# Patient Record
Sex: Female | Born: 1950 | Race: Black or African American | Hispanic: No | Marital: Married | Smoking: Never smoker
Health system: Southern US, Community
[De-identification: ages and names within clinical notes are randomized; demographics above are authoritative.]

## PROBLEM LIST (undated history)

## (undated) DIAGNOSIS — K469 Unspecified abdominal hernia without obstruction or gangrene: Secondary | ICD-10-CM

## (undated) DIAGNOSIS — I10 Essential (primary) hypertension: Secondary | ICD-10-CM

## (undated) DIAGNOSIS — R7303 Prediabetes: Secondary | ICD-10-CM

## (undated) DIAGNOSIS — M1990 Unspecified osteoarthritis, unspecified site: Secondary | ICD-10-CM

## (undated) DIAGNOSIS — E876 Hypokalemia: Secondary | ICD-10-CM

## (undated) DIAGNOSIS — J189 Pneumonia, unspecified organism: Secondary | ICD-10-CM

## (undated) DIAGNOSIS — M2550 Pain in unspecified joint: Secondary | ICD-10-CM

## (undated) DIAGNOSIS — M19019 Primary osteoarthritis, unspecified shoulder: Secondary | ICD-10-CM

## (undated) DIAGNOSIS — K219 Gastro-esophageal reflux disease without esophagitis: Secondary | ICD-10-CM

## (undated) DIAGNOSIS — T7840XA Allergy, unspecified, initial encounter: Secondary | ICD-10-CM

## (undated) DIAGNOSIS — R351 Nocturia: Secondary | ICD-10-CM

## (undated) DIAGNOSIS — E7800 Pure hypercholesterolemia, unspecified: Secondary | ICD-10-CM

## (undated) HISTORY — PX: CHOLECYSTECTOMY: SHX55

## (undated) HISTORY — DX: Pure hypercholesterolemia, unspecified: E78.00

## (undated) HISTORY — PX: TUBAL LIGATION: SHX77

## (undated) HISTORY — DX: Essential (primary) hypertension: I10

## (undated) HISTORY — PX: HERNIA REPAIR: SHX51

## (undated) HISTORY — PX: DILATION AND CURETTAGE OF UTERUS: SHX78

## (undated) HISTORY — DX: Allergy, unspecified, initial encounter: T78.40XA

## (undated) HISTORY — PX: JOINT REPLACEMENT: SHX530

## (undated) HISTORY — PX: COLONOSCOPY: SHX174

---

## 1998-10-20 ENCOUNTER — Other Ambulatory Visit: Admission: RE | Admit: 1998-10-20 | Discharge: 1998-10-20 | Payer: Self-pay | Admitting: Nurse Practitioner

## 2000-07-04 ENCOUNTER — Other Ambulatory Visit: Admission: RE | Admit: 2000-07-04 | Discharge: 2000-07-04 | Payer: Self-pay | Admitting: Endocrinology

## 2000-12-27 ENCOUNTER — Ambulatory Visit (HOSPITAL_COMMUNITY): Admission: RE | Admit: 2000-12-27 | Discharge: 2000-12-27 | Payer: Self-pay | Admitting: Endocrinology

## 2000-12-27 ENCOUNTER — Encounter: Payer: Self-pay | Admitting: Endocrinology

## 2001-08-04 ENCOUNTER — Other Ambulatory Visit: Admission: RE | Admit: 2001-08-04 | Discharge: 2001-08-04 | Payer: Self-pay | Admitting: Endocrinology

## 2002-01-08 ENCOUNTER — Encounter: Payer: Self-pay | Admitting: Endocrinology

## 2002-01-08 ENCOUNTER — Ambulatory Visit (HOSPITAL_COMMUNITY): Admission: RE | Admit: 2002-01-08 | Discharge: 2002-01-08 | Payer: Self-pay | Admitting: Endocrinology

## 2002-10-07 ENCOUNTER — Other Ambulatory Visit: Admission: RE | Admit: 2002-10-07 | Discharge: 2002-10-07 | Payer: Self-pay | Admitting: Endocrinology

## 2003-01-21 ENCOUNTER — Ambulatory Visit (HOSPITAL_COMMUNITY): Admission: RE | Admit: 2003-01-21 | Discharge: 2003-01-21 | Payer: Self-pay | Admitting: Endocrinology

## 2003-01-21 ENCOUNTER — Encounter: Payer: Self-pay | Admitting: Endocrinology

## 2004-01-20 ENCOUNTER — Other Ambulatory Visit: Admission: RE | Admit: 2004-01-20 | Discharge: 2004-01-20 | Payer: Self-pay | Admitting: Obstetrics and Gynecology

## 2004-04-17 ENCOUNTER — Ambulatory Visit: Payer: Self-pay | Admitting: Endocrinology

## 2004-06-28 ENCOUNTER — Ambulatory Visit: Payer: Self-pay | Admitting: Endocrinology

## 2004-12-26 ENCOUNTER — Ambulatory Visit: Payer: Self-pay | Admitting: Endocrinology

## 2005-01-03 ENCOUNTER — Ambulatory Visit: Payer: Self-pay | Admitting: Endocrinology

## 2005-02-09 ENCOUNTER — Other Ambulatory Visit: Admission: RE | Admit: 2005-02-09 | Discharge: 2005-02-09 | Payer: Self-pay | Admitting: Obstetrics and Gynecology

## 2005-04-03 ENCOUNTER — Ambulatory Visit: Payer: Self-pay | Admitting: Endocrinology

## 2005-04-18 ENCOUNTER — Ambulatory Visit: Payer: Self-pay | Admitting: Endocrinology

## 2005-04-23 ENCOUNTER — Ambulatory Visit: Payer: Self-pay | Admitting: Endocrinology

## 2005-05-01 ENCOUNTER — Ambulatory Visit: Payer: Self-pay | Admitting: Internal Medicine

## 2005-05-21 ENCOUNTER — Ambulatory Visit (HOSPITAL_COMMUNITY): Admission: RE | Admit: 2005-05-21 | Discharge: 2005-05-21 | Payer: Self-pay | Admitting: Endocrinology

## 2005-07-27 ENCOUNTER — Ambulatory Visit: Payer: Self-pay | Admitting: Internal Medicine

## 2005-07-31 ENCOUNTER — Ambulatory Visit (HOSPITAL_COMMUNITY): Admission: RE | Admit: 2005-07-31 | Discharge: 2005-07-31 | Payer: Self-pay | Admitting: Internal Medicine

## 2005-10-29 ENCOUNTER — Inpatient Hospital Stay (HOSPITAL_COMMUNITY): Admission: RE | Admit: 2005-10-29 | Discharge: 2005-10-31 | Payer: Self-pay | Admitting: Surgery

## 2005-10-29 ENCOUNTER — Encounter (INDEPENDENT_AMBULATORY_CARE_PROVIDER_SITE_OTHER): Payer: Self-pay | Admitting: *Deleted

## 2005-11-01 ENCOUNTER — Ambulatory Visit: Payer: Self-pay | Admitting: Internal Medicine

## 2006-03-13 ENCOUNTER — Ambulatory Visit: Payer: Self-pay | Admitting: Endocrinology

## 2006-04-15 ENCOUNTER — Ambulatory Visit: Payer: Self-pay | Admitting: Endocrinology

## 2006-05-10 ENCOUNTER — Ambulatory Visit: Payer: Self-pay | Admitting: Endocrinology

## 2006-09-27 ENCOUNTER — Ambulatory Visit: Payer: Self-pay | Admitting: Endocrinology

## 2006-10-14 ENCOUNTER — Ambulatory Visit: Payer: Self-pay | Admitting: Gastroenterology

## 2006-10-28 ENCOUNTER — Ambulatory Visit: Payer: Self-pay | Admitting: Gastroenterology

## 2006-10-28 LAB — HM COLONOSCOPY

## 2007-01-03 ENCOUNTER — Encounter: Payer: Self-pay | Admitting: Endocrinology

## 2007-01-03 DIAGNOSIS — J309 Allergic rhinitis, unspecified: Secondary | ICD-10-CM | POA: Insufficient documentation

## 2007-01-03 DIAGNOSIS — I10 Essential (primary) hypertension: Secondary | ICD-10-CM

## 2007-01-03 HISTORY — DX: Essential (primary) hypertension: I10

## 2007-02-10 LAB — HM MAMMOGRAPHY: HM Mammogram: NORMAL

## 2007-03-21 ENCOUNTER — Ambulatory Visit: Payer: Self-pay | Admitting: Endocrinology

## 2007-03-21 LAB — CONVERTED CEMR LAB
ALT: 26 units/L (ref 0–35)
AST: 30 units/L (ref 0–37)
Albumin: 3.5 g/dL (ref 3.5–5.2)
Alkaline Phosphatase: 57 units/L (ref 39–117)
BUN: 8 mg/dL (ref 6–23)
Basophils Absolute: 0 10*3/uL (ref 0.0–0.1)
Basophils Relative: 0.6 % (ref 0.0–1.0)
Bilirubin Urine: NEGATIVE
Bilirubin, Direct: 0.1 mg/dL (ref 0.0–0.3)
CO2: 32 meq/L (ref 19–32)
Calcium: 9.5 mg/dL (ref 8.4–10.5)
Chloride: 103 meq/L (ref 96–112)
Cholesterol: 199 mg/dL (ref 0–200)
Creatinine, Ser: 0.7 mg/dL (ref 0.4–1.2)
Eosinophils Absolute: 0.1 10*3/uL (ref 0.0–0.6)
Eosinophils Relative: 1.1 % (ref 0.0–5.0)
GFR calc Af Amer: 112 mL/min
GFR calc non Af Amer: 93 mL/min
Glucose, Bld: 98 mg/dL (ref 70–99)
HCT: 37.3 % (ref 36.0–46.0)
HDL: 51.5 mg/dL (ref >39.0)
Hemoglobin, Urine: NEGATIVE
Hemoglobin: 13 g/dL (ref 12.0–15.0)
Hgb A1c MFr Bld: 5.6 % (ref 4.6–6.0)
Ketones, ur: NEGATIVE mg/dL
LDL Cholesterol: 135 mg/dL — ABNORMAL HIGH (ref 0–99)
Leukocytes, UA: NEGATIVE
Lymphocytes Relative: 20.8 % (ref 12.0–46.0)
MCHC: 34.8 g/dL (ref 30.0–36.0)
MCV: 89.7 fL (ref 78.0–100.0)
Monocytes Absolute: 0.4 10*3/uL (ref 0.2–0.7)
Monocytes Relative: 6.4 % (ref 3.0–11.0)
Neutro Abs: 4.6 10*3/uL (ref 1.4–7.7)
Neutrophils Relative %: 71.1 % (ref 43.0–77.0)
Nitrite: NEGATIVE
Platelets: 282 10*3/uL (ref 150–400)
Potassium: 3.3 meq/L — ABNORMAL LOW (ref 3.5–5.1)
RBC: 4.16 M/uL (ref 3.87–5.11)
RDW: 13.2 % (ref 11.5–14.6)
Sodium: 140 meq/L (ref 135–145)
Specific Gravity, Urine: <=1.005 (ref 1.000–1.03)
TSH: 1.27 microintl units/mL (ref 0.35–5.50)
Total Bilirubin: 1 mg/dL (ref 0.3–1.2)
Total CHOL/HDL Ratio: 3.9
Total Protein, Urine: NEGATIVE mg/dL
Total Protein: 7 g/dL (ref 6.0–8.3)
Triglycerides: 62 mg/dL (ref 0–149)
Urine Glucose: NEGATIVE mg/dL
Urobilinogen, UA: 0.2 (ref 0.0–1.0)
VLDL: 12 mg/dL (ref 0–40)
WBC: 6.4 10*3/uL (ref 4.5–10.5)
pH: 7 (ref 5.0–8.0)

## 2007-03-25 ENCOUNTER — Ambulatory Visit: Payer: Self-pay | Admitting: Endocrinology

## 2007-04-23 ENCOUNTER — Telehealth (INDEPENDENT_AMBULATORY_CARE_PROVIDER_SITE_OTHER): Payer: Self-pay | Admitting: *Deleted

## 2007-04-25 ENCOUNTER — Ambulatory Visit: Payer: Self-pay | Admitting: Endocrinology

## 2007-04-25 DIAGNOSIS — E7800 Pure hypercholesterolemia, unspecified: Secondary | ICD-10-CM

## 2007-04-25 DIAGNOSIS — R42 Dizziness and giddiness: Secondary | ICD-10-CM | POA: Insufficient documentation

## 2007-04-25 HISTORY — DX: Pure hypercholesterolemia, unspecified: E78.00

## 2007-05-01 ENCOUNTER — Ambulatory Visit: Payer: Self-pay

## 2007-05-01 ENCOUNTER — Encounter: Payer: Self-pay | Admitting: Endocrinology

## 2007-09-29 ENCOUNTER — Encounter: Payer: Self-pay | Admitting: Endocrinology

## 2007-10-02 ENCOUNTER — Encounter: Payer: Self-pay | Admitting: Endocrinology

## 2007-10-13 ENCOUNTER — Encounter: Payer: Self-pay | Admitting: Endocrinology

## 2007-10-30 ENCOUNTER — Encounter: Payer: Self-pay | Admitting: Endocrinology

## 2008-03-09 ENCOUNTER — Encounter: Payer: Self-pay | Admitting: Endocrinology

## 2008-04-13 ENCOUNTER — Telehealth (INDEPENDENT_AMBULATORY_CARE_PROVIDER_SITE_OTHER): Payer: Self-pay | Admitting: *Deleted

## 2008-06-11 HISTORY — PX: REPLACEMENT TOTAL KNEE BILATERAL: SUR1225

## 2008-08-06 ENCOUNTER — Ambulatory Visit: Payer: Self-pay | Admitting: Endocrinology

## 2008-08-08 LAB — CONVERTED CEMR LAB
ALT: 20 units/L (ref 0–35)
AST: 24 units/L (ref 0–37)
Albumin: 3.6 g/dL (ref 3.5–5.2)
Alkaline Phosphatase: 59 units/L (ref 39–117)
BUN: 8 mg/dL (ref 6–23)
Basophils Absolute: 0.1 10*3/uL (ref 0.0–0.1)
Basophils Relative: 0.8 % (ref 0.0–3.0)
Bilirubin Urine: NEGATIVE
Bilirubin, Direct: 0.2 mg/dL (ref 0.0–0.3)
CO2: 30 meq/L (ref 19–32)
Calcium: 9.4 mg/dL (ref 8.4–10.5)
Chloride: 103 meq/L (ref 96–112)
Cholesterol: 161 mg/dL (ref 0–200)
Creatinine, Ser: 0.8 mg/dL (ref 0.4–1.2)
Eosinophils Absolute: 0.1 10*3/uL (ref 0.0–0.7)
Eosinophils Relative: 1.9 % (ref 0.0–5.0)
GFR calc Af Amer: 95 mL/min
GFR calc non Af Amer: 79 mL/min
Glucose, Bld: 112 mg/dL — ABNORMAL HIGH (ref 70–99)
HCT: 37.8 % (ref 36.0–46.0)
HDL: 59 mg/dL (ref >39.0)
Hemoglobin, Urine: NEGATIVE
Hemoglobin: 12.9 g/dL (ref 12.0–15.0)
Ketones, ur: NEGATIVE mg/dL
LDL Cholesterol: 91 mg/dL (ref 0–99)
Leukocytes, UA: NEGATIVE
Lymphocytes Relative: 18.4 % (ref 12.0–46.0)
MCHC: 34.1 g/dL (ref 30.0–36.0)
MCV: 86.9 fL (ref 78.0–100.0)
Monocytes Absolute: 0.5 10*3/uL (ref 0.1–1.0)
Monocytes Relative: 7.2 % (ref 3.0–12.0)
Neutro Abs: 4.4 10*3/uL (ref 1.4–7.7)
Neutrophils Relative %: 71.7 % (ref 43.0–77.0)
Nitrite: NEGATIVE
Platelets: 237 10*3/uL (ref 150–400)
Potassium: 3.7 meq/L (ref 3.5–5.1)
RBC: 4.35 M/uL (ref 3.87–5.11)
RDW: 15.1 % — ABNORMAL HIGH (ref 11.5–14.6)
Sodium: 139 meq/L (ref 135–145)
Specific Gravity, Urine: <=1.005 (ref 1.000–1.035)
TSH: 1.52 microintl units/mL (ref 0.35–5.50)
Total Bilirubin: 1 mg/dL (ref 0.3–1.2)
Total CHOL/HDL Ratio: 2.7
Total Protein, Urine: NEGATIVE mg/dL
Total Protein: 7.1 g/dL (ref 6.0–8.3)
Triglycerides: 53 mg/dL (ref 0–149)
Urine Glucose: NEGATIVE mg/dL
Urobilinogen, UA: 0.2 (ref 0.0–1.0)
VLDL: 11 mg/dL (ref 0–40)
WBC: 6.3 10*3/uL (ref 4.5–10.5)
pH: 6.5 (ref 5.0–8.0)

## 2008-08-12 ENCOUNTER — Ambulatory Visit: Payer: Self-pay | Admitting: Endocrinology

## 2008-08-12 DIAGNOSIS — R7309 Other abnormal glucose: Secondary | ICD-10-CM | POA: Insufficient documentation

## 2008-10-29 ENCOUNTER — Telehealth (INDEPENDENT_AMBULATORY_CARE_PROVIDER_SITE_OTHER): Payer: Self-pay | Admitting: *Deleted

## 2009-04-11 LAB — CONVERTED CEMR LAB

## 2009-04-11 LAB — HM MAMMOGRAPHY

## 2009-04-19 ENCOUNTER — Encounter: Admission: RE | Admit: 2009-04-19 | Discharge: 2009-04-19 | Payer: Self-pay | Admitting: Obstetrics and Gynecology

## 2009-06-11 LAB — HM MAMMOGRAPHY: HM Mammogram: NORMAL

## 2009-07-12 ENCOUNTER — Ambulatory Visit: Payer: Self-pay | Admitting: Endocrinology

## 2009-12-27 ENCOUNTER — Telehealth: Payer: Self-pay | Admitting: Endocrinology

## 2010-01-13 ENCOUNTER — Ambulatory Visit: Payer: Self-pay | Admitting: Endocrinology

## 2010-01-13 LAB — CONVERTED CEMR LAB
ALT: 18 units/L (ref 0–35)
AST: 22 units/L (ref 0–37)
Albumin: 3.8 g/dL (ref 3.5–5.2)
Alkaline Phosphatase: 75 units/L (ref 39–117)
BUN: 10 mg/dL (ref 6–23)
Basophils Absolute: 0 10*3/uL (ref 0.0–0.1)
Basophils Relative: 0.5 % (ref 0.0–3.0)
Bilirubin Urine: NEGATIVE
Bilirubin, Direct: 0.1 mg/dL (ref 0.0–0.3)
CO2: 32 meq/L (ref 19–32)
Calcium: 9.7 mg/dL (ref 8.4–10.5)
Chloride: 105 meq/L (ref 96–112)
Cholesterol: 173 mg/dL (ref 0–200)
Creatinine, Ser: 0.6 mg/dL (ref 0.4–1.2)
Eosinophils Absolute: 0.1 10*3/uL (ref 0.0–0.7)
Eosinophils Relative: 2.1 % (ref 0.0–5.0)
GFR calc non Af Amer: 122.8 mL/min (ref >60)
Glucose, Bld: 107 mg/dL — ABNORMAL HIGH (ref 70–99)
HCT: 41.8 % (ref 36.0–46.0)
HDL: 58.4 mg/dL (ref >39.00)
Hemoglobin, Urine: NEGATIVE
Hemoglobin: 14.4 g/dL (ref 12.0–15.0)
Ketones, ur: NEGATIVE mg/dL
LDL Cholesterol: 96 mg/dL (ref 0–99)
Leukocytes, UA: NEGATIVE
Lymphocytes Relative: 26.6 % (ref 12.0–46.0)
Lymphs Abs: 1.6 10*3/uL (ref 0.7–4.0)
MCHC: 34.3 g/dL (ref 30.0–36.0)
MCV: 92.4 fL (ref 78.0–100.0)
Monocytes Absolute: 0.5 10*3/uL (ref 0.1–1.0)
Monocytes Relative: 7.9 % (ref 3.0–12.0)
Neutro Abs: 3.8 10*3/uL (ref 1.4–7.7)
Neutrophils Relative %: 62.9 % (ref 43.0–77.0)
Nitrite: NEGATIVE
PSA: 0 ng/mL — ABNORMAL LOW (ref 0.10–4.00)
Platelets: 225 10*3/uL (ref 150.0–400.0)
Potassium: 4 meq/L (ref 3.5–5.1)
RBC: 4.53 M/uL (ref 3.87–5.11)
RDW: 13.4 % (ref 11.5–14.6)
Sodium: 144 meq/L (ref 135–145)
Specific Gravity, Urine: 1.02 (ref 1.000–1.030)
TSH: 1.77 microintl units/mL (ref 0.35–5.50)
Total Bilirubin: 1.2 mg/dL (ref 0.3–1.2)
Total CHOL/HDL Ratio: 3
Total Protein, Urine: NEGATIVE mg/dL
Total Protein: 7.1 g/dL (ref 6.0–8.3)
Triglycerides: 93 mg/dL (ref 0.0–149.0)
Urine Glucose: NEGATIVE mg/dL
Urobilinogen, UA: 0.2 (ref 0.0–1.0)
VLDL: 18.6 mg/dL (ref 0.0–40.0)
WBC: 6 10*3/uL (ref 4.5–10.5)
pH: 6.5 (ref 5.0–8.0)

## 2010-01-16 ENCOUNTER — Ambulatory Visit: Payer: Self-pay | Admitting: Endocrinology

## 2010-01-16 ENCOUNTER — Encounter: Payer: Self-pay | Admitting: Endocrinology

## 2010-05-09 ENCOUNTER — Encounter: Admission: RE | Admit: 2010-05-09 | Discharge: 2010-05-09 | Payer: Self-pay | Admitting: Obstetrics and Gynecology

## 2010-07-02 ENCOUNTER — Encounter: Payer: Self-pay | Admitting: Obstetrics and Gynecology

## 2010-07-09 LAB — CONVERTED CEMR LAB
ALT: 25 units/L (ref 0–35)
AST: 28 units/L (ref 0–37)
Albumin: 3.6 g/dL (ref 3.5–5.2)
Alkaline Phosphatase: 56 units/L (ref 39–117)
Bilirubin, Direct: 0.1 mg/dL (ref 0.0–0.3)
Cholesterol: 150 mg/dL (ref 0–200)
HDL: 49.7 mg/dL (ref >39.0)
LDL Cholesterol: 83 mg/dL (ref 0–99)
Pap Smear: NORMAL
Pap Smear: NORMAL
Sed Rate: 18 mm/hr (ref 0–25)
Total Bilirubin: 0.7 mg/dL (ref 0.3–1.2)
Total CHOL/HDL Ratio: 3
Total Protein: 7.4 g/dL (ref 6.0–8.3)
Triglycerides: 87 mg/dL (ref 0–149)
Uric Acid, Serum: 7.2 mg/dL — ABNORMAL HIGH (ref 2.4–7.0)
VLDL: 17 mg/dL (ref 0–40)

## 2010-07-11 NOTE — Assessment & Plan Note (Signed)
Summary: SINUS INFECTION / NWS #   Vital Signs:  Patient profile:   60 year old female Height:      64 inches (162.56 cm) Weight:      217 pounds (98.64 kg) BMI:     37.38 O2 Sat:      97 % on Room air Temp:     96.6 degrees F (35.89 degrees C) oral Pulse rate:   85 / minute BP sitting:   112 / 72  (left arm) Cuff size:   large  Vitals Entered By: Josph Macho CMA (July 12, 2009 8:02 AM)  O2 Flow:  Room air CC: Sinus infection X2weeks/ CF Is Patient Diabetic? No   CC:  Sinus infection X2weeks/ CF.  History of Present Illness: pt states 2 weeks of nasal congestion, and associated prod-quality cough.  she denies associated pain at the ears.    Current Medications (verified): 1)  Nasonex 50 Mcg/act  Susp (Mometasone Furoate) .... Use 2 Spray in Each Nostril 2)  Pravachol 40 Mg  Tabs (Pravastatin Sodium) .... Take 1 By Mouth Qhs 3)  Norvasc 2.5 Mg  Tabs (Amlodipine Besylate) .... Qd 4)  Hyzaar 100-25 Mg Tabs (Losartan Potassium-Hctz) .... Qd  Allergies (verified): No Known Drug Allergies  Past History:  Past Medical History: Last updated: 01/03/2007 Allergic rhinitis Hypertension Cough due to accupril Dyslipidemia ( Rx refused) Hyperglycemia  Review of Systems  The patient denies fever.    Physical Exam  General:  normal appearance.     Impression & Recommendations:  Problem # 1:  URI (ICD-465.9) Assessment New  Medications Added to Medication List This Visit: 1)  Azithromycin 500 Mg Tabs (Azithromycin) .Marland Kitchen.. 1 qd 2)  Benzonatate 100 Mg Caps (Benzonatate) .Marland Kitchen.. 1 tab three times a day as needed cough  Other Orders: Est. Patient Level III (84696)  Patient Instructions: 1)  azithromycin 500 mg once daily 2)  loratadine-d (non-prescription) as needed for congestion 3)  benzonatate 100 mg three times a day as needed cough. 4)  physical in 2 months Prescriptions: BENZONATATE 100 MG CAPS (BENZONATATE) 1 tab three times a day as needed cough  #30 x  1   Entered and Authorized by:   Minus Breeding MD   Signed by:   Minus Breeding MD on 07/12/2009   Method used:   [facility] [address] [phone][facility] (retail)       [address]       [place], [place]  [address]       Ph: [phone] or [phone]       Fax: [phone]   RxID:   [id number] AZITHROMYCIN 500 MG TABS (AZITHROMYCIN) 1 qd  #6 x 0   Entered and Authorized by:   Minus Breeding MD   Signed by:   Minus Breeding MD on 07/12/2009   Method used:   [facility]        [facility] [address] [phone]* (retail)       [address]       [place], [place]  [address]       Ph: [phone] or [phone]       Fax: [phone]   RxID:   [id number]   Preventive Care Screening  Mammogram:    Date:  04/11/2009    Results:  historical   Pap Smear:  Date:  04/11/2009    Results:  historical

## 2010-07-11 NOTE — Assessment & Plan Note (Signed)
Summary: CPX/BCBS/#/CD   Vital Signs:  Patient profile:   60 year old female Height:      64 inches (162.56 cm) Weight:      214 pounds (97.27 kg) BMI:     36.87 O2 Sat:      97 % on Room air Temp:     97.5 degrees F (36.39 degrees C) oral Pulse rate:   69 / minute BP sitting:   122 / 76  (left arm) Cuff size:   large  Vitals Entered By: Brenton Grills MA (January 16, 2010 10:03 AM)  O2 Flow:  Room air CC: Physical/pt is no longer taking Azithromycin or Benzonatate/aj   CC:  Physical/pt is no longer taking Azithromycin or Benzonatate/aj.  History of Present Illness: here for regular wellness examination.  she's feeling pretty well in general, and does not drink or smoke.   Current Medications (verified): 1)  Nasonex 50 Mcg/act  Susp (Mometasone Furoate) .... Use 2 Spray in Each Nostril 2)  Pravachol 40 Mg  Tabs (Pravastatin Sodium) .... Take 1 By Mouth Qhs 3)  Norvasc 2.5 Mg  Tabs (Amlodipine Besylate) .... Qd 4)  Hyzaar 100-25 Mg Tabs (Losartan Potassium-Hctz) .... Qd 5)  Azithromycin 500 Mg Tabs (Azithromycin) .Marland Kitchen.. 1 Qd 6)  Benzonatate 100 Mg Caps (Benzonatate) .Marland Kitchen.. 1 Tab Three Times A Day As Needed Cough  Allergies (verified): No Known Drug Allergies  Family History: Reviewed history from 08/12/2008 and no changes required. no cancer  Social History: Reviewed history from 08/12/2008 and no changes required. married beautician  Review of Systems  The patient denies fever, weight loss, weight gain, vision loss, chest pain, syncope, dyspnea on exertion, prolonged cough, headaches, abdominal pain, melena, hematochezia, severe indigestion/heartburn, hematuria, suspicious skin lesions, and depression.    Physical Exam  General:  normal appearance.   Head:  head: no deformity eyes: no periorbital swelling, no proptosis external nose and ears are normal mouth: no lesion seen Neck:  Supple without thyroid enlargement or tenderness.  Breasts:  sees gyn  Lungs:   Clear to auscultation bilaterally. Normal respiratory effort.  Heart:  Regular rate and rhythm without murmurs or gallops noted. Normal S1,S2.   Abdomen:  abdomen is soft, nontender.  no hepatosplenomegaly.   not distended.  no hernia  Rectal:  sees gyn  Genitalia:  sees gyn  Msk:  muscle bulk and strength are grossly normal.  no obvious joint swelling.  gait is normal and steady  Pulses:  dorsalis pedis intact bilat.  no carotid bruit  Extremities:  no deformity.  no ulcer on the feet.  feet are of normal color and temp.  no edema  Neurologic:  cn 2-12 grossly intact.   readily moves all 4's.   sensation is intact to touch on the feet  Skin:  normal texture and temp.  no rash.  not diaphoretic  Cervical Nodes:  No significant adenopathy.  Psych:  Alert and cooperative; normal mood and affect; normal attention span and concentration.   Additional Exam:  SEPARATE EVALUATION FOLLOWS--EACH PROBLEM HERE IS NEW, NOT RESPONDING TO TREATMENT, OR POSES SIGNIFICANT RISK TO THE PATIENT'S HEALTH: HISTORY OF THE PRESENT ILLNESS: pt states few days of slightly decreased hearing from the right ear.  no assoc pain PAST MEDICAL HISTORY reviewed and up to date today REVIEW OF SYSTEMS: denies left ear decreased hearing PHYSICAL EXAMINATION: right eac is impacted with cerumen IMPRESSION: right eac cerumen impaction PLAN: right ear is irrigated.  repeat exam shows resolution of  the impaction.    Impression & Recommendations:  Problem # 1:  ROUTINE GENERAL MEDICAL EXAM@HEALTH  CARE FACL (ICD-V70.0)  Other Orders: EKG w/ Interpretation (93000) Est. Patient 40-64 years ([address])  Patient Instructions: 1)  please consider these measures for your health:  minimize alcohol.  do not use tobacco products.  have a colonoscopy at least every 10 years from age 63.  keep firearms safely stored.  always use seat belts.  have working smoke alarms in your home.  see an eye doctor and dentist regularly.   never drive under the influence of alcohol or drugs (including prescription drugs).  2)  please let me know what your wishes would be, if artificial life support measures should become necessary.  it is critically important to prevent falling down (keep floor areas well-lit, dry, and free of loose objects) 3)  (update:  we discussed code status.  pt requests full code, but would not want to be started or maintained on artificial life-support measures if there was not a reasonable chance of recovery) Prescriptions: PRAVACHOL 40 MG  TABS (PRAVASTATIN SODIUM) TAKE 1 by mouth QHS  #90 Tablet x 3   Entered and Authorized by:   Minus Breeding MD   Signed by:   Minus Breeding MD on 01/16/2010   Method used:   [facility]        [facility] [address] [phone]* (retail)       [address]       [place], [place]  [address]       Ph: [phone] or [phone]       Fax: [phone]   RxID:   [id number]    Preventive Care Screening  Mammogram:    Date:  06/11/2009    Results:  normal   Pap Smear:    Date:  06/11/2009    Results:  normal      gyn is dr Edward Jolly

## 2010-07-11 NOTE — Miscellaneous (Signed)
Summary: PRAVASTATIN   Clinical Lists Changes  Medications: Rx of PRAVACHOL 40 MG  TABS (PRAVASTATIN SODIUM) TAKE 1 by mouth QHS;  #90 x 2;  Signed;  Entered by: Orlan Leavens;  Authorized by: Minus Breeding MD;  Method used: Electronic    Prescriptions: PRAVACHOL 40 MG  TABS (PRAVASTATIN SODIUM) TAKE 1 by mouth QHS  #90 x 2   Entered by:   Orlan Leavens   Authorized by:   Minus Breeding MD   Signed by:   Orlan Leavens on 10/30/2007   Method used:   Electronically sent to ...       [facility]. [phone]*       [address]       [place], [place]  [address]       Ph: [phone] or [phone]       Fax: [phone]   RxID:   [id number]

## 2010-07-11 NOTE — Assessment & Plan Note (Signed)
Summary: cpx/bcbs/cd   Vital Signs:  Patient Profile:   60 Years Old Female Weight:      206.4 pounds O2 Sat:      98 % O2 treatment:    Room Air Temp:     98 degrees F oral Pulse rate:   60 / minute BP sitting:   118 / 82  (left arm) Cuff size:   large  Pt. in pain?   no  Vitals Entered By: Iona Hansen CMA (August 12, 2008 9:33 AM)                Last PAP Date 02/10/2007   Chief Complaint:  CPX.  History of Present Illness: here for regular wellness examination.  she's feeling pretty well in general, and does not drink or smoke.  she wants a cheaper bp med.     Prior Medications Reviewed Using: Patient Recall  Prior Medication List:  NASONEX 50 MCG/ACT  SUSP (MOMETASONE FUROATE) USE 2 SPRAY IN EACH NOSTRIL BENICAR HCT 40-25 MG  TABS (OLMESARTAN MEDOXOMIL-HCTZ) TAKE 1 by mouth once daily Physical is over due no addtional refills until appt PRAVACHOL 40 MG  TABS (PRAVASTATIN SODIUM) TAKE 1 by mouth QHS NORVASC 2.5 MG  TABS (AMLODIPINE BESYLATE) qd   Updated Prior Medication List: NASONEX 50 MCG/ACT  SUSP (MOMETASONE FUROATE) USE 2 SPRAY IN EACH NOSTRIL BENICAR HCT 40-25 MG  TABS (OLMESARTAN MEDOXOMIL-HCTZ) TAKE 1 by mouth once daily Physical is over due no addtional refills until appt PRAVACHOL 40 MG  TABS (PRAVASTATIN SODIUM) TAKE 1 by mouth QHS NORVASC 2.5 MG  TABS (AMLODIPINE BESYLATE) qd  Current Allergies (reviewed today): No known allergies   Past Medical History:    Reviewed history from 01/03/2007 and no changes required:       Allergic rhinitis       Hypertension       Cough due to accupril       Dyslipidemia ( Rx refused)       Hyperglycemia   Family History:    Reviewed history and no changes required:       no cancer  Social History:    Reviewed history and no changes required:       married       beautician   Risk Factors:  PAP Smear History:     Date of Last PAP Smear:  08/12/2008    Results:  Normal  Mammogram History:     Date  of Last Mammogram:  02/10/2007    Results:  Normal Bilateral    Review of Systems  The patient denies fever, weight loss, weight gain, vision loss, decreased hearing, chest pain, syncope, dyspnea on exertion, prolonged cough, headaches, abdominal pain, melena, hematochezia, severe indigestion/heartburn, hematuria, suspicious skin lesions, and depression.     Physical Exam  General:     well developed, well nourished, in no acute distress Head:     head: no deformity eyes: no periorbital swelling, no proptosis external nose and ears are normal mouth: no lesion seen  Neck:     no masses, thyromegaly, or abnormal cervical nodes Breasts:     sees gyn  Lungs:     clear bilaterally to A  Heart:     regular rate and rhythm, S1, S2 without murmurs, rubs, gallops, or clicks Abdomen:     abdomen is soft, nontender.  no hepatosplenomegaly.   not distended.  no hernia  Rectal:     sees gyn  Genitalia:  sees gyn  Msk:     no deformity or scoliosis noted with normal posture and gait Pulses:     dorsalis pedis intact bilat.  no carotid bruit  Extremities:     no deformity.  no ulcer on the feet.  feet are of normal color and temp.  no edema there is a bandage on the right great toenail, where pt recently had a toenail removed  Neurologic:     cn 2-12 grossly intact.   readily moves all 4's.   sensation is intact to touch on the feet  Skin:     normal texture and temp.  no rash.  not diaphoretic  Cervical Nodes:     no significant adenopathy Psych:     alert and cooperative; normal mood and affect; normal attention span and concentration    Impression & Recommendations:  Problem # 1:  Preventive Health Care (ICD-V70.0)  Problem # 2:  HYPERTENSION (ICD-401.9) wants cheaper med  Medications Added to Medication List This Visit: 1)  Hyzaar 100-25 Mg Tabs (Losartan potassium-hctz) .... Qd  Other Orders: Est. Patient 40-64 years ([address])   Patient  Instructions: 1)  we discussed the recommendations of the preventive services task force 2)  change benicar-hct to hyzaar 100/25 1/day   Prescriptions: NORVASC 2.5 MG  TABS (AMLODIPINE BESYLATE) qd  #90 Tablet x 3   Entered and Authorized by:   Minus Breeding MD   Signed by:   Minus Breeding MD on 08/12/2008   Method used:   [facility]        [facility] [address] [phone]* (retail)       [address]       [place], [place]  [address]       Ph: [phone] or [phone]       Fax: [phone]   RxID:   [id number]    Preventive Care Screening     gyn is dr Edward Jolly, who does mammography and dexa

## 2010-07-11 NOTE — Progress Notes (Signed)
  Phone Note Call from Patient Call back at Home Phone [phone]   Caller: Patient Call For: Minus Breeding MD Summary of Call: per Sudie Grumbling  call onn 3-4 pt states dr Lorane Gell was going to send in a rx for change benicar-hct to hyzaar 100/25 1/day.Marland KitchenMarland KitchenMarland KitchenNeed chart ASAP/STAT 90 day supply [facility]. [phone]* (retail) [address] [place], [place]  [address] Ph: [phone] or [phone] Fax: [phone] Initial call taken by: Shelbie Proctor,  Oct 29, 2008 4:47 PM  Follow-up for Phone Call        sent Follow-up by: Minus Breeding MD,  Oct 29, 2008 4:53 PM  Additional Follow-up for Phone Call Additional follow up Details #1::        CALLED PT TO INFORM PT MADE AWARE Additional Follow-up by: Shelbie Proctor,  Oct 29, 2008 4:56 PM      Prescriptions: Mauri Reading 100-25 MG TABS (LOSARTAN POTASSIUM-HCTZ) qd  #90 x 3   Entered and Authorized by:   Minus Breeding MD   Signed by:   Minus Breeding MD on 10/29/2008   Method used:   [facility]ically [facility]        [facility] [address] [phone]* (retail)       [address]       [place], [place]  [address]       Ph: [phone] or [phone]       Fax: [phone]   RxID:   [id number]

## 2010-07-11 NOTE — Miscellaneous (Signed)
  Clinical Lists Changes  Medications: Rx of NORVASC 2.5 MG  TABS (AMLODIPINE BESYLATE) qd;  #90 x 0;  Signed;  Entered by: Maris Berger;  Authorized by: Minus Breeding MD;  Method used: Printed then faxed to [facility]. [phone]*, [address], [place], [place], [place]  [address], Ph: [phone] or [phone], Fax: [phone]    Prescriptions: NORVASC 2.5 MG  TABS (AMLODIPINE BESYLATE) qd  #90 x 0   Entered by:   Maris Berger   Authorized by:   Minus Breeding MD   Signed by:   Maris Berger on 09/29/2007   Method used:   Printed then faxed to ...       [facility]. [phone]*       [address]       [place], [place]  [address]       Ph: [phone] or [phone]       Fax: [phone]   RxID:   [id number]

## 2010-07-11 NOTE — Assessment & Plan Note (Signed)
Summary: tingling in feet weak and dizzness/ok to add on per dr Herbert Seta...   Vital Signs:  Patient Profile:   61 Years Old Female Weight:      193.38 pounds Temp:     97.8 degrees F oral Pulse rate:   77 / minute BP sitting:   117 / 77  (right arm) Cuff size:   large  Vitals Entered By: Orlan Leavens (April 25, 2007 11:04 AM)                 Visit Type:  Acute Visit   History of Present Illness: patient states one to two weeks of intermittent mild to moderate non-vertiginous quality lightheadedness.  She does them is some associated tingling of the hands and feet.  She is unable to cite any precipitating factor such as time of day.   Vital Signs:  Patient Profile:   60 Years Old Female Weight:      193.38 pounds Temp:     97.8 degrees F oral Pulse rate:   77 / minute BP sitting:   117 / 77 Cuff size:   large   Review of Systems  The patient denies fever and syncope.    Current Allergies: No known allergies   Past Medical History:    Reviewed history from 01/03/2007 and no changes required:       Allergic rhinitis       Hypertension       Cough due to accupril       Dyslipidemia ( Rx refused)       Hyperglycemia     Review of Systems  The patient denies fever and syncope.     Physical Exam  General:     healthy appearing.   Eyes:     PERRLA/EOM intact; conjunctiva and sclera clear Mouth:     no deformity or lesions with good dentition Neck:     no masses, thyromegaly, or abnormal cervical nodes Lungs:     clear bilaterally to A Heart:     regular rate and rhythm, S1, S2 without murmurs, rubs, gallops, or clicks Msk:     gait is normal Pulses:     no carotid bruit Neurologic:     no focal deficits, CN II-XII intact with normal reflexes, coordination, muscle strength and tone Skin:     not diaphoretic Psych:     alert and cooperative; normal mood and affect; normal attention span and concentration    Impression &  Recommendations:  Problem # 1:  DIZZINESS AND GIDDINESS (ICD-780.4) uncertain etiology Orders: EKG w/ Interpretation (93000) Cardiology Referral (Cardiology) Est. Patient Level IV ([address])   Medications Added to Medication List This Visit: 1)  Pravachol 40 Mg Tabs (Pravastatin sodium) .... Take 1 by mouth qhs 2)  Norvasc 2.5 Mg Tabs (Amlodipine besylate) .... Qd   Patient Instructions: 1)  change procardia to norvasc 2.5/d 2)  check cardiac nuc study 3)  call if sxs persist    Prescriptions: PRAVACHOL 40 MG  TABS (PRAVASTATIN SODIUM) TAKE 1 by mouth QHS  #30 x 11   Entered and Authorized by:   Minus Breeding MD   Signed by:   Minus Breeding MD on 04/25/2007   Method used:   Electronically sent to ...       [facility] [phone]*       [address]       [place], [place]  [address]  Ph: [phone] or [phone]       Fax: [phone]   RxID:   [id number] NORVASC 2.5 MG  TABS (AMLODIPINE BESYLATE) qd  #30 x 11   Entered and Authorized by:   Minus Breeding MD   Signed by:   Minus Breeding MD on 04/25/2007   Method used:   Electronically sent to ...       [facility]  [facility]. [phone]*       [address]       [place], [place]  [address]       Ph: [phone] or [phone]       Fax: [phone]   RxID:   [id number]  ]

## 2010-07-11 NOTE — Miscellaneous (Signed)
Summary: benicar   Clinical Lists Changes  Medications: Rx of BENICAR HCT 40-25 MG  TABS (OLMESARTAN MEDOXOMIL-HCTZ) TAKE 1 by mouth QD;  #90 x 2;  Signed;  Entered by: Orlan Leavens;  Authorized by: Minus Breeding MD;  Method used: Electronic    Prescriptions: BENICAR HCT 40-25 MG  TABS (OLMESARTAN MEDOXOMIL-HCTZ) TAKE 1 by mouth QD  #90 x 2   Entered by:   Orlan Leavens   Authorized by:   Minus Breeding MD   Signed by:   Orlan Leavens on 10/13/2007   Method used:   Electronically sent to ...       [facility]. [phone]*       [address]       [place], [place]  [address]       Ph: [phone] or [phone]       Fax: [phone]   RxID:   [id number]

## 2010-07-11 NOTE — Progress Notes (Signed)
Summary: CPX due  Phone Note Outgoing Call   Call placed by: Brenton Grills MA,  December 27, 2009 3:50 PM Call placed to: Patient Details for Reason: CPX due Summary of Call: Per MD, pt is due for an Physical. called and informed pt  Follow-up for Phone Call        Appointment scheduled for 01/16/2010 at 10:00am Follow-up by: Brenton Grills MA,  December 27, 2009 3:52 PM

## 2010-07-11 NOTE — Progress Notes (Signed)
  Phone Note Call from Patient Call back at Home Phone [phone]   Caller: Patient Call For: Minus Breeding MD Summary of Call: per [facility] call want a rx for a muscle relaxer...had a knee surgery   [facility]  [address] [phone]* (retail) [address] [place], [place]  [address] Ph: [phone] or [phone] Fax: [phone] Initial call taken by: Shelbie Proctor,  April 13, 2008 4:05 PM  Follow-up for Phone Call        options: ask the dr who did the surgery ov here tomorrow Follow-up by: Minus Breeding MD,  April 13, 2008 4:12 PM  Additional Follow-up for Phone Call Additional follow up Details #1::        called pt to inform pt stated thats ok she did not need one now Additional Follow-up by: Shelbie Proctor,  April 13, 2008 4:37 PM

## 2010-10-27 NOTE — Op Note (Signed)
[name], Angela Estrada           ACCOUNT NO.:  [id number]   MEDICAL RECORD NO.:  [id number]          PATIENT TYPE:  INP   LOCATION:  1409                         FACILITY:  [facility]   PHYSICIAN:  Lina Sar, M.D. [facility]  DATE OF BIRTH:  05/10/1951   DATE OF PROCEDURE:  DATE OF DISCHARGE:                                 OPERATIVE REPORT   PROCEDURE:  Endoscopic retrograde cholangiopancreatography.   INDICATIONS:  This 60 year old African-American female presented as an  emergency with acute abdominal pain, abnormal liver function tests, and  cholelithiasis, which was symptomatic.  Intraoperative cholangiogram showed  failure of the contrast material to reach the duodenum.  There was some  contrast collection outside of the bile duct.  The distal common bile duct  was mildly dilated.  The distal common duct would not be visualized fully.  There was a question of possibly a stone; therefore, she is undergoing the  ERCP to visualize the distal common bile duct and to rule out  choledocholithiasis.   ENDOSCOPE:  Olympus __________  side-viewing duodenoscope.   SEDATION:  Versed 8 mg IV, fentanyl 100 mcg IV, and glucagon 0.5 mg IV.   FINDINGS:  Olympus __________  side-viewing duodenoscope was passed blindly  through the esophagus, into the stomach, through the pyloric channel, into  the duodenum.  The papilla was visualized without difficulty.  It showed  normal size and configuration.  Bile was exuding from the papilla.  Initially, the main pancreatic duct was cannulated and showed normal cords  in the head, tail, and body of the pancreas.  Next, the common bile duct was  visualized initially by cannulating the bile duct to the guidewire and then  proceeding with a cholangiogram.  The size of the bile duct was about 1 cm.  It was upper limits of normal.  Both hepatic ducts and the intrahepatic  radicals appeared normal.  The contrast filled up and emptied out of the  common bile duct  freely.  There was no obstruction.  There were no stones  seen.  Fluoroscopic guidance was provided by Dr. Anselmo Pickler.  Cystic duct  was visualized with the surgical clips.  No evidence of retained stones or  stricture.   IMPRESSION:  1.  Normal endoscopic cholangiopancreatography.  2.  Status post cholecystectomy.  No evidence of retained stones.   PLAN:  Routine orders have been written.  Patient will resume diet.  Will  follow the liver function tests until resolution.      Lina Sar, M.D. [facility]  Electronically Signed     DB/MEDQ  D:  10/30/2005  T:  10/30/2005  Job:  [id number]   cc:   Thomas A. Cornett, M.D.  [address] [place] [address]  [place] [address]

## 2010-10-27 NOTE — Op Note (Signed)
NAMERASHELLE, IRELAND           ACCOUNT NO.:  [id number]   MEDICAL RECORD NO.:  [id number]          PATIENT TYPE:  OIB   LOCATION:  1409                         FACILITY:  [facility]   PHYSICIAN:  Thomas A. Cornett, M.D.DATE OF BIRTH:  July 31, 1950   DATE OF PROCEDURE:  10/29/2005  DATE OF DISCHARGE:                                 OPERATIVE REPORT   PREOPERATIVE DIAGNOSIS:  Symptomatic cholelithiasis.   POSTOPERATIVE DIAGNOSIS:  Symptomatic cholelithiasis plus common bile duct  stone.   OPERATION PERFORMED:  Laparoscopic cholecystectomy with intraoperative  cholangiogram.   SURGEON:  Thomas A. Cornett, M.D.   ASSISTANT:  Sheppard Plumber. Earlene Plater, M.D.   ANESTHESIA:  General endotracheal with 8 mL of 0.5% Sensorcaine local.   ESTIMATED BLOOD LOSS:  20 mL.   DRAINS:  1 Blake drain 36 French to gallbladder fossa.   SPECIMENS:  Gallbladder to pathology.   INDICATIONS FOR PROCEDURE:  The patient is a 60 year old female who has had  persistent right upper quadrant pain and nausea.  She was found to have  cholelithiasis and a HIDA scan which showed nonvisualization of the  gallbladder.  I felt she had symptomatic cholelithiasis with possible  chronic cholecystitis and recommended laparoscopic cholecystectomy to her  for her problem.  The procedure was explained to her as well as potential  complications.  She understood and agreed to proceed.   DESCRIPTION OF PROCEDURE:  The patient was brought to the operating room and  placed supine.  After the induction of general endotracheal anesthesia, the  abdomen was prepped and draped in a sterile fashion.  A 1 cm supraumbilical  incision was made.  Dissection was carried down her fascia and a small  incision was made in the fascia.  I placed a Kelly clamp through the  preperitoneal space into the peritoneal cavity without difficulty.  I placed  my finger in and swept around and felt no adhesions.  A pursestring suture  of 0 Vicryl was placed and  a 12 mm Hasson cannula was placed under direct  vision.  Pneumoperitoneum was created with 15 mmHg of CO2 and a laparoscope  was placed.  The patient was placed in reversed Trendelenburg position and  rolled to her left.  No signs of hollow or solid organ injury with the  insertion of this trocar.  Next, an 11 mm subxiphoid port was placed under  direct vision.  Two 5 mm ports were placed both in the right midabdomen  under direct vision with local anesthesia.  The gallbladder was identified  and grasped by its dome and retracted towards the patient's right shoulder.  A second grasper was used to grab the infundibulum of the gallbladder and  pull it toward the patient's right lower quadrant.  Dissection was begun at  the junction of the cystic duct and gallbladder.  I was able to dissect out  the cystic duct circumferentially as the only tubular structure entering the  gallbladder.  A clip was placed on the gallbladder side of this and a small  incision was made in the cystic duct for intraoperative cholangiogram.  Through a separate stab incision,  a Cook cholangiogram catheter was placed  and this was placed easily into the cystic duct and controlled with a clip.  Intraoperative cholangiogram was performed using fluoroscopy and one half  strength Hypaque dye.  There was free flow of contrast down the cystic duct  into the common and hepatic duct up to the bifurcation of left and right  hepatic ducts.  Contrast flowed down to the distal common duct and then  stopped. There appeared to be a small meniscus consistent with a common duct  stone.  There was no flow of contrast into the duodenum.  A second attempt  was made trying to flush the stone. This was unsuccessful.  At this point I  felt that postoperative ERCP would be the most appropriate next step. We  completed the cholangiogram, removed the catheter and triple clipped the  cystic duct remnant and then went ahead and completed the  division of the  cystic duct. The cystic artery was identified.  Both anterior and posterior  branch were controlled and divided after clipping of both.  The cautery was  used to dissect the gallbladder from the gallbladder fossa without  difficulty.  I then extracted the gallbladder through an endocatch bag  through the umbilicus and passed it off the field.  The gallbladder was  examined.  There was some mild oozing which was controlled with cautery.  I  elected to place a drain using a #19 [place] Blake drain and given the fact  that she had distal obstruction with no flow of contrast around the stone  and for fear of potential blow out prior to ERCP or during ERCP.  The 48  [place] Harrison Mons was placed by pulling it through the subxiphoid port and I was  able to position the drain under the right lobe of the liver and the  gallbladder fossa without difficulty.  Excess irrigation was suctioned out.  The port sites were subsequently removed with no signs of port site  bleeding.  Of note, there was no evidence of any hollow or solid organ  injury at this point prior to removing the trocars.  Once all ports were  removed, the CO2 was released. The camera was withdrawn and passed off the  field.  The umbilical Hasson cannula was removed.  The pursestring suture of  0 Vicryl was tied at the umbilicus closing this fascial defect.  4-0  Monocryl was used to close all skin incisions.  All final counts of sponges,  needles and instruments were found to be correct.  The patient was awakened  and taken to recovery in satisfactory condition.      Thomas A. Cornett, M.D.  Electronically Signed     TAC/MEDQ  D:  10/29/2005  T:  10/29/2005  Job:  [id number]

## 2010-11-30 ENCOUNTER — Other Ambulatory Visit: Payer: Self-pay | Admitting: Endocrinology

## 2011-01-02 ENCOUNTER — Other Ambulatory Visit: Payer: Self-pay | Admitting: Endocrinology

## 2011-03-05 ENCOUNTER — Other Ambulatory Visit: Payer: Self-pay | Admitting: Endocrinology

## 2011-04-12 ENCOUNTER — Other Ambulatory Visit: Payer: Self-pay | Admitting: Endocrinology

## 2011-05-12 LAB — HM MAMMOGRAPHY: HM Mammogram: NORMAL

## 2011-05-14 ENCOUNTER — Telehealth: Payer: Self-pay | Admitting: Endocrinology

## 2011-05-14 MED ORDER — AMLODIPINE BESYLATE 2.5 MG PO TABS: ORAL_TABLET | ORAL | Status: DC

## 2011-05-14 NOTE — Telephone Encounter (Signed)
Pt needs to schedule an appointment with Dr. Sanjuana Mae last OV was in 01/2010. Will sent only 30 day supply but after this no more refills until she makes an appointment with Dr. Everardo All.

## 2011-05-14 NOTE — Telephone Encounter (Signed)
The pt called and is requesting a refill of Amlodipine 2.5mg  sent to the [facility] in [place].   Thanks!

## 2011-05-24 ENCOUNTER — Other Ambulatory Visit: Payer: Self-pay | Admitting: Obstetrics and Gynecology

## 2011-05-29 ENCOUNTER — Other Ambulatory Visit: Payer: Self-pay | Admitting: Endocrinology

## 2011-06-13 ENCOUNTER — Other Ambulatory Visit: Payer: Self-pay | Admitting: Endocrinology

## 2011-06-14 ENCOUNTER — Other Ambulatory Visit: Payer: Self-pay | Admitting: Endocrinology

## 2011-06-15 ENCOUNTER — Other Ambulatory Visit: Payer: Self-pay | Admitting: *Deleted

## 2011-06-15 MED ORDER — LOSARTAN POTASSIUM-HCTZ 100-25 MG PO TABS: 1.0000 | ORAL_TABLET | Freq: Every day | ORAL | Status: DC

## 2011-06-15 MED ORDER — AMLODIPINE BESYLATE 2.5 MG PO TABS: ORAL_TABLET | ORAL | Status: DC

## 2011-06-15 NOTE — Telephone Encounter (Signed)
Pt needs rx for both BP meds sent to [facility]. Rx sent for 30 day supply until pt come in for appointment.

## 2011-06-26 ENCOUNTER — Ambulatory Visit: Payer: Self-pay | Admitting: Endocrinology

## 2011-07-05 ENCOUNTER — Ambulatory Visit (INDEPENDENT_AMBULATORY_CARE_PROVIDER_SITE_OTHER): Payer: BC Managed Care – PPO | Admitting: Endocrinology

## 2011-07-05 ENCOUNTER — Encounter: Payer: Self-pay | Admitting: Endocrinology

## 2011-07-05 ENCOUNTER — Other Ambulatory Visit (INDEPENDENT_AMBULATORY_CARE_PROVIDER_SITE_OTHER): Payer: BC Managed Care – PPO

## 2011-07-05 DIAGNOSIS — E7800 Pure hypercholesterolemia, unspecified: Secondary | ICD-10-CM

## 2011-07-05 DIAGNOSIS — Z79899 Other long term (current) drug therapy: Secondary | ICD-10-CM

## 2011-07-05 DIAGNOSIS — R7309 Other abnormal glucose: Secondary | ICD-10-CM

## 2011-07-05 DIAGNOSIS — I10 Essential (primary) hypertension: Secondary | ICD-10-CM

## 2011-07-05 LAB — CBC WITH DIFFERENTIAL/PLATELET
Basophils Absolute: 0 10*3/uL (ref 0.0–0.1)
Basophils Relative: 0.4 % (ref 0.0–3.0)
Eosinophils Absolute: 0.2 10*3/uL (ref 0.0–0.7)
Eosinophils Relative: 1.9 % (ref 0.0–5.0)
HCT: 41.2 % (ref 36.0–46.0)
Hemoglobin: 14.2 g/dL (ref 12.0–15.0)
Lymphocytes Relative: 26.2 % (ref 12.0–46.0)
Lymphs Abs: 2.2 10*3/uL (ref 0.7–4.0)
MCHC: 34.6 g/dL (ref 30.0–36.0)
MCV: 91.2 fl (ref 78.0–100.0)
Monocytes Absolute: 0.6 10*3/uL (ref 0.1–1.0)
Monocytes Relative: 7.4 % (ref 3.0–12.0)
Neutro Abs: 5.4 10*3/uL (ref 1.4–7.7)
Neutrophils Relative %: 64.1 % (ref 43.0–77.0)
Platelets: 250 10*3/uL (ref 150.0–400.0)
RBC: 4.51 Mil/uL (ref 3.87–5.11)
RDW: 12.9 % (ref 11.5–14.6)
WBC: 8.5 10*3/uL (ref 4.5–10.5)

## 2011-07-05 LAB — URINALYSIS, ROUTINE W REFLEX MICROSCOPIC
Bilirubin Urine: NEGATIVE
Ketones, ur: NEGATIVE
Leukocytes, UA: NEGATIVE
Nitrite: NEGATIVE
Specific Gravity, Urine: >=1.03 (ref 1.000–1.030)
Total Protein, Urine: NEGATIVE
Urine Glucose: NEGATIVE
Urobilinogen, UA: 0.2 (ref 0.0–1.0)
pH: 6 (ref 5.0–8.0)

## 2011-07-05 LAB — HEMOGLOBIN A1C: Hgb A1c MFr Bld: 5.9 % (ref 4.6–6.5)

## 2011-07-05 MED ORDER — AZITHROMYCIN 500 MG PO TABS: 500.0000 mg | ORAL_TABLET | Freq: Every day | ORAL | Status: AC

## 2011-07-05 MED ORDER — AMLODIPINE BESYLATE 2.5 MG PO TABS: ORAL_TABLET | ORAL | Status: DC

## 2011-07-05 MED ORDER — PRAVASTATIN SODIUM 40 MG PO TABS: 40.0000 mg | ORAL_TABLET | Freq: Every day | ORAL | Status: DC

## 2011-07-05 MED ORDER — LOSARTAN POTASSIUM-HCTZ 100-25 MG PO TABS: 1.0000 | ORAL_TABLET | Freq: Every day | ORAL | Status: DC

## 2011-07-05 NOTE — Patient Instructions (Addendum)
i have sent a prescription to your pharmacy, for an antibiotic Loratadine-d (non-prescription) will help your congestion. blood tests are being requested for you today.  please call [phone] to hear your test results.  You will be prompted to enter the 9-digit "MRN" number that appears at the top left of this page, followed by #.  Then you will hear the message. Please schedule a regular physical soon.  (update: i left message on phone-tree:  We'll follow

## 2011-07-05 NOTE — Progress Notes (Signed)
  Subjective:    Patient ID: [name], female    DOB: 20-Feb-1951, 61 y.o.   MRN: [id number]  HPI Pt states few days of moderate congestion in the nose, but no assoc cough.   Past Medical History  Diagnosis Date  . ALLERGIC RHINITIS 01/03/2007  . COUGH DUE TO ACE INHIBITORS 08/12/2008    Due to Accupril  . Dizziness and giddiness 04/25/2007  . HYPERCHOLESTEROLEMIA 04/25/2007    rx refused  . HYPERGLYCEMIA 08/12/2008  . HYPERTENSION 01/03/2007    Past Surgical History  Procedure Date  . Tubal ligation     History   Social History  . Marital Status: Married    Spouse Name: N/A    Number of Children: N/A  . Years of Education: N/A   Occupational History  . Beautician    Social History Main Topics  . Smoking status: Never Smoker   . Smokeless tobacco: Not on file  . Alcohol Use: Not on file  . Drug Use: Not on file  . Sexually Active: Not on file   Other Topics Concern  . Not on file   Social History Narrative  . No narrative on file    Current Outpatient Prescriptions on File Prior to Visit  Medication Sig Dispense Refill  . mometasone (NASONEX) 50 MCG/ACT nasal spray Place 2 sprays into the nose daily.        No Known Allergies  Family History  Problem Relation Age of Onset  . Cancer Neg Hx     BP 122/74  Pulse 80  Temp(Src) 97.8 F (36.6 C) (Oral)  Ht 5\' 4"  (1.626 m)  Wt 222 lb (100.699 kg)  BMI 38.11 kg/m2  SpO2 95%   Review of Systems Denies fever and earache    Objective:   Physical Exam VITAL SIGNS:  See vs page GENERAL: no distress head: no deformity eyes: no periorbital swelling, no proptosis external nose and ears are normal mouth: no lesion seen Right tm is red.  Left is normal LUNGS:  Clear to auscultation   Lab Results  Component Value Date   WBC 8.5 07/05/2011   HGB 14.2 07/05/2011   HCT 41.2 07/05/2011   PLT 250.0 07/05/2011   GLUCOSE 106* 07/05/2011   CHOL 179 07/05/2011   TRIG 88.0 07/05/2011   HDL 60.30 07/05/2011     LDLCALC 101* 07/05/2011   ALT 20 07/05/2011   AST 27 07/05/2011   NA 138 07/05/2011   K 3.3* 07/05/2011   CL 102 07/05/2011   CREATININE 0.8 07/05/2011   BUN 16 07/05/2011   CO2 32 07/05/2011   TSH 1.48 07/05/2011   PSA 0.00* 01/13/2010   HGBA1C 5.9 07/05/2011      Assessment & Plan:  URI, new Hypokalemia, new Hyperglycemia, stable

## 2011-07-06 LAB — HEPATIC FUNCTION PANEL
ALT: 20 U/L (ref 0–35)
AST: 27 U/L (ref 0–37)
Albumin: 4.1 g/dL (ref 3.5–5.2)
Alkaline Phosphatase: 71 U/L (ref 39–117)
Bilirubin, Direct: 0.1 mg/dL (ref 0.0–0.3)
Total Bilirubin: 0.7 mg/dL (ref 0.3–1.2)
Total Protein: 7.6 g/dL (ref 6.0–8.3)

## 2011-07-06 LAB — LIPID PANEL
Cholesterol: 179 mg/dL (ref 0–200)
HDL: 60.3 mg/dL (ref >39.00)
LDL Cholesterol: 101 mg/dL — ABNORMAL HIGH (ref 0–99)
Total CHOL/HDL Ratio: 3
Triglycerides: 88 mg/dL (ref 0.0–149.0)
VLDL: 17.6 mg/dL (ref 0.0–40.0)

## 2011-07-06 LAB — BASIC METABOLIC PANEL
BUN: 16 mg/dL (ref 6–23)
CO2: 32 mEq/L (ref 19–32)
Calcium: 9.4 mg/dL (ref 8.4–10.5)
Chloride: 102 mEq/L (ref 96–112)
Creatinine, Ser: 0.8 mg/dL (ref 0.4–1.2)
GFR: 93.09 mL/min (ref >60.00)
Glucose, Bld: 106 mg/dL — ABNORMAL HIGH (ref 70–99)
Potassium: 3.3 mEq/L — ABNORMAL LOW (ref 3.5–5.1)
Sodium: 138 mEq/L (ref 135–145)

## 2011-07-06 LAB — TSH: TSH: 1.48 u[IU]/mL (ref 0.35–5.50)

## 2011-07-18 ENCOUNTER — Encounter: Payer: Self-pay | Admitting: Endocrinology

## 2011-07-18 ENCOUNTER — Ambulatory Visit (INDEPENDENT_AMBULATORY_CARE_PROVIDER_SITE_OTHER): Payer: BC Managed Care – PPO | Admitting: Endocrinology

## 2011-07-18 ENCOUNTER — Ambulatory Visit (INDEPENDENT_AMBULATORY_CARE_PROVIDER_SITE_OTHER)
Admission: RE | Admit: 2011-07-18 | Discharge: 2011-07-18 | Disposition: A | Discharge status: Home / Self Care | Payer: BC Managed Care – PPO | Source: Ambulatory Visit | Attending: Endocrinology | Admitting: Endocrinology

## 2011-07-18 VITALS — BP 142/82 | HR 76 | Temp 99.0°F | Ht 64.0 in | Wt 217.0 lb

## 2011-07-18 DIAGNOSIS — R059 Cough, unspecified: Secondary | ICD-10-CM

## 2011-07-18 DIAGNOSIS — R05 Cough: Secondary | ICD-10-CM

## 2011-07-18 MED ORDER — CEFUROXIME AXETIL 250 MG PO TABS: 250.0000 mg | ORAL_TABLET | Freq: Two times a day (BID) | ORAL | Status: DC

## 2011-07-18 MED ORDER — PROMETHAZINE-CODEINE 6.25-10 MG/5ML PO SYRP: 5.0000 mL | ORAL_SOLUTION | ORAL | Status: DC | PRN

## 2011-07-18 NOTE — Patient Instructions (Addendum)
i have sent a prescription to your pharmacy, for a different antibiotic.   Loratadine-d (non-prescription) will help your congestion. here is a sample of "advair-250."  take 1 puff 2x a day.  rinse mouth after using. Please schedule a regular physical soon.  A chest-x-ray is being requested for you today.  please call [phone] to hear your test results.  You will be prompted to enter the 9-digit "MRN" number that appears at the top left of this page, followed by #.  Then you will hear the message. here is a prescription for cough syrup.

## 2011-07-18 NOTE — Progress Notes (Signed)
  Subjective:    Patient ID: Angela Estrada, female    DOB: 02/02/1951, 61 y.o.   MRN: [id number]  HPI Pt says she recovered from [facility] for which she was recently seen here.  However, she had 1 week of slight prod-quality cough in the chest, and assoc nasal congestion.   She also has wheezing in the chest.   Past Medical History  Diagnosis Date  . ALLERGIC RHINITIS 01/03/2007  . COUGH DUE TO ACE INHIBITORS 08/12/2008    Due to Accupril  . Dizziness and giddiness 04/25/2007  . HYPERCHOLESTEROLEMIA 04/25/2007    rx refused  . HYPERGLYCEMIA 08/12/2008  . HYPERTENSION 01/03/2007    Past Surgical History  Procedure Date  . Tubal ligation     History   Social History  . Marital Status: Married    Spouse Name: N/A    Number of Children: N/A  . Years of Education: N/A   Occupational History  . Beautician    Social History Main Topics  . Smoking status: Never Smoker   . Smokeless tobacco: Not on file  . Alcohol Use: Not on file  . Drug Use: Not on file  . Sexually Active: Not on file   Other Topics Concern  . Not on file   Social History Narrative  . No narrative on file    Current Outpatient Prescriptions on File Prior to Visit  Medication Sig Dispense Refill  . amLODipine (NORVASC) 2.5 MG tablet TAKE 1 TABLET EVERY DAY  90 tablet  3  . losartan-hydrochlorothiazide (HYZAAR) 100-25 MG per tablet Take 1 tablet by mouth daily.  90 tablet  3  . mometasone (NASONEX) 50 MCG/ACT nasal spray Place 2 sprays into the nose daily.      . pravastatin (PRAVACHOL) 40 MG tablet Take 1 tablet (40 mg total) by mouth daily.  90 tablet  3    Allergies  Allergen Reactions  . Lisinopril Cough    Family History  Problem Relation Age of Onset  . Cancer Neg Hx     BP 142/82  Pulse 76  Temp(Src) 99 F (37.2 C) (Oral)  Ht 5\' 4"  (1.626 m)  Wt 217 lb (98.431 kg)  BMI 37.25 kg/m2  SpO2 95%  Review of Systems She has chills and low-grade fever.      Objective:   Physical  Exam VITAL SIGNS:  See vs page GENERAL: no distress head: no deformity eyes: no periorbital swelling, no proptosis external nose and ears are normal mouth: no lesion seen Both tm's are red LUNGS:  Clear to auscultation, except for rales at the right base.     CXR: NAD    Assessment & Plan:  Acute bronchitis, new

## 2011-07-25 ENCOUNTER — Telehealth: Payer: Self-pay

## 2011-07-25 NOTE — Telephone Encounter (Signed)
Pt called stating that her sxs have not improved with ABX prescribed by SAE 02/07. Pt c/o chest and sinus congestion and productive cough, no fever or SOB but mild body aches. Today is the last day of pt's ABX Pt is requesting advisement from MD.

## 2011-07-26 MED ORDER — AZITHROMYCIN 250 MG PO TABS: ORAL_TABLET | ORAL | Status: AC

## 2011-07-26 NOTE — Telephone Encounter (Signed)
Ok to try change to zpack - done per emr

## 2011-07-26 NOTE — Telephone Encounter (Signed)
Patient informed. 

## 2011-07-31 ENCOUNTER — Other Ambulatory Visit: Payer: Self-pay

## 2011-07-31 MED ORDER — PROMETHAZINE-CODEINE 6.25-10 MG/5ML PO SYRP: 5.0000 mL | ORAL_SOLUTION | ORAL | Status: AC | PRN

## 2011-07-31 NOTE — Telephone Encounter (Signed)
Please verify no fever or sob i printed 

## 2011-07-31 NOTE — Telephone Encounter (Signed)
Pt denied fever or SOB, Rx given to spouse who had appt with his PCP today.

## 2011-07-31 NOTE — Telephone Encounter (Signed)
Pt called requesting a refill of cough syrup. Pt says that she is feeling better but still has disruptive cough.

## 2011-09-21 ENCOUNTER — Other Ambulatory Visit: Payer: Self-pay | Admitting: Endocrinology

## 2011-09-26 ENCOUNTER — Encounter: Payer: Self-pay | Admitting: Endocrinology

## 2011-09-26 ENCOUNTER — Ambulatory Visit (INDEPENDENT_AMBULATORY_CARE_PROVIDER_SITE_OTHER): Payer: BC Managed Care – PPO | Admitting: Endocrinology

## 2011-09-26 VITALS — BP 146/88 | HR 105 | Temp 98.9°F | Ht 64.0 in | Wt 221.0 lb

## 2011-09-26 DIAGNOSIS — H6690 Otitis media, unspecified, unspecified ear: Secondary | ICD-10-CM

## 2011-09-26 DIAGNOSIS — J069 Acute upper respiratory infection, unspecified: Secondary | ICD-10-CM

## 2011-09-26 MED ORDER — CEFTRIAXONE SODIUM 500 MG IJ SOLR
500.0000 mg | Freq: Once | INTRAMUSCULAR | Status: AC
  Administered 2011-09-26: 500 mg via INTRAMUSCULAR

## 2011-09-26 MED ORDER — PROMETHAZINE-CODEINE 6.25-10 MG/5ML PO SYRP: 5.0000 mL | ORAL_SOLUTION | ORAL | Status: AC | PRN

## 2011-09-26 MED ORDER — DOXYCYCLINE HYCLATE 100 MG PO TABS: 100.0000 mg | ORAL_TABLET | Freq: Two times a day (BID) | ORAL | Status: AC

## 2011-09-26 NOTE — Progress Notes (Signed)
  Subjective:    Patient ID: Angela Estrada, female    DOB: 01-Oct-1950, 61 y.o.   MRN: [id number]  HPI Pt states few days of moderate pain at both ears, and assoc nasal congestion Past Medical History  Diagnosis Date  . ALLERGIC RHINITIS 01/03/2007  . COUGH DUE TO ACE INHIBITORS 08/12/2008    Due to Accupril  . Dizziness and giddiness 04/25/2007  . HYPERCHOLESTEROLEMIA 04/25/2007    rx refused  . HYPERGLYCEMIA 08/12/2008  . HYPERTENSION 01/03/2007    Past Surgical History  Procedure Date  . Tubal ligation     History   Social History  . Marital Status: Married    Spouse Name: N/A    Number of Children: N/A  . Years of Education: N/A   Occupational History  . Beautician    Social History Main Topics  . Smoking status: Never Smoker   . Smokeless tobacco: Not on file  . Alcohol Use: Not on file  . Drug Use: Not on file  . Sexually Active: Not on file   Other Topics Concern  . Not on file   Social History Narrative  . No narrative on file    Current Outpatient Prescriptions on File Prior to Visit  Medication Sig Dispense Refill  . amLODipine (NORVASC) 2.5 MG tablet TAKE 1 TABLET EVERY DAY  90 tablet  3  . losartan-hydrochlorothiazide (HYZAAR) 100-25 MG per tablet Take 1 tablet by mouth daily.  90 tablet  3  . mometasone (NASONEX) 50 MCG/ACT nasal spray Place 2 sprays into the nose daily.      . pravastatin (PRAVACHOL) 40 MG tablet TAKE 1 TABLET AT BEDTIME  90 tablet  3    Allergies  Allergen Reactions  . Lisinopril Cough    Family History  Problem Relation Age of Onset  . Cancer Neg Hx     BP 146/88  Pulse 105  Temp(Src) 98.9 F (37.2 C) (Oral)  Ht 5\' 4"  (1.626 m)  Wt 221 lb (100.245 kg)  BMI 37.93 kg/m2  SpO2 96%   Review of Systems Fever is better, but she has sore throat.  She has a dry cough     Objective:   Physical Exam VITAL SIGNS:  See vs page GENERAL: no distress head: no deformity eyes: no periorbital swelling, no  proptosis external nose and ears are normal mouth: no lesion seen, but pharynx is red. Both tm's are red.     Assessment & Plan:  AOM, new ceftriaxone 500 mg IM

## 2011-09-26 NOTE — Patient Instructions (Addendum)
i have sent a prescription to your pharmacy, for an antibiotic pill.   Loratadine-d (non-prescription) will help your congestion.   I hope you feel better soon.  If you don't feel better by next week, please call back. Please come in soon for a regular physical.

## 2011-12-25 ENCOUNTER — Telehealth: Payer: Self-pay | Admitting: *Deleted

## 2011-12-25 DIAGNOSIS — Z0000 Encounter for general adult medical examination without abnormal findings: Secondary | ICD-10-CM

## 2011-12-25 NOTE — Telephone Encounter (Signed)
Received staff msg made cpx for August. Need labs entered in epic,,, 12/25/11@3 :37pm/LMB

## 2011-12-25 NOTE — Telephone Encounter (Signed)
Message copied by Deatra James on Tue Dec 25, 2011  3:36 PM ------      Message from: COUSIN, [place] T      Created: Tue Dec 25, 2011  2:18 PM      Regarding: PHY DATE  01/18/12       THANKS

## 2012-01-15 ENCOUNTER — Other Ambulatory Visit (INDEPENDENT_AMBULATORY_CARE_PROVIDER_SITE_OTHER): Payer: BC Managed Care – PPO

## 2012-01-15 DIAGNOSIS — Z0000 Encounter for general adult medical examination without abnormal findings: Secondary | ICD-10-CM

## 2012-01-15 LAB — CBC WITH DIFFERENTIAL/PLATELET
Basophils Absolute: 0 10*3/uL (ref 0.0–0.1)
Basophils Relative: 0.3 % (ref 0.0–3.0)
Eosinophils Absolute: 0.2 10*3/uL (ref 0.0–0.7)
Eosinophils Relative: 2.3 % (ref 0.0–5.0)
HCT: 42 % (ref 36.0–46.0)
Hemoglobin: 14.3 g/dL (ref 12.0–15.0)
Lymphocytes Relative: 22.3 % (ref 12.0–46.0)
Lymphs Abs: 1.7 10*3/uL (ref 0.7–4.0)
MCHC: 34.2 g/dL (ref 30.0–36.0)
MCV: 89.9 fl (ref 78.0–100.0)
Monocytes Absolute: 0.5 10*3/uL (ref 0.1–1.0)
Monocytes Relative: 6.9 % (ref 3.0–12.0)
Neutro Abs: 5.1 10*3/uL (ref 1.4–7.7)
Neutrophils Relative %: 68.2 % (ref 43.0–77.0)
Platelets: 219 10*3/uL (ref 150.0–400.0)
RBC: 4.67 Mil/uL (ref 3.87–5.11)
RDW: 12.9 % (ref 11.5–14.6)
WBC: 7.4 10*3/uL (ref 4.5–10.5)

## 2012-01-15 LAB — URINALYSIS, ROUTINE W REFLEX MICROSCOPIC
Bilirubin Urine: NEGATIVE
Hgb urine dipstick: NEGATIVE
Ketones, ur: NEGATIVE
Leukocytes, UA: NEGATIVE
Nitrite: NEGATIVE
Specific Gravity, Urine: <=1.005 (ref 1.000–1.030)
Total Protein, Urine: NEGATIVE
Urine Glucose: NEGATIVE
Urobilinogen, UA: 0.2 (ref 0.0–1.0)
pH: 7.5 (ref 5.0–8.0)

## 2012-01-15 LAB — BASIC METABOLIC PANEL
BUN: 10 mg/dL (ref 6–23)
CO2: 31 mEq/L (ref 19–32)
Calcium: 9.3 mg/dL (ref 8.4–10.5)
Chloride: 99 mEq/L (ref 96–112)
Creatinine, Ser: 0.8 mg/dL (ref 0.4–1.2)
GFR: 101.55 mL/min (ref >60.00)
Glucose, Bld: 114 mg/dL — ABNORMAL HIGH (ref 70–99)
Potassium: 3.7 mEq/L (ref 3.5–5.1)
Sodium: 139 mEq/L (ref 135–145)

## 2012-01-15 LAB — LIPID PANEL
Cholesterol: 163 mg/dL (ref 0–200)
HDL: 56.3 mg/dL (ref >39.00)
LDL Cholesterol: 84 mg/dL (ref 0–99)
Total CHOL/HDL Ratio: 3
Triglycerides: 114 mg/dL (ref 0.0–149.0)
VLDL: 22.8 mg/dL (ref 0.0–40.0)

## 2012-01-15 LAB — HEPATIC FUNCTION PANEL
ALT: 20 U/L (ref 0–35)
AST: 25 U/L (ref 0–37)
Albumin: 3.8 g/dL (ref 3.5–5.2)
Alkaline Phosphatase: 71 U/L (ref 39–117)
Bilirubin, Direct: 0.1 mg/dL (ref 0.0–0.3)
Total Bilirubin: 0.8 mg/dL (ref 0.3–1.2)
Total Protein: 7.5 g/dL (ref 6.0–8.3)

## 2012-01-15 LAB — TSH: TSH: 2.13 u[IU]/mL (ref 0.35–5.50)

## 2012-01-18 ENCOUNTER — Encounter: Payer: Self-pay | Admitting: Endocrinology

## 2012-01-18 ENCOUNTER — Ambulatory Visit (INDEPENDENT_AMBULATORY_CARE_PROVIDER_SITE_OTHER): Payer: BC Managed Care – PPO | Admitting: Endocrinology

## 2012-01-18 VITALS — BP 112/80 | HR 68 | Temp 98.1°F | Ht 64.0 in | Wt 230.0 lb

## 2012-01-18 DIAGNOSIS — K439 Ventral hernia without obstruction or gangrene: Secondary | ICD-10-CM

## 2012-01-18 DIAGNOSIS — I10 Essential (primary) hypertension: Secondary | ICD-10-CM

## 2012-01-18 MED ORDER — CEFUROXIME AXETIL 250 MG PO TABS: 250.0000 mg | ORAL_TABLET | Freq: Two times a day (BID) | ORAL | Status: DC

## 2012-01-18 MED ORDER — AMLODIPINE BESYLATE 2.5 MG PO TABS: ORAL_TABLET | ORAL | Status: DC

## 2012-01-18 MED ORDER — LOSARTAN POTASSIUM-HCTZ 100-25 MG PO TABS: 1.0000 | ORAL_TABLET | Freq: Every day | ORAL | Status: DC

## 2012-01-18 NOTE — Progress Notes (Signed)
Subjective:    Patient ID: Angela Estrada, female    DOB: 1951/03/04, 61 y.o.   MRN: [id number]  HPI here for regular wellness examination.  He's feeling pretty well in general, and says chronic med probs are stable, except as noted below. Past Medical History  Diagnosis Date  . ALLERGIC RHINITIS 01/03/2007  . COUGH DUE TO ACE INHIBITORS 08/12/2008    Due to Accupril  . Dizziness and giddiness 04/25/2007  . HYPERCHOLESTEROLEMIA 04/25/2007    rx refused  . HYPERGLYCEMIA 08/12/2008  . HYPERTENSION 01/03/2007    Past Surgical History  Procedure Date  . Tubal ligation     History   Social History  . Marital Status: Married    Spouse Name: N/A    Number of Children: N/A  . Years of Education: N/A   Occupational History  . Beautician    Social History Main Topics  . Smoking status: Never Smoker   . Smokeless tobacco: Not on file  . Alcohol Use: Not on file  . Drug Use: Not on file  . Sexually Active: Not on file   Other Topics Concern  . Not on file   Social History Narrative  . No narrative on file    Current Outpatient Prescriptions on File Prior to Visit  Medication Sig Dispense Refill  . amLODipine (NORVASC) 2.5 MG tablet TAKE 1 TABLET EVERY DAY  90 tablet  3  . losartan-hydrochlorothiazide (HYZAAR) 100-25 MG per tablet Take 1 tablet by mouth daily.  90 tablet  3  . mometasone (NASONEX) 50 MCG/ACT nasal spray Place 2 sprays into the nose daily.      . pravastatin (PRAVACHOL) 40 MG tablet TAKE 1 TABLET AT BEDTIME  90 tablet  3    Allergies  Allergen Reactions  . Lisinopril Cough    Family History  Problem Relation Age of Onset  . Cancer Neg Hx     BP 112/80  Pulse 68  Temp 98.1 F (36.7 C) (Oral)  Ht 5\' 4"  (1.626 m)  Wt 230 lb (104.327 kg)  BMI 39.48 kg/m2  SpO2 96%    Review of Systems  Constitutional: Negative for fever and unexpected weight change.  HENT: Negative for hearing loss.   Eyes: Negative for visual disturbance.  Respiratory:  Negative for shortness of breath.   Cardiovascular: Negative for chest pain.  Gastrointestinal: Negative for anal bleeding.  Genitourinary: Negative for hematuria.  Musculoskeletal: Negative for back pain.  Skin: Negative for rash.  Neurological: Negative for syncope, numbness and headaches.  Hematological: Bruises/bleeds easily.  Psychiatric/Behavioral: Negative for dysphoric mood.       Objective:   Physical Exam VS: see vs page GEN: no distress HEAD: head: no deformity eyes: no periorbital swelling, no proptosis external nose and ears are normal mouth: no lesion seen NECK: supple, thyroid is not enlarged CHEST WALL: no deformity LUNGS:  Clear to auscultation BREASTS:  sees gyn CV: reg rate and rhythm, no murmur.  GENITALIA/RECTAL: sees gyn MUSCULOSKELETAL: muscle bulk and strength are grossly normal.  no obvious joint swelling.  gait is normal and steady EXTEMITIES: no deformity.  no ulcer on the feet.  feet are of normal color and temp.  no edema.   Right great toenail is surgically absent.  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  no carotid bruit NEURO:  cn 2-12 grossly intact.   readily moves all 4's.  sensation is intact to touch on the feet SKIN:  Normal texture  and temperature.  No rash or suspicious lesion is visible.   NODES:  None palpable at the neck.  PSYCH: alert, oriented x3.  Does not appear anxious nor depressed.     Assessment & Plan:  Wellness visit today, with problems stable, except as noted.   SEPARATE EVALUATION FOLLOWS--EACH PROBLEM HERE IS NEW, NOT RESPONDING TO TREATMENT, OR POSES SIGNIFICANT RISK TO THE PATIENT'S HEALTH: HISTORY OF THE PRESENT ILLNESS: Pt states 10 days of moderate congestion in the nose, and assoc rhinorrhea. PAST MEDICAL HISTORY reviewed and up to date today REVIEW OF SYSTEMS: Denies earache.  She says ventral hernia is becoming painful.   PHYSICAL EXAMINATION: VITAL SIGNS:  See vs  page. GENERAL: no distress. Both tm's are red ABD: abdomen is soft, nontender.  no hepatosplenomegaly.  not distended.  Self-reducing ventral hernia IMPRESSION: Ventral hernia, worse. Allergic rhinitis, recurrent.  Possibly complicated by a new uri.   PLAN: See instruction page.

## 2012-01-18 NOTE — Patient Instructions (Addendum)
i have sent a prescription to your pharmacy, for an antibiotic pill Loratadine-d (non-prescription) will help your congestion. Refer to a surgery specialist.  you will receive a phone call, about a day and time for an appointment please consider these measures for your health:  minimize alcohol.  do not use tobacco products.  have a colonoscopy at least every 10 years from age 59.  Women should have an annual mammogram from age 30.  keep firearms safely stored.  always use seat belts.  have working smoke alarms in your home.  see an eye doctor and dentist regularly.  never drive under the influence of alcohol or drugs (including prescription drugs).  those with fair skin should take precautions against the sun. Please return in 1 year. Angela Estrada

## 2012-01-29 ENCOUNTER — Encounter (INDEPENDENT_AMBULATORY_CARE_PROVIDER_SITE_OTHER): Payer: Self-pay | Admitting: Surgery

## 2012-01-29 ENCOUNTER — Ambulatory Visit (INDEPENDENT_AMBULATORY_CARE_PROVIDER_SITE_OTHER): Payer: BC Managed Care – PPO | Admitting: Surgery

## 2012-01-29 VITALS — BP 166/82 | HR 89 | Temp 97.1°F | Ht 64.0 in | Wt 229.2 lb

## 2012-01-29 DIAGNOSIS — K432 Incisional hernia without obstruction or gangrene: Secondary | ICD-10-CM

## 2012-01-29 NOTE — Progress Notes (Signed)
Patient ID: Angela Estrada, female   DOB: 07/12/1950, 61 y.o.   MRN: [id number]  Chief Complaint  Patient presents with  . Pre-op Exam    eval Ventral hernia    HPI Angela Estrada is a 61 y.o. female. Incisional hernia HPI This is a pleasant female referred by Dr. Everardo All for evaluation of a symptomatic incisional hernia. She reports it has been there approximately 3 years. It was originally asymptomatic it is now causing her to have discomfort and occasional nausea and perhaps some emesis. She is moving her bowels well and today she is currently pain-free.   she did not do much heavy lifting.  The pain is only mild and does not refer any where else. She is otherwise without complaints. Past Medical History  Diagnosis Date  . ALLERGIC RHINITIS 01/03/2007  . COUGH DUE TO ACE INHIBITORS 08/12/2008    Due to Accupril  . Dizziness and giddiness 04/25/2007  . HYPERCHOLESTEROLEMIA 04/25/2007    rx refused  . HYPERGLYCEMIA 08/12/2008  . HYPERTENSION 01/03/2007    Past Surgical History  Procedure Date  . Tubal ligation   . Cholecystectomy   . Joint replacement     bilateral knee replacements    Family History  Problem Relation Age of Onset  . Cancer Neg Hx     Social History History  Substance Use Topics  . Smoking status: Never Smoker   . Smokeless tobacco: Not on file  . Alcohol Use: No    Allergies  Allergen Reactions  . Lisinopril Cough    Current Outpatient Prescriptions  Medication Sig Dispense Refill  . amLODipine (NORVASC) 2.5 MG tablet TAKE 1 TABLET EVERY DAY  90 tablet  3  . cefUROXime (CEFTIN) 250 MG tablet Take 250 mg by mouth 2 (two) times daily.      Marland Kitchen losartan-hydrochlorothiazide (HYZAAR) 100-25 MG per tablet Take 1 tablet by mouth daily.  90 tablet  3  . mometasone (NASONEX) 50 MCG/ACT nasal spray Place 2 sprays into the nose daily.      . pravastatin (PRAVACHOL) 40 MG tablet TAKE 1 TABLET AT BEDTIME  90 tablet  3    Review of Systems Review of  Systems  Constitutional: Negative for fever, chills and unexpected weight change.  HENT: Negative for hearing loss, congestion, sore throat, trouble swallowing and voice change.   Eyes: Negative for visual disturbance.  Respiratory: Negative for cough and wheezing.   Cardiovascular: Negative for chest pain, palpitations and leg swelling.  Gastrointestinal: Positive for nausea, vomiting and abdominal pain. Negative for diarrhea, constipation, blood in stool, abdominal distention and anal bleeding.  Genitourinary: Negative for hematuria, vaginal bleeding and difficulty urinating.  Musculoskeletal: Positive for joint swelling and gait problem. Negative for arthralgias.  Skin: Negative for rash and wound.  Neurological: Negative for seizures, syncope and headaches.  Hematological: Negative for adenopathy. Does not bruise/bleed easily.  Psychiatric/Behavioral: Negative for confusion.    Blood pressure 166/82, pulse 89, temperature 97.1 F (36.2 C), temperature source Temporal, height 5\' 4"  (1.626 m), weight 229 lb 3.2 oz (103.964 kg), SpO2 98.00%.  Physical Exam Physical Exam  Constitutional: She is oriented to person, place, and time.       Obese female in no acute distress  HENT:  Head: Normocephalic and atraumatic.  Right Ear: External ear normal.  Left Ear: External ear normal.  Nose: Nose normal.  Mouth/Throat: Oropharynx is clear and moist. No oropharyngeal exudate.  Eyes: Conjunctivae are normal. Pupils are equal, round, and reactive  to light. Right eye exhibits no discharge. Left eye exhibits no discharge. No scleral icterus.  Neck: Normal range of motion. Neck supple. No tracheal deviation present. No thyromegaly present.  Cardiovascular: Normal rate, regular rhythm, normal heart sounds and intact distal pulses.   No murmur heard. Pulmonary/Chest: Effort normal and breath sounds normal. No respiratory distress. She has no wheezes.  Abdominal: Soft. Bowel sounds are normal. She  exhibits no mass. There is no tenderness. There is no rebound.       There is an easily reducible ventral incisional hernia just above the umbilicus  Musculoskeletal: Normal range of motion. She exhibits no edema and no tenderness.  Lymphadenopathy:    She has no cervical adenopathy.  Neurological: She is alert and oriented to person, place, and time.  Skin: Skin is warm and dry. No rash noted. She is not diaphoretic. No erythema.  Psychiatric: Her behavior is normal. Judgment normal.    Data Reviewed   Assessment    Ventral incisional hernia    Plan    Laparoscopic repair with mesh was recommended. I discussed the diagnosis with her in the surgical procedure in detail. I discussed the risks of surgery which includes but is not limited to bleeding, infection, injury to the bowel, need to convert to an open procedure, recurrence, use of mesh, etc. She understands and wishes to proceed with laparoscopic incisional hernia repair with mesh. Likelihood of success is good       Angela Estrada A 01/29/2012, 10:43 AM

## 2012-02-15 ENCOUNTER — Encounter (HOSPITAL_COMMUNITY)
Admission: RE | Admit: 2012-02-15 | Discharge: 2012-02-15 | Disposition: A | Discharge status: Home / Self Care | Payer: BC Managed Care – PPO | Source: Ambulatory Visit | Attending: Surgery | Admitting: Surgery

## 2012-02-15 ENCOUNTER — Encounter (HOSPITAL_COMMUNITY): Payer: Self-pay

## 2012-02-15 HISTORY — DX: Unspecified osteoarthritis, unspecified site: M19.90

## 2012-02-15 LAB — CBC
HCT: 41.9 % (ref 36.0–46.0)
Hemoglobin: 14.7 g/dL (ref 12.0–15.0)
MCH: 30.8 pg (ref 26.0–34.0)
MCHC: 35.1 g/dL (ref 30.0–36.0)
MCV: 87.8 fL (ref 78.0–100.0)
Platelets: 243 10*3/uL (ref 150–400)
RBC: 4.77 MIL/uL (ref 3.87–5.11)
RDW: 13.3 % (ref 11.5–15.5)
WBC: 8.1 10*3/uL (ref 4.0–10.5)

## 2012-02-15 LAB — BASIC METABOLIC PANEL
BUN: 12 mg/dL (ref 6–23)
CO2: 31 mEq/L (ref 19–32)
Calcium: 9.8 mg/dL (ref 8.4–10.5)
Chloride: 101 mEq/L (ref 96–112)
Creatinine, Ser: 0.77 mg/dL (ref 0.50–1.10)
GFR calc Af Amer: >90 mL/min (ref >90)
GFR calc non Af Amer: >90 mL/min (ref >90)
Glucose, Bld: 85 mg/dL (ref 70–99)
Potassium: 3.5 mEq/L (ref 3.5–5.1)
Sodium: 141 mEq/L (ref 135–145)

## 2012-02-15 LAB — SURGICAL PCR SCREEN
MRSA, PCR: NEGATIVE
Staphylococcus aureus: NEGATIVE

## 2012-02-15 NOTE — Pre-Procedure Instructions (Addendum)
20 Angela Estrada   02/15/2012   Your procedure is scheduled on: September 12th, Thursday   Report to [facility] Stay [facility] at  7:30 AM.             Surgery time is posted from 10:30 Am to 12:00 Noon.Marland KitchenMarland KitchenYour Surgeon requests you come in 3 hrs early.   Call this number if you have problems the morning of surgery: [phone]   Remember:   Do not eat food or drink any fluids:After Midnight Wednesday.    Take these medicines the morning of surgery with A SIP OF WATER: Norvasc, Nasonex  STOP aspirin, and fish 0il now   Do not wear jewelry, make-up or nail polish.  Do not wear lotions, powders, or perfumes. You may NOT wear deodorant.  Ladies--Do not shave 48 hours prior to surgery.   Men may shave face and neck.   Do not bring valuables to the hospital.  Contacts, dentures or bridgework may not be worn into surgery.   Leave suitcase in the car. After surgery it may be brought to your room.  For patients admitted to the hospital, checkout time is 11:00 AM the day of discharge.   Patients discharged the day of surgery will not be allowed to drive home and someone needs to stay              With you for the first 24 hrs.   Name and phone number of your driver: edward spouse [phone]    Special Instructions: CHG Shower Use Special Wash: 1/2 bottle night before surgery and 1/2 bottle morning of surgery.   Please read over the following fact sheets that you were given: Pain Booklet, Coughing and Deep Breathing, MRSA Information and Surgical Site Infection Prevention

## 2012-02-15 NOTE — Progress Notes (Addendum)
Cxr 2/13, ekg 8/13

## 2012-02-20 MED ORDER — CEFAZOLIN SODIUM-DEXTROSE 2-3 GM-% IV SOLR
2.0000 g | INTRAVENOUS | Status: AC
  Administered 2012-02-21: 2 g via INTRAVENOUS
  Filled 2012-02-20: qty 50

## 2012-02-20 NOTE — H&P (Signed)
Patient ID: Angela Estrada, female DOB: 1951-03-03, 61 y.o. MRN: [id number]  Chief Complaint   Patient presents with   .  Pre-op Exam     eval Ventral hernia    HPI  Angela Estrada is a 61 y.o. female. Incisional hernia  HPI  This is a pleasant female referred by Dr. Everardo Estrada for evaluation of a symptomatic incisional hernia. She reports it has been there approximately 3 years. It was originally asymptomatic it is now causing her to have discomfort and occasional nausea and perhaps some emesis. She is moving her bowels well and today she is currently pain-free. she did not do much heavy lifting. The pain is only mild and does not refer any where else. She is otherwise without complaints.  Past Medical History   Diagnosis  Date   .  ALLERGIC RHINITIS  01/03/2007   .  COUGH DUE TO ACE INHIBITORS  08/12/2008     Due to Accupril   .  Dizziness and giddiness  04/25/2007   .  HYPERCHOLESTEROLEMIA  04/25/2007     rx refused   .  HYPERGLYCEMIA  08/12/2008   .  HYPERTENSION  01/03/2007    Past Surgical History   Procedure  Date   .  Tubal ligation    .  Cholecystectomy    .  Joint replacement      bilateral knee replacements    Family History   Problem  Relation  Age of Onset   .  Cancer  Neg Hx     Social History  History   Substance Use Topics   .  Smoking status:  Never Smoker   .  Smokeless tobacco:  Not on file   .  Alcohol Use:  No    Allergies   Allergen  Reactions   .  Lisinopril  Cough    Current Outpatient Prescriptions   Medication  Sig  Dispense  Refill   .  amLODipine (NORVASC) 2.5 MG tablet  TAKE 1 TABLET EVERY DAY  90 tablet  3   .  cefUROXime (CEFTIN) 250 MG tablet  Take 250 mg by mouth 2 (two) times daily.     Marland Kitchen  losartan-hydrochlorothiazide (HYZAAR) 100-25 MG per tablet  Take 1 tablet by mouth daily.  90 tablet  3   .  mometasone (NASONEX) 50 MCG/ACT nasal spray  Place 2 sprays into the nose daily.     .  pravastatin (PRAVACHOL) 40 MG tablet  TAKE 1 TABLET  AT BEDTIME  90 tablet  3    Review of Systems  Review of Systems  Constitutional: Negative for fever, chills and unexpected weight change.  HENT: Negative for hearing loss, congestion, sore throat, trouble swallowing and voice change.  Eyes: Negative for visual disturbance.  Respiratory: Negative for cough and wheezing.  Cardiovascular: Negative for chest pain, palpitations and leg swelling.  Gastrointestinal: Positive for nausea, vomiting and abdominal pain. Negative for diarrhea, constipation, blood in stool, abdominal distention and anal bleeding.  Genitourinary: Negative for hematuria, vaginal bleeding and difficulty urinating.  Musculoskeletal: Positive for joint swelling and gait problem. Negative for arthralgias.  Skin: Negative for rash and wound.  Neurological: Negative for seizures, syncope and headaches.  Hematological: Negative for adenopathy. Does not bruise/bleed easily.  Psychiatric/Behavioral: Negative for confusion.   Blood pressure 166/82, pulse 89, temperature 97.1 F (36.2 C), temperature source Temporal, height 5\' 4"  (1.626 m), weight 229 lb 3.2 oz (103.964 kg), SpO2 98.00%.  Physical Exam  Physical  Exam  Constitutional: She is oriented to person, place, and time.  Obese female in no acute distress  HENT:  Head: Normocephalic and atraumatic.  Right Ear: External ear normal.  Left Ear: External ear normal.  Nose: Nose normal.  Mouth/Throat: Oropharynx is clear and moist. No oropharyngeal exudate.  Eyes: Conjunctivae are normal. Pupils are equal, round, and reactive to light. Right eye exhibits no discharge. Left eye exhibits no discharge. No scleral icterus.  Neck: Normal range of motion. Neck supple. No tracheal deviation present. No thyromegaly present.  Cardiovascular: Normal rate, regular rhythm, normal heart sounds and intact distal pulses.  No murmur heard.  Pulmonary/Chest: Effort normal and breath sounds normal. No respiratory distress. She has no wheezes.   Abdominal: Soft. Bowel sounds are normal. She exhibits no mass. There is no tenderness. There is no rebound.  There is an easily reducible ventral incisional hernia just above the umbilicus  Musculoskeletal: Normal range of motion. She exhibits no edema and no tenderness.  Lymphadenopathy:  She has no cervical adenopathy.  Neurological: She is alert and oriented to person, place, and time.  Skin: Skin is warm and dry. No rash noted. She is not diaphoretic. No erythema.  Psychiatric: Her behavior is normal. Judgment normal.   Data Reviewed  Assessment   Ventral incisional hernia   Plan   Laparoscopic repair with mesh was recommended. I discussed the diagnosis with her in the surgical procedure in detail. I discussed the risks of surgery which includes but is not limited to bleeding, infection, injury to the bowel, need to convert to an open procedure, recurrence, use of mesh, etc. She understands and wishes to proceed with laparoscopic incisional hernia repair with mesh. Likelihood of success is good   Angela Estrada A

## 2012-02-21 ENCOUNTER — Encounter (HOSPITAL_COMMUNITY): Payer: Self-pay | Admitting: Anesthesiology

## 2012-02-21 ENCOUNTER — Encounter (HOSPITAL_COMMUNITY)
Admission: RE | Disposition: A | Discharge status: Home / Self Care | Payer: Self-pay | Source: Ambulatory Visit | Attending: Surgery

## 2012-02-21 ENCOUNTER — Encounter (HOSPITAL_COMMUNITY): Payer: Self-pay

## 2012-02-21 ENCOUNTER — Ambulatory Visit (HOSPITAL_COMMUNITY): Payer: BC Managed Care – PPO | Admitting: Anesthesiology

## 2012-02-21 ENCOUNTER — Observation Stay (HOSPITAL_COMMUNITY)
Admission: RE | Admit: 2012-02-21 | Discharge: 2012-02-23 | Disposition: A | Discharge status: Home / Self Care | Payer: BC Managed Care – PPO | Source: Ambulatory Visit | Attending: Surgery | Admitting: Surgery

## 2012-02-21 ENCOUNTER — Encounter (HOSPITAL_COMMUNITY): Payer: Self-pay | Admitting: *Deleted

## 2012-02-21 DIAGNOSIS — K432 Incisional hernia without obstruction or gangrene: Principal | ICD-10-CM | POA: Insufficient documentation

## 2012-02-21 DIAGNOSIS — I10 Essential (primary) hypertension: Secondary | ICD-10-CM | POA: Insufficient documentation

## 2012-02-21 DIAGNOSIS — Z01812 Encounter for preprocedural laboratory examination: Secondary | ICD-10-CM | POA: Insufficient documentation

## 2012-02-21 DIAGNOSIS — K439 Ventral hernia without obstruction or gangrene: Secondary | ICD-10-CM

## 2012-02-21 HISTORY — PX: INCISIONAL HERNIA REPAIR: SHX193

## 2012-02-21 SURGERY — REPAIR, HERNIA, INCISIONAL, LAPAROSCOPIC
Anesthesia: General | Site: Abdomen | Wound class: Clean

## 2012-02-21 MED ORDER — SIMVASTATIN 10 MG PO TABS
10.0000 mg | ORAL_TABLET | Freq: Every day | ORAL | Status: DC
  Administered 2012-02-22 (×2): 10 mg via ORAL
  Filled 2012-02-21 (×4): qty 1

## 2012-02-21 MED ORDER — FENTANYL CITRATE 0.05 MG/ML IJ SOLN
INTRAMUSCULAR | Status: DC | PRN
  Administered 2012-02-21: 50 ug via INTRAVENOUS
  Administered 2012-02-21: 100 ug via INTRAVENOUS
  Administered 2012-02-21: 50 ug via INTRAVENOUS

## 2012-02-21 MED ORDER — 0.9 % SODIUM CHLORIDE (POUR BTL) OPTIME
TOPICAL | Status: DC | PRN
  Administered 2012-02-21: 1000 mL

## 2012-02-21 MED ORDER — AMLODIPINE BESYLATE 2.5 MG PO TABS
2.5000 mg | ORAL_TABLET | Freq: Every day | ORAL | Status: DC
  Administered 2012-02-22 – 2012-02-23 (×2): 2.5 mg via ORAL
  Filled 2012-02-21 (×2): qty 1

## 2012-02-21 MED ORDER — PROPOFOL 10 MG/ML IV BOLUS
INTRAVENOUS | Status: DC | PRN
  Administered 2012-02-21: 200 mg via INTRAVENOUS

## 2012-02-21 MED ORDER — IBUPROFEN 600 MG PO TABS
600.0000 mg | ORAL_TABLET | Freq: Four times a day (QID) | ORAL | Status: DC | PRN
  Filled 2012-02-21: qty 1

## 2012-02-21 MED ORDER — ASPIRIN EC 81 MG PO TBEC
81.0000 mg | DELAYED_RELEASE_TABLET | Freq: Every day | ORAL | Status: DC
  Administered 2012-02-22 – 2012-02-23 (×3): 81 mg via ORAL
  Filled 2012-02-21 (×4): qty 1

## 2012-02-21 MED ORDER — FENTANYL CITRATE 0.05 MG/ML IJ SOLN
25.0000 ug | INTRAMUSCULAR | Status: DC | PRN
  Administered 2012-02-21 (×3): 50 ug via INTRAVENOUS

## 2012-02-21 MED ORDER — HYDROCHLOROTHIAZIDE 25 MG PO TABS
25.0000 mg | ORAL_TABLET | Freq: Every day | ORAL | Status: DC
  Administered 2012-02-22 (×2): 25 mg via ORAL
  Filled 2012-02-21 (×4): qty 1

## 2012-02-21 MED ORDER — LOSARTAN POTASSIUM-HCTZ 100-25 MG PO TABS: 1.0000 | ORAL_TABLET | Freq: Every day | ORAL | Status: DC

## 2012-02-21 MED ORDER — HYDROCODONE-ACETAMINOPHEN 5-325 MG PO TABS
1.0000 | ORAL_TABLET | ORAL | Status: DC | PRN
  Administered 2012-02-22: 2 via ORAL
  Administered 2012-02-23: 1 via ORAL
  Administered 2012-02-23: 2 via ORAL
  Filled 2012-02-21: qty 2
  Filled 2012-02-21: qty 1
  Filled 2012-02-21: qty 2

## 2012-02-21 MED ORDER — LOSARTAN POTASSIUM 50 MG PO TABS
100.0000 mg | ORAL_TABLET | Freq: Every day | ORAL | Status: DC
  Administered 2012-02-22 – 2012-02-23 (×2): 100 mg via ORAL
  Filled 2012-02-21 (×3): qty 2

## 2012-02-21 MED ORDER — GLYCOPYRROLATE 0.2 MG/ML IJ SOLN
INTRAMUSCULAR | Status: DC | PRN
  Administered 2012-02-21: 0.6 mg via INTRAVENOUS

## 2012-02-21 MED ORDER — LIDOCAINE HCL (CARDIAC) 20 MG/ML IV SOLN
INTRAVENOUS | Status: DC | PRN
  Administered 2012-02-21: 80 mg via INTRAVENOUS

## 2012-02-21 MED ORDER — BUPIVACAINE-EPINEPHRINE PF 0.25-1:200000 % IJ SOLN
INTRAMUSCULAR | Status: AC
  Filled 2012-02-21: qty 30

## 2012-02-21 MED ORDER — LACTATED RINGERS IV SOLN
INTRAVENOUS | Status: DC | PRN
  Administered 2012-02-21 (×2): via INTRAVENOUS

## 2012-02-21 MED ORDER — ROCURONIUM BROMIDE 100 MG/10ML IV SOLN
INTRAVENOUS | Status: DC | PRN
  Administered 2012-02-21: 25 mg via INTRAVENOUS
  Administered 2012-02-21: 15 mg via INTRAVENOUS

## 2012-02-21 MED ORDER — LACTATED RINGERS IV SOLN: INTRAVENOUS | Status: DC

## 2012-02-21 MED ORDER — PROMETHAZINE HCL 25 MG/ML IJ SOLN: 6.2500 mg | INTRAMUSCULAR | Status: DC | PRN

## 2012-02-21 MED ORDER — BUPIVACAINE-EPINEPHRINE 0.25% -1:200000 IJ SOLN
INTRAMUSCULAR | Status: DC | PRN
  Administered 2012-02-21: 30 mL

## 2012-02-21 MED ORDER — ENOXAPARIN SODIUM 40 MG/0.4ML ~~LOC~~ SOLN
40.0000 mg | SUBCUTANEOUS | Status: DC
  Administered 2012-02-22: 40 mg via SUBCUTANEOUS
  Filled 2012-02-21 (×2): qty 0.4

## 2012-02-21 MED ORDER — FENTANYL CITRATE 0.05 MG/ML IJ SOLN
INTRAMUSCULAR | Status: AC
  Filled 2012-02-21: qty 2

## 2012-02-21 MED ORDER — HYDROMORPHONE HCL PF 1 MG/ML IJ SOLN
1.0000 mg | INTRAMUSCULAR | Status: DC | PRN
  Administered 2012-02-21: 1 mg via INTRAVENOUS
  Administered 2012-02-21: 0.5 mg via INTRAVENOUS
  Administered 2012-02-21 – 2012-02-22 (×3): 1 mg via INTRAVENOUS
  Filled 2012-02-21 (×4): qty 1

## 2012-02-21 MED ORDER — ONDANSETRON HCL 4 MG/2ML IJ SOLN
INTRAMUSCULAR | Status: DC | PRN
  Administered 2012-02-21: 4 mg via INTRAVENOUS

## 2012-02-21 MED ORDER — HYDROMORPHONE HCL PF 1 MG/ML IJ SOLN
INTRAMUSCULAR | Status: AC
  Filled 2012-02-21: qty 1

## 2012-02-21 MED ORDER — SODIUM CHLORIDE 0.9 % IV SOLN
INTRAVENOUS | Status: DC
  Administered 2012-02-21: 15:00:00 via INTRAVENOUS
  Administered 2012-02-22: 75 mL/h via INTRAVENOUS
  Administered 2012-02-22: 18:00:00 via INTRAVENOUS

## 2012-02-21 MED ORDER — NEOSTIGMINE METHYLSULFATE 1 MG/ML IJ SOLN
INTRAMUSCULAR | Status: DC | PRN
  Administered 2012-02-21: 4 mg via INTRAVENOUS

## 2012-02-21 MED ORDER — MEPERIDINE HCL 25 MG/ML IJ SOLN: 6.2500 mg | INTRAMUSCULAR | Status: DC | PRN

## 2012-02-21 MED ORDER — MIDAZOLAM HCL 5 MG/5ML IJ SOLN
INTRAMUSCULAR | Status: DC | PRN
  Administered 2012-02-21: 2 mg via INTRAVENOUS

## 2012-02-21 SURGICAL SUPPLY — 48 items
APL SKNCLS STERI-STRIP NONHPOA (GAUZE/BANDAGES/DRESSINGS) ×1
APPLIER CLIP 5 13 M/L LIGAMAX5 (MISCELLANEOUS)
APPLIER CLIP ROT 10 11.4 M/L (STAPLE)
APR CLP MED LRG 11.4X10 (STAPLE)
APR CLP MED LRG 5 ANG JAW (MISCELLANEOUS)
BANDAGE ADHESIVE 1X3 (GAUZE/BANDAGES/DRESSINGS) ×6 IMPLANT
BENZOIN TINCTURE PRP APPL 2/3 (GAUZE/BANDAGES/DRESSINGS) ×2 IMPLANT
CANISTER SUCTION 2500CC (MISCELLANEOUS) IMPLANT
CHLORAPREP W/TINT 26ML (MISCELLANEOUS) ×2 IMPLANT
CLIP APPLIE 5 13 M/L LIGAMAX5 (MISCELLANEOUS) IMPLANT
CLIP APPLIE ROT 10 11.4 M/L (STAPLE) IMPLANT
CLOTH BEACON ORANGE TIMEOUT ST (SAFETY) ×2 IMPLANT
COVER SURGICAL LIGHT HANDLE (MISCELLANEOUS) ×2 IMPLANT
DECANTER SPIKE VIAL GLASS SM (MISCELLANEOUS) ×2 IMPLANT
DEVICE SECURE STRAP 25 ABSORB (INSTRUMENTS) ×3 IMPLANT
DEVICE TROCAR PUNCTURE CLOSURE (ENDOMECHANICALS) ×2 IMPLANT
ELECT REM PT RETURN 9FT ADLT (ELECTROSURGICAL) ×2
ELECTRODE REM PT RTRN 9FT ADLT (ELECTROSURGICAL) ×1 IMPLANT
GLOVE BIO SURGEON STRL SZ7.5 (GLOVE) ×1 IMPLANT
GLOVE BIOGEL PI IND STRL 7.0 (GLOVE) IMPLANT
GLOVE BIOGEL PI IND STRL 7.5 (GLOVE) IMPLANT
GLOVE BIOGEL PI INDICATOR 7.0 (GLOVE) ×1
GLOVE BIOGEL PI INDICATOR 7.5 (GLOVE) ×1
GLOVE SS BIOGEL STRL SZ 6.5 (GLOVE) IMPLANT
GLOVE SUPERSENSE BIOGEL SZ 6.5 (GLOVE) ×1
GLOVE SURG SIGNA 7.5 PF LTX (GLOVE) ×2 IMPLANT
GOWN PREVENTION PLUS XLARGE (GOWN DISPOSABLE) ×2 IMPLANT
GOWN STRL NON-REIN LRG LVL3 (GOWN DISPOSABLE) ×4 IMPLANT
KIT BASIN OR (CUSTOM PROCEDURE TRAY) ×2 IMPLANT
KIT ROOM TURNOVER OR (KITS) ×2 IMPLANT
MESH VENTRALIGHT ST 6IN CRC (Mesh General) ×1 IMPLANT
NDL SPNL 22GX3.5 QUINCKE BK (NEEDLE) ×1 IMPLANT
NEEDLE SPNL 22GX3.5 QUINCKE BK (NEEDLE) ×2 IMPLANT
NS IRRIG 1000ML POUR BTL (IV SOLUTION) ×2 IMPLANT
PAD ARMBOARD 7.5X6 YLW CONV (MISCELLANEOUS) ×2 IMPLANT
PEN SKIN MARKING BROAD (MISCELLANEOUS) ×2 IMPLANT
SCALPEL HARMONIC ACE (MISCELLANEOUS) IMPLANT
SCISSORS LAP 5X35 DISP (ENDOMECHANICALS) IMPLANT
SET IRRIG TUBING LAPAROSCOPIC (IRRIGATION / IRRIGATOR) IMPLANT
SLEEVE ENDOPATH XCEL 5M (ENDOMECHANICALS) ×2 IMPLANT
SUT MON AB 4-0 PC3 18 (SUTURE) ×2 IMPLANT
SUT NOVA NAB GS-21 0 18 T12 DT (SUTURE) ×2 IMPLANT
TOWEL OR 17X24 6PK STRL BLUE (TOWEL DISPOSABLE) ×2 IMPLANT
TOWEL OR 17X26 10 PK STRL BLUE (TOWEL DISPOSABLE) ×2 IMPLANT
TRAY FOLEY CATH 14FR (SET/KITS/TRAYS/PACK) IMPLANT
TRAY LAPAROSCOPIC (CUSTOM PROCEDURE TRAY) ×2 IMPLANT
TROCAR XCEL NON-BLD 11X100MML (ENDOMECHANICALS) ×2 IMPLANT
TROCAR XCEL NON-BLD 5MMX100MML (ENDOMECHANICALS) ×2 IMPLANT

## 2012-02-21 NOTE — Preoperative (Signed)
Beta Blockers   Reason not to administer Beta Blockers:Not Applicable. No home beta blockers 

## 2012-02-21 NOTE — Anesthesia Procedure Notes (Signed)
Procedure Name: Intubation Date/Time: 02/21/2012 9:51 AM Performed by: Garen Lah Pre-anesthesia Checklist: Patient identified, Timeout performed, Emergency Drugs available and Suction available Patient Re-evaluated:Patient Re-evaluated prior to inductionOxygen Delivery Method: Circle system utilized Preoxygenation: Pre-oxygenation with 100% oxygen Intubation Type: IV induction Ventilation: Mask ventilation without difficulty and Oral airway inserted - appropriate to patient size Laryngoscope Size: Mac and 3 Grade View: Grade I Tube type: Oral Tube size: 7.5 mm Number of attempts: 1 Airway Equipment and Method: Stylet

## 2012-02-21 NOTE — Transfer of Care (Signed)
Immediate Anesthesia Transfer of Care Note  Patient: Angela Estrada  Procedure(s) Performed: Procedure(s) (LRB) with comments: LAPAROSCOPIC INCISIONAL HERNIA (N/A) - laparoscopic incisional hernia repair with mesh INSERTION OF MESH (N/A)  Patient Location: PACU  Anesthesia Type: General  Level of Consciousness: awake  Airway & Oxygen Therapy: Patient Spontanous Breathing and Patient connected to nasal cannula oxygen  Post-op Assessment: Report given to PACU RN  Post vital signs: Reviewed and stable  Complications: No apparent anesthesia complications

## 2012-02-21 NOTE — Op Note (Signed)
LAPAROSCOPIC INCISIONAL HERNIA, INSERTION OF MESH  Procedure Note  Angela Estrada 02/21/2012   Pre-op Diagnosis: incisional hernia     Post-op Diagnosis: same  Procedure(s): LAPAROSCOPIC INCISIONAL HERNIA REPAIR INSERTION OF MESH (15.2CM ROUND BARD VENTRALITE) Surgeon(s): Shelly Rubenstein, MD  Anesthesia: General  Staff:  Garret Reddish, RN - Circulator Megan Day Cavanaugh, RN - Scrub Person Doy Mince, RN - Circulator Janeece Agee Pingue, CST - Scrub Person  Estimated Blood Loss: Minimal               Procedure: The patient was brought to the operating room and identified as the correct patient. She was placed supine on the operating table and general anesthesia was induced. Her abdomen was then prepped and draped in the usual sterile fashion. I made Estrada small incision the patient's left upper quadrant with Estrada scalpel. I then used Estrada millimeter trocar and the laparoscopic camera to traverse all levels of the abdominal wall, and through the fascia, and then into the peritoneal cavity under direct vision. Insufflation of the abdomen was then begun. I inspected the introduction site and saw no evidence of bowel injury. The hernia defect was easily identified just above the umbilicus. It was approximately 4 cm in size. I placed Estrada 5 mm port in the Estrada right upper quadrant, Estrada 5 mm port in the patient's right lower quadrant, and Estrada 10 mm port in the patient's left lower quadrant.  I brought Estrada 15.2 cm round piece of Bard  mesh onto the field.  I placed 4 separate 0 Novafil sutures into the corners of the mesh. The mesh was then rolled up and placed the 10 mm port and then unrolled under direct vision and the abdominal cavity. I made 4 separate skin incisions and used the suture passer to pull the sutures up through the abdominal wall. I then tied these in place securing the mesh to the abdominal wall. I then secured the mesh further with the absorbable tackers circumferentially.  Wide coverage of the fascial defect appeared to be achieved. I again examined the abdomen and saw no evidence of bowel injury. Hemostasis appeared to be achieved. All ports were removed under direct vision and the abdomen was deflated. I anesthetized all incisions with Marcaine and then closed them with 4-0 Monocryl subcuticular sutures. Steri-Strips and Band-Aids were then applied. The patient tolerated the procedure well. All the counts were correct at the end of the procedure.  The patient was then extubated in the operating room and taken in Estrada stable condition to the recovery room.          Angela Estrada   Date: 02/21/2012  Time: 10:43 AM

## 2012-02-21 NOTE — Progress Notes (Signed)
Pt walked to the bathroom and proximal dressing started oozing. Rn placed dressing over the top. 2 other areas were oozing but didn't require any occlusive dressing. Will continue to monitor.

## 2012-02-21 NOTE — Interval H&P Note (Signed)
History and Physical Interval Note: no change in h and p  02/21/2012 8:22 AM  Angela Estrada  has presented today for surgery, with the diagnosis of incisional hernia  The various methods of treatment have been discussed with the patient and family. After consideration of risks, benefits and other options for treatment, the patient has consented to  Procedure(s) (LRB) with comments: LAPAROSCOPIC INCISIONAL HERNIA (N/A) - laparoscopic incisional hernia repair with mesh INSERTION OF MESH (N/A) as a surgical intervention .  The patient's history has been reviewed, patient examined, no change in status, stable for surgery.  I have reviewed the patient's chart and labs.  Questions were answered to the patient's satisfaction.     [name]

## 2012-02-21 NOTE — Anesthesia Postprocedure Evaluation (Signed)
  Anesthesia Post-op Note  Patient: Angela Estrada  Procedure(s) Performed: Procedure(s) (LRB): LAPAROSCOPIC INCISIONAL HERNIA (N/A) INSERTION OF MESH (N/A)  Patient Location: PACU  Anesthesia Type: General  Level of Consciousness: awake and alert   Airway and Oxygen Therapy: Patient Spontanous Breathing  Post-op Pain: mild  Post-op Assessment: Post-op Vital signs reviewed, Patient's Cardiovascular Status Stable, Respiratory Function Stable, Patent Airway and No signs of Nausea or vomiting  Post-op Vital Signs: stable  Complications: No apparent anesthesia complications

## 2012-02-21 NOTE — Anesthesia Preprocedure Evaluation (Addendum)
Anesthesia Evaluation  Patient identified by MRN, date of birth, ID band Patient awake    Reviewed: Allergy & Precautions, H&P , NPO status , Patient's Chart, lab work & pertinent test results, reviewed documented beta blocker date and time   Airway Mallampati: II TM Distance: >3 FB Neck ROM: Full    Dental No notable dental hx. (+) Edentulous Upper, Edentulous Lower and Dental Advisory Given   Pulmonary neg pulmonary ROS,  breath sounds clear to auscultation  Pulmonary exam normal       Cardiovascular hypertension, Pt. on medications negative cardio ROS  Rhythm:Regular Rate:Normal     Neuro/Psych negative neurological ROS  negative psych ROS   GI/Hepatic negative GI ROS, Neg liver ROS,   Endo/Other  negative endocrine ROS  Renal/GU negative Renal ROS  negative genitourinary   Musculoskeletal negative musculoskeletal ROS (+) Arthritis -,   Abdominal   Peds negative pediatric ROS (+)  Hematology negative hematology ROS (+)   Anesthesia Other Findings   Reproductive/Obstetrics negative OB ROS                         Anesthesia Physical Anesthesia Plan  ASA: II  Anesthesia Plan: General   Post-op Pain Management:    Induction: Intravenous  Airway Management Planned: Oral ETT  Additional Equipment:   Intra-op Plan:   Post-operative Plan: Extubation in OR  Informed Consent: I have reviewed the patients History and Physical, chart, labs and discussed the procedure including the risks, benefits and alternatives for the proposed anesthesia with the patient or authorized representative who has indicated his/her understanding and acceptance.   Dental advisory given  Plan Discussed with: CRNA  Anesthesia Plan Comments:        Anesthesia Quick Evaluation

## 2012-02-22 MED ORDER — DIPHENHYDRAMINE HCL 25 MG PO CAPS
50.0000 mg | ORAL_CAPSULE | Freq: Four times a day (QID) | ORAL | Status: DC | PRN
  Administered 2012-02-22: 50 mg via ORAL
  Filled 2012-02-22: qty 2

## 2012-02-22 MED ORDER — HYDROCODONE-ACETAMINOPHEN 5-325 MG PO TABS: 1.0000 | ORAL_TABLET | ORAL | Status: AC | PRN

## 2012-02-22 NOTE — Progress Notes (Signed)
1 Day Post-Op  Subjective: POD#1 Complains of soreness Voiding well Not yet ambulating  Objective: Vital signs in last 24 hours: Temp:  [97.4 F (36.3 C)-98.5 F (36.9 C)] 97.4 F (36.3 C) (09/13 0557) Pulse Rate:  [51-79] 56  (09/13 0557) Resp:  [13-19] 16  (09/13 0557) BP: (108-157)/(56-83) 135/70 mmHg (09/13 0557) SpO2:  [91 %-98 %] 97 % (09/13 0557) FiO2 (%):  [2 %] 2 % (09/12 1841) Last BM Date: 02/20/12  Intake/Output from previous day: 09/12 0701 - 09/13 0700 In: 3001.3 [P.O.:600; I.V.:2401.3] Out: 0  Intake/Output this shift:    Lungs clear Abdomen soft, appropriately tender  Lab Results:  No results found for this basename: WBC:2,HGB:2,HCT:2,PLT:2 in the last 72 hours BMET No results found for this basename: NA:2,K:2,CL:2,CO2:2,GLUCOSE:2,BUN:2,CREATININE:2,CALCIUM:2 in the last 72 hours PT/INR No results found for this basename: LABPROT:2,INR:2 in the last 72 hours ABG No results found for this basename: PHART:2,PCO2:2,PO2:2,HCO3:2 in the last 72 hours  Studies/Results: No results found.  Anti-infectives: Anti-infectives     Start     Dose/Rate Route Frequency Ordered Stop   02/20/12 1418   ceFAZolin (ANCEF) IVPB 2 g/50 mL premix        2 g 100 mL/hr over 30 Minutes Intravenous 60 min pre-op 02/20/12 1418 02/21/12 0945          Assessment/Plan: s/p Procedure(s) (LRB) with comments: LAPAROSCOPIC INCISIONAL HERNIA (N/A) - laparoscopic incisional hernia repair with mesh INSERTION OF MESH (N/A)  Still requiring IV pain meds so will keep for 1 more day ambulate  LOS: 1 day    Twila Rappa A 02/22/2012

## 2012-02-23 NOTE — Progress Notes (Signed)
Assessment unchanged.  Scripts were given per MD order.  All questions pertaining to D/C we answered.  Pt was D/C'd via wheelchair and accompanied by volunteer.   Angela Estrada

## 2012-02-23 NOTE — Discharge Summary (Signed)
Physician Discharge Summary  Patient ID: Angela Estrada MRN: [id number] DOB/AGE: November 16, 1950 61 y.o.  Admit date: 02/21/2012 Discharge date: 02/23/2012  Admission Diagnoses:Ventral hernia   Discharge Diagnoses: s/p [facility]  Active Problems:  * No active hospital problems. *    Discharged Condition: good  Hospital Course: Pt underwent VHR, please see full OP report for details.  Post opst the patient was sent to the floor, and started on CLD and adv to a reg diet which the patietn was abel to tol well.  Pt had good pain control on d/c, was afebrile, ambulating on her own, and deemed stable for home.  Consults: None    Discharge Exam: Blood pressure 138/66, pulse 62, temperature 98.2 F (36.8 C), temperature source Oral, resp. rate 18, height 5\' 4"  (1.626 m), weight 160 lb (72.576 kg), SpO2 94.00%. GEN: NAD Abd: s/nt/nd/wounds c/d/i  Disposition:   Discharge Orders    Future Appointments: Provider: Department: Dept Phone: Center:   03/11/2012 10:50 AM Shelly Rubenstein, MD [facility] [phone] None     Future Orders Please Complete By Expires   Diet - low sodium heart healthy      Increase activity slowly          Medication List     As of 02/23/2012  9:14 AM    TAKE these medications         amLODipine 2.5 MG tablet   Commonly known as: NORVASC   Take 2.5 mg by mouth daily.      aspirin EC 81 MG tablet   Take 81 mg by mouth daily.      fish oil-omega-3 fatty acids 1000 MG capsule   Take 1 g by mouth daily.      HYDROcodone-acetaminophen 5-325 MG per tablet   Commonly known as: NORCO/VICODIN   Take 1-2 tablets by mouth every 4 (four) hours as needed.      losartan-hydrochlorothiazide 100-25 MG per tablet   Commonly known as: HYZAAR   Take 1 tablet by mouth daily.      mometasone 50 MCG/ACT nasal spray   Commonly known as: NASONEX   Place 2 sprays into the nose daily as needed. As needed for nasal allergies      multivitamin with minerals Tabs   Take 1 tablet by mouth daily.      pravastatin 40 MG tablet   Commonly known as: PRAVACHOL   Take 40 mg by mouth daily.           Follow-up Information    Follow up with [facility] A, MD. Call in 3 weeks.   Contact information:   [address] [place] [address] [phone]          Signed: Marigene Ehlers., Jed Limerick 02/23/2012, 9:14 AM

## 2012-02-25 ENCOUNTER — Encounter (HOSPITAL_COMMUNITY): Payer: Self-pay | Admitting: Surgery

## 2012-03-11 ENCOUNTER — Encounter (INDEPENDENT_AMBULATORY_CARE_PROVIDER_SITE_OTHER): Payer: Self-pay | Admitting: Surgery

## 2012-03-11 ENCOUNTER — Ambulatory Visit (INDEPENDENT_AMBULATORY_CARE_PROVIDER_SITE_OTHER): Payer: BC Managed Care – PPO | Admitting: Surgery

## 2012-03-11 VITALS — BP 144/82 | HR 68 | Temp 97.3°F | Resp 16 | Ht 64.0 in | Wt 225.0 lb

## 2012-03-11 DIAGNOSIS — Z09 Encounter for follow-up examination after completed treatment for conditions other than malignant neoplasm: Secondary | ICD-10-CM

## 2012-03-11 NOTE — Progress Notes (Signed)
Subjective:     Patient ID: Angela Estrada, female   DOB: Dec 01, 1950, 61 y.o.   MRN: [id number]  HPI She is here for her first postoperative visit status post laparoscopic incisional hernia repair with mesh. She is doing well except for mild postoperative discomfort  Review of Systems     Objective:   Physical Exam    On exam, her incisions are well healed and there is no evidence of recurrent hernia Assessment:     Patient stable postop    Plan:     She may return to normal activity. I renewed her hydrocodone one more time. I will see her back as needed

## 2012-05-20 ENCOUNTER — Ambulatory Visit (INDEPENDENT_AMBULATORY_CARE_PROVIDER_SITE_OTHER): Payer: BC Managed Care – PPO

## 2012-05-20 DIAGNOSIS — Z23 Encounter for immunization: Secondary | ICD-10-CM

## 2012-07-04 ENCOUNTER — Encounter: Payer: Self-pay | Admitting: Endocrinology

## 2012-07-04 ENCOUNTER — Ambulatory Visit (INDEPENDENT_AMBULATORY_CARE_PROVIDER_SITE_OTHER): Payer: BC Managed Care – PPO | Admitting: Endocrinology

## 2012-07-04 VITALS — BP 152/80 | HR 76 | Temp 98.0°F | Resp 16 | Wt 226.0 lb

## 2012-07-04 DIAGNOSIS — J069 Acute upper respiratory infection, unspecified: Secondary | ICD-10-CM

## 2012-07-04 MED ORDER — CEFUROXIME AXETIL 250 MG PO TABS: 250.0000 mg | ORAL_TABLET | Freq: Two times a day (BID) | ORAL | Status: AC

## 2012-07-04 MED ORDER — TRAMADOL HCL 50 MG PO TABS: 50.0000 mg | ORAL_TABLET | Freq: Four times a day (QID) | ORAL | Status: DC | PRN

## 2012-07-04 NOTE — Progress Notes (Signed)
  Subjective:    Patient ID: Angela Estrada, female    DOB: 12/26/1950, 62 y.o.   MRN: [id number]  HPI Pt states 2 weeks of moderate dry-quality cough in the chest, and assoc nasal congestion.   Past Medical History  Diagnosis Date  . ALLERGIC RHINITIS 01/03/2007  . COUGH DUE TO ACE INHIBITORS 08/12/2008    Due to Accupril  . Dizziness and giddiness 04/25/2007  . HYPERCHOLESTEROLEMIA 04/25/2007    rx refused  . HYPERGLYCEMIA 08/12/2008  . HYPERTENSION 01/03/2007  . Arthritis     Past Surgical History  Procedure Date  . Tubal ligation   . Cholecystectomy   . Joint replacement     bilateral knee replacements  . Replacement total knee bilateral 2010  . Incisional hernia repair 02/21/2012    Procedure: LAPAROSCOPIC INCISIONAL HERNIA;  Surgeon: Shelly Rubenstein, MD;  Location: [facility] OR;  Service: General;  Laterality: N/A;  laparoscopic incisional hernia repair with mesh    History   Social History  . Marital Status: Married    Spouse Name: N/A    Number of Children: N/A  . Years of Education: N/A   Occupational History  . Beautician    Social History Main Topics  . Smoking status: Never Smoker   . Smokeless tobacco: Not on file  . Alcohol Use: No  . Drug Use: Not on file  . Sexually Active: Yes   Other Topics Concern  . Not on file   Social History Narrative  . No narrative on file    Current Outpatient Prescriptions on File Prior to Visit  Medication Sig Dispense Refill  . amLODipine (NORVASC) 2.5 MG tablet Take 2.5 mg by mouth daily.      Marland Kitchen aspirin EC 81 MG tablet Take 81 mg by mouth daily.      . fish oil-omega-3 fatty acids 1000 MG capsule Take 1 g by mouth daily.      Marland Kitchen losartan-hydrochlorothiazide (HYZAAR) 100-25 MG per tablet Take 1 tablet by mouth daily.      . mometasone (NASONEX) 50 MCG/ACT nasal spray Place 2 sprays into the nose daily as needed. As needed for nasal allergies      . Multiple Vitamin (MULTIVITAMIN WITH MINERALS) TABS Take 1 tablet by  mouth daily.      . pravastatin (PRAVACHOL) 40 MG tablet Take 40 mg by mouth daily.        Allergies  Allergen Reactions  . Lisinopril Cough  . Oxycodone Other (See Comments)    Hallucinations    Family History  Problem Relation Age of Onset  . Cancer Neg Hx    BP 152/80  Pulse 76  Temp 98 F (36.7 C) (Oral)  Resp 16  Wt 226 lb (102.513 kg)  SpO2 96%  Review of Systems Denies fever and earache.      Objective:   Physical Exam VITAL SIGNS:  See vs page GENERAL: no distress head: no deformity eyes: no periorbital swelling, no proptosis external nose and ears are normal mouth: no lesion seen Right tm is red: left is normal LUNGS:  Clear to auscultation    Assessment & Plan:  URI, new HTN, with probable situational component

## 2012-07-04 NOTE — Patient Instructions (Addendum)
i have sent 2 prescriptions to your pharmacy, for an antibiotic pill and a cough pill. Loratadine-d (non-prescription) will help your congestion. I hope you feel better soon.  If you don't feel better in a week, please call back.  Please call sooner if you get worse. We'll recheck your blood pressure at your next regular physical, after 01/22/13.

## 2012-08-29 ENCOUNTER — Encounter: Payer: Self-pay | Admitting: Obstetrics and Gynecology

## 2012-08-29 ENCOUNTER — Ambulatory Visit (INDEPENDENT_AMBULATORY_CARE_PROVIDER_SITE_OTHER): Payer: BC Managed Care – PPO | Admitting: Obstetrics and Gynecology

## 2012-08-29 VITALS — BP 132/82 | Ht 64.0 in | Wt 225.0 lb

## 2012-08-29 DIAGNOSIS — Z1231 Encounter for screening mammogram for malignant neoplasm of breast: Secondary | ICD-10-CM

## 2012-08-29 DIAGNOSIS — M1990 Unspecified osteoarthritis, unspecified site: Secondary | ICD-10-CM | POA: Insufficient documentation

## 2012-08-29 DIAGNOSIS — Z01419 Encounter for gynecological examination (general) (routine) without abnormal findings: Secondary | ICD-10-CM

## 2012-08-29 NOTE — Progress Notes (Signed)
Patient ID: Angela Estrada, female   DOB: 12/22/1950, 62 y.o.   MRN: [id number]  62 y.o. MarriedAfrican American female   G1P1001 here for annual exam.    Patient's last menstrual period was 08/30/1998.          Sexually active: yes.  Denies pain or dryness with intercourse.  The current method of family planning is post menopausal status.    Exercising: yes  Home exercise routine includes treadmill.  Does light strength training. Last mammogram:  2012 - normal. SBE - Doesn't do regularly. Last pap smear:  2012 - normal History of abnormal pap:  Doesn't remember any abnormal paps. Smoking:  Quit over 35 - 40 years ago.   Alcohol:  None. Last colonoscopy:  3 -4 years ago, [facility] - normal.   Next due in 10 years from last exam. Last Bone Density:  2012 - normal.  Does routine labs with Dr. George Hugh office.  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  Health Maintenance  Topic Date Due  . Tetanus/tdap  08/05/2011  . Zostavax  07/21/2012  . Influenza Vaccine  02/09/2013  . Mammogram  05/11/2013  . Pap Smear  05/23/2014  . Colonoscopy  10/27/2016    Family History  Problem Relation Age of Onset  . Cancer Neg Hx   . Diabetes Mother   . Heart disease Mother   . Diabetes Father   . Heart disease Father     Triple Bypass  . Heart attack Brother   . Congestive Heart Failure Brother     Patient Active Problem List  Diagnosis  . HYPERCHOLESTEROLEMIA  . HYPERTENSION  . URI  . ALLERGIC RHINITIS  . PAIN IN SOFT TISSUES OF LIMB  . DIZZINESS AND GIDDINESS  . HYPERGLYCEMIA  . Encounter for long-term (current) use of other medications  . Cough  . Acute otitis media  . Ventral hernia  . Arthritis    Past Medical History  Diagnosis Date  . ALLERGIC RHINITIS 01/03/2007  . COUGH DUE TO ACE INHIBITORS 08/12/2008    Due to Accupril  . Dizziness and giddiness 04/25/2007  . HYPERCHOLESTEROLEMIA 04/25/2007    rx refused  . HYPERGLYCEMIA 08/12/2008  . HYPERTENSION  01/03/2007  . Arthritis   . Fibroid   . Arthritis     Past Surgical History  Procedure Laterality Date  . Tubal ligation    . Cholecystectomy    . Joint replacement      bilateral knee replacements  . Replacement total knee bilateral  2010  . Incisional hernia repair  02/21/2012    Procedure: LAPAROSCOPIC INCISIONAL HERNIA;  Surgeon: Shelly Rubenstein, MD;  Location: [facility] OR;  Service: General;  Laterality: N/A;  laparoscopic incisional hernia repair with mesh  . Cesarean section  25 years ago  . Dilation and curettage of uterus  35 years ago    Allergies: Lisinopril and Oxycodone  Current Outpatient Prescriptions  Medication Sig Dispense Refill  . amLODipine (NORVASC) 2.5 MG tablet Take 2.5 mg by mouth daily.      Marland Kitchen aspirin EC 81 MG tablet Take 81 mg by mouth daily.      . fish oil-omega-3 fatty acids 1000 MG capsule Take 1 g by mouth daily.      Marland Kitchen losartan-hydrochlorothiazide (HYZAAR) 100-25 MG per tablet Take 1 tablet by mouth daily.      . mometasone (NASONEX) 50 MCG/ACT nasal spray Place 2 sprays into the nose daily as needed. As needed for nasal allergies      .  Multiple Vitamin (MULTIVITAMIN WITH MINERALS) TABS Take 1 tablet by mouth daily.      . pravastatin (PRAVACHOL) 40 MG tablet Take 40 mg by mouth daily.       No current facility-administered medications for this visit.    ROS: Pertinent items are noted in HPI.  Exam:    BP 132/82  Ht 5\' 4"  (1.626 m)  Wt 225 lb (102.059 kg)  BMI 38.6 kg/m2  LMP 08/30/1998   Wt Readings from Last 3 Encounters:  08/29/12 225 lb (102.059 kg)  07/04/12 226 lb (102.513 kg)  03/11/12 225 lb (102.059 kg)     Ht Readings from Last 3 Encounters:  08/29/12 5\' 4"  (1.626 m)  03/11/12 5\' 4"  (1.626 m)  02/22/12 5\' 4"  (1.626 m)    General appearance: alert, cooperative and appears stated age Head: Normocephalic, without obvious abnormality, atraumatic Neck: no adenopathy, supple, symmetrical, trachea midline and thyroid not  enlarged, symmetric, no tenderness/mass/nodules Lungs: clear to auscultation bilaterally Breasts: Inspection negative, No nipple retraction or dimpling, No nipple discharge or bleeding, No axillary or supraclavicular adenopathy, Normal to palpation without dominant masses Heart: regular rate and rhythm Abdomen: soft, non-tender; bowel sounds normal; no masses,  no organomegaly Extremities: extremities normal, atraumatic, no cyanosis or edema Skin: Skin color, texture, turgor normal. No rashes or lesions Lymph nodes: Cervical, supraclavicular, and axillary nodes normal. No abnormal inguinal nodes palpated Neurologic: Grossly normal   Pelvic: External genitalia:  no lesions              Urethra:  normal appearing urethra with no masses, tenderness or lesions              Bartholins and Skenes: normal                 Vagina: normal appearing vagina with normal color and discharge, no lesions              Cervix: normal appearance              Pap taken: yes.  HR HPV testing  done.        Bimanual Exam:  Uterus:  uterus is normal size, shape, consistency and nontender                                      Adnexa: normal adnexa in size, nontender and no masses                                      Rectovaginal: Confirms                                      Anus:  normal sphincter tone, no lesions  A: well woman     P: pap smear and HR HPV testing. [facility]. Weight loss. return annually or prn     An After Visit Summary was printed and given to the patient.

## 2012-08-29 NOTE — Patient Instructions (Addendum)
EXERCISE AND DIET:  We recommended that you start or continue a regular exercise program for good health. Regular exercise means any activity that makes your heart beat faster and makes you sweat.  We recommend exercising at least 30 minutes per day at least 3 days a week, preferably 4 or 5.  We also recommend a diet low in fat and sugar.  Inactivity, poor dietary choices and obesity can cause diabetes, heart attack, stroke, and kidney damage, among others.    ALCOHOL AND SMOKING:  Women should limit their alcohol intake to no more than 7 drinks/beers/glasses of wine (combined, not each!) per week. Moderation of alcohol intake to this level decreases your risk of breast cancer and liver damage. And of course, no recreational drugs are part of a healthy lifestyle.  And absolutely no smoking or even second hand smoke. Most people know smoking can cause heart and lung diseases, but did you know it also contributes to weakening of your bones? Aging of your skin?  Yellowing of your teeth and nails?  CALCIUM AND VITAMIN D:  Adequate intake of calcium and Vitamin D are recommended.  The recommendations for exact amounts of these supplements seem to change often, but generally speaking 600 mg of calcium (either carbonate or citrate) and 800 units of Vitamin D per day seems prudent. Certain women may benefit from higher intake of Vitamin D.  If you are among these women, your doctor will have told you during your visit.    PAP SMEARS:  Pap smears, to check for cervical cancer or precancers,  have traditionally been done yearly, although recent scientific advances have shown that most women can have pap smears less often.  However, every woman still should have a physical exam from her gynecologist every year. It will include a breast check, inspection of the vulva and vagina to check for abnormal growths or skin changes, a visual exam of the cervix, and then an exam to evaluate the size and shape of the uterus and  ovaries.  And after 60 years of age, a rectal exam is indicated to check for rectal cancers. We will also provide age appropriate advice regarding health maintenance, like when you should have certain vaccines, screening for sexually transmitted diseases, bone density testing, colonoscopy, mammograms, etc.   MAMMOGRAMS:  All women over 40 years old should have a yearly mammogram. Many facilities now offer a "3D" mammogram, which may cost around $50 extra out of pocket. If possible,  we recommend you accept the option to have the 3D mammogram performed.  It both reduces the number of women who will be called back for extra views which then turn out to be normal, and it is better than the routine mammogram at detecting truly abnormal areas.    COLONOSCOPY:  Colonoscopy to screen for colon cancer is recommended for all women at age 50.  We know, you hate the idea of the prep.  We agree, BUT, having colon cancer and not knowing it is worse!!  Colon cancer so often starts as a polyp that can be seen and removed at colonscopy, which can quite literally save your life!  And if your first colonoscopy is normal and you have no family history of colon cancer, most women don't have to have it again for 10 years.  Once every ten years, you can do something that may end up saving your life, right?  We will be happy to help you get it scheduled when you are ready.    Be sure to check your insurance coverage so you understand how much it will cost.  It may be covered as a preventative service at no cost, but you should check your particular policy.    Calorie Counting Diet A calorie counting diet requires you to eat the number of calories that are right for you in a day. Calories are the measurement of how much energy you get from the food you eat. Eating the right amount of calories is important for staying at a healthy weight. If you eat too many calories, your body will store them as fat and you may gain weight. If you eat  too few calories, you may lose weight. Counting the number of calories you eat during a day will help you know if you are eating the right amount. A Registered Dietitian can determine how many calories you need in a day. The amount of calories needed varies from person to person. If your goal is to lose weight, you will need to eat fewer calories. Losing weight can benefit you if you are overweight or have health problems such as heart disease, high blood pressure, or diabetes. If your goal is to gain weight, you will need to eat more calories. Gaining weight may be necessary if you have a certain health problem that causes your body to need more energy. TIPS Whether you are increasing or decreasing the number of calories you eat during a day, it may be hard to get used to changes in what you eat and drink. The following are tips to help you keep track of the number of calories you eat.  Measure foods at home with measuring cups. This helps you know the amount of food and number of calories you are eating.  Restaurants often serve food in amounts that are larger than 1 serving. While eating out, estimate how many servings of a food you are given. For example, a serving of cooked rice is  cup or about the size of half of a fist. Knowing serving sizes will help you be aware of how much food you are eating at restaurants.  Ask for smaller portion sizes or child-size portions at restaurants.  Plan to eat half of a meal at a restaurant. Take the rest home or share the other half with a friend.  Read the Nutrition Facts panel on food labels for calorie content and serving size. You can find out how many servings are in a package, the size of a serving, and the number of calories each serving has.  For example, a package might contain 3 cookies. The Nutrition Facts panel on that package says that 1 serving is 1 cookie. Below that, it will say there are 3 servings in the container. The calories section of  the Nutrition Facts label says there are 90 calories. This means there are 90 calories in 1 cookie (1 serving). If you eat 1 cookie you have eaten 90 calories. If you eat all 3 cookies, you have eaten 270 calories (3 servings x 90 calories = 270 calories). The list below tells you how big or small some common portion sizes are.  1 oz.........4 stacked dice.  3 oz........Marland KitchenDeck of cards.  1 tsp.......Marland KitchenTip of little finger.  1 tbs......Marland KitchenMarland KitchenThumb.  2 tbs.......Marland KitchenGolf ball.   cup......Marland KitchenHalf of a fist.  1 cup.......Marland KitchenA fist. KEEP A FOOD LOG Write down every food item you eat, the amount you eat, and the number of calories in each food you eat during the  day. At the end of the day, you can add up the total number of calories you have eaten. It may help to keep a list like the one below. Find out the calorie information by reading the Nutrition Facts panel on food labels. Breakfast  Bran cereal (1 cup, 110 calories).  Fat-free milk ( cup, 45 calories). Snack  Apple (1 medium, 80 calories). Lunch  Spinach (1 cup, 20 calories).  Tomato ( medium, 20 calories).  Chicken breast strips (3 oz, 165 calories).  Shredded cheddar cheese ( cup, 110 calories).  Light [place] dressing (2 tbs, 60 calories).  Whole-wheat bread (1 slice, 80 calories).  Tub margarine (1 tsp, 35 calories).  Vegetable soup (1 cup, 160 calories). Dinner  Pork chop (3 oz, 190 calories).  Brown rice (1 cup, 215 calories).  Steamed broccoli ( cup, 20 calories).  Strawberries (1  cup, 65 calories).  Whipped cream (1 tbs, 50 calories). Daily Calorie Total: 1425 Document Released: 05/28/2005 Document Revised: 08/20/2011 Document Reviewed: 11/22/2006 [facility] Patient Information 2013 [place], [place]. Exercise to Lose Weight Exercise and a healthy diet may help you lose weight. Your doctor may suggest specific exercises. EXERCISE IDEAS AND TIPS  Choose low-cost things you enjoy doing, such as walking,  bicycling, or exercising to workout videos.  Take stairs instead of the elevator.  Walk during your lunch break.  Park your car further away from work or school.  Go to a gym or an exercise class.  Start with 5 to 10 minutes of exercise each day. Build up to 30 minutes of exercise 4 to 6 days a week.  Wear shoes with good support and comfortable clothes.  Stretch before and after working out.  Work out until you breathe harder and your heart beats faster.  Drink extra water when you exercise.  Do not do so much that you hurt yourself, feel dizzy, or get very short of breath. Exercises that burn about 150 calories:  Running 1  miles in 15 minutes.  Playing volleyball for 45 to 60 minutes.  Washing and waxing a car for 45 to 60 minutes.  Playing touch football for 45 minutes.  Walking 1  miles in 35 minutes.  Pushing a stroller 1  miles in 30 minutes.  Playing basketball for 30 minutes.  Raking leaves for 30 minutes.  Bicycling 5 miles in 30 minutes.  Walking 2 miles in 30 minutes.  Dancing for 30 minutes.  Shoveling snow for 15 minutes.  Swimming laps for 20 minutes.  Walking up stairs for 15 minutes.  Bicycling 4 miles in 15 minutes.  Gardening for 30 to 45 minutes.  Jumping rope for 15 minutes.  Washing windows or floors for 45 to 60 minutes. Document Released: 06/30/2010 Document Revised: 08/20/2011 Document Reviewed: 06/30/2010 [facility] Patient Information 2013 [place], [place].

## 2012-09-12 ENCOUNTER — Encounter: Payer: Self-pay | Admitting: Obstetrics and Gynecology

## 2012-10-06 ENCOUNTER — Ambulatory Visit
Admission: RE | Admit: 2012-10-06 | Discharge: 2012-10-06 | Disposition: A | Discharge status: Home / Self Care | Payer: BC Managed Care – PPO | Source: Ambulatory Visit | Attending: Obstetrics and Gynecology | Admitting: Obstetrics and Gynecology

## 2012-10-06 DIAGNOSIS — Z1231 Encounter for screening mammogram for malignant neoplasm of breast: Secondary | ICD-10-CM

## 2012-10-20 ENCOUNTER — Other Ambulatory Visit: Payer: Self-pay

## 2012-10-20 MED ORDER — PRAVASTATIN SODIUM 40 MG PO TABS: 40.0000 mg | ORAL_TABLET | Freq: Every day | ORAL | Status: DC

## 2013-02-05 ENCOUNTER — Other Ambulatory Visit: Payer: Self-pay | Admitting: *Deleted

## 2013-02-05 MED ORDER — LOSARTAN POTASSIUM-HCTZ 100-25 MG PO TABS: 1.0000 | ORAL_TABLET | Freq: Every day | ORAL | Status: DC

## 2013-02-05 MED ORDER — AMLODIPINE BESYLATE 2.5 MG PO TABS: 2.5000 mg | ORAL_TABLET | Freq: Every day | ORAL | Status: DC

## 2013-03-09 ENCOUNTER — Ambulatory Visit (INDEPENDENT_AMBULATORY_CARE_PROVIDER_SITE_OTHER): Payer: BC Managed Care – PPO | Admitting: Endocrinology

## 2013-03-09 ENCOUNTER — Telehealth: Payer: Self-pay | Admitting: Endocrinology

## 2013-03-09 ENCOUNTER — Encounter: Payer: Self-pay | Admitting: Endocrinology

## 2013-03-09 VITALS — BP 128/74 | HR 77 | Ht 65.0 in | Wt 226.0 lb

## 2013-03-09 DIAGNOSIS — E7800 Pure hypercholesterolemia, unspecified: Secondary | ICD-10-CM

## 2013-03-09 DIAGNOSIS — R8281 Pyuria: Secondary | ICD-10-CM

## 2013-03-09 DIAGNOSIS — I10 Essential (primary) hypertension: Secondary | ICD-10-CM

## 2013-03-09 DIAGNOSIS — R7309 Other abnormal glucose: Secondary | ICD-10-CM

## 2013-03-09 DIAGNOSIS — R42 Dizziness and giddiness: Secondary | ICD-10-CM

## 2013-03-09 DIAGNOSIS — K769 Liver disease, unspecified: Secondary | ICD-10-CM | POA: Insufficient documentation

## 2013-03-09 DIAGNOSIS — Z79899 Other long term (current) drug therapy: Secondary | ICD-10-CM

## 2013-03-09 LAB — BASIC METABOLIC PANEL
BUN: 7 mg/dL (ref 6–23)
CO2: 33 mEq/L — ABNORMAL HIGH (ref 19–32)
Calcium: 9.3 mg/dL (ref 8.4–10.5)
Chloride: 100 mEq/L (ref 96–112)
Creatinine, Ser: 0.7 mg/dL (ref 0.4–1.2)
GFR: 104.36 mL/min (ref >60.00)
Glucose, Bld: 106 mg/dL — ABNORMAL HIGH (ref 70–99)
Potassium: 3.4 mEq/L — ABNORMAL LOW (ref 3.5–5.1)
Sodium: 138 mEq/L (ref 135–145)

## 2013-03-09 LAB — URINALYSIS, ROUTINE W REFLEX MICROSCOPIC
Bilirubin Urine: NEGATIVE
Ketones, ur: NEGATIVE
Nitrite: NEGATIVE
Specific Gravity, Urine: <=1.005 (ref 1.000–1.030)
Total Protein, Urine: NEGATIVE
Urine Glucose: NEGATIVE
Urobilinogen, UA: 0.2 (ref 0.0–1.0)
pH: 6 (ref 5.0–8.0)

## 2013-03-09 LAB — CBC WITH DIFFERENTIAL/PLATELET
Basophils Absolute: 0 10*3/uL (ref 0.0–0.1)
Basophils Relative: 0.4 % (ref 0.0–3.0)
Eosinophils Absolute: 0.1 10*3/uL (ref 0.0–0.7)
Eosinophils Relative: 1.8 % (ref 0.0–5.0)
HCT: 42 % (ref 36.0–46.0)
Hemoglobin: 14.3 g/dL (ref 12.0–15.0)
Lymphocytes Relative: 19 % (ref 12.0–46.0)
Lymphs Abs: 1.4 10*3/uL (ref 0.7–4.0)
MCHC: 34.1 g/dL (ref 30.0–36.0)
MCV: 89.1 fl (ref 78.0–100.0)
Monocytes Absolute: 0.7 10*3/uL (ref 0.1–1.0)
Monocytes Relative: 10.4 % (ref 3.0–12.0)
Neutro Abs: 4.9 10*3/uL (ref 1.4–7.7)
Neutrophils Relative %: 68.4 % (ref 43.0–77.0)
Platelets: 198 10*3/uL (ref 150.0–400.0)
RBC: 4.71 Mil/uL (ref 3.87–5.11)
RDW: 13.2 % (ref 11.5–14.6)
WBC: 7.2 10*3/uL (ref 4.5–10.5)

## 2013-03-09 LAB — HEPATIC FUNCTION PANEL
ALT: 41 U/L — ABNORMAL HIGH (ref 0–35)
AST: 50 U/L — ABNORMAL HIGH (ref 0–37)
Albumin: 3.8 g/dL (ref 3.5–5.2)
Alkaline Phosphatase: 60 U/L (ref 39–117)
Bilirubin, Direct: 0 mg/dL (ref 0.0–0.3)
Total Bilirubin: 0.6 mg/dL (ref 0.3–1.2)
Total Protein: 7.9 g/dL (ref 6.0–8.3)

## 2013-03-09 LAB — HEMOGLOBIN A1C: Hgb A1c MFr Bld: 6.4 % (ref 4.6–6.5)

## 2013-03-09 LAB — LIPID PANEL
Cholesterol: 180 mg/dL (ref 0–200)
HDL: 52.5 mg/dL (ref >39.00)
LDL Cholesterol: 109 mg/dL — ABNORMAL HIGH (ref 0–99)
Total CHOL/HDL Ratio: 3
Triglycerides: 93 mg/dL (ref 0.0–149.0)
VLDL: 18.6 mg/dL (ref 0.0–40.0)

## 2013-03-09 LAB — TSH: TSH: 0.55 u[IU]/mL (ref 0.35–5.50)

## 2013-03-09 MED ORDER — BENZONATATE 200 MG PO CAPS: 200.0000 mg | ORAL_CAPSULE | Freq: Two times a day (BID) | ORAL | Status: DC | PRN

## 2013-03-09 MED ORDER — LOSARTAN POTASSIUM-HCTZ 100-25 MG PO TABS: 1.0000 | ORAL_TABLET | Freq: Every day | ORAL | Status: DC

## 2013-03-09 MED ORDER — FLUTICASONE-SALMETEROL 100-50 MCG/DOSE IN AEPB: 1.0000 | INHALATION_SPRAY | Freq: Two times a day (BID) | RESPIRATORY_TRACT | Status: DC

## 2013-03-09 MED ORDER — MOMETASONE FUROATE 50 MCG/ACT NA SUSP: 2.0000 | Freq: Every day | NASAL | Status: DC | PRN

## 2013-03-09 MED ORDER — AMLODIPINE BESYLATE 2.5 MG PO TABS: 2.5000 mg | ORAL_TABLET | Freq: Every day | ORAL | Status: DC

## 2013-03-09 NOTE — Patient Instructions (Addendum)
blood tests are being requested for you today.  We'll contact you with results. i have sent 3 prescriptions to your pharmacy: nasal spray, inhaler, and cough medicine.  Please come in soon for a regular physical.

## 2013-03-09 NOTE — Telephone Encounter (Signed)
Request sent to the lab.  

## 2013-03-09 NOTE — Telephone Encounter (Signed)
Can they also do urine c/s on the urine they have?

## 2013-03-09 NOTE — Progress Notes (Signed)
Subjective:    Patient ID: Angela Estrada, female    DOB: [birth year], 62 y.o.   MRN: [id number]  HPI Pt states 1 week of a dry-quality cough in the chest, but no assoc sob. Pt says she needs a refill of her zocor. Past Medical History  Diagnosis Date  . ALLERGIC RHINITIS 01/03/2007  . COUGH DUE TO ACE INHIBITORS 08/12/2008    Due to Accupril  . Dizziness and giddiness 04/25/2007  . HYPERCHOLESTEROLEMIA 04/25/2007    rx refused  . HYPERGLYCEMIA 08/12/2008  . HYPERTENSION 01/03/2007  . Arthritis   . Fibroid   . Arthritis     Past Surgical History  Procedure Laterality Date  . Tubal ligation    . Cholecystectomy    . Joint replacement      bilateral knee replacements  . Replacement total knee bilateral  2010  . Incisional hernia repair  02/21/2012    Procedure: LAPAROSCOPIC INCISIONAL HERNIA;  Surgeon: Shelly Rubenstein, MD;  Location: [facility] OR;  Service: General;  Laterality: N/A;  laparoscopic incisional hernia repair with mesh  . Cesarean section  25 years ago  . Dilation and curettage of uterus  35 years ago    History   Social History  . Marital Status: Married    Spouse Name: N/A    Number of Children: N/A  . Years of Education: N/A   Occupational History  . Beautician    Social History Main Topics  . Smoking status: Never Smoker   . Smokeless tobacco: Not on file  . Alcohol Use: No  . Drug Use: No  . Sexual Activity: Yes    Partners: Male   Other Topics Concern  . Not on file   Social History Narrative  . No narrative on file    Current Outpatient Prescriptions on File Prior to Visit  Medication Sig Dispense Refill  . aspirin EC 81 MG tablet Take 81 mg by mouth daily.      . fish oil-omega-3 fatty acids 1000 MG capsule Take 1 g by mouth daily.      . Multiple Vitamin (MULTIVITAMIN WITH MINERALS) TABS Take 1 tablet by mouth daily.      . pravastatin (PRAVACHOL) 40 MG tablet Take 1 tablet (40 mg total) by mouth daily.  30 tablet  3   No current  facility-administered medications on file prior to visit.    Allergies  Allergen Reactions  . Lisinopril Cough  . Oxycodone Other (See Comments)    Hallucinations    Family History  Problem Relation Age of Onset  . Cancer Neg Hx   . Diabetes Mother   . Heart disease Mother   . Diabetes Father   . Heart disease Father     Triple Bypass  . Heart attack Brother   . Congestive Heart Failure Brother     BP 128/74  Pulse 77  Ht 5\' 5"  (1.651 m)  Wt 226 lb (102.513 kg)  BMI 37.61 kg/m2  SpO2 97%  LMP 08/30/1998    Review of Systems Denies rhinorrhea and nasal congestion.      Objective:   Physical Exam VITAL SIGNS:  See vs page GENERAL: no distress LUNGS:  Clear to auscultation, except for a few wheezes.   Lab Results  Component Value Date   WBC 7.2 03/09/2013   HGB 14.3 03/09/2013   HCT 42.0 03/09/2013   PLT 198.0 03/09/2013   GLUCOSE 106* 03/09/2013   CHOL 180 03/09/2013   TRIG 93.0 03/09/2013  HDL 52.50 03/09/2013   LDLCALC 109* 03/09/2013   ALT 41* 03/09/2013   AST 50* 03/09/2013   NA 138 03/09/2013   K 3.4* 03/09/2013   CL 100 03/09/2013   CREATININE 0.7 03/09/2013   BUN 7 03/09/2013   CO2 33* 03/09/2013   TSH 0.55 03/09/2013   PSA 0.00* 01/13/2010   HGBA1C 6.4 03/09/2013      Assessment & Plan:  Chronic intermittent cough, uncertain etiology Hyperglycemia, stable. Dyslipidemia: therapy limited by noncompliance.  i'll do the best i can. HTN: well-controlled

## 2013-03-10 ENCOUNTER — Other Ambulatory Visit: Payer: BC Managed Care – PPO

## 2013-03-10 ENCOUNTER — Other Ambulatory Visit: Payer: Self-pay

## 2013-03-10 DIAGNOSIS — K900 Celiac disease: Secondary | ICD-10-CM

## 2013-03-11 ENCOUNTER — Other Ambulatory Visit: Payer: Self-pay | Admitting: Endocrinology

## 2013-03-11 DIAGNOSIS — R059 Cough, unspecified: Secondary | ICD-10-CM

## 2013-03-11 DIAGNOSIS — R05 Cough: Secondary | ICD-10-CM

## 2013-03-11 LAB — HEPATITIS C ANTIBODY: HCV Ab: NEGATIVE

## 2013-03-11 LAB — HEPATITIS B SURFACE ANTIGEN: Hepatitis B Surface Ag: NEGATIVE

## 2013-03-11 LAB — HEPATITIS B SURFACE ANTIBODY,QUALITATIVE: Hep B S Ab: POSITIVE — AB

## 2013-03-11 MED ORDER — PRAVASTATIN SODIUM 40 MG PO TABS: 40.0000 mg | ORAL_TABLET | Freq: Every day | ORAL | Status: DC

## 2013-03-11 MED ORDER — PROMETHAZINE-DM 6.25-15 MG/5ML PO SYRP: 5.0000 mL | ORAL_SOLUTION | Freq: Four times a day (QID) | ORAL | Status: DC | PRN

## 2013-03-12 ENCOUNTER — Ambulatory Visit (HOSPITAL_COMMUNITY)
Admission: RE | Admit: 2013-03-12 | Discharge: 2013-03-12 | Disposition: A | Discharge status: Home / Self Care | Payer: BC Managed Care – PPO | Source: Ambulatory Visit | Attending: Endocrinology | Admitting: Endocrinology

## 2013-03-12 ENCOUNTER — Other Ambulatory Visit: Payer: Self-pay | Admitting: Endocrinology

## 2013-03-12 DIAGNOSIS — R059 Cough, unspecified: Secondary | ICD-10-CM | POA: Insufficient documentation

## 2013-03-12 DIAGNOSIS — R05 Cough: Secondary | ICD-10-CM | POA: Insufficient documentation

## 2013-03-12 DIAGNOSIS — R509 Fever, unspecified: Secondary | ICD-10-CM | POA: Insufficient documentation

## 2013-03-12 DIAGNOSIS — R918 Other nonspecific abnormal finding of lung field: Secondary | ICD-10-CM | POA: Insufficient documentation

## 2013-03-12 LAB — URINE CULTURE: Colony Count: >=100000

## 2013-03-12 MED ORDER — AMOXICILLIN-POT CLAVULANATE 875-125 MG PO TABS: 1.0000 | ORAL_TABLET | Freq: Two times a day (BID) | ORAL | Status: AC

## 2013-03-13 ENCOUNTER — Telehealth: Payer: Self-pay | Admitting: Endocrinology

## 2013-03-13 NOTE — Telephone Encounter (Signed)
Pt wanted to know if she should stay in the house or if it was ok to go out, pt was she should limit her physical activity get rest and stay hydrated

## 2013-03-19 ENCOUNTER — Other Ambulatory Visit: Payer: Self-pay | Admitting: Endocrinology

## 2013-03-20 ENCOUNTER — Telehealth: Payer: Self-pay

## 2013-03-20 MED ORDER — PROMETHAZINE-DM 6.25-15 MG/5ML PO SYRP: 5.0000 mL | ORAL_SOLUTION | Freq: Four times a day (QID) | ORAL | Status: DC | PRN

## 2013-03-20 NOTE — Telephone Encounter (Signed)
i sent refill yesterday

## 2013-03-20 NOTE — Telephone Encounter (Signed)
Pt left voicemail stating she needs a refill on cough syrup

## 2013-03-20 NOTE — Telephone Encounter (Signed)
Pt states she would like to get rx refilled for tessalon also, she stated she has enough cough syrup

## 2013-03-20 NOTE — Telephone Encounter (Signed)
i have sent a prescription to your pharmacy 

## 2013-03-27 ENCOUNTER — Ambulatory Visit (INDEPENDENT_AMBULATORY_CARE_PROVIDER_SITE_OTHER): Payer: BC Managed Care – PPO | Admitting: Endocrinology

## 2013-03-27 ENCOUNTER — Telehealth: Payer: Self-pay

## 2013-03-27 ENCOUNTER — Ambulatory Visit
Admission: RE | Admit: 2013-03-27 | Discharge: 2013-03-27 | Disposition: A | Discharge status: Home / Self Care | Payer: BC Managed Care – PPO | Source: Ambulatory Visit | Attending: Endocrinology | Admitting: Endocrinology

## 2013-03-27 ENCOUNTER — Encounter: Payer: Self-pay | Admitting: Endocrinology

## 2013-03-27 VITALS — BP 136/80 | HR 83 | Wt 228.0 lb

## 2013-03-27 DIAGNOSIS — K900 Celiac disease: Secondary | ICD-10-CM

## 2013-03-27 DIAGNOSIS — Z0000 Encounter for general adult medical examination without abnormal findings: Secondary | ICD-10-CM

## 2013-03-27 DIAGNOSIS — R918 Other nonspecific abnormal finding of lung field: Secondary | ICD-10-CM | POA: Insufficient documentation

## 2013-03-27 DIAGNOSIS — I10 Essential (primary) hypertension: Secondary | ICD-10-CM

## 2013-03-27 MED ORDER — TRAZODONE HCL 100 MG PO TABS: 100.0000 mg | ORAL_TABLET | Freq: Every day | ORAL | Status: DC

## 2013-03-27 NOTE — Telephone Encounter (Signed)
Pt advised.

## 2013-03-27 NOTE — Patient Instructions (Addendum)
please consider these measures for your health:  minimize alcohol.  do not use tobacco products.  have a colonoscopy at least every 10 years from age 60.  Women should have an annual mammogram from age 65.  keep firearms safely stored.  always use seat belts.  have working smoke alarms in your home.  see an eye doctor and dentist regularly.  never drive under the influence of alcohol or drugs (including prescription drugs).   Another chest-x-ray is requested for you today.  We'll contact you with results. Please return in 1 year.

## 2013-03-27 NOTE — Progress Notes (Signed)
Subjective:    Patient ID: Angela Estrada, female    DOB: 04-04-1951, 62 y.o.   MRN: [id number]  HPI Pt is here for regular wellness examination, and is feeling pretty well in general, and says chronic med probs are stable, except as noted below Past Medical History  Diagnosis Date  . ALLERGIC RHINITIS 01/03/2007  . COUGH DUE TO ACE INHIBITORS 08/12/2008    Due to Accupril  . Dizziness and giddiness 04/25/2007  . HYPERCHOLESTEROLEMIA 04/25/2007    rx refused  . HYPERGLYCEMIA 08/12/2008  . HYPERTENSION 01/03/2007  . Arthritis   . Fibroid   . Arthritis     Past Surgical History  Procedure Laterality Date  . Tubal ligation    . Cholecystectomy    . Joint replacement      bilateral knee replacements  . Replacement total knee bilateral  2010  . Incisional hernia repair  02/21/2012    Procedure: LAPAROSCOPIC INCISIONAL HERNIA;  Surgeon: Shelly Rubenstein, MD;  Location: [facility];  Service: General;  Laterality: N/A;  laparoscopic incisional hernia repair with mesh  . Cesarean section  25 years ago  . Dilation and curettage of uterus  35 years ago    History   Social History  . Marital Status: Married    Spouse Name: N/A    Number of Children: N/A  . Years of Education: N/A   Occupational History  . Beautician    Social History Main Topics  . Smoking status: Never Smoker   . Smokeless tobacco: Not on file  . Alcohol Use: No  . Drug Use: No  . Sexual Activity: Yes    Partners: Male   Other Topics Concern  . Not on file   Social History Narrative  . No narrative on file    Current Outpatient Prescriptions on File Prior to Visit  Medication Sig Dispense Refill  . amLODipine (NORVASC) 2.5 MG tablet Take 1 tablet (2.5 mg total) by mouth daily.  30 tablet  0  . aspirin EC 81 MG tablet Take 81 mg by mouth daily.      . fish oil-omega-3 fatty acids 1000 MG capsule Take 1 g by mouth daily.      Marland Kitchen losartan-hydrochlorothiazide (HYZAAR) 100-25 MG per tablet Take 1 tablet by  mouth daily.  30 tablet  0  . mometasone (NASONEX) 50 MCG/ACT nasal spray Place 2 sprays into the nose daily as needed. As needed for nasal allergies  17 g  11  . Multiple Vitamin (MULTIVITAMIN WITH MINERALS) TABS Take 1 tablet by mouth daily.      . pravastatin (PRAVACHOL) 40 MG tablet Take 1 tablet (40 mg total) by mouth daily.  30 tablet  11   No current facility-administered medications on file prior to visit.    Allergies  Allergen Reactions  . Lisinopril Cough  . Oxycodone Other (See Comments)    Hallucinations    Family History  Problem Relation Age of Onset  . Cancer Neg Hx   . Diabetes Mother   . Heart disease Mother   . Diabetes Father   . Heart disease Father     Triple Bypass  . Heart attack Brother   . Congestive Heart Failure Brother     BP 136/80  Pulse 83  Wt 228 lb (103.42 kg)  BMI 37.94 kg/m2  SpO2 95%  LMP 08/30/1998     Review of Systems  Constitutional: Negative for fever and unexpected weight change.  HENT: Negative for hearing loss.  Eyes: Negative for visual disturbance.  Respiratory: Negative for shortness of breath.   Cardiovascular: Negative for chest pain.  Gastrointestinal: Negative for anal bleeding.  Endocrine: Negative for cold intolerance.  Genitourinary: Negative for hematuria.  Musculoskeletal: Negative for back pain.  Skin: Negative for rash.  Allergic/Immunologic: Positive for environmental allergies.  Neurological: Negative for syncope and numbness.  Hematological: Bruises/bleeds easily.  Psychiatric/Behavioral: Negative for dysphoric mood.       Objective:   Physical Exam VS: see vs page GEN: no distress HEAD: head: no deformity eyes: no periorbital swelling, no proptosis external nose and ears are normal mouth: no lesion seen NECK: supple, thyroid is not enlarged CHEST WALL: no deformity LUNGS:  Clear to auscultation BREASTS:  No mass.  No d/c CV: reg rate and rhythm, no murmur ABD: abdomen is soft,  nontender.  no hepatosplenomegaly.  not distended.  no hernia GENITALIA:  Normal external female.  Normal bimanual exam RECTAL:  MUSCULOSKELETAL: muscle bulk and strength are grossly normal.  no obvious joint swelling.  gait is normal and steady EXTEMITIES: no deformity.  no ulcer on the feet.  feet are of normal color and temp.  no edema PULSES: dorsalis pedis intact bilat.  no carotid bruit NEURO:  cn 2-12 grossly intact.   readily moves all 4's.  sensation is intact to touch on the feet SKIN:  Normal texture and temperature.  No rash or suspicious lesion is visible.   NODES:  None palpable at the neck PSYCH: alert, oriented x3.  Does not appear anxious nor depressed.     Assessment & Plan:  Wellness visit today, with problems stable, except as noted. we discussed code status.  pt requests full code, but would not want to be started or maintained on artificial life-support measures if there was not a reasonable chance of recovery

## 2013-03-27 NOTE — Telephone Encounter (Signed)
Pt states she needs to know when to come back for appt. and should she reapeat cxr to see if is completely clear

## 2013-03-27 NOTE — Telephone Encounter (Signed)
Please call in 1 month, and i would be happy to then order another cxr to be done at [email or web address]

## 2013-03-28 DIAGNOSIS — Z0000 Encounter for general adult medical examination without abnormal findings: Secondary | ICD-10-CM | POA: Insufficient documentation

## 2013-03-31 ENCOUNTER — Other Ambulatory Visit: Payer: Self-pay | Admitting: *Deleted

## 2013-03-31 MED ORDER — AMLODIPINE BESYLATE 2.5 MG PO TABS: 2.5000 mg | ORAL_TABLET | Freq: Every day | ORAL | Status: DC

## 2013-04-01 ENCOUNTER — Other Ambulatory Visit: Payer: Self-pay

## 2013-04-01 MED ORDER — LOSARTAN POTASSIUM-HCTZ 100-25 MG PO TABS: 1.0000 | ORAL_TABLET | Freq: Every day | ORAL | Status: DC

## 2013-04-07 ENCOUNTER — Ambulatory Visit (INDEPENDENT_AMBULATORY_CARE_PROVIDER_SITE_OTHER): Payer: BC Managed Care – PPO | Admitting: Endocrinology

## 2013-04-07 ENCOUNTER — Encounter: Payer: Self-pay | Admitting: Endocrinology

## 2013-04-07 ENCOUNTER — Ambulatory Visit
Admission: RE | Admit: 2013-04-07 | Discharge: 2013-04-07 | Disposition: A | Discharge status: Home / Self Care | Payer: BC Managed Care – PPO | Source: Ambulatory Visit | Attending: Endocrinology | Admitting: Endocrinology

## 2013-04-07 ENCOUNTER — Other Ambulatory Visit: Payer: Self-pay

## 2013-04-07 VITALS — BP 138/88 | Temp 98.1°F | Wt 226.5 lb

## 2013-04-07 DIAGNOSIS — R918 Other nonspecific abnormal finding of lung field: Secondary | ICD-10-CM

## 2013-04-07 MED ORDER — BENZONATATE 200 MG PO CAPS: 200.0000 mg | ORAL_CAPSULE | Freq: Two times a day (BID) | ORAL | Status: DC | PRN

## 2013-04-07 MED ORDER — AZITHROMYCIN 500 MG PO TABS: 500.0000 mg | ORAL_TABLET | Freq: Every day | ORAL | Status: DC

## 2013-04-07 NOTE — Progress Notes (Signed)
Subjective:    Patient ID: Angela Estrada, female    DOB: November 22, 1950, 62 y.o.   MRN: [id number]  HPI Pt states few weeks of slight dry cough in the chest, and assoc nasal congestion.  She has slight bilat otalgia.  She says her chronic rhinorrhea has recurred. Past Medical History  Diagnosis Date  . ALLERGIC RHINITIS 01/03/2007  . COUGH DUE TO ACE INHIBITORS 08/12/2008    Due to Accupril  . Dizziness and giddiness 04/25/2007  . HYPERCHOLESTEROLEMIA 04/25/2007    rx refused  . HYPERGLYCEMIA 08/12/2008  . HYPERTENSION 01/03/2007  . Arthritis   . Fibroid   . Arthritis     Past Surgical History  Procedure Laterality Date  . Tubal ligation    . Cholecystectomy    . Joint replacement      bilateral knee replacements  . Replacement total knee bilateral  2010  . Incisional hernia repair  02/21/2012    Procedure: LAPAROSCOPIC INCISIONAL HERNIA;  Surgeon: Shelly Rubenstein, MD;  Location: [facility];  Service: General;  Laterality: N/A;  laparoscopic incisional hernia repair with mesh  . Cesarean section  25 years ago  . Dilation and curettage of uterus  35 years ago    History   Social History  . Marital Status: Married    Spouse Name: N/A    Number of Children: N/A  . Years of Education: N/A   Occupational History  . Beautician    Social History Main Topics  . Smoking status: Never Smoker   . Smokeless tobacco: Not on file  . Alcohol Use: No  . Drug Use: No  . Sexual Activity: Yes    Partners: Male   Other Topics Concern  . Not on file   Social History Narrative  . No narrative on file    Current Outpatient Prescriptions on File Prior to Visit  Medication Sig Dispense Refill  . amLODipine (NORVASC) 2.5 MG tablet Take 1 tablet (2.5 mg total) by mouth daily.  30 tablet  5  . aspirin EC 81 MG tablet Take 81 mg by mouth daily.      . fish oil-omega-3 fatty acids 1000 MG capsule Take 1 g by mouth daily.      Marland Kitchen losartan-hydrochlorothiazide (HYZAAR) 100-25 MG per tablet  Take 1 tablet by mouth daily.  30 tablet  0  . mometasone (NASONEX) 50 MCG/ACT nasal spray Place 2 sprays into the nose daily as needed. As needed for nasal allergies  17 g  11  . Multiple Vitamin (MULTIVITAMIN WITH MINERALS) TABS Take 1 tablet by mouth daily.      . pravastatin (PRAVACHOL) 40 MG tablet Take 1 tablet (40 mg total) by mouth daily.  30 tablet  11  . traZODone (DESYREL) 100 MG tablet Take 1 tablet (100 mg total) by mouth at bedtime.  30 tablet  11   No current facility-administered medications on file prior to visit.    Allergies  Allergen Reactions  . Lisinopril Cough  . Oxycodone Other (See Comments)    Hallucinations    Family History  Problem Relation Age of Onset  . Cancer Neg Hx   . Diabetes Mother   . Heart disease Mother   . Diabetes Father   . Heart disease Father     Triple Bypass  . Heart attack Brother   . Congestive Heart Failure Brother     BP 138/88  Temp(Src) 98.1 F (36.7 C) (Oral)  Wt 226 lb 8 oz (102.74 kg)  BMI 37.69 kg/m2  LMP 08/30/1998  Review of Systems Denies fever and sob.      Objective:   Physical Exam VITAL SIGNS:  See vs page GENERAL: no distress head: no deformity eyes: no periorbital swelling, no proptosis.   external nose and ears are normal mouth: no lesion seen Both tm's are very red LUNGS:  Clear to auscultation.     CXR: NAD    Assessment & Plan:  bilat AOM, new Cough persistent.  Improved, but may be exac by this new illness. Allergic rhinitis, persistent.  sxs may also be exac by her current illness.  Abnormal CXR: much better

## 2013-04-07 NOTE — Patient Instructions (Addendum)
i have sent a prescription to your pharmacy, for an antibiotic pill, and cough medication.  Let's recheck the chest-x-ray today, downstairs. Loratadine-d (non-prescription) will help your congestion. Also, please resume the nasal spray. I hope you feel better soon.  If you don't feel better by next week, please call back.  Please call sooner if you get worse.

## 2013-04-07 NOTE — Progress Notes (Signed)
Quick Note:  Called and spoke with pt and pt is aware. ______ 

## 2013-06-09 ENCOUNTER — Other Ambulatory Visit: Payer: Self-pay | Admitting: Endocrinology

## 2013-07-14 ENCOUNTER — Other Ambulatory Visit: Payer: Self-pay | Admitting: Endocrinology

## 2013-08-11 ENCOUNTER — Other Ambulatory Visit: Payer: Self-pay | Admitting: Endocrinology

## 2013-08-31 ENCOUNTER — Ambulatory Visit: Payer: BC Managed Care – PPO | Admitting: Obstetrics and Gynecology

## 2013-09-14 ENCOUNTER — Other Ambulatory Visit: Payer: Self-pay | Admitting: Endocrinology

## 2013-10-02 ENCOUNTER — Ambulatory Visit (INDEPENDENT_AMBULATORY_CARE_PROVIDER_SITE_OTHER): Payer: BC Managed Care – PPO | Admitting: Obstetrics and Gynecology

## 2013-10-02 ENCOUNTER — Encounter: Payer: Self-pay | Admitting: Obstetrics and Gynecology

## 2013-10-02 VITALS — BP 130/84 | HR 90 | Ht 63.0 in | Wt 228.6 lb

## 2013-10-02 DIAGNOSIS — Z23 Encounter for immunization: Secondary | ICD-10-CM

## 2013-10-02 DIAGNOSIS — Z01419 Encounter for gynecological examination (general) (routine) without abnormal findings: Secondary | ICD-10-CM

## 2013-10-02 NOTE — Progress Notes (Signed)
Patient ID: Angela Estrada, female   DOB: 01/23/1951, 63 y.o.   MRN: [id number] GYNECOLOGY VISIT  PCP:  Renato Shin, MD  Referring provider:   HPI: 63 y.o.   Married  [place] American  female   G1P1001 with Patient's last menstrual period was 08/30/1998.   here for  AEX.  Pneumonia last fall.   Hgb:   PCP Urine:  Unable to void  GYNECOLOGIC HISTORY: Patient's last menstrual period was 08/30/1998. Sexually active:  yes Partner preference: female Contraception: Tubal ligation Menopausal hormone therapy: no DES exposure: no   Blood transfusions:   no Sexually transmitted diseases:  no  GYN procedures and prior surgeries:  Tubal ligation Last mammogram:  10-06-12 wnl:[facility]               Last pap and high risk HPV testing:   08-29-12 wnl:neg HR HPV History of abnormal pap smear:  Hx of abnormal pap 20-25 years ago.  No colposcopy and patient states had repeat pap and returned as normal.   OB History   Grav Para Term Preterm Abortions TAB SAB Ect Mult Living   1 1 1       1        LIFESTYLE: Exercise:   Treadmill, total gym, knee presses           Tobacco:    no Alcohol:       no Drug use:    no  OTHER HEALTH MAINTENANCE: Tetanus/TDap:   2003 Gardisil:              n/a Influenza:            03/2013 Zostavax:             n/a  Bone density:     05-09-10 wnl:[facility] Colonoscopy:     2011 wnl with [facility].  Next colonoscopy due 2021.  Cholesterol check:  wnl with medication  Family History  Problem Relation Age of Onset  . Cancer Neg Hx   . Diabetes Mother   . Heart disease Mother   . Hypertension Mother   . Stroke Mother   . Diabetes Father   . Heart disease Father     Triple Bypass  . Hypertension Father   . Heart attack Brother   . Congestive Heart Failure Brother   . Hypertension Brother   . Ovarian cancer Sister 47  . Hypertension Sister     Patient Active Problem List   Diagnosis Date Noted  . Routine general medical  examination at a health care facility 03/28/2013  . Nonspecific abnormal findings on radiological and examination of lung field 03/27/2013  . Cough 03/11/2013  . Unspecified disorder of liver 03/09/2013  . Pyuria 03/09/2013  . Arthritis   . Ventral hernia 01/18/2012  . Encounter for long-term (current) use of other medications 07/05/2011  . HYPERGLYCEMIA 08/12/2008  . HYPERCHOLESTEROLEMIA 04/25/2007  . DIZZINESS AND GIDDINESS 04/25/2007  . HYPERTENSION 01/03/2007  . ALLERGIC RHINITIS 01/03/2007   Past Medical History  Diagnosis Date  . ALLERGIC RHINITIS 01/03/2007  . COUGH DUE TO ACE INHIBITORS 08/12/2008    Due to Accupril  . Dizziness and giddiness 04/25/2007  . HYPERCHOLESTEROLEMIA 04/25/2007    rx refused  . HYPERGLYCEMIA 08/12/2008  . HYPERTENSION 01/03/2007  . Arthritis   . Fibroid   . Arthritis     Past Surgical History  Procedure Laterality Date  . Tubal ligation    . Cholecystectomy    .  Joint replacement      bilateral knee replacements  . Replacement total knee bilateral  2010  . Incisional hernia repair  02/21/2012    Procedure: LAPAROSCOPIC INCISIONAL HERNIA;  Surgeon: Harl Bowie, MD;  Location: [place];  Service: General;  Laterality: N/A;  laparoscopic incisional hernia repair with mesh  . Cesarean section  25 years ago  . Dilation and curettage of uterus  35 years ago    ALLERGIES: Lisinopril and Oxycodone  Current Outpatient Prescriptions  Medication Sig Dispense Refill  . amLODipine (NORVASC) 2.5 MG tablet Take 1 tablet (2.5 mg total) by mouth daily.  30 tablet  5  . aspirin EC 81 MG tablet Take 81 mg by mouth daily.      . fish oil-omega-3 fatty acids 1000 MG capsule Take 1 g by mouth daily.      Marland Kitchen losartan-hydrochlorothiazide (HYZAAR) 100-25 MG per tablet TAKE 1 TABLET BY MOUTH EVERY DAY  30 tablet  0  . mometasone (NASONEX) 50 MCG/ACT nasal spray Place 2 sprays into the nose daily as needed. As needed for nasal allergies  17 g  11  . Multiple  Vitamin (MULTIVITAMIN WITH MINERALS) TABS Take 1 tablet by mouth daily.      . pravastatin (PRAVACHOL) 40 MG tablet Take 1 tablet (40 mg total) by mouth daily.  30 tablet  11   No current facility-administered medications for this visit.     ROS:  Pertinent items are noted in HPI.  SOCIAL HISTORY:  Hairdresser.   PHYSICAL EXAMINATION:    BP 130/84  Pulse 90  Ht 5\' 3"  (1.6 m)  Wt 228 lb 9.6 oz (103.692 kg)  BMI 40.50 kg/m2  LMP 08/30/1998   Wt Readings from Last 3 Encounters:  10/02/13 228 lb 9.6 oz (103.692 kg)  04/07/13 226 lb 8 oz (102.74 kg)  03/27/13 228 lb (103.42 kg)     Ht Readings from Last 3 Encounters:  10/02/13 5\' 3"  (1.6 m)  03/09/13 5\' 5"  (1.651 m)  08/29/12 5\' 4"  (1.626 m)    General appearance: alert, cooperative and appears stated age Head: Normocephalic, without obvious abnormality, atraumatic Neck: no adenopathy, supple, symmetrical, trachea midline and thyroid not enlarged, symmetric, no tenderness/mass/nodules Lungs: clear to auscultation bilaterally Breasts: Inspection negative, No nipple retraction or dimpling, No nipple discharge or bleeding, No axillary or supraclavicular adenopathy, Normal to palpation without dominant masses Heart: regular rate and rhythm Abdomen: multiple scattered laparoscopic incisions, obese, soft, non-tender; no masses,  no organomegaly Extremities: extremities normal, atraumatic, no cyanosis or edema Skin: Skin color, texture, turgor normal. No rashes or lesions Lymph nodes: Cervical, supraclavicular, and axillary nodes normal. No abnormal inguinal nodes palpated Neurologic: Grossly normal  Pelvic: External genitalia:  no lesions              Urethra:  normal appearing urethra with no masses, tenderness or lesions              Bartholins and Skenes: normal                 Vagina: normal appearing vagina with normal color and discharge, no lesions              Cervix: normal appearance              Pap and high risk HPV  testing done: no.            Bimanual Exam:  Uterus:  uterus is normal size, shape, consistency and nontender  Adnexa: normal adnexa in size, nontender and no masses                                      Rectovaginal: Confirms                                      Anus:  normal sphincter tone, no lesions  ASSESSMENT  Normal gynecologic exam.  PLAN  Mammogram recommended yearly.  Patient will [facility].  Pap smear and high risk HPV testing not indicated.  Counseled on self breast exam, Calcium and vitamin D intake, exercise. TDap today.  Return annually or prn   An After Visit Summary was printed and given to the patient.

## 2013-10-02 NOTE — Patient Instructions (Signed)

## 2013-10-03 ENCOUNTER — Other Ambulatory Visit: Payer: Self-pay | Admitting: Endocrinology

## 2013-10-06 ENCOUNTER — Other Ambulatory Visit: Payer: Self-pay | Admitting: Obstetrics and Gynecology

## 2013-10-06 DIAGNOSIS — Z1231 Encounter for screening mammogram for malignant neoplasm of breast: Secondary | ICD-10-CM

## 2013-10-19 ENCOUNTER — Other Ambulatory Visit: Payer: Self-pay | Admitting: Endocrinology

## 2013-10-23 ENCOUNTER — Telehealth: Payer: Self-pay | Admitting: *Deleted

## 2013-10-23 ENCOUNTER — Ambulatory Visit (INDEPENDENT_AMBULATORY_CARE_PROVIDER_SITE_OTHER): Payer: BC Managed Care – PPO | Admitting: Endocrinology

## 2013-10-23 ENCOUNTER — Encounter: Payer: Self-pay | Admitting: Endocrinology

## 2013-10-23 VITALS — BP 130/82 | HR 111 | Temp 98.6°F | Ht 65.0 in | Wt 227.0 lb

## 2013-10-23 DIAGNOSIS — J069 Acute upper respiratory infection, unspecified: Secondary | ICD-10-CM

## 2013-10-23 MED ORDER — PROMETHAZINE-DM 6.25-15 MG/5ML PO SYRP: 5.0000 mL | ORAL_SOLUTION | ORAL | Status: DC | PRN

## 2013-10-23 MED ORDER — CEFUROXIME AXETIL 250 MG PO TABS: 250.0000 mg | ORAL_TABLET | Freq: Two times a day (BID) | ORAL | Status: AC

## 2013-10-23 NOTE — Progress Notes (Signed)
Subjective:    Patient ID: Angela Estrada, female    DOB: 01/18/1951, 63 y.o.   MRN: [id number]  HPI Pt states 4 days of moderate prod-quality cough in the chest, and assoc nasal congestion.   Past Medical History  Diagnosis Date  . ALLERGIC RHINITIS 01/03/2007  . COUGH DUE TO ACE INHIBITORS 08/12/2008    Due to Accupril  . Dizziness and giddiness 04/25/2007  . HYPERCHOLESTEROLEMIA 04/25/2007    rx refused  . HYPERGLYCEMIA 08/12/2008  . HYPERTENSION 01/03/2007  . Arthritis   . Fibroid   . Arthritis     Past Surgical History  Procedure Laterality Date  . Tubal ligation    . Cholecystectomy    . Joint replacement      bilateral knee replacements  . Replacement total knee bilateral  2010  . Incisional hernia repair  02/21/2012    Procedure: LAPAROSCOPIC INCISIONAL HERNIA;  Surgeon: Harl Bowie, MD;  Location: [place];  Service: General;  Laterality: N/A;  laparoscopic incisional hernia repair with mesh  . Cesarean section  25 years ago  . Dilation and curettage of uterus  35 years ago    History   Social History  . Marital Status: Married    Spouse Name: N/A    Number of Children: N/A  . Years of Education: N/A   Occupational History  . Beautician    Social History Main Topics  . Smoking status: Never Smoker   . Smokeless tobacco: Not on file  . Alcohol Use: No  . Drug Use: No  . Sexual Activity: Yes    Partners: Male    Birth Control/ Protection: Post-menopausal   Other Topics Concern  . Not on file   Social History Narrative  . No narrative on file    Current Outpatient Prescriptions on File Prior to Visit  Medication Sig Dispense Refill  . amLODipine (NORVASC) 2.5 MG tablet TAKE 1 TABLET (2.5 MG TOTAL) BY MOUTH DAILY.  90 tablet  0  . aspirin EC 81 MG tablet Take 81 mg by mouth daily.      . fish oil-omega-3 fatty acids 1000 MG capsule Take 1 g by mouth daily.      Marland Kitchen losartan-hydrochlorothiazide (HYZAAR) 100-25 MG per tablet TAKE 1 TABLET BY  MOUTH EVERY DAY  30 tablet  0  . mometasone (NASONEX) 50 MCG/ACT nasal spray Place 2 sprays into the nose daily as needed. As needed for nasal allergies  17 g  11  . Multiple Vitamin (MULTIVITAMIN WITH MINERALS) TABS Take 1 tablet by mouth daily.      . pravastatin (PRAVACHOL) 40 MG tablet Take 1 tablet (40 mg total) by mouth daily.  30 tablet  11   No current facility-administered medications on file prior to visit.    Allergies  Allergen Reactions  . Lisinopril Cough  . Oxycodone Other (See Comments)    Hallucinations    Family History  Problem Relation Age of Onset  . Cancer Neg Hx   . Diabetes Mother   . Heart disease Mother   . Hypertension Mother   . Stroke Mother   . Diabetes Father   . Heart disease Father     Triple Bypass  . Hypertension Father   . Heart attack Brother   . Congestive Heart Failure Brother   . Hypertension Brother   . Ovarian cancer Sister 80  . Hypertension Sister     BP 130/82  Pulse 111  Temp(Src) 98.6 F (37 C) (  Oral)  Ht 5\' 5"  (1.651 m)  Wt 227 lb (102.967 kg)  BMI 37.77 kg/m2  SpO2 96%  LMP 08/30/1998  Review of Systems Denies fever and earache.      Objective:   Physical Exam VITAL SIGNS:  See vs page GENERAL: no distress head: no deformity eyes: no periorbital swelling, no proptosis external nose and ears are normal mouth: no lesion seen Both tm's are red. LUNGS:  Clear to auscultation.        Assessment & Plan:  URI: new

## 2013-10-23 NOTE — Telephone Encounter (Signed)
Ov now or tomorrow (which would be at [place])

## 2013-10-23 NOTE — Telephone Encounter (Signed)
Pt coming at 4 pm today.

## 2013-10-23 NOTE — Telephone Encounter (Signed)
claritin-D is a good option

## 2013-10-23 NOTE — Telephone Encounter (Signed)
Returned pt's call and advised her to try Claritin-D. Pt stated she has been on this all week. Pt states she is really congested in her chest and is coughing. Please advise.

## 2013-10-23 NOTE — Telephone Encounter (Signed)
Per note from Rogene Houston:   Pt having some sinus problems, stopped up, and just not feeling well what do you recommend for her to take. Please advise.

## 2013-10-23 NOTE — Patient Instructions (Addendum)
i have sent 2 prescription to your pharmacy, for an antibiotic pill, and for cough syrup.   I hope you feel better soon.  If you don't feel better by next week, please call back.  Please call sooner if you get worse.

## 2013-10-27 ENCOUNTER — Ambulatory Visit
Admission: RE | Admit: 2013-10-27 | Discharge: 2013-10-27 | Disposition: A | Discharge status: Home / Self Care | Payer: BC Managed Care – PPO | Source: Ambulatory Visit | Attending: Obstetrics and Gynecology | Admitting: Obstetrics and Gynecology

## 2013-10-27 DIAGNOSIS — Z1231 Encounter for screening mammogram for malignant neoplasm of breast: Secondary | ICD-10-CM

## 2013-11-19 ENCOUNTER — Other Ambulatory Visit: Payer: Self-pay | Admitting: Endocrinology

## 2013-12-16 ENCOUNTER — Other Ambulatory Visit: Payer: Self-pay | Admitting: Endocrinology

## 2014-01-19 ENCOUNTER — Other Ambulatory Visit: Payer: Self-pay | Admitting: Endocrinology

## 2014-01-25 ENCOUNTER — Other Ambulatory Visit: Payer: Self-pay | Admitting: Endocrinology

## 2014-02-11 ENCOUNTER — Encounter: Payer: Self-pay | Admitting: Endocrinology

## 2014-02-11 ENCOUNTER — Ambulatory Visit (INDEPENDENT_AMBULATORY_CARE_PROVIDER_SITE_OTHER): Payer: BC Managed Care – PPO | Admitting: Endocrinology

## 2014-02-11 VITALS — BP 126/66 | HR 72 | Temp 97.8°F | Ht 63.0 in | Wt 227.0 lb

## 2014-02-11 DIAGNOSIS — E7800 Pure hypercholesterolemia, unspecified: Secondary | ICD-10-CM

## 2014-02-11 DIAGNOSIS — K769 Liver disease, unspecified: Secondary | ICD-10-CM

## 2014-02-11 DIAGNOSIS — Z79899 Other long term (current) drug therapy: Secondary | ICD-10-CM

## 2014-02-11 DIAGNOSIS — I10 Essential (primary) hypertension: Secondary | ICD-10-CM

## 2014-02-11 DIAGNOSIS — M2550 Pain in unspecified joint: Secondary | ICD-10-CM

## 2014-02-11 DIAGNOSIS — R7309 Other abnormal glucose: Secondary | ICD-10-CM

## 2014-02-11 DIAGNOSIS — Z23 Encounter for immunization: Secondary | ICD-10-CM

## 2014-02-11 LAB — HEPATIC FUNCTION PANEL
ALT: 23 U/L (ref 0–35)
AST: 27 U/L (ref 0–37)
Albumin: 3.9 g/dL (ref 3.5–5.2)
Alkaline Phosphatase: 57 U/L (ref 39–117)
Bilirubin, Direct: 0.1 mg/dL (ref 0.0–0.3)
Total Bilirubin: 1 mg/dL (ref 0.2–1.2)
Total Protein: 7.7 g/dL (ref 6.0–8.3)

## 2014-02-11 LAB — BASIC METABOLIC PANEL
BUN: 11 mg/dL (ref 6–23)
CO2: 32 mEq/L (ref 19–32)
Calcium: 9.3 mg/dL (ref 8.4–10.5)
Chloride: 100 mEq/L (ref 96–112)
Creatinine, Ser: 0.8 mg/dL (ref 0.4–1.2)
GFR: 94.98 mL/min (ref >60.00)
Glucose, Bld: 108 mg/dL — ABNORMAL HIGH (ref 70–99)
Potassium: 3.3 mEq/L — ABNORMAL LOW (ref 3.5–5.1)
Sodium: 138 mEq/L (ref 135–145)

## 2014-02-11 LAB — CBC WITH DIFFERENTIAL/PLATELET
Basophils Absolute: 0 10*3/uL (ref 0.0–0.1)
Basophils Relative: 0.3 % (ref 0.0–3.0)
Eosinophils Absolute: 0.1 10*3/uL (ref 0.0–0.7)
Eosinophils Relative: 2 % (ref 0.0–5.0)
HCT: 40.8 % (ref 36.0–46.0)
Hemoglobin: 13.8 g/dL (ref 12.0–15.0)
Lymphocytes Relative: 25.5 % (ref 12.0–46.0)
Lymphs Abs: 1.6 10*3/uL (ref 0.7–4.0)
MCHC: 33.8 g/dL (ref 30.0–36.0)
MCV: 90 fl (ref 78.0–100.0)
Monocytes Absolute: 0.4 10*3/uL (ref 0.1–1.0)
Monocytes Relative: 6.7 % (ref 3.0–12.0)
Neutro Abs: 4.1 10*3/uL (ref 1.4–7.7)
Neutrophils Relative %: 65.5 % (ref 43.0–77.0)
Platelets: 247 10*3/uL (ref 150.0–400.0)
RBC: 4.54 Mil/uL (ref 3.87–5.11)
RDW: 13.4 % (ref 11.5–15.5)
WBC: 6.3 10*3/uL (ref 4.0–10.5)

## 2014-02-11 LAB — TSH: TSH: 1.02 u[IU]/mL (ref 0.35–4.50)

## 2014-02-11 LAB — MICROALBUMIN / CREATININE URINE RATIO
Creatinine,U: 111.6 mg/dL
Microalb Creat Ratio: 0.4 mg/g (ref 0.0–30.0)
Microalb, Ur: 0.4 mg/dL (ref 0.0–1.9)

## 2014-02-11 LAB — LIPID PANEL
Cholesterol: 158 mg/dL (ref 0–200)
HDL: 56 mg/dL (ref >39.00)
LDL Cholesterol: 88 mg/dL (ref 0–99)
NonHDL: 102
Total CHOL/HDL Ratio: 3
Triglycerides: 68 mg/dL (ref 0.0–149.0)
VLDL: 13.6 mg/dL (ref 0.0–40.0)

## 2014-02-11 LAB — SEDIMENTATION RATE: Sed Rate: 23 mm/hr — ABNORMAL HIGH (ref 0–22)

## 2014-02-11 LAB — HEMOGLOBIN A1C: Hgb A1c MFr Bld: 6.1 % (ref 4.6–6.5)

## 2014-02-11 MED ORDER — TRAMADOL HCL 50 MG PO TABS: 50.0000 mg | ORAL_TABLET | Freq: Four times a day (QID) | ORAL | Status: DC | PRN

## 2014-02-11 MED ORDER — POTASSIUM CHLORIDE ER 10 MEQ PO TBCR: 10.0000 meq | EXTENDED_RELEASE_TABLET | Freq: Every day | ORAL | Status: DC

## 2014-02-11 NOTE — Patient Instructions (Addendum)
Please come back for a regular physical appointment in 6 weeks (after 03/27/14).  blood tests are being requested for you today.  We'll contact you with results. Here is a prescription for a pain pill. Call if you want to see a specialist for the pain.

## 2014-02-11 NOTE — Progress Notes (Signed)
Subjective:    Patient ID: Angela Estrada, female    DOB: [birth year], 63 y.o.   MRN: [id number]  HPI Pt states few mos of moderate pain at the right shoulder, and assoc pain at the right hip.  No local injury. Past Medical History  Diagnosis Date  . ALLERGIC RHINITIS 01/03/2007  . COUGH DUE TO ACE INHIBITORS 08/12/2008    Due to Accupril  . Dizziness and giddiness 04/25/2007  . HYPERCHOLESTEROLEMIA 04/25/2007    rx refused  . HYPERGLYCEMIA 08/12/2008  . HYPERTENSION 01/03/2007  . Arthritis   . Fibroid   . Arthritis     Past Surgical History  Procedure Laterality Date  . Tubal ligation    . Cholecystectomy    . Joint replacement      bilateral knee replacements  . Replacement total knee bilateral  2010  . Incisional hernia repair  02/21/2012    Procedure: LAPAROSCOPIC INCISIONAL HERNIA;  Surgeon: Harl Bowie, MD;  Location: [facility];  Service: General;  Laterality: N/A;  laparoscopic incisional hernia repair with mesh  . Cesarean section  25 years ago  . Dilation and curettage of uterus  35 years ago    History   Social History  . Marital Status: Married    Spouse Name: N/A    Number of Children: N/A  . Years of Education: N/A   Occupational History  . Beautician    Social History Main Topics  . Smoking status: Never Smoker   . Smokeless tobacco: Not on file  . Alcohol Use: No  . Drug Use: No  . Sexual Activity: Yes    Partners: Male    Birth Control/ Protection: Post-menopausal   Other Topics Concern  . Not on file   Social History Narrative  . No narrative on file    Current Outpatient Prescriptions on File Prior to Visit  Medication Sig Dispense Refill  . amLODipine (NORVASC) 2.5 MG tablet TAKE 1 TABLET (2.5 MG TOTAL) BY MOUTH DAILY.  90 tablet  0  . aspirin EC 81 MG tablet Take 81 mg by mouth daily.      . fish oil-omega-3 fatty acids 1000 MG capsule Take 1 g by mouth daily.      Marland Kitchen losartan-hydrochlorothiazide (HYZAAR) 100-25 MG per tablet TAKE  1 TABLET BY MOUTH EVERY DAY  30 tablet  0  . mometasone (NASONEX) 50 MCG/ACT nasal spray Place 2 sprays into the nose daily as needed. As needed for nasal allergies  17 g  11  . Multiple Vitamin (MULTIVITAMIN WITH MINERALS) TABS Take 1 tablet by mouth daily.      . pravastatin (PRAVACHOL) 40 MG tablet Take 1 tablet (40 mg total) by mouth daily.  30 tablet  11   No current facility-administered medications on file prior to visit.    Allergies  Allergen Reactions  . Lisinopril Cough  . Oxycodone Other (See Comments)    Hallucinations    Family History  Problem Relation Age of Onset  . Cancer Neg Hx   . Diabetes Mother   . Heart disease Mother   . Hypertension Mother   . Stroke Mother   . Diabetes Father   . Heart disease Father     Triple Bypass  . Hypertension Father   . Heart attack Brother   . Congestive Heart Failure Brother   . Hypertension Brother   . Ovarian cancer Sister 5  . Hypertension Sister     BP 126/66  Pulse 72  Temp(Src) 97.8 F (36.6 C) (Oral)  Ht 5\' 3"  (1.6 m)  Wt 227 lb (102.967 kg)  BMI 40.22 kg/m2  SpO2 97%  LMP 08/30/1998    Review of Systems Denies sob and chest pain.     Objective:   Physical Exam VITAL SIGNS:  See vs page GENERAL: no distress Right shoulder: nontender.  Full rom without pain Right hip: nontender.   Gait: slightly favors RLE   Lab Results  Component Value Date   WBC 6.3 02/11/2014   HGB 13.8 02/11/2014   HCT 40.8 02/11/2014   PLT 247.0 02/11/2014   GLUCOSE 108* 02/11/2014   CHOL 158 02/11/2014   TRIG 68.0 02/11/2014   HDL 56.00 02/11/2014   LDLCALC 88 02/11/2014   ALT 23 02/11/2014   AST 27 02/11/2014   NA 138 02/11/2014   K 3.3* 02/11/2014   CL 100 02/11/2014   CREATININE 0.8 02/11/2014   BUN 11 02/11/2014   CO2 32 02/11/2014   TSH 1.02 02/11/2014   PSA 0.00* 01/13/2010   HGBA1C 6.1 02/11/2014   MICROALBUR 0.4 02/11/2014      Assessment & Plan:  Arthralgias, new, uncertain etiology. Hypokalemia: recurrent DM:  well-controlled    Patient is advised the following: Patient Instructions  Please come back for a regular physical appointment in 6 weeks (after 03/27/14).  blood tests are being requested for you today.  We'll contact you with results. Here is a prescription for a pain pill. Call if you want to see a specialist for the pain.

## 2014-02-22 ENCOUNTER — Other Ambulatory Visit: Payer: Self-pay | Admitting: Endocrinology

## 2014-03-26 ENCOUNTER — Other Ambulatory Visit: Payer: Self-pay | Admitting: Endocrinology

## 2014-03-29 ENCOUNTER — Ambulatory Visit (INDEPENDENT_AMBULATORY_CARE_PROVIDER_SITE_OTHER): Payer: BC Managed Care – PPO | Admitting: Endocrinology

## 2014-03-29 ENCOUNTER — Encounter: Payer: Self-pay | Admitting: Endocrinology

## 2014-03-29 VITALS — BP 132/88 | HR 75 | Temp 97.8°F | Ht 63.0 in | Wt 227.0 lb

## 2014-03-29 DIAGNOSIS — Z23 Encounter for immunization: Secondary | ICD-10-CM

## 2014-03-29 DIAGNOSIS — E876 Hypokalemia: Secondary | ICD-10-CM

## 2014-03-29 LAB — BASIC METABOLIC PANEL
BUN: 10 mg/dL (ref 6–23)
CO2: 30 mEq/L (ref 19–32)
Calcium: 9.6 mg/dL (ref 8.4–10.5)
Chloride: 100 mEq/L (ref 96–112)
Creatinine, Ser: 0.8 mg/dL (ref 0.4–1.2)
GFR: 97.79 mL/min (ref >60.00)
Glucose, Bld: 117 mg/dL — ABNORMAL HIGH (ref 70–99)
Potassium: 3.6 mEq/L (ref 3.5–5.1)
Sodium: 139 mEq/L (ref 135–145)

## 2014-03-29 NOTE — Progress Notes (Signed)
Subjective:    Patient ID: Angela Estrada, female    DOB: 22-Mar-1951, 63 y.o.   MRN: [id number]  HPI Pt is here for regular wellness examination, and is feeling pretty well in general, and says chronic med probs are stable, except as noted below Past Medical History  Diagnosis Date  . ALLERGIC RHINITIS 01/03/2007  . COUGH DUE TO ACE INHIBITORS 08/12/2008    Due to Accupril  . Dizziness and giddiness 04/25/2007  . HYPERCHOLESTEROLEMIA 04/25/2007    rx refused  . HYPERGLYCEMIA 08/12/2008  . HYPERTENSION 01/03/2007  . Arthritis   . Fibroid   . Arthritis     Past Surgical History  Procedure Laterality Date  . Tubal ligation    . Cholecystectomy    . Joint replacement      bilateral knee replacements  . Replacement total knee bilateral  2010  . Incisional hernia repair  02/21/2012    Procedure: LAPAROSCOPIC INCISIONAL HERNIA;  Surgeon: Harl Bowie, MD;  Location: [facility];  Service: General;  Laterality: N/A;  laparoscopic incisional hernia repair with mesh  . Cesarean section  25 years ago  . Dilation and curettage of uterus  35 years ago    History   Social History  . Marital Status: Married    Spouse Name: N/A    Number of Children: N/A  . Years of Education: N/A   Occupational History  . Beautician    Social History Main Topics  . Smoking status: Never Smoker   . Smokeless tobacco: Not on file  . Alcohol Use: No  . Drug Use: No  . Sexual Activity: Yes    Partners: Male    Birth Control/ Protection: Post-menopausal   Other Topics Concern  . Not on file   Social History Narrative  . No narrative on file    Current Outpatient Prescriptions on File Prior to Visit  Medication Sig Dispense Refill  . amLODipine (NORVASC) 2.5 MG tablet TAKE 1 TABLET (2.5 MG TOTAL) BY MOUTH DAILY.  90 tablet  0  . aspirin EC 81 MG tablet Take 81 mg by mouth daily.      . fish oil-omega-3 fatty acids 1000 MG capsule Take 1 g by mouth daily.      Marland Kitchen  losartan-hydrochlorothiazide (HYZAAR) 100-25 MG per tablet TAKE 1 TABLET BY MOUTH EVERY DAY  30 tablet  0  . mometasone (NASONEX) 50 MCG/ACT nasal spray Place 2 sprays into the nose daily as needed. As needed for nasal allergies  17 g  11  . Multiple Vitamin (MULTIVITAMIN WITH MINERALS) TABS Take 1 tablet by mouth daily.      . potassium chloride (K-DUR) 10 MEQ tablet Take 1 tablet (10 mEq total) by mouth daily.  30 tablet  11  . pravastatin (PRAVACHOL) 40 MG tablet Take 1 tablet (40 mg total) by mouth daily.  30 tablet  11  . traMADol (ULTRAM) 50 MG tablet Take 1 tablet (50 mg total) by mouth every 6 (six) hours as needed for moderate pain or severe pain.  30 tablet  0   No current facility-administered medications on file prior to visit.    Allergies  Allergen Reactions  . Lisinopril Cough  . Oxycodone Other (See Comments)    Hallucinations    Family History  Problem Relation Age of Onset  . Cancer Neg Hx   . Diabetes Mother   . Heart disease Mother   . Hypertension Mother   . Stroke Mother   .  Diabetes Father   . Heart disease Father     Triple Bypass  . Hypertension Father   . Heart attack Brother   . Congestive Heart Failure Brother   . Hypertension Brother   . Ovarian cancer Sister 27  . Hypertension Sister     BP 132/88  Pulse 75  Temp(Src) 97.8 F (36.6 C) (Oral)  Ht 5\' 3"  (1.6 m)  Wt 227 lb (102.967 kg)  BMI 40.22 kg/m2  SpO2 98%  LMP 08/30/1998     Review of Systems  Constitutional: Negative for fever and unexpected weight change.  HENT: Negative for hearing loss.   Eyes: Negative for visual disturbance.  Respiratory: Negative for shortness of breath.   Cardiovascular: Negative for chest pain.  Gastrointestinal: Negative for anal bleeding.  Endocrine: Negative for cold intolerance.  Genitourinary: Negative for hematuria.  Musculoskeletal: Negative for back pain.  Skin: Negative for rash.  Allergic/Immunologic: Positive for environmental allergies.    Neurological: Negative for syncope and numbness.  Hematological: Bruises/bleeds easily.  Psychiatric/Behavioral: Negative for dysphoric mood.       Objective:   Physical Exam VS: see vs page GEN: no distress.  Morbid obesity HEAD: head: no deformity eyes: no periorbital swelling, no proptosis external nose and ears are normal mouth: no lesion seen NECK: supple, thyroid is not enlarged CHEST WALL: no deformity LUNGS:  Clear to auscultation BREASTS: sees gyn CV: reg rate and rhythm, no murmur ABD: abdomen is soft, nontender.  no hepatosplenomegaly.  not distended.  no hernia GENITALIA/RECTAL: sees gyn MUSCULOSKELETAL: muscle bulk and strength are grossly normal.  no obvious joint swelling.  gait is normal and steady EXTEMITIES: no deformity.  no ulcer on the feet.  feet are of normal color and temp.  no edema PULSES: dorsalis pedis intact bilat.  no carotid bruit NEURO:  cn 2-12 grossly intact.   readily moves all 4's.  sensation is intact to touch on the feet SKIN:  Normal texture and temperature.  No rash or suspicious lesion is visible.   NODES:  None palpable at the neck PSYCH: alert, well-oriented.  Does not appear anxious nor depressed.        Assessment & Plan:    Patient is advised the following: Patient Instructions  please consider these measures for your health:  minimize alcohol.  do not use tobacco products.  have a colonoscopy at least every 10 years from age 67.  Women should have an annual mammogram from age 54.  keep firearms safely stored.  always use seat belts.  have working smoke alarms in your home.  see an eye doctor and dentist regularly.  never drive under the influence of alcohol or drugs (including prescription drugs).   blood tests are being requested for you today.  We'll contact you with results. Please return in 1 year.

## 2014-03-29 NOTE — Patient Instructions (Signed)
please consider these measures for your health:  minimize alcohol.  do not use tobacco products.  have a colonoscopy at least every 10 years from age 61.  Women should have an annual mammogram from age 35.  keep firearms safely stored.  always use seat belts.  have working smoke alarms in your home.  see an eye doctor and dentist regularly.  never drive under the influence of alcohol or drugs (including prescription drugs).   blood tests are being requested for you today.  We'll contact you with results. Please return in 1 year.

## 2014-04-12 ENCOUNTER — Encounter: Payer: Self-pay | Admitting: Endocrinology

## 2014-04-22 ENCOUNTER — Other Ambulatory Visit: Payer: Self-pay | Admitting: Endocrinology

## 2014-05-24 ENCOUNTER — Other Ambulatory Visit: Payer: Self-pay | Admitting: Endocrinology

## 2014-06-23 ENCOUNTER — Other Ambulatory Visit: Payer: Self-pay | Admitting: Endocrinology

## 2014-07-22 ENCOUNTER — Other Ambulatory Visit: Payer: Self-pay | Admitting: Endocrinology

## 2014-08-02 ENCOUNTER — Other Ambulatory Visit: Payer: Self-pay | Admitting: Endocrinology

## 2014-08-27 ENCOUNTER — Other Ambulatory Visit: Payer: Self-pay | Admitting: Endocrinology

## 2014-09-24 ENCOUNTER — Other Ambulatory Visit: Payer: Self-pay | Admitting: Endocrinology

## 2014-10-13 ENCOUNTER — Ambulatory Visit (INDEPENDENT_AMBULATORY_CARE_PROVIDER_SITE_OTHER): Payer: BLUE CROSS/BLUE SHIELD | Admitting: Obstetrics and Gynecology

## 2014-10-13 ENCOUNTER — Encounter: Payer: Self-pay | Admitting: Obstetrics and Gynecology

## 2014-10-13 VITALS — BP 126/80 | HR 84 | Resp 16 | Ht 63.0 in | Wt 214.6 lb

## 2014-10-13 DIAGNOSIS — Z0000 Encounter for general adult medical examination without abnormal findings: Secondary | ICD-10-CM

## 2014-10-13 DIAGNOSIS — G479 Sleep disorder, unspecified: Secondary | ICD-10-CM | POA: Diagnosis not present

## 2014-10-13 DIAGNOSIS — Z01419 Encounter for gynecological examination (general) (routine) without abnormal findings: Secondary | ICD-10-CM | POA: Diagnosis not present

## 2014-10-13 LAB — POCT URINALYSIS DIPSTICK
Bilirubin, UA: NEGATIVE
Blood, UA: NEGATIVE
Glucose, UA: NEGATIVE
Ketones, UA: NEGATIVE
Leukocytes, UA: NEGATIVE
Nitrite, UA: NEGATIVE
Protein, UA: NEGATIVE
Urobilinogen, UA: NEGATIVE
pH, UA: 5

## 2014-10-13 MED ORDER — ZOLPIDEM TARTRATE 5 MG PO TABS: 5.0000 mg | ORAL_TABLET | Freq: Every evening | ORAL | Status: DC | PRN

## 2014-10-13 NOTE — Patient Instructions (Signed)

## 2014-10-13 NOTE — Progress Notes (Signed)
Patient ID: Angela Estrada, female   DOB: 02-06-1951, 64 y.o.   MRN: [id number] 64 y.o. G65P1001 Married [place] American female here for annual exam.    Mother is terminally ill with valvular heart disease.  Status post MIs. In nursing home.   Patient is not sleeping well. Avoiding caffeine.  Tried OTC meds and did not help.  Does not want something too strong for sleeping aid.  Voids often at night if drinking tea in the evening.   Having sciatic nerve problems.  Just did therapy for this.  Does regular exercise.   PCP: Renato Shin, MD  Patient's last menstrual period was 08/30/1998.          Sexually active: Yes.  female partner  The current method of family planning is post menopausal status.    Exercising: Yes.    stationary bike and treadmill. Smoker:  no  Health Maintenance: Pap:  08-29-12 wnl:neg HR HPV History of abnormal Pap:  Yes, 1990 had abnormal pap but normal on repeat--no colposcopy.  Paps normal since. MMG:  10-27-13 fibroglandular density/nl:[facility].   Colonoscopy:  2011 normal [facility].  Next due 2021. BMD:   05-09-10 [facility] Result  wnl TDaP:  10-02-13 Screening Labs:  Hb today: PCP, Urine today: Neg   reports that she has never smoked. She does not have any smokeless tobacco history on file. She reports that she does not drink alcohol or use illicit drugs.  Past Medical History  Diagnosis Date  . ALLERGIC RHINITIS 01/03/2007  . COUGH DUE TO ACE INHIBITORS 08/12/2008    Due to Accupril  . Dizziness and giddiness 04/25/2007  . HYPERCHOLESTEROLEMIA 04/25/2007    rx refused  . HYPERGLYCEMIA 08/12/2008  . HYPERTENSION 01/03/2007  . Arthritis   . Fibroid   . Arthritis     Past Surgical History  Procedure Laterality Date  . Tubal ligation    . Cholecystectomy    . Joint replacement      bilateral knee replacements  . Replacement total knee bilateral  2010  . Incisional hernia repair  02/21/2012    Procedure: LAPAROSCOPIC INCISIONAL  HERNIA;  Surgeon: Harl Bowie, MD;  Location: [facility];  Service: General;  Laterality: N/A;  laparoscopic incisional hernia repair with mesh  . Cesarean section  25 years ago  . Dilation and curettage of uterus  35 years ago    Current Outpatient Prescriptions  Medication Sig Dispense Refill  . amLODipine (NORVASC) 2.5 MG tablet TAKE 1 TABLET (2.5 MG TOTAL) BY MOUTH DAILY. 90 tablet 0  . aspirin EC 81 MG tablet Take 81 mg by mouth daily.    . fish oil-omega-3 fatty acids 1000 MG capsule Take 1 g by mouth daily.    Marland Kitchen HYDROcodone-acetaminophen (NORCO/VICODIN) 5-325 MG per tablet Take 1 tablet by mouth 2 (two) times daily as needed. for pain  0  . losartan-hydrochlorothiazide (HYZAAR) 100-25 MG per tablet TAKE 1 TABLET BY MOUTH EVERY DAY 30 tablet 1  . mometasone (NASONEX) 50 MCG/ACT nasal spray Place 2 sprays into the nose daily as needed. As needed for nasal allergies 17 g 11  . Multiple Vitamin (MULTIVITAMIN WITH MINERALS) TABS Take 1 tablet by mouth daily.    . naproxen (NAPROSYN) 500 MG tablet Take 1 tablet by mouth 2 (two) times daily.  0  . potassium chloride (K-DUR) 10 MEQ tablet Take 1 tablet (10 mEq total) by mouth daily. 30 tablet 11  . pravastatin (PRAVACHOL) 40 MG tablet  TAKE 1 TABLET (40 MG TOTAL) BY MOUTH DAILY. 30 tablet 8   No current facility-administered medications for this visit.    Family History  Problem Relation Age of Onset  . Cancer Neg Hx   . Diabetes Mother   . Heart disease Mother   . Hypertension Mother   . Stroke Mother   . Diabetes Father   . Heart disease Father     Triple Bypass  . Hypertension Father   . Heart attack Brother   . Congestive Heart Failure Brother   . Hypertension Brother   . Ovarian cancer Sister 36  . Hypertension Sister     ROS:  Pertinent items are noted in HPI.  Otherwise, a comprehensive ROS was negative.  Exam:   BP 126/80 mmHg  Pulse 84  Resp 16  Ht 5\' 3"  (1.6 m)  Wt 214 lb 9.6 oz (97.342 kg)  BMI 38.02 kg/m2   LMP 08/30/1998    General appearance: alert, cooperative and appears stated age Head: Normocephalic, without obvious abnormality, atraumatic Neck: no adenopathy, supple, symmetrical, trachea midline and thyroid normal to inspection and palpation Lungs: clear to auscultation bilaterally Breasts: normal appearance, no masses or tenderness, Inspection negative, No nipple retraction or dimpling, No nipple discharge or bleeding, No axillary or supraclavicular adenopathy Heart: regular rate and rhythm Abdomen: soft, non-tender; bowel sounds normal; no masses,  no organomegaly Extremities: extremities normal, atraumatic, no cyanosis or edema Skin: Skin color, texture, turgor normal. No rashes or lesions Lymph nodes: Cervical, supraclavicular, and axillary nodes normal. No abnormal inguinal nodes palpated Neurologic: Grossly normal  Pelvic: External genitalia:  no lesions              Urethra:  normal appearing urethra with no masses, tenderness or lesions              Bartholins and Skenes: normal                 Vagina: normal appearing vagina with normal color and discharge, no lesions              Cervix: no lesions              Pap taken: No. Bimanual Exam:  Uterus:  normal size, contour, position, consistency, mobility, non-tender              Adnexa: normal adnexa and no mass, fullness, tenderness              Rectovaginal: Yes.  .  Confirms.              Anus:  normal sphincter tone, no lesions  Chaperone was present for exam.  Assessment:   Well woman visit with normal exam. Insomnia.  FH of ovarian cancer.   Plan: Yearly mammogram recommended after age 11.  Recommended self breast exam.  Pap and HR HPV as above. Discussed Calcium, Vitamin D, regular exercise program including cardiovascular and weight bearing exercise. Labs performed.  No..   See orders. Refills given on medications.  No Rx for Ambien 5 mg po q hs prn.  #30, RF none.   Instructed in use.  Follow up annually  and prn.      After visit summary provided.

## 2014-11-03 ENCOUNTER — Other Ambulatory Visit: Payer: Self-pay | Admitting: Endocrinology

## 2014-11-29 ENCOUNTER — Other Ambulatory Visit: Payer: Self-pay | Admitting: Endocrinology

## 2015-01-20 ENCOUNTER — Other Ambulatory Visit: Payer: Self-pay

## 2015-01-20 DIAGNOSIS — Z1231 Encounter for screening mammogram for malignant neoplasm of breast: Secondary | ICD-10-CM

## 2015-01-27 ENCOUNTER — Ambulatory Visit
Admission: RE | Admit: 2015-01-27 | Discharge: 2015-01-27 | Disposition: A | Discharge status: Home / Self Care | Payer: BLUE CROSS/BLUE SHIELD | Source: Ambulatory Visit

## 2015-01-27 DIAGNOSIS — Z1231 Encounter for screening mammogram for malignant neoplasm of breast: Secondary | ICD-10-CM

## 2015-02-03 ENCOUNTER — Other Ambulatory Visit: Payer: Self-pay | Admitting: Endocrinology

## 2015-02-14 ENCOUNTER — Other Ambulatory Visit: Payer: Self-pay | Admitting: Endocrinology

## 2015-03-01 ENCOUNTER — Encounter: Payer: Self-pay | Admitting: Endocrinology

## 2015-03-01 ENCOUNTER — Ambulatory Visit (INDEPENDENT_AMBULATORY_CARE_PROVIDER_SITE_OTHER): Payer: BLUE CROSS/BLUE SHIELD | Admitting: Endocrinology

## 2015-03-01 VITALS — BP 132/87 | HR 94 | Temp 98.4°F | Ht 63.0 in | Wt 217.0 lb

## 2015-03-01 DIAGNOSIS — M545 Low back pain: Secondary | ICD-10-CM | POA: Diagnosis not present

## 2015-03-01 DIAGNOSIS — Z23 Encounter for immunization: Secondary | ICD-10-CM | POA: Diagnosis not present

## 2015-03-01 MED ORDER — LOSARTAN POTASSIUM-HCTZ 100-25 MG PO TABS: 1.0000 | ORAL_TABLET | Freq: Every day | ORAL | Status: DC

## 2015-03-01 MED ORDER — LIDOCAINE 5 % EX PTCH: 1.0000 | MEDICATED_PATCH | CUTANEOUS | Status: DC

## 2015-03-01 NOTE — Patient Instructions (Addendum)
Please come back for a regular physical appointment in 5-6 weeks (must be after 03/30/15). i have sent a prescription to your pharmacy, for pain patches.

## 2015-03-01 NOTE — Progress Notes (Signed)
Subjective:    Patient ID: Angela Estrada, female    DOB: 1951/05/25, 64 y.o.   MRN: [id number]  HPI Pt states 6 mos of moderate pain at the lower back.  No assoc numbness.  No help with steroid injections x 2.  She does not take NSAID, out of concern for her stomach and kidneys.  She took tramadol, but says she did not like the way it made her feel Past Medical History  Diagnosis Date  . ALLERGIC RHINITIS 01/03/2007  . COUGH DUE TO ACE INHIBITORS 08/12/2008    Due to Accupril  . Dizziness and giddiness 04/25/2007  . HYPERCHOLESTEROLEMIA 04/25/2007    rx refused  . HYPERGLYCEMIA 08/12/2008  . HYPERTENSION 01/03/2007  . Arthritis   . Fibroid   . Arthritis     Past Surgical History  Procedure Laterality Date  . Tubal ligation    . Cholecystectomy    . Joint replacement      bilateral knee replacements  . Replacement total knee bilateral  2010  . Incisional hernia repair  02/21/2012    Procedure: LAPAROSCOPIC INCISIONAL HERNIA;  Surgeon: Harl Bowie, MD;  Location: [facility];  Service: General;  Laterality: N/A;  laparoscopic incisional hernia repair with mesh  . Cesarean section  25 years ago  . Dilation and curettage of uterus  35 years ago    Social History   Social History  . Marital Status: Married    Spouse Name: N/A  . Number of Children: N/A  . Years of Education: N/A   Occupational History  . Beautician    Social History Main Topics  . Smoking status: Never Smoker   . Smokeless tobacco: Not on file  . Alcohol Use: No  . Drug Use: No  . Sexual Activity:    Partners: Male    Birth Control/ Protection: Post-menopausal   Other Topics Concern  . Not on file   Social History Narrative    Current Outpatient Prescriptions on File Prior to Visit  Medication Sig Dispense Refill  . amLODipine (NORVASC) 2.5 MG tablet TAKE 1 TABLET BY MOUTH EVERY DAY 90 tablet 0  . aspirin EC 81 MG tablet Take 81 mg by mouth daily.    . fish oil-omega-3 fatty acids 1000 MG  capsule Take 1 g by mouth daily.    Marland Kitchen HYDROcodone-acetaminophen (NORCO/VICODIN) 5-325 MG per tablet Take 1 tablet by mouth 2 (two) times daily as needed. for pain  0  . KLOR-CON 10 10 MEQ tablet TAKE 1 TABLET BY MOUTH EVERY DAY 30 tablet 11  . mometasone (NASONEX) 50 MCG/ACT nasal spray Place 2 sprays into the nose daily as needed. As needed for nasal allergies 17 g 11  . Multiple Vitamin (MULTIVITAMIN WITH MINERALS) TABS Take 1 tablet by mouth daily.    . pravastatin (PRAVACHOL) 40 MG tablet TAKE 1 TABLET (40 MG TOTAL) BY MOUTH DAILY. 30 tablet 8  . zolpidem (AMBIEN) 5 MG tablet Take 1 tablet (5 mg total) by mouth at bedtime as needed for sleep. 30 tablet 0   No current facility-administered medications on file prior to visit.    Allergies  Allergen Reactions  . Lisinopril Cough  . Oxycodone Other (See Comments)    Hallucinations    Family History  Problem Relation Age of Onset  . Cancer Neg Hx   . Diabetes Mother   . Heart disease Mother   . Hypertension Mother   . Stroke Mother   . Diabetes Father   .  Heart disease Father     Triple Bypass  . Hypertension Father   . Heart attack Brother   . Congestive Heart Failure Brother   . Hypertension Brother   . Ovarian cancer Sister 34  . Hypertension Sister     BP 132/87 mmHg  Pulse 94  Temp(Src) 98.4 F (36.9 C) (Oral)  Ht 5\' 3"  (1.6 m)  Wt 217 lb (98.431 kg)  BMI 38.45 kg/m2  SpO2 95%  LMP 08/30/1998    Review of Systems Denies bowel or bladder retention.    Objective:   Physical Exam VITAL SIGNS:  See vs page GENERAL: no distress Spine: nontender Gait: normal and steady.         Assessment & Plan:  Low back pain, worse Anxiety: this limits pain rx options.    Patient is advised the following: Patient Instructions  Please come back for a regular physical appointment in 5-6 weeks (must be after 03/30/15). i have sent a prescription to your pharmacy, for pain patches.

## 2015-04-05 ENCOUNTER — Ambulatory Visit (INDEPENDENT_AMBULATORY_CARE_PROVIDER_SITE_OTHER): Payer: BLUE CROSS/BLUE SHIELD | Admitting: Endocrinology

## 2015-04-05 ENCOUNTER — Encounter: Payer: Self-pay | Admitting: Endocrinology

## 2015-04-05 VITALS — BP 136/90 | HR 74 | Temp 97.8°F | Ht 63.0 in | Wt 210.0 lb

## 2015-04-05 DIAGNOSIS — E7800 Pure hypercholesterolemia, unspecified: Secondary | ICD-10-CM

## 2015-04-05 DIAGNOSIS — I10 Essential (primary) hypertension: Secondary | ICD-10-CM | POA: Diagnosis not present

## 2015-04-05 DIAGNOSIS — R7309 Other abnormal glucose: Secondary | ICD-10-CM

## 2015-04-05 DIAGNOSIS — Z0000 Encounter for general adult medical examination without abnormal findings: Secondary | ICD-10-CM | POA: Diagnosis not present

## 2015-04-05 LAB — HEPATIC FUNCTION PANEL
ALT: 16 U/L (ref 0–35)
AST: 22 U/L (ref 0–37)
Albumin: 4 g/dL (ref 3.5–5.2)
Alkaline Phosphatase: 70 U/L (ref 39–117)
Bilirubin, Direct: 0.2 mg/dL (ref 0.0–0.3)
Total Bilirubin: 0.8 mg/dL (ref 0.2–1.2)
Total Protein: 7.4 g/dL (ref 6.0–8.3)

## 2015-04-05 LAB — CBC WITH DIFFERENTIAL/PLATELET
Basophils Absolute: 0 10*3/uL (ref 0.0–0.1)
Basophils Relative: 0.4 % (ref 0.0–3.0)
Eosinophils Absolute: 0.1 10*3/uL (ref 0.0–0.7)
Eosinophils Relative: 1.5 % (ref 0.0–5.0)
HCT: 42.3 % (ref 36.0–46.0)
Hemoglobin: 14.3 g/dL (ref 12.0–15.0)
Lymphocytes Relative: 26.9 % (ref 12.0–46.0)
Lymphs Abs: 1.9 10*3/uL (ref 0.7–4.0)
MCHC: 33.8 g/dL (ref 30.0–36.0)
MCV: 89.2 fl (ref 78.0–100.0)
Monocytes Absolute: 0.5 10*3/uL (ref 0.1–1.0)
Monocytes Relative: 8 % (ref 3.0–12.0)
Neutro Abs: 4.4 10*3/uL (ref 1.4–7.7)
Neutrophils Relative %: 63.2 % (ref 43.0–77.0)
Platelets: 297 10*3/uL (ref 150.0–400.0)
RBC: 4.75 Mil/uL (ref 3.87–5.11)
RDW: 13.8 % (ref 11.5–15.5)
WBC: 6.9 10*3/uL (ref 4.0–10.5)

## 2015-04-05 LAB — BASIC METABOLIC PANEL
BUN: 9 mg/dL (ref 6–23)
CO2: 33 mEq/L — ABNORMAL HIGH (ref 19–32)
Calcium: 9.8 mg/dL (ref 8.4–10.5)
Chloride: 100 mEq/L (ref 96–112)
Creatinine, Ser: 0.69 mg/dL (ref 0.40–1.20)
GFR: 110.62 mL/min (ref >60.00)
Glucose, Bld: 97 mg/dL (ref 70–99)
Potassium: 3.7 mEq/L (ref 3.5–5.1)
Sodium: 139 mEq/L (ref 135–145)

## 2015-04-05 LAB — LIPID PANEL
Cholesterol: 153 mg/dL (ref 0–200)
HDL: 54.2 mg/dL (ref >39.00)
LDL Cholesterol: 85 mg/dL (ref 0–99)
NonHDL: 99.27
Total CHOL/HDL Ratio: 3
Triglycerides: 69 mg/dL (ref 0.0–149.0)
VLDL: 13.8 mg/dL (ref 0.0–40.0)

## 2015-04-05 LAB — URINALYSIS, ROUTINE W REFLEX MICROSCOPIC
Bilirubin Urine: NEGATIVE
Hgb urine dipstick: NEGATIVE
Ketones, ur: NEGATIVE
Leukocytes, UA: NEGATIVE
Nitrite: NEGATIVE
Specific Gravity, Urine: 1.02 (ref 1.000–1.030)
Total Protein, Urine: NEGATIVE
Urine Glucose: NEGATIVE
Urobilinogen, UA: 0.2 (ref 0.0–1.0)
pH: 7 (ref 5.0–8.0)

## 2015-04-05 LAB — MICROALBUMIN / CREATININE URINE RATIO
Creatinine,U: 181.7 mg/dL
Microalb Creat Ratio: 0.4 mg/g (ref 0.0–30.0)
Microalb, Ur: 0.7 mg/dL (ref 0.0–1.9)

## 2015-04-05 LAB — HEMOGLOBIN A1C: Hgb A1c MFr Bld: 6 % (ref 4.6–6.5)

## 2015-04-05 LAB — TSH: TSH: 1.37 u[IU]/mL (ref 0.35–4.50)

## 2015-04-05 MED ORDER — PRAVASTATIN SODIUM 20 MG PO TABS: 20.0000 mg | ORAL_TABLET | Freq: Every day | ORAL | Status: DC

## 2015-04-05 NOTE — Patient Instructions (Addendum)
blood tests are requested for you today.  We'll let you know about the results.   please consider these measures for your health:  minimize alcohol.  do not use tobacco products.  have a colonoscopy at least every 10 years from age 64.  Women should have an annual mammogram from age 46.  keep firearms safely stored.  always use seat belts.  have working smoke alarms in your home.  see an eye doctor and dentist regularly.  never drive under the influence of alcohol or drugs (including prescription drugs).   it is critically important to prevent falling down (keep floor areas well-lit, dry, and free of loose objects.  If you have a cane, walker, or wheelchair, you should use it, even for short trips around the house.  Also, try not to rush).   Please return in 1 year.

## 2015-04-05 NOTE — Progress Notes (Signed)
we discussed code status.  pt requests full code, but would not want to be started or maintained on artificial life-support measures if there was not a reasonable chance of recovery 

## 2015-04-05 NOTE — Progress Notes (Signed)
Subjective:    Patient ID: Angela Estrada, female    DOB: 08-10-1950, 64 y.o.   MRN: [id number]  HPI Pt is here for regular wellness examination, and is feeling pretty well in general, and says chronic med probs are stable, except as noted below Past Medical History  Diagnosis Date  . ALLERGIC RHINITIS 01/03/2007  . COUGH DUE TO ACE INHIBITORS 08/12/2008    Due to Accupril  . Dizziness and giddiness 04/25/2007  . HYPERCHOLESTEROLEMIA 04/25/2007    rx refused  . HYPERGLYCEMIA 08/12/2008  . HYPERTENSION 01/03/2007  . Arthritis   . Fibroid   . Arthritis     Past Surgical History  Procedure Laterality Date  . Tubal ligation    . Cholecystectomy    . Joint replacement      bilateral knee replacements  . Replacement total knee bilateral  2010  . Incisional hernia repair  02/21/2012    Procedure: LAPAROSCOPIC INCISIONAL HERNIA;  Surgeon: Harl Bowie, MD;  Location: [facility];  Service: General;  Laterality: N/A;  laparoscopic incisional hernia repair with mesh  . Cesarean section  25 years ago  . Dilation and curettage of uterus  35 years ago    Social History   Social History  . Marital Status: Married    Spouse Name: N/A  . Number of Children: N/A  . Years of Education: N/A   Occupational History  . Beautician    Social History Main Topics  . Smoking status: Never Smoker   . Smokeless tobacco: Not on file  . Alcohol Use: No  . Drug Use: No  . Sexual Activity:    Partners: Male    Birth Control/ Protection: Post-menopausal   Other Topics Concern  . Not on file   Social History Narrative    Current Outpatient Prescriptions on File Prior to Visit  Medication Sig Dispense Refill  . amLODipine (NORVASC) 2.5 MG tablet TAKE 1 TABLET BY MOUTH EVERY DAY 90 tablet 0  . aspirin EC 81 MG tablet Take 81 mg by mouth daily.    . fish oil-omega-3 fatty acids 1000 MG capsule Take 1 g by mouth daily.    Marland Kitchen KLOR-CON 10 10 MEQ tablet TAKE 1 TABLET BY MOUTH EVERY DAY 30  tablet 11  . lidocaine (LIDODERM) 5 % Place 1 patch onto the skin daily. Remove & Discard patch within 12 hours or as directed by MD 30 patch 5  . losartan-hydrochlorothiazide (HYZAAR) 100-25 MG per tablet Take 1 tablet by mouth daily. 90 tablet 3  . mometasone (NASONEX) 50 MCG/ACT nasal spray Place 2 sprays into the nose daily as needed. As needed for nasal allergies 17 g 11  . Multiple Vitamin (MULTIVITAMIN WITH MINERALS) TABS Take 1 tablet by mouth daily.     No current facility-administered medications on file prior to visit.    Allergies  Allergen Reactions  . Lisinopril Cough  . Oxycodone Other (See Comments)    Hallucinations    Family History  Problem Relation Age of Onset  . Cancer Neg Hx   . Diabetes Mother   . Heart disease Mother   . Hypertension Mother   . Stroke Mother   . Diabetes Father   . Heart disease Father     Triple Bypass  . Hypertension Father   . Heart attack Brother   . Congestive Heart Failure Brother   . Hypertension Brother   . Ovarian cancer Sister 10  . Hypertension Sister     BP  136/90 mmHg  Pulse 74  Temp(Src) 97.8 F (36.6 C) (Oral)  Ht 5\' 3"  (1.6 m)  Wt 210 lb (95.255 kg)  BMI 37.21 kg/m2  SpO2 98%  LMP 08/30/1998  Review of Systems  Constitutional: Negative for fever and unexpected weight change.  HENT: Negative for hearing loss.   Eyes: Negative for visual disturbance.  Respiratory: Negative for shortness of breath.   Cardiovascular: Negative for chest pain.  Gastrointestinal: Negative for anal bleeding.  Endocrine: Negative for cold intolerance.  Genitourinary: Negative for hematuria.  Musculoskeletal: Negative for myalgias.  Skin: Negative for rash.  Allergic/Immunologic: Positive for environmental allergies.  Neurological: Negative for syncope, numbness and headaches.  Hematological: Bruises/bleeds easily.  Psychiatric/Behavioral: Negative for dysphoric mood.       Objective:   Physical Exam VS: see vs page GEN:  no distress HEAD: head: no deformity eyes: no periorbital swelling, no proptosis external nose and ears are normal mouth: no lesion seen NECK: supple, thyroid is not enlarged CHEST WALL: no deformity LUNGS:  Clear to auscultation.   BREASTS: sees gyn.   CV: reg rate and rhythm, no murmur.  ABD: abdomen is soft, nontender.  no hepatosplenomegaly.  not distended.  no hernia GENITALIA/RECTAL: sees gyn.   MUSCULOSKELETAL: muscle bulk and strength are grossly normal.  no obvious joint swelling.  gait is steady with a cane. EXTEMITIES: no deformity.  no ulcer on the feet.  feet are of normal color and temp.  no edema PULSES: dorsalis pedis intact bilat.  no carotid bruit NEURO:  cn 2-12 grossly intact.   readily moves all 4's.  sensation is intact to touch on the feet SKIN:  Normal texture and temperature.  No rash or suspicious lesion is visible.   NODES:  None palpable at the neck.   PSYCH: alert, well-oriented.  Does not appear anxious nor depressed.   i personally reviewed electrocardiogram tracing (today): Indication: dyslipidemia Impression: normal     Assessment & Plan:  Wellness visit today, with problems stable, except as noted.   Patient is advised the following: Patient Instructions  blood tests are requested for you today.  We'll let you know about the results.   please consider these measures for your health:  minimize alcohol.  do not use tobacco products.  have a colonoscopy at least every 10 years from age 47.  Women should have an annual mammogram from age 23.  keep firearms safely stored.  always use seat belts.  have working smoke alarms in your home.  see an eye doctor and dentist regularly.  never drive under the influence of alcohol or drugs (including prescription drugs).   it is critically important to prevent falling down (keep floor areas well-lit, dry, and free of loose objects.  If you have a cane, walker, or wheelchair, you should use it, even for short trips  around the house.  Also, try not to rush).   Please return in 1 year.

## 2015-04-06 ENCOUNTER — Other Ambulatory Visit: Payer: Self-pay | Admitting: Endocrinology

## 2015-05-18 ENCOUNTER — Other Ambulatory Visit: Payer: Self-pay | Admitting: Endocrinology

## 2015-08-23 ENCOUNTER — Other Ambulatory Visit: Payer: Self-pay | Admitting: Endocrinology

## 2015-10-28 ENCOUNTER — Ambulatory Visit: Payer: BLUE CROSS/BLUE SHIELD | Admitting: Obstetrics and Gynecology

## 2015-11-10 ENCOUNTER — Other Ambulatory Visit: Payer: Self-pay | Admitting: Endocrinology

## 2016-01-31 ENCOUNTER — Other Ambulatory Visit: Payer: Self-pay | Admitting: Endocrinology

## 2016-02-15 ENCOUNTER — Other Ambulatory Visit: Payer: Self-pay | Admitting: Orthopedic Surgery

## 2016-02-22 ENCOUNTER — Other Ambulatory Visit: Payer: Self-pay | Admitting: Endocrinology

## 2016-03-04 ENCOUNTER — Other Ambulatory Visit: Payer: Self-pay | Admitting: Endocrinology

## 2016-03-08 NOTE — Pre-Procedure Instructions (Addendum)
DEMARIE ATZ  03/08/2016      [id number] - [place], [place] [address] [place] [place] [address] Phone: [phone] Fax: [phone]  [id number] - [place], [address] [place] [address] Phone: [phone] Fax: [phone]    Your procedure is scheduled on Mon, Oct 9 @ 12:40 PM  Report to [facility] Admitting at 10:40 AM  Call this number if you have problems the morning of surgery:  [phone]   Remember:  Do not eat food or drink liquids after midnight.  Take these medicines the morning of surgery with A SIP OF WATER Amlodipine(Norvasc),Pain Pill(if needed),Nasonex(Mometasone)             Stop taking your Aspirin along with any Vitamins or Herbal Medications. No Goody's,BC's,Aleve,Advil,Motrin,Ibuprofen,or Fish Oil.   Do not wear jewelry, make-up or nail polish.  Do not wear lotions, powders, or perfumes, or deoderant.  Do not shave 48 hours prior to surgery.  Men may shave face and neck.  Do not bring valuables to the hospital.  [facility] is not responsible for any belongings or valuables.  Contacts, dentures or bridgework may not be worn into surgery.  Leave your suitcase in the car.  After surgery it may be brought to your room.  For patients admitted to the hospital, discharge time will be determined by your treatment team.  Patients discharged the day of surgery will not be allowed to drive home.    [facility]  Before surgery, you can play an important role.  Because skin is not sterile, your skin needs to be as free of germs as possible.  You can reduce the number of germs on you skin by washing with CHG (chlorahexidine gluconate) soap before surgery.  CHG is an antiseptic cleaner which kills germs and bonds with the skin to continue killing germs even after washing.  Please DO NOT use if you  have an allergy to CHG or antibacterial soaps.  If your skin becomes reddened/irritated stop using the CHG and inform your nurse when you arrive at Short Stay.  Do not shave (including legs and underarms) for at least 48 hours prior to the first CHG shower.  You may shave your face.  Please follow these instructions carefully:   1.  Shower with CHG Soap the night before surgery and the                                morning of Surgery.  2.  If you choose to wash your hair, wash your hair first as usual with your       normal shampoo.  3.  After you shampoo, rinse your hair and body thoroughly to remove the                      Shampoo.  4.  Use CHG as you would any other liquid soap.  You can apply chg directly       to the skin and wash gently with scrungie or a clean washcloth.  5.  Apply the CHG Soap to your body ONLY FROM THE NECK DOWN.        Do not use on open wounds or open sores.  Avoid contact with your eyes,  ears, mouth and genitals (private parts).  Wash genitals (private parts)       with your normal soap.  6.  Wash thoroughly, paying special attention to the area where your surgery        will be performed.  7.  Thoroughly rinse your body with warm water from the neck down.  8.  DO NOT shower/wash with your normal soap after using and rinsing off       the CHG Soap.  9.  Pat yourself dry with a clean towel.            10.  Wear clean pajamas.            11.  Place clean sheets on your bed the night of your first shower and do not        sleep with pets.  Day of Surgery  Do not apply any lotions/deoderants the morning of surgery.  Please wear clean clothes to the hospital/surgery center.    Please read over the following fact sheets that you were given. Pain Booklet, Coughing and Deep Breathing and Surgical Site Infection Prevention

## 2016-03-09 ENCOUNTER — Ambulatory Visit (HOSPITAL_COMMUNITY)
Admission: RE | Admit: 2016-03-09 | Discharge: 2016-03-09 | Disposition: A | Discharge status: Home / Self Care | Payer: BLUE CROSS/BLUE SHIELD | Source: Ambulatory Visit | Attending: Orthopedic Surgery | Admitting: Orthopedic Surgery

## 2016-03-09 ENCOUNTER — Encounter (HOSPITAL_COMMUNITY)
Admission: RE | Admit: 2016-03-09 | Discharge: 2016-03-09 | Disposition: A | Discharge status: Home / Self Care | Payer: BLUE CROSS/BLUE SHIELD | Source: Ambulatory Visit | Attending: Orthopedic Surgery | Admitting: Orthopedic Surgery

## 2016-03-09 ENCOUNTER — Encounter (HOSPITAL_COMMUNITY): Payer: Self-pay

## 2016-03-09 DIAGNOSIS — Z01818 Encounter for other preprocedural examination: Secondary | ICD-10-CM

## 2016-03-09 DIAGNOSIS — Z01812 Encounter for preprocedural laboratory examination: Secondary | ICD-10-CM | POA: Insufficient documentation

## 2016-03-09 HISTORY — DX: Nocturia: R35.1

## 2016-03-09 HISTORY — DX: Hypokalemia: E87.6

## 2016-03-09 HISTORY — DX: Pneumonia, unspecified organism: J18.9

## 2016-03-09 HISTORY — DX: Pain in unspecified joint: M25.50

## 2016-03-09 LAB — CBC WITH DIFFERENTIAL/PLATELET
Basophils Absolute: 0.1 10*3/uL (ref 0.0–0.1)
Basophils Relative: 1 %
Eosinophils Absolute: 0.1 10*3/uL (ref 0.0–0.7)
Eosinophils Relative: 1 %
HCT: 43.5 % (ref 36.0–46.0)
Hemoglobin: 14.5 g/dL (ref 12.0–15.0)
Lymphocytes Relative: 17 %
Lymphs Abs: 1.5 10*3/uL (ref 0.7–4.0)
MCH: 30.5 pg (ref 26.0–34.0)
MCHC: 33.3 g/dL (ref 30.0–36.0)
MCV: 91.6 fL (ref 78.0–100.0)
Monocytes Absolute: 0.7 10*3/uL (ref 0.1–1.0)
Monocytes Relative: 8 %
Neutro Abs: 6.3 10*3/uL (ref 1.7–7.7)
Neutrophils Relative %: 73 %
Platelets: 286 10*3/uL (ref 150–400)
RBC: 4.75 MIL/uL (ref 3.87–5.11)
RDW: 13.6 % (ref 11.5–15.5)
WBC: 8.6 10*3/uL (ref 4.0–10.5)

## 2016-03-09 LAB — BASIC METABOLIC PANEL
Anion gap: 8 (ref 5–15)
BUN: 10 mg/dL (ref 6–20)
CO2: 29 mmol/L (ref 22–32)
Calcium: 9.9 mg/dL (ref 8.9–10.3)
Chloride: 101 mmol/L (ref 101–111)
Creatinine, Ser: 0.64 mg/dL (ref 0.44–1.00)
GFR calc Af Amer: >60 mL/min (ref >60)
GFR calc non Af Amer: >60 mL/min (ref >60)
Glucose, Bld: 72 mg/dL (ref 65–99)
Potassium: 3.3 mmol/L — ABNORMAL LOW (ref 3.5–5.1)
Sodium: 138 mmol/L (ref 135–145)

## 2016-03-09 LAB — TYPE AND SCREEN
ABO/RH(D): A POS
Antibody Screen: NEGATIVE

## 2016-03-09 LAB — ABO/RH: ABO/RH(D): A POS

## 2016-03-09 LAB — APTT: aPTT: 33 seconds (ref 24–36)

## 2016-03-09 LAB — SURGICAL PCR SCREEN
MRSA, PCR: NEGATIVE
Staphylococcus aureus: NEGATIVE

## 2016-03-09 LAB — PROTIME-INR
INR: 0.97
Prothrombin Time: 12.9 seconds (ref 11.4–15.2)

## 2016-03-09 MED ORDER — CHLORHEXIDINE GLUCONATE 4 % EX LIQD: 60.0000 mL | Freq: Once | CUTANEOUS | Status: DC

## 2016-03-09 NOTE — Progress Notes (Signed)
Cardiologist denies  Medical Md is Dr.Ellison  Stress test done > 5 yrs ago  Echo denies  Heart cath denies  EKG in epic from 04-05-15  CXR denies in past yr

## 2016-03-15 NOTE — H&P (Signed)
TOTAL HIP ADMISSION H&P  Patient is admitted for right total hip arthroplasty.  Subjective:  Chief Complaint: right hip pain  HPI: Angela Estrada, 65 y.o. female, has a history of pain and functional disability in the right hip(s) due to trauma and patient has failed non-surgical conservative treatments for greater than 12 weeks to include NSAID's and/or analgesics, use of assistive devices, weight reduction as appropriate and activity modification.  Onset of symptoms was gradual starting several years ago with gradually worsening course since that time.The patient noted no past surgery on the right hip(s).  Patient currently rates pain in the right hip at 10 out of 10 with activity. Patient has night pain, worsening of pain with activity and weight bearing, trendelenberg gait, pain that interfers with activities of daily living and pain with passive range of motion. Patient has evidence of joint subluxation and joint space narrowing by imaging studies. This condition presents safety issues increasing the risk of falls.  There is no current active infection.  Patient Active Problem List   Diagnosis Date Noted  . Hypokalemia 03/29/2014  . Arthralgia 02/11/2014  . Routine general medical examination at a health care facility 03/28/2013  . Nonspecific abnormal findings on radiological and examination of lung field 03/27/2013  . Cough 03/11/2013  . Unspecified disorder of liver 03/09/2013  . Pyuria 03/09/2013  . Arthritis   . Ventral hernia 01/18/2012  . Encounter for long-term (current) use of other medications 07/05/2011  . HYPERGLYCEMIA 08/12/2008  . HYPERCHOLESTEROLEMIA 04/25/2007  . DIZZINESS AND GIDDINESS 04/25/2007  . Essential hypertension 01/03/2007  . ALLERGIC RHINITIS 01/03/2007   Past Medical History:  Diagnosis Date  . Arthritis   . Arthritis   . HYPERCHOLESTEROLEMIA 04/25/2007   takes Pravastatin daily  . HYPERTENSION 01/03/2007   takes Losartan-HCTZ  and Amlodipine  daily  . Hypokalemia    takes Potassium daily  . Joint pain   . Nocturia   . Pneumonia 2 yrs ago    Past Surgical History:  Procedure Laterality Date  . CESAREAN SECTION  25 years ago  . CHOLECYSTECTOMY    . COLONOSCOPY    . DILATION AND CURETTAGE OF UTERUS  35 years ago  . INCISIONAL HERNIA REPAIR  02/21/2012   Procedure: LAPAROSCOPIC INCISIONAL HERNIA;  Surgeon: Harl Bowie, MD;  Location: [facility];  Service: General;  Laterality: N/A;  laparoscopic incisional hernia repair with mesh  . JOINT REPLACEMENT     bilateral knee replacements  . REPLACEMENT TOTAL KNEE BILATERAL  2010  . TUBAL LIGATION      No prescriptions prior to admission.   Allergies  Allergen Reactions  . Lisinopril Cough  . Oxycodone Other (See Comments)    Hallucinations    Social History  Substance Use Topics  . Smoking status: Never Smoker  . Smokeless tobacco: Never Used  . Alcohol use No    Family History  Problem Relation Age of Onset  . Diabetes Mother   . Heart disease Mother   . Hypertension Mother   . Stroke Mother   . Diabetes Father   . Heart disease Father     Triple Bypass  . Hypertension Father   . Heart attack Brother   . Congestive Heart Failure Brother   . Hypertension Brother   . Ovarian cancer Sister 60  . Hypertension Sister   . Cancer Neg Hx      Review of Systems  Constitutional:       Weight changes  HENT:  Sinus problems  Eyes: Negative.   Respiratory: Negative.   Cardiovascular:       HTN  Gastrointestinal: Negative.   Genitourinary: Negative.   Musculoskeletal: Positive for joint pain and myalgias.  Skin: Negative.   Neurological: Negative.   Endo/Heme/Allergies: Bruises/bleeds easily.  Psychiatric/Behavioral: Negative.     Objective:  Physical Exam  Constitutional: She is oriented to person, place, and time. She appears well-developed and well-nourished.  HENT:  Head: Normocephalic and atraumatic.  Eyes: Pupils are equal, round, and  reactive to light.  Neck: Normal range of motion. Neck supple.  Cardiovascular: Intact distal pulses.   Respiratory: Effort normal.  Musculoskeletal: She exhibits tenderness.    Severe pain with any attempts at internal or external rotation of the right hip.  Foot tap is mildly positive.  Skin over the lateral hip and groin is intact.  Normal sensation to the toes.  Well-healed surgical scars to both total knees.    Neurological: She is alert and oriented to person, place, and time.  Skin: Skin is warm and dry.  Psychiatric: She has a normal mood and affect. Her behavior is normal. Judgment and thought content normal.    Vital signs in last 24 hours:    Labs:   Estimated body mass index is 32.44 kg/m as calculated from the following:   Height as of 03/09/16: 5\' 4"  (1.626 m).   Weight as of 03/09/16: 85.7 kg (189 lb).   Imaging Review Plain radiographs demonstrate erosion of the femoral head superiorly about 20% and lateral subluxation of the femoral head.    Assessment/Plan:  End stage arthritis, right hip(s)  The patient history, physical examination, clinical judgement of the provider and imaging studies are consistent with end stage degenerative joint disease of the right hip(s) and total hip arthroplasty is deemed medically necessary. The treatment options including medical management, injection therapy, arthroscopy and arthroplasty were discussed at length. The risks and benefits of total hip arthroplasty were presented and reviewed. The risks due to aseptic loosening, infection, stiffness, dislocation/subluxation,  thromboembolic complications and other imponderables were discussed.  The patient acknowledged the explanation, agreed to proceed with the plan and consent was signed. Patient is being admitted for inpatient treatment for surgery, pain control, PT, OT, prophylactic antibiotics, VTE prophylaxis, progressive ambulation and ADL's and discharge planning.The patient is  planning to be discharged home with home health services

## 2016-03-18 DIAGNOSIS — M1611 Unilateral primary osteoarthritis, right hip: Secondary | ICD-10-CM | POA: Diagnosis present

## 2016-03-18 MED ORDER — CEFAZOLIN SODIUM-DEXTROSE 2-4 GM/100ML-% IV SOLN
2.0000 g | INTRAVENOUS | Status: AC
  Administered 2016-03-19: 2 g via INTRAVENOUS
  Filled 2016-03-18: qty 100

## 2016-03-19 ENCOUNTER — Encounter (HOSPITAL_COMMUNITY): Payer: Self-pay | Admitting: Certified Registered Nurse Anesthetist

## 2016-03-19 ENCOUNTER — Inpatient Hospital Stay (HOSPITAL_COMMUNITY): Payer: BLUE CROSS/BLUE SHIELD | Admitting: Certified Registered Nurse Anesthetist

## 2016-03-19 ENCOUNTER — Encounter (HOSPITAL_COMMUNITY)
Admission: RE | Disposition: A | Discharge status: Home Health | Payer: Self-pay | Source: Ambulatory Visit | Attending: Orthopedic Surgery

## 2016-03-19 ENCOUNTER — Inpatient Hospital Stay (HOSPITAL_COMMUNITY)
Admission: RE | Admit: 2016-03-19 | Discharge: 2016-03-21 | DRG: 470 | Disposition: A | Discharge status: Home Health | Payer: BLUE CROSS/BLUE SHIELD | Source: Ambulatory Visit | Attending: Orthopedic Surgery | Admitting: Orthopedic Surgery

## 2016-03-19 ENCOUNTER — Inpatient Hospital Stay (HOSPITAL_COMMUNITY): Payer: BLUE CROSS/BLUE SHIELD

## 2016-03-19 DIAGNOSIS — I10 Essential (primary) hypertension: Secondary | ICD-10-CM | POA: Diagnosis present

## 2016-03-19 DIAGNOSIS — Z8249 Family history of ischemic heart disease and other diseases of the circulatory system: Secondary | ICD-10-CM | POA: Diagnosis not present

## 2016-03-19 DIAGNOSIS — D62 Acute posthemorrhagic anemia: Secondary | ICD-10-CM | POA: Diagnosis not present

## 2016-03-19 DIAGNOSIS — Z23 Encounter for immunization: Secondary | ICD-10-CM | POA: Diagnosis not present

## 2016-03-19 DIAGNOSIS — Z823 Family history of stroke: Secondary | ICD-10-CM

## 2016-03-19 DIAGNOSIS — E7800 Pure hypercholesterolemia, unspecified: Secondary | ICD-10-CM | POA: Diagnosis present

## 2016-03-19 DIAGNOSIS — M25551 Pain in right hip: Secondary | ICD-10-CM | POA: Diagnosis present

## 2016-03-19 DIAGNOSIS — Z833 Family history of diabetes mellitus: Secondary | ICD-10-CM | POA: Diagnosis not present

## 2016-03-19 DIAGNOSIS — M1611 Unilateral primary osteoarthritis, right hip: Secondary | ICD-10-CM | POA: Diagnosis present

## 2016-03-19 DIAGNOSIS — E876 Hypokalemia: Secondary | ICD-10-CM | POA: Diagnosis present

## 2016-03-19 DIAGNOSIS — Z419 Encounter for procedure for purposes other than remedying health state, unspecified: Secondary | ICD-10-CM

## 2016-03-19 HISTORY — PX: TOTAL HIP ARTHROPLASTY: SHX124

## 2016-03-19 LAB — URINALYSIS, ROUTINE W REFLEX MICROSCOPIC
Bilirubin Urine: NEGATIVE
Glucose, UA: NEGATIVE mg/dL
Hgb urine dipstick: NEGATIVE
Ketones, ur: NEGATIVE mg/dL
Nitrite: NEGATIVE
Protein, ur: NEGATIVE mg/dL
Specific Gravity, Urine: 1.017 (ref 1.005–1.030)
pH: 7 (ref 5.0–8.0)

## 2016-03-19 LAB — URINE MICROSCOPIC-ADD ON

## 2016-03-19 SURGERY — ARTHROPLASTY, HIP, TOTAL, ANTERIOR APPROACH
Anesthesia: Monitor Anesthesia Care | Site: Hip | Laterality: Right

## 2016-03-19 MED ORDER — PHENYLEPHRINE HCL 10 MG/ML IJ SOLN
INTRAMUSCULAR | Status: DC | PRN
  Administered 2016-03-19: 30 ug/min via INTRAVENOUS

## 2016-03-19 MED ORDER — BUPIVACAINE-EPINEPHRINE 0.25% -1:200000 IJ SOLN
INTRAMUSCULAR | Status: DC | PRN
  Administered 2016-03-19: 50 mL

## 2016-03-19 MED ORDER — ONDANSETRON HCL 4 MG/2ML IJ SOLN: 4.0000 mg | Freq: Four times a day (QID) | INTRAMUSCULAR | Status: DC | PRN

## 2016-03-19 MED ORDER — DEXAMETHASONE SODIUM PHOSPHATE 10 MG/ML IJ SOLN
10.0000 mg | Freq: Once | INTRAMUSCULAR | Status: AC
  Administered 2016-03-20: 10 mg via INTRAVENOUS
  Filled 2016-03-19: qty 1

## 2016-03-19 MED ORDER — LOSARTAN POTASSIUM-HCTZ 100-25 MG PO TABS: 1.0000 | ORAL_TABLET | Freq: Every day | ORAL | Status: DC

## 2016-03-19 MED ORDER — PROPOFOL 10 MG/ML IV BOLUS
INTRAVENOUS | Status: DC | PRN
  Administered 2016-03-19: 20 mg via INTRAVENOUS

## 2016-03-19 MED ORDER — BUPIVACAINE LIPOSOME 1.3 % IJ SUSP
20.0000 mL | Freq: Once | INTRAMUSCULAR | Status: AC
  Administered 2016-03-19: 20 mL
  Filled 2016-03-19: qty 20

## 2016-03-19 MED ORDER — METHOCARBAMOL 500 MG PO TABS
500.0000 mg | ORAL_TABLET | Freq: Four times a day (QID) | ORAL | Status: DC | PRN
  Administered 2016-03-19 – 2016-03-20 (×3): 500 mg via ORAL
  Filled 2016-03-19 (×3): qty 1

## 2016-03-19 MED ORDER — DOCUSATE SODIUM 100 MG PO CAPS
100.0000 mg | ORAL_CAPSULE | Freq: Two times a day (BID) | ORAL | Status: DC
  Administered 2016-03-19 – 2016-03-21 (×4): 100 mg via ORAL
  Filled 2016-03-19 (×4): qty 1

## 2016-03-19 MED ORDER — KCL IN DEXTROSE-NACL 20-5-0.45 MEQ/L-%-% IV SOLN
INTRAVENOUS | Status: DC
  Administered 2016-03-19 – 2016-03-20 (×2): via INTRAVENOUS
  Filled 2016-03-19 (×2): qty 1000

## 2016-03-19 MED ORDER — TRANEXAMIC ACID 1000 MG/10ML IV SOLN
2000.0000 mg | Freq: Once | INTRAVENOUS | Status: AC
  Administered 2016-03-19: 2000 mg via TOPICAL
  Filled 2016-03-19: qty 20

## 2016-03-19 MED ORDER — DIPHENHYDRAMINE HCL 12.5 MG/5ML PO ELIX: 12.5000 mg | ORAL_SOLUTION | ORAL | Status: DC | PRN

## 2016-03-19 MED ORDER — ASPIRIN EC 325 MG PO TBEC
325.0000 mg | DELAYED_RELEASE_TABLET | Freq: Every day | ORAL | Status: DC
  Administered 2016-03-20 – 2016-03-21 (×2): 325 mg via ORAL
  Filled 2016-03-19 (×3): qty 1

## 2016-03-19 MED ORDER — POTASSIUM CHLORIDE ER 10 MEQ PO TBCR
10.0000 meq | EXTENDED_RELEASE_TABLET | Freq: Every day | ORAL | Status: DC
  Administered 2016-03-19 – 2016-03-21 (×3): 10 meq via ORAL
  Filled 2016-03-19 (×5): qty 1

## 2016-03-19 MED ORDER — PRAVASTATIN SODIUM 20 MG PO TABS
20.0000 mg | ORAL_TABLET | Freq: Every day | ORAL | Status: DC
  Administered 2016-03-20 – 2016-03-21 (×2): 20 mg via ORAL
  Filled 2016-03-19 (×2): qty 1

## 2016-03-19 MED ORDER — ONDANSETRON HCL 4 MG/2ML IJ SOLN: 4.0000 mg | Freq: Once | INTRAMUSCULAR | Status: DC | PRN

## 2016-03-19 MED ORDER — ACETAMINOPHEN 650 MG RE SUPP: 650.0000 mg | Freq: Four times a day (QID) | RECTAL | Status: DC | PRN

## 2016-03-19 MED ORDER — BISACODYL 5 MG PO TBEC: 5.0000 mg | DELAYED_RELEASE_TABLET | Freq: Every day | ORAL | Status: DC | PRN

## 2016-03-19 MED ORDER — LOSARTAN POTASSIUM 50 MG PO TABS
100.0000 mg | ORAL_TABLET | Freq: Every day | ORAL | Status: DC
  Administered 2016-03-19 – 2016-03-21 (×3): 100 mg via ORAL
  Filled 2016-03-19 (×3): qty 2

## 2016-03-19 MED ORDER — PROPOFOL 500 MG/50ML IV EMUL
INTRAVENOUS | Status: DC | PRN
  Administered 2016-03-19: 100 ug/kg/min via INTRAVENOUS

## 2016-03-19 MED ORDER — FENTANYL CITRATE (PF) 100 MCG/2ML IJ SOLN: 25.0000 ug | INTRAMUSCULAR | Status: DC | PRN

## 2016-03-19 MED ORDER — FENTANYL CITRATE (PF) 100 MCG/2ML IJ SOLN
INTRAMUSCULAR | Status: AC
  Filled 2016-03-19: qty 2

## 2016-03-19 MED ORDER — GABAPENTIN 300 MG PO CAPS
300.0000 mg | ORAL_CAPSULE | Freq: Three times a day (TID) | ORAL | Status: DC
  Administered 2016-03-19 – 2016-03-21 (×6): 300 mg via ORAL
  Filled 2016-03-19 (×6): qty 1

## 2016-03-19 MED ORDER — ACETAMINOPHEN 325 MG PO TABS: 650.0000 mg | ORAL_TABLET | Freq: Four times a day (QID) | ORAL | Status: DC | PRN

## 2016-03-19 MED ORDER — PHENOL 1.4 % MT LIQD: 1.0000 | OROMUCOSAL | Status: DC | PRN

## 2016-03-19 MED ORDER — METOCLOPRAMIDE HCL 5 MG PO TABS: 5.0000 mg | ORAL_TABLET | Freq: Three times a day (TID) | ORAL | Status: DC | PRN

## 2016-03-19 MED ORDER — FLUTICASONE PROPIONATE 50 MCG/ACT NA SUSP
2.0000 | Freq: Every day | NASAL | Status: DC | PRN
  Filled 2016-03-19: qty 16

## 2016-03-19 MED ORDER — PHENYLEPHRINE HCL 10 MG/ML IJ SOLN
INTRAMUSCULAR | Status: DC | PRN
  Administered 2016-03-19 (×3): 120 ug via INTRAVENOUS
  Administered 2016-03-19: 40 ug via INTRAVENOUS

## 2016-03-19 MED ORDER — FLEET ENEMA 7-19 GM/118ML RE ENEM: 1.0000 | ENEMA | Freq: Once | RECTAL | Status: DC | PRN

## 2016-03-19 MED ORDER — METHOCARBAMOL 500 MG PO TABS: 500.0000 mg | ORAL_TABLET | Freq: Two times a day (BID) | ORAL | 0 refills | Status: DC

## 2016-03-19 MED ORDER — HYDROMORPHONE HCL 2 MG PO TABS
2.0000 mg | ORAL_TABLET | ORAL | Status: DC | PRN
  Administered 2016-03-19 – 2016-03-21 (×9): 2 mg via ORAL
  Filled 2016-03-19 (×9): qty 1

## 2016-03-19 MED ORDER — LIDOCAINE HCL (CARDIAC) 20 MG/ML IV SOLN
INTRAVENOUS | Status: DC | PRN
  Administered 2016-03-19: 60 mg via INTRATRACHEAL

## 2016-03-19 MED ORDER — MIDAZOLAM HCL 2 MG/2ML IJ SOLN
INTRAMUSCULAR | Status: AC
  Filled 2016-03-19: qty 2

## 2016-03-19 MED ORDER — HYDROCHLOROTHIAZIDE 25 MG PO TABS
25.0000 mg | ORAL_TABLET | Freq: Every day | ORAL | Status: DC
  Administered 2016-03-19 – 2016-03-21 (×3): 25 mg via ORAL
  Filled 2016-03-19 (×3): qty 1

## 2016-03-19 MED ORDER — 0.9 % SODIUM CHLORIDE (POUR BTL) OPTIME
TOPICAL | Status: DC | PRN
  Administered 2016-03-19: 1000 mL

## 2016-03-19 MED ORDER — SENNOSIDES-DOCUSATE SODIUM 8.6-50 MG PO TABS: 1.0000 | ORAL_TABLET | Freq: Every evening | ORAL | Status: DC | PRN

## 2016-03-19 MED ORDER — HYDROMORPHONE HCL 1 MG/ML IJ SOLN
1.0000 mg | INTRAMUSCULAR | Status: DC | PRN
  Administered 2016-03-19 (×2): 1 mg via INTRAVENOUS
  Filled 2016-03-19 (×2): qty 1

## 2016-03-19 MED ORDER — PROPOFOL 10 MG/ML IV BOLUS
INTRAVENOUS | Status: AC
  Filled 2016-03-19: qty 20

## 2016-03-19 MED ORDER — ALUM & MAG HYDROXIDE-SIMETH 200-200-20 MG/5ML PO SUSP: 30.0000 mL | ORAL | Status: DC | PRN

## 2016-03-19 MED ORDER — METOCLOPRAMIDE HCL 5 MG/ML IJ SOLN: 5.0000 mg | Freq: Three times a day (TID) | INTRAMUSCULAR | Status: DC | PRN

## 2016-03-19 MED ORDER — HYDROMORPHONE HCL 2 MG PO TABS: 2.0000 mg | ORAL_TABLET | Freq: Four times a day (QID) | ORAL | 0 refills | Status: DC | PRN

## 2016-03-19 MED ORDER — ONDANSETRON HCL 4 MG/2ML IJ SOLN
INTRAMUSCULAR | Status: AC
  Filled 2016-03-19: qty 2

## 2016-03-19 MED ORDER — ASPIRIN EC 325 MG PO TBEC: 325.0000 mg | DELAYED_RELEASE_TABLET | Freq: Two times a day (BID) | ORAL | 0 refills | Status: DC

## 2016-03-19 MED ORDER — PHENYLEPHRINE 40 MCG/ML (10ML) SYRINGE FOR IV PUSH (FOR BLOOD PRESSURE SUPPORT)
PREFILLED_SYRINGE | INTRAVENOUS | Status: AC
  Filled 2016-03-19: qty 10

## 2016-03-19 MED ORDER — MENTHOL 3 MG MT LOZG: 1.0000 | LOZENGE | OROMUCOSAL | Status: DC | PRN

## 2016-03-19 MED ORDER — DEXTROSE-NACL 5-0.45 % IV SOLN: INTRAVENOUS | Status: DC

## 2016-03-19 MED ORDER — CELECOXIB 200 MG PO CAPS
200.0000 mg | ORAL_CAPSULE | Freq: Two times a day (BID) | ORAL | Status: DC
  Administered 2016-03-19 – 2016-03-21 (×4): 200 mg via ORAL
  Filled 2016-03-19 (×4): qty 1

## 2016-03-19 MED ORDER — AMLODIPINE BESYLATE 2.5 MG PO TABS
2.5000 mg | ORAL_TABLET | Freq: Every day | ORAL | Status: DC
  Administered 2016-03-20 – 2016-03-21 (×2): 2.5 mg via ORAL
  Filled 2016-03-19 (×2): qty 1

## 2016-03-19 MED ORDER — METHOCARBAMOL 1000 MG/10ML IJ SOLN
500.0000 mg | Freq: Four times a day (QID) | INTRAVENOUS | Status: DC | PRN
  Filled 2016-03-19: qty 5

## 2016-03-19 MED ORDER — FENTANYL CITRATE (PF) 100 MCG/2ML IJ SOLN
INTRAMUSCULAR | Status: DC | PRN
  Administered 2016-03-19: 50 ug via INTRAVENOUS

## 2016-03-19 MED ORDER — ONDANSETRON HCL 4 MG PO TABS: 4.0000 mg | ORAL_TABLET | Freq: Four times a day (QID) | ORAL | Status: DC | PRN

## 2016-03-19 MED ORDER — MIDAZOLAM HCL 5 MG/5ML IJ SOLN
INTRAMUSCULAR | Status: DC | PRN
  Administered 2016-03-19: 2 mg via INTRAVENOUS

## 2016-03-19 MED ORDER — LACTATED RINGERS IV SOLN
INTRAVENOUS | Status: DC
  Administered 2016-03-19 (×3): via INTRAVENOUS

## 2016-03-19 MED ORDER — ONDANSETRON HCL 4 MG/2ML IJ SOLN
INTRAMUSCULAR | Status: DC | PRN
  Administered 2016-03-19: 4 mg via INTRAVENOUS

## 2016-03-19 MED ORDER — TRANEXAMIC ACID 1000 MG/10ML IV SOLN
1000.0000 mg | INTRAVENOUS | Status: AC
  Administered 2016-03-19: 1000 mg via INTRAVENOUS
  Filled 2016-03-19: qty 10

## 2016-03-19 SURGICAL SUPPLY — 46 items
BLADE SURG ROTATE 9660 (MISCELLANEOUS) IMPLANT
CAPT HIP TOTAL 2 ×1 IMPLANT
COVER PERINEAL POST (MISCELLANEOUS) ×2 IMPLANT
COVER SURGICAL LIGHT HANDLE (MISCELLANEOUS) ×2 IMPLANT
DRAPE C-ARM 42X72 X-RAY (DRAPES) ×2 IMPLANT
DRAPE STERI IOBAN 125X83 (DRAPES) ×2 IMPLANT
DRAPE U-SHAPE 47X51 STRL (DRAPES) ×4 IMPLANT
DRSG AQUACEL AG ADV 3.5X10 (GAUZE/BANDAGES/DRESSINGS) ×2 IMPLANT
DURAPREP 26ML APPLICATOR (WOUND CARE) ×2 IMPLANT
ELECT BLADE 4.0 EZ CLEAN MEGAD (MISCELLANEOUS) ×2
ELECT REM PT RETURN 9FT ADLT (ELECTROSURGICAL) ×2
ELECTRODE BLDE 4.0 EZ CLN MEGD (MISCELLANEOUS) ×1 IMPLANT
ELECTRODE REM PT RTRN 9FT ADLT (ELECTROSURGICAL) ×1 IMPLANT
FACESHIELD WRAPAROUND (MASK) ×4 IMPLANT
FACESHIELD WRAPAROUND OR TEAM (MASK) ×2 IMPLANT
GLOVE BIO SURGEON STRL SZ7.5 (GLOVE) ×2 IMPLANT
GLOVE BIO SURGEON STRL SZ8.5 (GLOVE) ×2 IMPLANT
GLOVE BIOGEL PI IND STRL 8 (GLOVE) ×1 IMPLANT
GLOVE BIOGEL PI IND STRL 9 (GLOVE) ×1 IMPLANT
GLOVE BIOGEL PI INDICATOR 8 (GLOVE) ×1
GLOVE BIOGEL PI INDICATOR 9 (GLOVE) ×1
GOWN STRL REUS W/ TWL LRG LVL3 (GOWN DISPOSABLE) ×1 IMPLANT
GOWN STRL REUS W/ TWL XL LVL3 (GOWN DISPOSABLE) ×2 IMPLANT
GOWN STRL REUS W/TWL LRG LVL3 (GOWN DISPOSABLE) ×2
GOWN STRL REUS W/TWL XL LVL3 (GOWN DISPOSABLE) ×4
KIT BASIN OR (CUSTOM PROCEDURE TRAY) ×2 IMPLANT
KIT ROOM TURNOVER OR (KITS) ×2 IMPLANT
MANIFOLD NEPTUNE II (INSTRUMENTS) ×2 IMPLANT
NDL 22X1.5 STRL (OR ONLY) (MISCELLANEOUS) ×2 IMPLANT
NEEDLE 22X1 1/2 OR ONLY (MISCELLANEOUS) ×2
NEEDLE 22X1.5 STRL (OR ONLY) (MISCELLANEOUS) ×2 IMPLANT
NS IRRIG 1000ML POUR BTL (IV SOLUTION) ×2 IMPLANT
PACK TOTAL JOINT (CUSTOM PROCEDURE TRAY) ×2 IMPLANT
PAD ARMBOARD 7.5X6 YLW CONV (MISCELLANEOUS) ×4 IMPLANT
SAW OSC TIP CART 19.5X105X1.3 (SAW) ×2 IMPLANT
SUT VIC AB 1 CTX 36 (SUTURE) ×2
SUT VIC AB 1 CTX36XBRD ANBCTR (SUTURE) ×1 IMPLANT
SUT VIC AB 2-0 CT1 27 (SUTURE) ×4
SUT VIC AB 2-0 CT1 TAPERPNT 27 (SUTURE) ×2 IMPLANT
SUT VIC AB 3-0 PS2 18 (SUTURE) ×2
SUT VIC AB 3-0 PS2 18XBRD (SUTURE) ×1 IMPLANT
SYR CONTROL 10ML LL (SYRINGE) ×4 IMPLANT
TOWEL OR 17X24 6PK STRL BLUE (TOWEL DISPOSABLE) ×2 IMPLANT
TOWEL OR 17X26 10 PK STRL BLUE (TOWEL DISPOSABLE) ×2 IMPLANT
TRAY FOLEY CATH 14FR (SET/KITS/TRAYS/PACK) IMPLANT
WATER STERILE IRR 1000ML POUR (IV SOLUTION) ×1 IMPLANT

## 2016-03-19 NOTE — Anesthesia Postprocedure Evaluation (Signed)
Anesthesia Post Note  Patient: [name]  Procedure(s) Performed: Procedure(s) (LRB): RIGHT TOTAL HIP ARTHROPLASTY ANTERIOR APPROACH (Right)  Patient location during evaluation: PACU Anesthesia Type: Spinal Level of consciousness: oriented and awake and alert Pain management: pain level controlled Vital Signs Assessment: post-procedure vital signs reviewed and stable Respiratory status: spontaneous breathing, respiratory function stable and patient connected to nasal cannula oxygen Cardiovascular status: blood pressure returned to baseline and stable Postop Assessment: no headache and no backache Anesthetic complications: no    Last Vitals:  Vitals:   03/19/16 1556 03/19/16 1600  BP: (!) 150/77 (!) 150/77  Pulse: 62 65  Resp: 12 17  Temp: (!) 35.8 C     Last Pain:  Vitals:   03/19/16 1556  TempSrc:   PainSc: 0-No pain    LLE Motor Response: Purposeful movement;Responds to commands (03/19/16 1556) LLE Sensation: Full sensation (03/19/16 1556) RLE Motor Response: Purposeful movement;Responds to commands (03/19/16 1556) RLE Sensation: Full sensation (03/19/16 1556) L Sensory Level: S1-Sole of foot, small toes (03/19/16 1556) R Sensory Level: S1-Sole of foot, small toes (03/19/16 1556)  Zenaida Deed

## 2016-03-19 NOTE — Op Note (Signed)
OPERATIVE REPORT    DATE OF PROCEDURE:  03/19/2016       PREOPERATIVE DIAGNOSIS:  PRIMARY OSTEOARTHRITIS OF THE RIGHT HIP M16.11                                                          POSTOPERATIVE DIAGNOSIS:  PRIMARY OSTEOARTHRITIS OF THE RIGHT HIP M16.11                                                           PROCEDURE: Anterior R total hip arthroplasty using a 54 mm DePuy Pinnacle  Cup, [employer], 0-degree polyethylene liner, a +1.5 32 mm ceramic head, a 1 Depuy Triloc stem   SURGEON: Jakell Trusty J    ASSISTANT:   Eric K. [employer]  (present throughout entire procedure and necessary for timely completion of the procedure)   ANESTHESIA: Spinal BLOOD LOSS: 300 FLUID REPLACEMENT: 1600 crystalloid Antibiotic: Ancef 2gm Tranexamic Acid: 1gm iv, 2gm topical COMPLICATIONS: none    INDICATIONS FOR PROCEDURE: A 65 y.o. year-old With  PRIMARY OSTEOARTHRITIS OF THE RIGHT HIP M16.11   for 3+ years, x-rays show bone-on-bone arthritic changes, and osteophytes. Despite conservative measures with observation, anti-inflammatory medicine, narcotics, use of a cane, has severe unremitting pain and can ambulate only a few blocks before resting. Patient desires elective R total hip arthroplasty to decrease pain and increase function. The risks, benefits, and alternatives were discussed at length including but not limited to the risks of infection, bleeding, nerve injury, stiffness, blood clots, the need for revision surgery, cardiopulmonary complications, among others, and they were willing to proceed. Questions answered     PROCEDURE IN DETAIL: The patient was identified by armband,  received preoperative IV antibiotics in the holding area at [facility], taken to the operating room , appropriate anesthetic monitors  were attached and  anesthesia was induced with the patienton the gurney. The HANA boots were applied to the feet and he was then transferred to the HANA  table with a peroneal post and support underneath the non-operative le, which was locked in 5 lb traction. Theoperative lower extremity was then prepped and draped in the usual sterile fashion from just above the iliac crest to the knee. And a timeout procedure was performed. We then made a 12 cm incision along the interval at the leading edge of the tensor fascia lata of starting at 2 cm lateral to and 2 cm distal to the ASIS. Small bleeders in the skin and subcutaneous tissue identified and cauterized we dissected down to the fascia and made an incision in the fascia allowing [place] to elevate the fascia of the tensor muscle and exploited the interval between the rectus and the tensor fascia lata. A Hohmann retractor was then placed along the superior neck of the femur and a Cobra retractor along the inferior neck of the femur we teed the capsule starting out at the superior anterior aspect of the acetabulum going distally and made the T along the neck both leaflets of the T were tagged with #2 Ethibond suture. Cobra retractors were then placed along the inferior and  superior neck allowing [place] to perform a standard neck cut and removed the femoral head with a power corkscrew. We then placed a right angle Hohmann retractor along the anterior aspect of the acetabulum a spiked Cobra in the cotyloid notch and posteriorly a Muelller retractor. We then sequentially reamed up to a 53 mm basket reamer obtaining good coverage in all quadrants, verified by C-arm imaging. Under C-arm control with and hammered into place a 54 mm Pinnacle cup in 45 of abduction and 15 of anteversion. The cup seated nicely and required no supplemental screws. We then placed a central hole Eliminator and a 0 polyethylene liner. The foot was then externally rotated to 110, the HANA elevator was placed around the flare of the greater trochanter and the limb was extended and abducted delivering the proximal femur up into the wound. A medium Hohmann  retractor was placed over the greater trochanter and a Mueller retractor along the posterior femoral neck completing the exposure. We then performed releases superiorly and and inferiorly of the capsule going back to the pirformis fossa superiorly and to the lesser trochanter inferiorly. We then entered the proximal femur with the box cutting offset chisel followed by, a canal sounder, the chili pepper and broaching up to a 1 broach. This seated nicely and we reamed the calcar. A trial reduction was performed with a 1.5 mm 32 mm head.The limb lengths were excellent the hip was stable in 90 of external rotation. At this point the trial components removed and we hammered into place a # 1 Tri-Lock stem with Gryption coating. This was a hi offset stem and a + 1.5 32 mm ceramic ball was then hammered into place the hip was reduced and final C-arm images obtained. The wound was thoroughly irrigated with normal saline solution. We repaired the ant capsule and the tensor fascia lot a with running 0 vicryl suture. the subcutaneous tissue was closed with 2-0 and 3-0 Vicryl suture followed by an Aquacil dressing. At this point the patient was awaken and transferred to hospital gurney without difficulty. The subcutaneous tissue with 0 and 2-0 undyed Vicryl suture and the skin with running  3-0 vicryl subcuticular suture. Aquacil dressing was applied. The patient was then unclamped, rolled supine, awaken extubated and taken to recovery room without difficulty in stable condition.   Jakhari Space J 03/19/2016, 2:37 PM

## 2016-03-19 NOTE — Anesthesia Preprocedure Evaluation (Addendum)
Anesthesia Evaluation  Patient identified by MRN, date of birth, ID band Patient awake    Reviewed: Allergy & Precautions, NPO status , Patient's Chart, lab work & pertinent test results  Airway Mallampati: II  TM Distance: >3 FB Neck ROM: Full    Dental  (+) Edentulous Upper, Edentulous Lower   Pulmonary    breath sounds clear to auscultation       Cardiovascular hypertension,  Rhythm:Regular Rate:Normal     Neuro/Psych    GI/Hepatic   Endo/Other    Renal/GU      Musculoskeletal   Abdominal   Peds  Hematology   Anesthesia Other Findings   Reproductive/Obstetrics                            Anesthesia Physical Anesthesia Plan  ASA: III  Anesthesia Plan: MAC and Spinal   Post-op Pain Management:    Induction: Intravenous  Airway Management Planned: Natural Airway and Nasal Cannula  Additional Equipment:   Intra-op Plan:   Post-operative Plan:   Informed Consent: I have reviewed the patients History and Physical, chart, labs and discussed the procedure including the risks, benefits and alternatives for the proposed anesthesia with the patient or authorized representative who has indicated his/her understanding and acceptance.     Plan Discussed with: CRNA and Anesthesiologist  Anesthesia Plan Comments:        Anesthesia Quick Evaluation

## 2016-03-19 NOTE — Interval H&P Note (Signed)
No interval change since H&P

## 2016-03-19 NOTE — Anesthesia Procedure Notes (Signed)
Spinal  Start time: 03/19/2016 1:10 PM End time: 03/19/2016 1:15 PM Staffing Performed: anesthesiologist  Preanesthetic Checklist Completed: patient identified, site marked, surgical consent, pre-op evaluation, timeout performed, IV checked, risks and benefits discussed and monitors and equipment checked Spinal Block Patient position: sitting Prep: Betadine Patient monitoring: heart rate, cardiac monitor, continuous pulse ox and blood pressure Approach: midline Location: L3-4 Injection technique: single-shot Needle Needle type: Quincke  Needle gauge: 22 G Needle length: 9 cm Needle insertion depth: 5 cm Assessment Sensory level: T6 Additional Notes 10 mg 0.75% Bupivacaine injected easily

## 2016-03-19 NOTE — Discharge Instructions (Signed)

## 2016-03-19 NOTE — Transfer of Care (Signed)
Immediate Anesthesia Transfer of Care Note  Patient: Angela Estrada  Procedure(s) Performed: Procedure(s): RIGHT TOTAL HIP ARTHROPLASTY ANTERIOR APPROACH (Right)  Patient Location: PACU  Anesthesia Type:Spinal  Level of Consciousness: awake and alert   Airway & Oxygen Therapy: Patient Spontanous Breathing and Patient connected to face mask oxygen  Post-op Assessment: Report given to RN, Post -op Vital signs reviewed and stable and Patient moving all extremities X 4  Post vital signs: Reviewed and stable  Last Vitals:  Vitals:   03/19/16 1043 03/19/16 1500  BP: (!) 153/75 116/68  Pulse: 67 73  Resp: 20 19  Temp: 36.7 C 36.1 C    Last Pain:  Vitals:   03/19/16 1500  TempSrc:   PainSc: (P) 0-No pain         Complications: No apparent anesthesia complications

## 2016-03-19 NOTE — Anesthesia Procedure Notes (Signed)
Procedure Name: MAC Date/Time: 03/19/2016 1:02 PM Performed by: Rejeana Brock L Pre-anesthesia Checklist: Patient identified, Emergency Drugs available, Suction available and Patient being monitored Patient Re-evaluated:Patient Re-evaluated prior to inductionOxygen Delivery Method: Nasal cannula

## 2016-03-20 ENCOUNTER — Encounter (HOSPITAL_COMMUNITY): Payer: Self-pay | Admitting: Orthopedic Surgery

## 2016-03-20 LAB — CBC
HCT: 34.8 % — ABNORMAL LOW (ref 36.0–46.0)
Hemoglobin: 11.6 g/dL — ABNORMAL LOW (ref 12.0–15.0)
MCH: 30.4 pg (ref 26.0–34.0)
MCHC: 33.3 g/dL (ref 30.0–36.0)
MCV: 91.3 fL (ref 78.0–100.0)
Platelets: 233 10*3/uL (ref 150–400)
RBC: 3.81 MIL/uL — ABNORMAL LOW (ref 3.87–5.11)
RDW: 13.5 % (ref 11.5–15.5)
WBC: 9 10*3/uL (ref 4.0–10.5)

## 2016-03-20 MED ORDER — INFLUENZA VAC SPLIT QUAD 0.5 ML IM SUSY
0.5000 mL | PREFILLED_SYRINGE | INTRAMUSCULAR | Status: AC
  Administered 2016-03-21: 0.5 mL via INTRAMUSCULAR

## 2016-03-20 MED ORDER — PNEUMOCOCCAL VAC POLYVALENT 25 MCG/0.5ML IJ INJ
0.5000 mL | INJECTION | INTRAMUSCULAR | Status: AC
  Administered 2016-03-21: 0.5 mL via INTRAMUSCULAR

## 2016-03-20 NOTE — Progress Notes (Signed)
Physical Therapy Treatment Patient Details Name: Angela Estrada MRN: [id number] DOB: 04-May-1951 Today's Date: 03/20/2016    History of Present Illness Pt is a 65 y.o. female now s/p Rt anterior THA. PMH: HTN, bilateral TKA.     PT Comments    Pt making progress with mobility, ambulating 60 ft with rw and working on step through pattern. Anticipate that the pt will continue to improve with her mobility and D/C to home with family support.   Follow Up Recommendations  Home health PT;Supervision for mobility/OOB     Equipment Recommendations  None recommended by PT    Recommendations for Other Services       Precautions / Restrictions Precautions Precautions: Fall Restrictions Weight Bearing Restrictions: Yes RLE Weight Bearing: Weight bearing as tolerated    Mobility  Bed Mobility Overal bed mobility: Needs Assistance Bed Mobility: Supine to Sit;Sit to Supine     Supine to sit: Min guard Sit to supine: Min assist   General bed mobility comments: Assist needed with Rt LE getting into bed.   Transfers Overall transfer level: Needs assistance Equipment used: Rolling walker (2 wheeled) Transfers: Sit to/from Stand Sit to Stand: Min guard         General transfer comment: cues for hand placement. Transfers performed from bed and BSC.   Ambulation/Gait Ambulation/Gait assistance: Min guard Ambulation Distance (Feet): 60 Feet Assistive device: Rolling walker (2 wheeled) Gait Pattern/deviations: Step-to pattern;Step-through pattern;Decreased weight shift to right Gait velocity: decreased   General Gait Details: Working on even strides.    Stairs            Wheelchair Mobility    Modified Rankin (Stroke Patients Only)       Balance Overall balance assessment: Needs assistance Sitting-balance support: No upper extremity supported Sitting balance-Leahy Scale: Good     Standing balance support: Bilateral upper extremity supported Standing  balance-Leahy Scale: Poor Standing balance comment: using rw                    Cognition Arousal/Alertness: Awake/alert Behavior During Therapy: WFL for tasks assessed/performed Overall Cognitive Status: Within Functional Limits for tasks assessed                      Exercises Total Joint Exercises Ankle Circles/Pumps: AROM;Both;10 reps Quad Sets: Strengthening;Right;10 reps Short Arc Quad: Strengthening;Right;10 reps Heel Slides: AAROM;Right;10 reps Hip ABduction/ADduction: Strengthening;Right;10 reps (min assist)    General Comments        Pertinent Vitals/Pain Pain Assessment: 0-10 Pain Score: 3  Pain Location: Rt hip/thigh Pain Descriptors / Indicators: Sore Pain Intervention(s): Limited activity within patient's tolerance;Monitored during session    Home Living       Prior Function         PT Goals (current goals can now be found in the care plan section) Acute Rehab PT Goals Patient Stated Goal: go home from the hospital PT Goal Formulation: With patient Time For Goal Achievement: 04/03/16 Potential to Achieve Goals: Good Progress towards PT goals: Progressing toward goals    Frequency    7X/week      PT Plan Current plan remains appropriate    Co-evaluation             End of Session Equipment Utilized During Treatment: Gait belt Activity Tolerance: Patient tolerated treatment well Patient left: in bed;with call bell/phone within reach;with family/visitor present     Time: 1450-1531 PT Time Calculation (min) (ACUTE ONLY): 41 min  Charges:  $Gait Training: 23-37 mins $Therapeutic Exercise: 8-22 mins                    G Codes:      Cassell Clement, PT, CSCS Pager [phone] Office [phone]  03/20/2016, 3:48 PM

## 2016-03-20 NOTE — Evaluation (Signed)
Occupational Therapy Evaluation Patient Details Name: Angela Estrada MRN: [id number] DOB: 1950-08-29 Today's Date: 03/20/2016    History of Present Illness Pt is a 65 y.o. female now s/p Rt anterior THA. PMH: HTN, bilateral TKA.    Clinical Impression   This 65 yo female admitted for above presents to acute OT with deficits below (see OT problem list) thus affecting her safety and independence with basic ADLs. She will benefit from one more session of OT to address shower stall transfer to shower seat.     Follow Up Recommendations  No OT follow up    Equipment Recommendations  None recommended by OT       Precautions / Restrictions Precautions Precautions: Fall Restrictions Weight Bearing Restrictions: No RLE Weight Bearing: Weight bearing as tolerated      Mobility Bed Mobility Overal bed mobility: Needs Assistance Bed Mobility: Sit to Supine       Sit to supine: Min assist   General bed mobility comments: in chair upon arrival  Transfers Overall transfer level: Needs assistance Equipment used: Rolling walker (2 wheeled) Transfers: Sit to/from Stand Sit to Stand: Min guard         General transfer comment: cues for sequence and hand position.     Balance Overall balance assessment: Needs assistance Sitting-balance support: No upper extremity supported;Feet supported Sitting balance-Leahy Scale: Good     Standing balance support: Bilateral upper extremity supported Standing balance-Leahy Scale: Poor Standing balance comment: reliant on RW                            ADL Overall ADL's : Needs assistance/impaired Eating/Feeding: Independent;Sitting   Grooming: Min guard;Wash/dry hands;Standing   Upper Body Bathing: Sitting;Set up   Lower Body Bathing: Moderate assistance (min guard A sit<>stand)   Upper Body Dressing : Set up;Sitting   Lower Body Dressing: Maximal assistance (min guard A sit<>stand)   Toilet Transfer: Minimal  assistance;Ambulation;RW;BSC (over toilet)   Toileting- Clothing Manipulation and Hygiene: Min guard;Sit to/from stand         General ADL Comments: Educated pt on most efficient sequence of getting dressed               Pertinent Vitals/Pain Pain Assessment: 0-10 Pain Score: 3  Pain Location: right hip/thigh Pain Descriptors / Indicators: Aching;Sore Pain Intervention(s): Monitored during session;Repositioned;Ice applied     Hand Dominance Right   Extremity/Trunk Assessment Upper Extremity Assessment Upper Extremity Assessment: Overall WFL for tasks assessed   Lower Extremity Assessment Lower Extremity Assessment: RLE deficits/detail RLE Deficits / Details: good quad activation       Communication Communication Communication: No difficulties   Cognition Arousal/Alertness: Lethargic;Suspect due to medications Behavior During Therapy: [facility] for tasks assessed/performed Overall Cognitive Status: Within Functional Limits for tasks assessed                                Home Living Family/patient expects to be discharged to:: Private residence Living Arrangements: Spouse/significant other Available Help at Discharge: Family;Available 24 hours/day Type of Home: House Home Access: Stairs to enter [employer] of Steps: 3 Entrance Stairs-Rails: Right;Left Home Layout: Two level;Able to live on main level with bedroom/bathroom     Bathroom Shower/Tub: Walk-in shower;Curtain   Bathroom Toilet: Standard     Home Equipment: Environmental consultant - 2 wheels;Bedside commode;Shower seat  Prior Functioning/Environment Level of Independence: Independent with assistive device(s)        Comments: using rw PTA        OT Problem List: Decreased strength;Decreased knowledge of use of DME or AE;Impaired balance (sitting and/or standing);Pain   OT Treatment/Interventions: Self-care/ADL training;Patient/family education;DME and/or AE  instruction;Balance training    OT Goals(Current goals can be found in the care plan section) Acute Rehab OT Goals Patient Stated Goal: go home from the hospital OT Goal Formulation: With patient Time For Goal Achievement: 03/27/16 Potential to Achieve Goals: Good  OT Frequency: Min 2X/week              End of Session Equipment Utilized During Treatment: Gait belt;Rolling walker  Activity Tolerance: Patient tolerated treatment well Patient left:     Time: [id number] OT Time Calculation (min): 31 min Charges:  OT General Charges $OT Visit: 1 Procedure OT Evaluation $OT Eval Moderate Complexity: 1 Procedure OT Treatments $Self Care/Home Management : 8-22 mins  Almon Register [id number] 03/20/2016, 1:41 PM

## 2016-03-20 NOTE — Evaluation (Signed)
Physical Therapy Evaluation Patient Details Name: Angela Estrada MRN: [id number] DOB: 1951/01/25 Today's Date: 03/20/2016   History of Present Illness  Pt is a 65 y.o. female now s/p Rt anterior THA. PMH: HTN, bilateral TKA.   Clinical Impression  Pt is s/p Rt anterior THA resulting in the deficits listed below (see PT Problem List). Pt able to ambulate 50 ft with rw and min guard assistance.  Pt will benefit from skilled PT to increase their independence and safety with mobility to allow discharge to home with spouse assistance.       Follow Up Recommendations Home health PT;Supervision for mobility/OOB    Equipment Recommendations  None recommended by PT    Recommendations for Other Services       Precautions / Restrictions Precautions Precautions: Fall Restrictions Weight Bearing Restrictions: Yes RLE Weight Bearing: Weight bearing as tolerated      Mobility  Bed Mobility               General bed mobility comments: in chair upon arrival  Transfers Overall transfer level: Needs assistance Equipment used: Rolling walker (2 wheeled) Transfers: Sit to/from Stand Sit to Stand: Min guard         General transfer comment: cues for sequence and hand position. Assist provided to stabilize rw.  Ambulation/Gait Ambulation/Gait assistance: Min guard Ambulation Distance (Feet): 50 Feet Assistive device: Rolling walker (2 wheeled) Gait Pattern/deviations: Step-to pattern;Decreased weight shift to right Gait velocity: slow pattern   General Gait Details: cues for gait sequence  Stairs            Wheelchair Mobility    Modified Rankin (Stroke Patients Only)       Balance Overall balance assessment: Needs assistance Sitting-balance support: No upper extremity supported Sitting balance-Leahy Scale: Good     Standing balance support: Bilateral upper extremity supported Standing balance-Leahy Scale: Poor Standing balance comment: using rw                             Pertinent Vitals/Pain Pain Assessment: 0-10 Pain Score: 3  Pain Location: Rt hip/thigh Pain Descriptors / Indicators: Aching Pain Intervention(s): Limited activity within patient's tolerance;Monitored during session    Home Living Family/patient expects to be discharged to:: Private residence Living Arrangements: Spouse/significant other Available Help at Discharge: Family;Available 24 hours/day Type of Home: House Home Access: Stairs to enter Entrance Stairs-Rails: Psychiatric nurse of Steps: 3 Home Layout: Two level;Able to live on main level with bedroom/bathroom Home Equipment: Gilford Rile - 2 wheels;Bedside commode;Shower seat      Prior Function Level of Independence: Independent with assistive device(s)         Comments: using rw PTA     Hand Dominance        Extremity/Trunk Assessment   Upper Extremity Assessment: Overall WFL for tasks assessed           Lower Extremity Assessment: RLE deficits/detail RLE Deficits / Details: good quad activation       Communication   Communication: No difficulties  Cognition Arousal/Alertness: Awake/alert Behavior During Therapy: WFL for tasks assessed/performed Overall Cognitive Status: Within Functional Limits for tasks assessed                      General Comments      Exercises     Assessment/Plan    PT Assessment Patient needs continued PT services  PT Problem List Decreased strength;Decreased range of motion;Decreased  activity tolerance;Decreased balance;Decreased mobility          PT Treatment Interventions DME instruction;Gait training;Stair training;Functional mobility training;Therapeutic activities;Therapeutic exercise;Balance training;Patient/family education    PT Goals (Current goals can be found in the Care Plan section)  Acute Rehab PT Goals Patient Stated Goal: go home from the hospital PT Goal Formulation: With patient Time For  Goal Achievement: 04/03/16 Potential to Achieve Goals: Good    Frequency 7X/week   Barriers to discharge        Co-evaluation               End of Session Equipment Utilized During Treatment: Gait belt Activity Tolerance: Patient tolerated treatment well Patient left: in chair;with call bell/phone within reach;with family/visitor present Nurse Communication: Mobility status;Weight bearing status         Time: [id number] PT Time Calculation (min) (ACUTE ONLY): 23 min   Charges:   PT Evaluation $PT Eval Moderate Complexity: 1 Procedure PT Treatments $Gait Training: 8-22 mins   PT G Codes:        Cassell Clement, PT, CSCS Pager [phone] Office [phone]  03/20/2016, 12:28 PM

## 2016-03-20 NOTE — Progress Notes (Signed)
PT Cancellation Note  Patient Details Name: Angela Estrada MRN: [id number] DOB: 30-Jan-1951   Cancelled Treatment:    Reason Eval/Treat Not Completed: Other (comment). Pt bathing with nursing assistance, will check back as able.    Cassell Clement, PT, CSCS Pager [phone] Office [phone]  03/20/2016, 10:06 AM

## 2016-03-20 NOTE — Progress Notes (Signed)
PATIENT ID: Angela Estrada  MRN: [id number]  DOB/AGE:  1951-01-01 / 65 y.o.  1 Day Post-Op Procedure(s) (LRB): RIGHT TOTAL HIP ARTHROPLASTY ANTERIOR APPROACH (Right)    PROGRESS NOTE Subjective: Patient is alert, oriented, no Nausea, no Vomiting, yes passing gas, . Taking PO well. Denies SOB, Chest or Calf Pain. Using Incentive Spirometer, PAS in place. Ambulate  WBAT, used Bedside commode Patient reports pain as  3/10  .    Objective: Vital signs in last 24 hours: Vitals:   03/19/16 1643 03/19/16 2039 03/20/16 0030 03/20/16 0631  BP: (!) 147/83 129/87 116/66 (!) 95/51  Pulse: 74 74 73 66  Resp: 16 16 16 16   Temp: 97.6 F (36.4 C) 98.3 F (36.8 C) 97.5 F (36.4 C) 98 F (36.7 C)  TempSrc: Oral Oral Oral Oral  SpO2: 100% 100% 100% 98%  Weight:      Height:          Intake/Output from previous day: I/O last 3 completed shifts: In: 1320 [P.O.:120; I.V.:1200] Out: 250 [Blood:250]   Intake/Output this shift: No intake/output data recorded.   LABORATORY DATA:  Recent Labs  03/20/16 0546  WBC 9.0  HGB 11.6*  HCT 34.8*  PLT 233    Examination: Neurologically intact ABD soft Neurovascular intact Sensation intact distally Intact pulses distally Dorsiflexion/Plantar flexion intact Incision: dressing C/D/I No cellulitis present Compartment soft} XR AP&Lat of hip shows well placed\fixed THA  Assessment:   1 Day Post-Op Procedure(s) (LRB): RIGHT TOTAL HIP ARTHROPLASTY ANTERIOR APPROACH (Right) ADDITIONAL DIAGNOSIS:  Expected Acute Blood Loss Anemia, Hypertension  Plan: PT/OT WBAT, THA  DVT Prophylaxis: SCDx72 hrs, ASA 325 mg BID x 2 weeks  DISCHARGE PLAN: Home  DISCHARGE NEEDS: HHPT, Walker and 3-in-1 comode seat

## 2016-03-21 LAB — CBC
HCT: 32.8 % — ABNORMAL LOW (ref 36.0–46.0)
Hemoglobin: 11.1 g/dL — ABNORMAL LOW (ref 12.0–15.0)
MCH: 30 pg (ref 26.0–34.0)
MCHC: 33.8 g/dL (ref 30.0–36.0)
MCV: 88.6 fL (ref 78.0–100.0)
Platelets: 221 10*3/uL (ref 150–400)
RBC: 3.7 MIL/uL — ABNORMAL LOW (ref 3.87–5.11)
RDW: 13.1 % (ref 11.5–15.5)
WBC: 12.5 10*3/uL — ABNORMAL HIGH (ref 4.0–10.5)

## 2016-03-21 NOTE — Progress Notes (Signed)
Discharge instructions given. Pt verbalized understanding and all questions were answered.  

## 2016-03-21 NOTE — Progress Notes (Signed)
PATIENT ID: Angela Estrada  MRN: [id number]  DOB/AGE:  [birth year] / 65 y.o.  2 Days Post-Op Procedure(s) (LRB): RIGHT TOTAL HIP ARTHROPLASTY ANTERIOR APPROACH (Right)    PROGRESS NOTE Subjective: Patient is alert, oriented, no Nausea, no Vomiting, yes passing gas, . Taking PO well with pt finishing breakfast. Denies SOB, Chest or Calf Pain. Using Incentive Spirometer, PAS in place. Ambulate WBAT with pt walking 60 ft with therapy Patient reports pain as  5/10  .    Objective: Vital signs in last 24 hours: Vitals:   03/20/16 0631 03/20/16 1500 03/20/16 1944 03/21/16 0553  BP: (!) 95/51 129/66 119/68 125/62  Pulse: 66 73 69 (!) 58  Resp: 16 16 16 16   Temp: 98 F (36.7 C) 98.1 F (36.7 C) 98.1 F (36.7 C) 97.8 F (36.6 C)  TempSrc: Oral Oral Oral Oral  SpO2: 98% 98% 98% 99%  Weight:      Height:          Intake/Output from previous day: I/O last 3 completed shifts: In: 2472.1 [P.O.:720; I.V.:1752.1] Out: -    Intake/Output this shift: No intake/output data recorded.   LABORATORY DATA:  Recent Labs  03/20/16 0546 03/21/16 0231  WBC 9.0 12.5*  HGB 11.6* 11.1*  HCT 34.8* 32.8*  PLT 233 221    Examination: Neurologically intact Neurovascular intact Sensation intact distally Intact pulses distally Dorsiflexion/Plantar flexion intact Incision: dressing C/D/I No cellulitis present Compartment soft} XR AP&Lat of hip shows well placed\fixed THA  Assessment:   2 Days Post-Op Procedure(s) (LRB): RIGHT TOTAL HIP ARTHROPLASTY ANTERIOR APPROACH (Right) ADDITIONAL DIAGNOSIS:  Expected Acute Blood Loss Anemia, Hypertension  Plan: PT/OT WBAT, THA  DVT Prophylaxis: SCDx72 hrs, ASA 325 mg BID x 2 weeks  DISCHARGE PLAN: Home, when pt passes therapy goals  DISCHARGE NEEDS: HHPT, Walker and 3-in-1 comode seat

## 2016-03-21 NOTE — Progress Notes (Signed)
Physical Therapy Treatment Patient Details Name: Angela Estrada MRN: [id number] DOB: August 02, 1950 Today's Date: 03/21/2016    History of Present Illness Pt is a 65 y.o. female now s/p Rt anterior THA. PMH: HTN, bilateral TKA.     PT Comments    Pt making good progress with PT, anticipate D/C home with family assistance. Pt able to ambulate 125 ft with rw and min guard assistance. Will check back to attempt stairs at next session.   Follow Up Recommendations  Home health PT;Supervision for mobility/OOB     Equipment Recommendations  None recommended by PT    Recommendations for Other Services       Precautions / Restrictions Precautions Precautions: Fall Restrictions Weight Bearing Restrictions: Yes RLE Weight Bearing: Weight bearing as tolerated    Mobility  Bed Mobility Overal bed mobility: Needs Assistance Bed Mobility: Supine to Sit     Supine to sit: Supervision     General bed mobility comments: supervision for safety  Transfers Overall transfer level: Needs assistance Equipment used: Rolling walker (2 wheeled) Transfers: Sit to/from Stand Sit to Stand: Min guard         General transfer comment: no cues needed, guard for safety  Ambulation/Gait Ambulation/Gait assistance: Min guard Ambulation Distance (Feet): 125 Feet Assistive device: Rolling walker (2 wheeled) Gait Pattern/deviations: Step-through pattern;Decreased weight shift to right Gait velocity: decreased   General Gait Details: working on even strides    Financial trader Rankin (Stroke Patients Only)       Balance Overall balance assessment: Needs assistance Sitting-balance support: No upper extremity supported Sitting balance-Leahy Scale: Good     Standing balance support: During functional activity Standing balance-Leahy Scale: Fair Standing balance comment: able to stand static without UE support                     Cognition Arousal/Alertness: Awake/alert Behavior During Therapy: WFL for tasks assessed/performed Overall Cognitive Status: Within Functional Limits for tasks assessed                      Exercises Total Joint Exercises Ankle Circles/Pumps: AROM;Both;10 reps Quad Sets: Strengthening;Right;10 reps Short Arc Quad: Strengthening;Right;10 reps Heel Slides: AAROM;Right;10 reps Hip ABduction/ADduction: Strengthening;Right;10 reps    General Comments        Pertinent Vitals/Pain Pain Assessment: 0-10 Pain Score: 4  Pain Location: Rt hip/thigh Pain Descriptors / Indicators: Sore Pain Intervention(s): Limited activity within patient's tolerance;Monitored during session    Home Living                      Prior Function            PT Goals (current goals can now be found in the care plan section) Acute Rehab PT Goals Patient Stated Goal: get home PT Goal Formulation: With patient Time For Goal Achievement: 04/03/16 Potential to Achieve Goals: Good Progress towards PT goals: Progressing toward goals    Frequency    7X/week      PT Plan Current plan remains appropriate    Co-evaluation             End of Session Equipment Utilized During Treatment: Gait belt Activity Tolerance: Patient tolerated treatment well Patient left: in chair;with call bell/phone within reach;with family/visitor present     Time: [id number] PT Time Calculation (min) (ACUTE ONLY): 29 min  Charges:  $  Gait Training: 8-22 mins $Therapeutic Exercise: 8-22 mins                    G Codes:      Cassell Clement, PT, CSCS Pager [phone] Office [phone]  03/21/2016, 8:40 AM

## 2016-03-21 NOTE — Care Management Note (Signed)
Case Management Note  Patient Details  Name: Angela Estrada MRN: [id number] Date of Birth: [birth year]  Subjective/Objective:   65 yr old female s/p right total hip arthroplasty.                 Action/Plan: Case manager spoke with patient concerning home health and DME needs at discharge. Patient states she will use [facility]. CM spoke with Santiago Glad, [facility] concerning referral. Patient has rolling walker and 3in1 at home, states she will have family support at discharge.    Expected Discharge Date:   03/21/16               Expected Discharge Plan:  [place]  In-House Referral:     Discharge planning Services  CM Consult  Post Acute Care Choice:  Home Health Choice offered to:  Patient  DME Arranged:  N/A DME Agency:  NA  HH Arranged:    [place] Agency:  [place]  Status of Service:  Completed, signed off  If discussed at [place] of Stay Meetings, dates discussed:    Additional Comments:  Ninfa Meeker, RN 03/21/2016, 12:12 PM

## 2016-03-21 NOTE — Discharge Summary (Signed)
Patient ID: Angela Estrada MRN: [id number] DOB/AGE: 1950/07/15 65 y.o.  Admit date: 03/19/2016 Discharge date: 03/21/2016  Admission Diagnoses:  Principal Problem:   Primary osteoarthritis of right hip Active Problems:   Arthritis of right hip   Discharge Diagnoses:  Same  Past Medical History:  Diagnosis Date  . Arthritis   . Arthritis   . HYPERCHOLESTEROLEMIA 04/25/2007   takes Pravastatin daily  . HYPERTENSION 01/03/2007   takes Losartan-HCTZ  and Amlodipine daily  . Hypokalemia    takes Potassium daily  . Joint pain   . Nocturia   . Pneumonia 2 yrs ago    Surgeries: Procedure(s): RIGHT TOTAL HIP ARTHROPLASTY ANTERIOR APPROACH on 03/19/2016   Consultants:   Discharged Condition: Improved  Hospital Course: Angela Estrada is an 65 y.o. female who was admitted 03/19/2016 for operative treatment ofPrimary osteoarthritis of right hip. Patient has severe unremitting pain that affects sleep, daily activities, and work/hobbies. After pre-op clearance the patient was taken to the operating room on 03/19/2016 and underwent  Procedure(s): RIGHT TOTAL HIP ARTHROPLASTY ANTERIOR APPROACH.    Patient was given perioperative antibiotics: Anti-infectives    Start     Dose/Rate Route Frequency Ordered Stop   03/19/16 0600  ceFAZolin (ANCEF) IVPB 2g/100 mL premix     2 g 200 mL/hr over 30 Minutes Intravenous On call to O.R. 03/18/16 1426 03/19/16 1315       Patient was given sequential compression devices, early ambulation, and chemoprophylaxis to prevent DVT.  Patient benefited maximally from hospital stay and there were no complications.    Recent vital signs: Patient Vitals for the past 24 hrs:  BP Temp Temp src Pulse Resp SpO2  03/21/16 0553 125/62 97.8 F (36.6 C) Oral (!) 58 16 99 %  03/20/16 1944 119/68 98.1 F (36.7 C) Oral 69 16 98 %  03/20/16 1500 129/66 98.1 F (36.7 C) Oral 73 16 98 %     Recent laboratory studies:  Recent Labs  03/20/16 0546  03/21/16 0231  WBC 9.0 12.5*  HGB 11.6* 11.1*  HCT 34.8* 32.8*  PLT 233 221     Discharge Medications:     Medication List    STOP taking these medications   HYDROcodone-acetaminophen 5-325 MG tablet Commonly known as:  NORCO/VICODIN     TAKE these medications   amLODipine 2.5 MG tablet Commonly known as:  NORVASC TAKE 1 TABLET BY MOUTH EVERY DAY What changed:  See the new instructions.   aspirin EC 325 MG tablet Take 1 tablet (325 mg total) by mouth 2 (two) times daily. What changed:  medication strength  how much to take  when to take this   HYDROmorphone 2 MG tablet Commonly known as:  DILAUDID Take 1 tablet (2 mg total) by mouth every 6 (six) hours as needed for severe pain.   KLOR-CON 10 10 MEQ tablet Generic drug:  potassium chloride TAKE 1 TABLET BY MOUTH EVERY DAY What changed:  See the new instructions.   losartan-hydrochlorothiazide 100-25 MG tablet Commonly known as:  HYZAAR TAKE 1 TABLET BY MOUTH EVERY DAY   methocarbamol 500 MG tablet Commonly known as:  ROBAXIN Take 1 tablet (500 mg total) by mouth 2 (two) times daily with a meal. What changed:  medication strength  how much to take  when to take this  reasons to take this   mometasone 50 MCG/ACT nasal spray Commonly known as:  NASONEX Place 2 sprays into the nose daily as needed. As needed for nasal  allergies What changed:  reasons to take this  additional instructions   multivitamin with minerals Tabs tablet Take 1 tablet by mouth daily.   pravastatin 20 MG tablet Commonly known as:  PRAVACHOL Take 1 tablet (20 mg total) by mouth daily.       Diagnostic Studies: Dg Chest 2 View  Result Date: 03/09/2016 CLINICAL DATA:  Preop right hip replacement.  Hypertension. EXAM: CHEST  2 VIEW COMPARISON:  04/07/2013, and 07/18/2011 FINDINGS: The heart size and mediastinal contours are within normal limits. Both lungs are clear. The visualized skeletal structures are unremarkable.  There is osteoarthritis of the right glenohumeral and left acromioclavicular joints. Minimal spurring also noted inferomedially of the left humeral head. IMPRESSION: No active cardiopulmonary disease. Electronically Signed   By: Ashley Royalty M.D.   On: 03/09/2016 11:49   Dg C-arm 1-60 Min  Result Date: 03/19/2016 CLINICAL DATA:  Status post right total hip arthroplasty. EXAM: DG C-ARM 61-120 MIN; OPERATIVE RIGHT HIP WITH PELVIS FLUOROSCOPY TIME:  19 seconds. COMPARISON:  None. FINDINGS: Two intraoperative fluoroscopic images demonstrate the patient be status post right total hip arthroplasty. The femoral and acetabular components appear to be well situated. No fracture or dislocation is noted. IMPRESSION: Status post right total hip arthroplasty. Electronically Signed   By: Marijo Conception, M.D.   On: 03/19/2016 14:46   Dg Hip Operative Unilat With Pelvis Right  Result Date: 03/19/2016 CLINICAL DATA:  Status post right total hip arthroplasty. EXAM: DG C-ARM 61-120 MIN; OPERATIVE RIGHT HIP WITH PELVIS FLUOROSCOPY TIME:  19 seconds. COMPARISON:  None. FINDINGS: Two intraoperative fluoroscopic images demonstrate the patient be status post right total hip arthroplasty. The femoral and acetabular components appear to be well situated. No fracture or dislocation is noted. IMPRESSION: Status post right total hip arthroplasty. Electronically Signed   By: Marijo Conception, M.D.   On: 03/19/2016 14:46    Disposition: 01-Home or Self Care  Discharge Instructions    Call MD / Call 911    Complete by:  As directed    If you experience chest pain or shortness of breath, CALL 911 and be transported to the hospital emergency room.  If you develope a fever above 101 F, pus (white drainage) or increased drainage or redness at the wound, or calf pain, call your surgeon's office.   Constipation Prevention    Complete by:  As directed    Drink plenty of fluids.  Prune juice may be helpful.  You may use a stool softener,  such as Colace (over the counter) 100 mg twice a day.  Use MiraLax (over the counter) for constipation as needed.   Diet - low sodium heart healthy    Complete by:  As directed    Driving restrictions    Complete by:  As directed    No driving for 2 weeks   Follow the hip precautions as taught in Physical Therapy    Complete by:  As directed    Increase activity slowly as tolerated    Complete by:  As directed    Patient may shower    Complete by:  As directed    You may shower without a dressing once there is no drainage.  Do not wash over the wound.  If drainage remains, cover wound with plastic wrap and then shower.      Follow-up Information    Kerin Salen, MD Follow up in 2 week(s).   Specialty:  Orthopedic Surgery Contact information: [phone]  [place] [address] [phone]            Signed: Hardin Negus, Melonee Gerstel R 03/21/2016, 8:11 AM

## 2016-03-21 NOTE — Progress Notes (Signed)
Physical Therapy Progress Note  Patient is making good progress with PT.  From a mobility standpoint anticipate patient will be ready for DC home with family support. Patient and spouse deny any questions or concerns.     03/21/16 1144  PT Visit Information  Last PT Received On 03/21/16  Assistance Needed +1  History of Present Illness Pt is a 65 y.o. female now s/p Rt anterior THA. PMH: HTN, bilateral TKA.   Subjective Data  Subjective Denies any questions.   Patient Stated Goal get home  Precautions  Precautions Fall  Restrictions  Weight Bearing Restrictions Yes  RLE Weight Bearing WBAT  Pain Assessment  Pain Assessment Faces  Faces Pain Scale 4  Pain Location Rt thigh  Pain Descriptors / Indicators Sore;Guarding  Pain Intervention(s) Monitored during session;Limited activity within patient's tolerance  Cognition  Arousal/Alertness Awake/alert  Behavior During Therapy WFL for tasks assessed/performed  Overall Cognitive Status Within Functional Limits for tasks assessed  Bed Mobility  General bed mobility comments pt in chair upon arrival  Transfers  Overall transfer level Needs assistance  Equipment used Rolling walker (2 wheeled)  Transfers Sit to/from Stand  Sit to Stand Min guard  General transfer comment good technique  Ambulation/Gait  Ambulation/Gait assistance Supervision  Ambulation Distance (Feet) 110 Feet  Assistive device Rolling walker (2 wheeled)  Gait Pattern/deviations Step-through pattern  General Gait Details working on even strides and weightbearing. Difficulty with Rt ankle dorsiflexion (pt reports this present PTA).   Gait velocity decreased  Stairs Yes  Stairs assistance Min guard  Stair Management One rail Right;Step to pattern;Sideways  Number of Stairs 4  General stair comments Pt and spouse report feeling confident to perform stairs at home  Balance  Overall balance assessment Needs assistance  Sitting-balance support No upper extremity  supported  Sitting balance-Leahy Scale Good  Standing balance support During functional activity  Standing balance-Leahy Scale Fair  PT - End of Session  Equipment Utilized During Treatment Gait belt  Activity Tolerance Patient tolerated treatment well  Patient left in chair;with call bell/phone within reach;with family/visitor present  Nurse Communication Mobility status;Weight bearing status  PT - Assessment/Plan  PT Plan Current plan remains appropriate  PT Frequency (ACUTE ONLY) 7X/week  Follow Up Recommendations Home health PT;Supervision for mobility/OOB  PT equipment None recommended by PT  PT Goal Progression  Progress towards PT goals Progressing toward goals  Acute Rehab PT Goals  PT Goal Formulation With patient  Time For Goal Achievement 04/03/16  Potential to Achieve Goals Good  PT Time Calculation  PT Start Time (ACUTE ONLY) 1126  PT Stop Time (ACUTE ONLY) 1139  PT Time Calculation (min) (ACUTE ONLY) 13 min  PT General Charges  $$ ACUTE PT VISIT 1 Procedure  PT Treatments  $Gait Training 8-22 mins  Cassell Clement, PT, CSCS Pager [phone] Office [phone]

## 2016-04-05 ENCOUNTER — Other Ambulatory Visit: Payer: Self-pay | Admitting: Endocrinology

## 2016-04-05 NOTE — Telephone Encounter (Signed)
Please refill x 1 Ov is due  

## 2016-04-24 ENCOUNTER — Other Ambulatory Visit: Payer: Self-pay | Admitting: Obstetrics and Gynecology

## 2016-04-24 DIAGNOSIS — Z1231 Encounter for screening mammogram for malignant neoplasm of breast: Secondary | ICD-10-CM

## 2016-05-17 ENCOUNTER — Ambulatory Visit (INDEPENDENT_AMBULATORY_CARE_PROVIDER_SITE_OTHER): Payer: BLUE CROSS/BLUE SHIELD | Admitting: Endocrinology

## 2016-05-17 ENCOUNTER — Encounter: Payer: Self-pay | Admitting: Endocrinology

## 2016-05-17 VITALS — BP 122/82 | HR 72 | Ht 63.0 in | Wt 202.0 lb

## 2016-05-17 DIAGNOSIS — R7309 Other abnormal glucose: Secondary | ICD-10-CM

## 2016-05-17 DIAGNOSIS — Z0000 Encounter for general adult medical examination without abnormal findings: Secondary | ICD-10-CM | POA: Diagnosis not present

## 2016-05-17 LAB — CBC WITH DIFFERENTIAL/PLATELET
Basophils Absolute: 0 10*3/uL (ref 0.0–0.1)
Basophils Relative: 0.4 % (ref 0.0–3.0)
Eosinophils Absolute: 0.1 10*3/uL (ref 0.0–0.7)
Eosinophils Relative: 1.9 % (ref 0.0–5.0)
HCT: 38.4 % (ref 36.0–46.0)
Hemoglobin: 13.1 g/dL (ref 12.0–15.0)
Lymphocytes Relative: 21.6 % (ref 12.0–46.0)
Lymphs Abs: 1.6 10*3/uL (ref 0.7–4.0)
MCHC: 34.2 g/dL (ref 30.0–36.0)
MCV: 88.4 fl (ref 78.0–100.0)
Monocytes Absolute: 0.5 10*3/uL (ref 0.1–1.0)
Monocytes Relative: 7 % (ref 3.0–12.0)
Neutro Abs: 5.2 10*3/uL (ref 1.4–7.7)
Neutrophils Relative %: 69.1 % (ref 43.0–77.0)
Platelets: 291 10*3/uL (ref 150.0–400.0)
RBC: 4.34 Mil/uL (ref 3.87–5.11)
RDW: 13.4 % (ref 11.5–15.5)
WBC: 7.5 10*3/uL (ref 4.0–10.5)

## 2016-05-17 LAB — HEMOGLOBIN A1C: Hgb A1c MFr Bld: 5.6 % (ref 4.6–6.5)

## 2016-05-17 LAB — HEPATIC FUNCTION PANEL
ALT: 16 U/L (ref 0–35)
AST: 21 U/L (ref 0–37)
Albumin: 4 g/dL (ref 3.5–5.2)
Alkaline Phosphatase: 69 U/L (ref 39–117)
Bilirubin, Direct: 0.1 mg/dL (ref 0.0–0.3)
Total Bilirubin: 0.5 mg/dL (ref 0.2–1.2)
Total Protein: 7.6 g/dL (ref 6.0–8.3)

## 2016-05-17 LAB — LIPID PANEL
Cholesterol: 185 mg/dL (ref 0–200)
HDL: 63.6 mg/dL (ref >39.00)
LDL Cholesterol: 104 mg/dL — ABNORMAL HIGH (ref 0–99)
NonHDL: 121.83
Total CHOL/HDL Ratio: 3
Triglycerides: 89 mg/dL (ref 0.0–149.0)
VLDL: 17.8 mg/dL (ref 0.0–40.0)

## 2016-05-17 LAB — BASIC METABOLIC PANEL
BUN: 13 mg/dL (ref 6–23)
CO2: 31 mEq/L (ref 19–32)
Calcium: 9.9 mg/dL (ref 8.4–10.5)
Chloride: 103 mEq/L (ref 96–112)
Creatinine, Ser: 0.66 mg/dL (ref 0.40–1.20)
GFR: 116.02 mL/min (ref >60.00)
Glucose, Bld: 108 mg/dL — ABNORMAL HIGH (ref 70–99)
Potassium: 4.1 mEq/L (ref 3.5–5.1)
Sodium: 140 mEq/L (ref 135–145)

## 2016-05-17 LAB — URINALYSIS, ROUTINE W REFLEX MICROSCOPIC
Bilirubin Urine: NEGATIVE
Hgb urine dipstick: NEGATIVE
Ketones, ur: NEGATIVE
Leukocytes, UA: NEGATIVE
Nitrite: NEGATIVE
Specific Gravity, Urine: 1.02 (ref 1.000–1.030)
Total Protein, Urine: NEGATIVE
Urine Glucose: NEGATIVE
Urobilinogen, UA: 0.2 (ref 0.0–1.0)
pH: 6 (ref 5.0–8.0)

## 2016-05-17 LAB — TSH: TSH: 1.5 u[IU]/mL (ref 0.35–4.50)

## 2016-05-17 NOTE — Progress Notes (Signed)
Subjective:    Patient ID: Angela Estrada, female    DOB: [birth year], 65 y.o.   MRN: [id number]  HPI Pt is here for regular wellness examination, and is feeling pretty well in general, and says chronic med probs are stable, except as noted below Her GYN does DEXA and mammography.   Past Medical History:  Diagnosis Date  . Arthritis   . Arthritis   . HYPERCHOLESTEROLEMIA 04/25/2007   takes Pravastatin daily  . HYPERTENSION 01/03/2007   takes Losartan-HCTZ  and Amlodipine daily  . Hypokalemia    takes Potassium daily  . Joint pain   . Nocturia   . Pneumonia 2 yrs ago    Past Surgical History:  Procedure Laterality Date  . CESAREAN SECTION  25 years ago  . CHOLECYSTECTOMY    . COLONOSCOPY    . DILATION AND CURETTAGE OF UTERUS  35 years ago  . INCISIONAL HERNIA REPAIR  02/21/2012   Procedure: LAPAROSCOPIC INCISIONAL HERNIA;  Surgeon: Harl Bowie, MD;  Location: [facility];  Service: General;  Laterality: N/A;  laparoscopic incisional hernia repair with mesh  . JOINT REPLACEMENT     bilateral knee replacements  . REPLACEMENT TOTAL KNEE BILATERAL  2010  . TOTAL HIP ARTHROPLASTY Right 03/19/2016   Procedure: RIGHT TOTAL HIP ARTHROPLASTY ANTERIOR APPROACH;  Surgeon: Frederik Pear, MD;  Location: [facility];  Service: Orthopedics;  Laterality: Right;  . TUBAL LIGATION      Social History   Social History  . Marital status: Married    Spouse name: N/A  . Number of children: N/A  . Years of education: N/A   Occupational History  . Beautician    Social History Main Topics  . Smoking status: Never Smoker  . Smokeless tobacco: Never Used  . Alcohol use No  . Drug use: No  . Sexual activity: Yes    Partners: Male    Birth control/ protection: Post-menopausal   Other Topics Concern  . Not on file   Social History Narrative  . No narrative on file    Current Outpatient Prescriptions on File Prior to Visit  Medication Sig Dispense Refill  . amLODipine (NORVASC) 2.5 MG  tablet TAKE 1 TABLET BY MOUTH EVERY DAY (Patient taking differently: Take 2.5 mg by mouth every day) 90 tablet 0  . aspirin EC 325 MG tablet Take 1 tablet (325 mg total) by mouth 2 (two) times daily. 30 tablet 0  . HYDROmorphone (DILAUDID) 2 MG tablet Take 1 tablet (2 mg total) by mouth every 6 (six) hours as needed for severe pain. 30 tablet 0  . KLOR-CON 10 10 MEQ tablet TAKE 1 TABLET BY MOUTH EVERY DAY (Patient taking differently: Take 10 MEQ by mouth every day) 30 tablet 11  . losartan-hydrochlorothiazide (HYZAAR) 100-25 MG tablet TAKE 1 TABLET BY MOUTH EVERY DAY 90 tablet 3  . methocarbamol (ROBAXIN) 500 MG tablet Take 1 tablet (500 mg total) by mouth 2 (two) times daily with a meal. 60 tablet 0  . mometasone (NASONEX) 50 MCG/ACT nasal spray Place 2 sprays into the nose daily as needed. As needed for nasal allergies (Patient taking differently: Place 2 sprays into the nose daily as needed (for allergies). ) 17 g 11  . Multiple Vitamin (MULTIVITAMIN WITH MINERALS) TABS Take 1 tablet by mouth daily.    . pravastatin (PRAVACHOL) 20 MG tablet TAKE 1 TABLET (20 MG TOTAL) BY MOUTH DAILY. 90 tablet 1   No current facility-administered medications on file prior to  visit.     Allergies  Allergen Reactions  . Lisinopril Cough  . Oxycodone Other (See Comments)    Hallucinations    Family History  Problem Relation Age of Onset  . Diabetes Mother   . Heart disease Mother   . Hypertension Mother   . Stroke Mother   . Diabetes Father   . Heart disease Father     Triple Bypass  . Hypertension Father   . Heart attack Brother   . Congestive Heart Failure Brother   . Hypertension Brother   . Ovarian cancer Sister 66  . Hypertension Sister   . Cancer Neg Hx     BP 122/82   Pulse 72   Ht 5\' 3"  (1.6 m)   Wt 202 lb (91.6 kg)   LMP 08/30/1998   SpO2 97%   BMI 35.78 kg/m    Review of Systems Constitutional: Negative for fever.  She has lost a few lbs, due to her efforts.  HENT: Negative  for hearing loss.   Eyes: Negative for visual disturbance.  Respiratory: Negative for shortness of breath.   Cardiovascular: Negative for chest pain.  Gastrointestinal: Negative for anal bleeding.  Endocrine: Negative for cold intolerance.  Genitourinary: Negative for hematuria.  Musculoskeletal: Negative for myalgias, but she has chronic right shoulder pain.  Skin: Negative for rash.  Allergic/Immunologic: Positive for environmental allergies.  Neurological: Negative for syncope, numbness and headaches.     Hematological: Bruises/bleeds easily.  Psychiatric/Behavioral: Negative for dysphoric mood.     Objective:   Physical Exam VS: see vs page GEN: no distress HEAD: head: no deformity eyes: no periorbital swelling, no proptosis external nose and ears are normal mouth: no lesion seen NECK: supple, thyroid is not enlarged CHEST WALL: no deformity LUNGS:  Clear to auscultation.   BREASTS: sees gyn.   CV: reg rate and rhythm, no murmur.  ABD: abdomen is soft, nontender.  no hepatosplenomegaly.  not distended.  no hernia GENITALIA/RECTAL: sees gyn.   MUSCULOSKELETAL: muscle bulk and strength are grossly normal.  no obvious joint swelling.  gait is steady with a walker. EXTEMITIES: no deformity.  no ulcer on the feet.  feet are of normal color and temp.  Trace bilat leg edema.  healing surgical scars at the ant right hip, and left foot.  PULSES: dorsalis pedis intact bilat.  no carotid bruit NEURO:  cn 2-12 grossly intact.   readily moves all 4's.  sensation is intact to touch on the feet SKIN:  Normal texture and temperature.  No rash or suspicious lesion is visible.   NODES:  None palpable at the neck.   PSYCH: alert, well-oriented.  Does not appear anxious nor depressed.  i personally reviewed electrocardiogram tracing (today): Indication: HTN Impression: 2 minor anbormalities     Assessment & Plan:  Wellness visit today, with problems stable, except as noted.  Patient is  advised the following: Patient Instructions  Please consider these measures for your health:  minimize alcohol.  Do not use tobacco products.  Have a colonoscopy at least every 10 years from age 89.  Women should have an annual mammogram from age 27.  Keep firearms safely stored.  Always use seat belts.  have working smoke alarms in your home.  See an eye doctor and dentist regularly.  Never drive under the influence of alcohol or drugs (including prescription drugs).    It is critically important to prevent falling down (keep floor areas well-lit, dry, and free of loose objects.  If you have a cane, walker, or wheelchair, you should use it, even for short trips around the house.  Wear flat-soled shoes.  Also, try not to rush). Please return in 1 year.

## 2016-05-17 NOTE — Progress Notes (Signed)
we discussed code status.  pt requests full code, but would not want to be started or maintained on artificial life-support measures if there was not a reasonable chance of recovery 

## 2016-05-17 NOTE — Patient Instructions (Addendum)
Please consider these measures for your health:  minimize alcohol.  Do not use tobacco products.  Have a colonoscopy at least every 10 years from age 63.  Women should have an annual mammogram from age 17.  Keep firearms safely stored.  Always use seat belts.  have working smoke alarms in your home.  See an eye doctor and dentist regularly.  Never drive under the influence of alcohol or drugs (including prescription drugs).    It is critically important to prevent falling down (keep floor areas well-lit, dry, and free of loose objects.  If you have a cane, walker, or wheelchair, you should use it, even for short trips around the house.  Wear flat-soled shoes.  Also, try not to rush). Please return in 1 year.

## 2016-05-18 LAB — HIV ANTIBODY (ROUTINE TESTING W REFLEX): HIV 1&2 Ab, 4th Generation: NONREACTIVE

## 2016-05-21 ENCOUNTER — Ambulatory Visit
Admission: RE | Admit: 2016-05-21 | Discharge: 2016-05-21 | Disposition: A | Discharge status: Home / Self Care | Payer: BLUE CROSS/BLUE SHIELD | Source: Ambulatory Visit | Attending: Obstetrics and Gynecology | Admitting: Obstetrics and Gynecology

## 2016-05-21 DIAGNOSIS — Z1231 Encounter for screening mammogram for malignant neoplasm of breast: Secondary | ICD-10-CM

## 2016-05-24 ENCOUNTER — Other Ambulatory Visit: Payer: Self-pay | Admitting: Endocrinology

## 2016-06-06 ENCOUNTER — Other Ambulatory Visit: Payer: Self-pay | Admitting: Endocrinology

## 2016-06-06 NOTE — Progress Notes (Signed)
65 y.o. G18P1001 Married [place] American female here for annual exam.    No vaginal bleeding or spotting.   Using a walker following right hip replacement.   Lost 20 pounds intentionally.   Does labs with PCP.   Left eye is red and itchy.   PCP:   Renato Shin, MD  Patient's last menstrual period was 08/30/1998 (approximate).           Sexually active: Yes.   female The current method of family planning is post menopausal status.    Exercising: No.  Not currently--recent hip replacement surgery 03/2016. Smoker:  no  Health Maintenance: Pap:  08-29-12 Neg:Neg HR HPV History of abnormal Pap:  Yes, 1990 had abnormal pap but normal on repeat--no colposcopy.  Paps normal since. MMG:  05-21-16 Density B/Neg/BiRads1:TBC Colonoscopy:  2011 normal [facility].  Next due 2021. BMD:  05-09-10 Result  Normal:TBC.  TDaP:  10-02-13 Gardasil:   N/A HIV:  NR 05/17/16 Hep C:  Declines. Screening Labs:  Hb today: PCP, Urine today:  neg.   reports that she has never smoked. She has never used smokeless tobacco. She reports that she does not drink alcohol or use drugs.  Past Medical History:  Diagnosis Date  . Arthritis   . Arthritis   . HYPERCHOLESTEROLEMIA 04/25/2007   takes Pravastatin daily  . HYPERTENSION 01/03/2007   takes Losartan-HCTZ  and Amlodipine daily  . Hypokalemia    takes Potassium daily  . Joint pain   . Nocturia   . Pneumonia 2 yrs ago    Past Surgical History:  Procedure Laterality Date  . CESAREAN SECTION  25 years ago  . CHOLECYSTECTOMY    . COLONOSCOPY    . DILATION AND CURETTAGE OF UTERUS  35 years ago  . INCISIONAL HERNIA REPAIR  02/21/2012   Procedure: LAPAROSCOPIC INCISIONAL HERNIA;  Surgeon: Harl Bowie, MD;  Location: [facility];  Service: General;  Laterality: N/A;  laparoscopic incisional hernia repair with mesh  . JOINT REPLACEMENT     bilateral knee replacements  . REPLACEMENT TOTAL KNEE BILATERAL  2010  . TOTAL HIP ARTHROPLASTY Right 03/19/2016   Procedure: RIGHT TOTAL HIP ARTHROPLASTY ANTERIOR APPROACH;  Surgeon: Frederik Pear, MD;  Location: [facility];  Service: Orthopedics;  Laterality: Right;  . TUBAL LIGATION      Current Outpatient Prescriptions  Medication Sig Dispense Refill  . amLODipine (NORVASC) 2.5 MG tablet TAKE 1 TABLET BY MOUTH EVERY DAY 90 tablet 0  . aspirin EC 325 MG tablet Take 1 tablet (325 mg total) by mouth 2 (two) times daily. 30 tablet 0  . KLOR-CON 10 10 MEQ tablet TAKE 1 TABLET BY MOUTH EVERY DAY (Patient taking differently: Take 10 MEQ by mouth every day) 30 tablet 11  . losartan-hydrochlorothiazide (HYZAAR) 100-25 MG tablet TAKE 1 TABLET BY MOUTH EVERY DAY 90 tablet 3  . methocarbamol (ROBAXIN) 500 MG tablet Take 1 tablet (500 mg total) by mouth 2 (two) times daily with a meal. 60 tablet 0  . mometasone (NASONEX) 50 MCG/ACT nasal spray Place 2 sprays into the nose daily as needed. As needed for nasal allergies (Patient taking differently: Place 2 sprays into the nose daily as needed (for allergies). ) 17 g 11  . Multiple Vitamin (MULTIVITAMIN WITH MINERALS) TABS Take 1 tablet by mouth daily.    . pravastatin (PRAVACHOL) 20 MG tablet TAKE 1 TABLET (20 MG TOTAL) BY MOUTH DAILY. 90 tablet 1   No current facility-administered medications for this visit.  Family History  Problem Relation Age of Onset  . Diabetes Mother   . Heart disease Mother   . Hypertension Mother   . Stroke Mother   . Diabetes Father   . Heart disease Father     Triple Bypass  . Hypertension Father   . Heart attack Brother   . Congestive Heart Failure Brother   . Hypertension Brother   . Ovarian cancer Sister 63  . Hypertension Sister   . Cancer Neg Hx     ROS:  Pertinent items are noted in HPI.  Otherwise, a comprehensive ROS was negative.  Exam:   BP (!) 144/78 (BP Location: Right Arm, Patient Position: Sitting, Cuff Size: Large)   Pulse 80   Resp 18   Ht 5\' 3"  (1.6 m)   Wt 204 lb (92.5 kg)   LMP 08/30/1998 (Approximate)    BMI 36.14 kg/m     General appearance: alert, cooperative and appears stated age Head: Normocephalic, let eye lip with swelling and erythema. Neck: no adenopathy, supple, symmetrical, trachea midline and thyroid normal to inspection and palpation Lungs: clear to auscultation bilaterally Breasts: normal appearance, no masses or tenderness, No nipple retraction or dimpling, No nipple discharge or bleeding, No axillary or supraclavicular adenopathy Heart: regular rate and rhythm Abdomen: soft, non-tender; no masses, no organomegaly Extremities: extremities normal, atraumatic, no cyanosis or edema Skin: Skin color, texture, turgor normal. No rashes or lesions Lymph nodes: Cervical, supraclavicular, and axillary nodes normal. No abnormal inguinal nodes palpated Neurologic: Grossly normal  Pelvic: External genitalia:  no lesions              Urethra:  normal appearing urethra with no masses, tenderness or lesions              Bartholins and Skenes: normal                 Vagina: normal appearing vagina with normal color and discharge, no lesions              Cervix: no lesions              Pap taken: Yes.   Bimanual Exam:  Uterus:  normal size, contour, position, consistency, mobility, non-tender              Adnexa: no mass, fullness, tenderness              Rectal exam: Yes.  .  Confirms.              Anus:  normal sphincter tone, no lesions  Chaperone was present for exam.  Assessment:   Well woman visit with normal exam. FH ovarian cancer in sister. Left eye.  Possible cellulitis of lid.  Plan: Mammogram screening discussed. Recommended self breast awareness. Pap and HR HPV as above. Guidelines for Calcium, Vitamin D, regular exercise program including cardiovascular and weight bearing exercise. BMD next year with mammogram.  Order placed for TBC. I recommended that the patient go to urgent care because her eye doctor's office is closed.  She will go to [facility].  Follow up  annually and prn.      After visit summary provided.

## 2016-06-08 ENCOUNTER — Ambulatory Visit (INDEPENDENT_AMBULATORY_CARE_PROVIDER_SITE_OTHER): Payer: BLUE CROSS/BLUE SHIELD | Admitting: Physician Assistant

## 2016-06-08 ENCOUNTER — Ambulatory Visit (INDEPENDENT_AMBULATORY_CARE_PROVIDER_SITE_OTHER): Payer: BLUE CROSS/BLUE SHIELD | Admitting: Obstetrics and Gynecology

## 2016-06-08 ENCOUNTER — Encounter: Payer: Self-pay | Admitting: Obstetrics and Gynecology

## 2016-06-08 VITALS — BP 144/78 | HR 80 | Resp 18 | Ht 63.0 in | Wt 204.0 lb

## 2016-06-08 VITALS — BP 162/90 | HR 78 | Temp 98.0°F | Resp 16 | Ht 63.5 in | Wt 204.0 lb

## 2016-06-08 DIAGNOSIS — Z01419 Encounter for gynecological examination (general) (routine) without abnormal findings: Secondary | ICD-10-CM

## 2016-06-08 DIAGNOSIS — Z0000 Encounter for general adult medical examination without abnormal findings: Secondary | ICD-10-CM

## 2016-06-08 DIAGNOSIS — Z780 Asymptomatic menopausal state: Secondary | ICD-10-CM

## 2016-06-08 DIAGNOSIS — H00014 Hordeolum externum left upper eyelid: Secondary | ICD-10-CM

## 2016-06-08 LAB — POCT URINALYSIS DIPSTICK
Bilirubin, UA: NEGATIVE
Blood, UA: NEGATIVE
Glucose, UA: NEGATIVE
Ketones, UA: NEGATIVE
Leukocytes, UA: NEGATIVE
Nitrite, UA: NEGATIVE
Protein, UA: NEGATIVE
Urobilinogen, UA: NEGATIVE
pH, UA: 5

## 2016-06-08 NOTE — Patient Instructions (Signed)

## 2016-06-08 NOTE — Progress Notes (Signed)
Patient ID: Angela Estrada, female     DOB: 1951-03-19, 65 y.o.    MRN: [id number]  PCP: Renato Shin, MD  Chief Complaint  Patient presents with  . Facial Swelling    Left eye swelling    Subjective:   This patient is new to this practice and presents for evaluation of LEFT eye swelling. She is accompanied by her husband.   LEFT eye began swelling 2-3 days ago. Painful. Itchy. Drainage looks "gold, brownish-like," in the morning when she first gets up. Clear "tears" during the day. Maybe a little bit of blurry vision on that side, like a "thin film." "Sinus allergies," but nothing worse than her baseline. No ear pain or sore throat.   Review of Systems As above  Prior to Admission medications   Medication Sig Start Date End Date Taking? Authorizing Provider  amLODipine (NORVASC) 2.5 MG tablet TAKE 1 TABLET BY MOUTH EVERY DAY 06/06/16  Yes Renato Shin, MD  aspirin EC 325 MG tablet Take 1 tablet (325 mg total) by mouth 2 (two) times daily. 03/19/16  Yes Eric K Phillips, PA-C  KLOR-CON 10 10 MEQ tablet TAKE 1 TABLET BY MOUTH EVERY DAY Patient taking differently: Take 10 MEQ by mouth every day 01/31/16  Yes Renato Shin, MD  losartan-hydrochlorothiazide (HYZAAR) 100-25 MG tablet TAKE 1 TABLET BY MOUTH EVERY DAY 03/04/16  Yes Renato Shin, MD  mometasone (NASONEX) 50 MCG/ACT nasal spray Place 2 sprays into the nose daily as needed. As needed for nasal allergies Patient taking differently: Place 2 sprays into the nose daily as needed (for allergies).  03/09/13  Yes Renato Shin, MD  Multiple Vitamin (MULTIVITAMIN WITH MINERALS) TABS Take 1 tablet by mouth daily.   Yes Historical Provider, MD  pravastatin (PRAVACHOL) 20 MG tablet TAKE 1 TABLET (20 MG TOTAL) BY MOUTH DAILY. 04/06/16  Yes Renato Shin, MD  methocarbamol (ROBAXIN) 500 MG tablet Take 1 tablet (500 mg total) by mouth 2 (two) times daily with a meal. Patient not taking: Reported on 06/08/2016 03/19/16   Leighton Parody, PA-C     Allergies  Allergen Reactions  . Lisinopril Cough  . Oxycodone Other (See Comments)    Hallucinations     Patient Active Problem List   Diagnosis Date Noted  . Arthritis of right hip 03/19/2016  . Primary osteoarthritis of right hip 03/18/2016  . Hypokalemia 03/29/2014  . Arthralgia 02/11/2014  . Routine general medical examination at a health care facility 03/28/2013  . Nonspecific abnormal findings on radiological and examination of lung field 03/27/2013  . Cough 03/11/2013  . Unspecified disorder of liver 03/09/2013  . Pyuria 03/09/2013  . Arthritis   . Ventral hernia 01/18/2012  . Encounter for long-term (current) use of other medications 07/05/2011  . HYPERGLYCEMIA 08/12/2008  . HYPERCHOLESTEROLEMIA 04/25/2007  . DIZZINESS AND GIDDINESS 04/25/2007  . Essential hypertension 01/03/2007  . ALLERGIC RHINITIS 01/03/2007     Family History  Problem Relation Age of Onset  . Diabetes Mother   . Heart disease Mother   . Hypertension Mother   . Stroke Mother   . Diabetes Father   . Heart disease Father     Triple Bypass  . Hypertension Father   . Heart attack Brother   . Congestive Heart Failure Brother   . Hypertension Brother   . Heart disease Brother   . Hypertension Sister   . Hypertension Sister   . Ovarian cancer Sister 72  . Hypertension Sister   .  Hypertension Sister   . Hypertension Sister   . Hypertension Sister   . Cancer Neg Hx      Social History   Social History  . Marital status: Married    Spouse name: N/A  . Number of children: 1  . Years of education: 12th grade +   Occupational History  . Beautician    Social History Main Topics  . Smoking status: Never Smoker  . Smokeless tobacco: Never Used  . Alcohol use No  . Drug use: No  . Sexual activity: Not Currently    Partners: Male    Birth control/ protection: Post-menopausal   Other Topics Concern  . Not on file   Social History Narrative   Lives with her  husband.   Adult daughter lives nearby.         Objective:  Physical Exam  Constitutional: She is oriented to person, place, and time. She appears well-developed and well-nourished. She is active and cooperative. No distress.  BP (!) 162/90   Pulse 78   Temp 98 F (36.7 C) (Oral)   Resp 16   Ht 5' 3.5" (1.613 m)   Wt 204 lb (92.5 kg)   LMP 08/30/1998 (Approximate)   SpO2 96%   BMI 35.57 kg/m    HENT:  Head: Normocephalic and atraumatic.  Eyes: Conjunctivae and EOM are normal. Pupils are equal, round, and reactive to light. Left eye exhibits hordeolum.  Fundoscopic exam:      The right eye shows no hemorrhage and no papilledema. The right eye shows red reflex.       The left eye shows no hemorrhage and no papilledema. The left eye shows red reflex.    Pulmonary/Chest: Effort normal.  Neurological: She is alert and oriented to person, place, and time.  Psychiatric: She has a normal mood and affect. Her speech is normal and behavior is normal.          Visual Acuity Screening   Right eye Left eye Both eyes  Without correction:     With correction: 20/25 20/30 20/20        Assessment & Plan:  1. Hordeolum externum of left upper eyelid Supportive care.  Anticipatory guidance.  RTC if symptoms worsen/persist. If symptoms worsen/persist, would refer to eye specialist.   Fara Chute, PA-C Physician Assistant-Certified [facility] [place]

## 2016-06-08 NOTE — Patient Instructions (Addendum)
     IF you received an x-ray today, you will receive an invoice from [facility]. Please contact [facility] at [phone] with questions or concerns regarding your invoice.   IF you received labwork today, you will receive an invoice from [facility]. Please contact [facility] at [phone] with questions or concerns regarding your invoice.   Our billing staff will not be able to assist you with questions regarding bills from these companies.  You will be contacted with the lab results as soon as they are available. The fastest way to get your results is to activate your My Chart account. Instructions are located on the last page of this paperwork. If you have not heard from us regarding the results in 2 weeks, please contact this office.     

## 2016-06-13 LAB — IPS PAP TEST WITH HPV

## 2016-08-27 ENCOUNTER — Other Ambulatory Visit: Payer: Self-pay | Admitting: Endocrinology

## 2016-09-06 ENCOUNTER — Encounter: Payer: Self-pay | Admitting: Gastroenterology

## 2016-11-23 ENCOUNTER — Other Ambulatory Visit: Payer: Self-pay | Admitting: Endocrinology

## 2016-11-24 ENCOUNTER — Other Ambulatory Visit: Payer: Self-pay | Admitting: Endocrinology

## 2017-01-17 ENCOUNTER — Encounter: Payer: Self-pay | Admitting: Gastroenterology

## 2017-02-27 ENCOUNTER — Other Ambulatory Visit: Payer: Self-pay | Admitting: Endocrinology

## 2017-03-03 ENCOUNTER — Other Ambulatory Visit: Payer: Self-pay | Admitting: Endocrinology

## 2017-03-06 ENCOUNTER — Ambulatory Visit (AMBULATORY_SURGERY_CENTER): Payer: Self-pay | Admitting: *Deleted

## 2017-03-06 VITALS — Ht 65.0 in | Wt 216.0 lb

## 2017-03-06 DIAGNOSIS — Z1211 Encounter for screening for malignant neoplasm of colon: Secondary | ICD-10-CM

## 2017-03-06 MED ORDER — NA SULFATE-K SULFATE-MG SULF 17.5-3.13-1.6 GM/177ML PO SOLN: ORAL | 0 refills | Status: DC

## 2017-03-06 NOTE — Progress Notes (Signed)
No allergies to eggs or soy. No problems with anesthesia.  Pt declined EMMI  No oxygen use  No diet drug use

## 2017-03-08 ENCOUNTER — Encounter: Payer: Self-pay | Admitting: Gastroenterology

## 2017-03-15 ENCOUNTER — Telehealth: Payer: Self-pay | Admitting: Gastroenterology

## 2017-03-15 NOTE — Telephone Encounter (Signed)
Called [facility] in [place] and gave them the no more than $50 coupon over the phone.  It did go through and I called patient to inform her to go pick up from pharmacy.    Suprep no more than 50  BIN [id number]   PCN [id number]  Group  [id number]  ID [id number]

## 2017-03-18 ENCOUNTER — Other Ambulatory Visit: Payer: Self-pay | Admitting: Endocrinology

## 2017-03-20 ENCOUNTER — Ambulatory Visit (AMBULATORY_SURGERY_CENTER): Payer: BLUE CROSS/BLUE SHIELD | Admitting: Gastroenterology

## 2017-03-20 ENCOUNTER — Encounter: Payer: Self-pay | Admitting: Gastroenterology

## 2017-03-20 VITALS — BP 134/81 | HR 67 | Temp 97.5°F | Resp 18 | Ht 63.03 in | Wt 204.0 lb

## 2017-03-20 DIAGNOSIS — Z1211 Encounter for screening for malignant neoplasm of colon: Secondary | ICD-10-CM

## 2017-03-20 DIAGNOSIS — Z1212 Encounter for screening for malignant neoplasm of rectum: Secondary | ICD-10-CM | POA: Diagnosis not present

## 2017-03-20 MED ORDER — SODIUM CHLORIDE 0.9 % IV SOLN: 500.0000 mL | INTRAVENOUS | Status: DC

## 2017-03-20 NOTE — Progress Notes (Signed)
Pt's states no medical or surgical changes since previsit or office visit. 

## 2017-03-20 NOTE — Op Note (Signed)
[facility] Patient Name: Angela Estrada Procedure Date: 03/20/2017 8:52 AM MRN: [id number] Endoscopist: [place]. Loletha Carrow , MD Age: 66 Referring MD:  Date of Birth: 1951-02-26 Gender: Female Account #: [id number] Procedure:                Colonoscopy Indications:              Screening for colorectal malignant neoplasm (no                            polyps on 2008 colonoscopy) Medicines:                Monitored Anesthesia Care Procedure:                Pre-Anesthesia Assessment:                           - Prior to the procedure, a History and Physical                            was performed, and patient medications and                            allergies were reviewed. The patient's tolerance of                            previous anesthesia was also reviewed. The risks                            and benefits of the procedure and the sedation                            options and risks were discussed with the patient.                            All questions were answered, and informed consent                            was obtained. Anticoagulants: The patient has taken                            aspirin. It was decided not to withhold this                            medication prior to the procedure. ASA Grade                            Assessment: II - A patient with mild systemic                            disease. After reviewing the risks and benefits,                            the patient was deemed in satisfactory condition to  undergo the procedure.                           After obtaining informed consent, the colonoscope                            was passed under direct vision. Throughout the                            procedure, the patient's blood pressure, pulse, and                            oxygen saturations were monitored continuously. The                            Colonoscope was introduced through the anus and                       advanced to the the cecum, identified by                            appendiceal orifice and ileocecal valve. The                            colonoscopy was performed without difficulty. The                            patient tolerated the procedure well. The quality                            of the bowel preparation was good. The ileocecal                            valve, appendiceal orifice, and rectum were                            photographed. The quality of the bowel preparation                            was evaluated using the BBPS [facility] Bowel                            Preparation Scale) with scores of: Right Colon = 2,                            Transverse Colon = 2 and Left Colon = 2. The total                            BBPS score equals 6. The bowel preparation used was                            SUPREP. Scope In: 9:01:18 AM Scope Out: 9:14:46 AM Scope Withdrawal Time: 0 hours 10 minutes 11 seconds  Total Procedure Duration: 0 hours 13 minutes 28 seconds  Findings:                 The perianal and digital rectal examinations were                            normal.                           Multiple medium-mouthed diverticula were found in                            the left colon.                           The exam was otherwise without abnormality on                            direct and retroflexion views. Complications:            No immediate complications. Estimated Blood Loss:     Estimated blood loss: none. Impression:               - Diverticulosis in the left colon.                           - The examination was otherwise normal on direct                            and retroflexion views.                           - No specimens collected. Recommendation:           - Patient has a contact number available for                            emergencies. The signs and symptoms of potential                            delayed complications were  discussed with the                            patient. Return to normal activities tomorrow.                            Written discharge instructions were provided to the                            patient.                           - Resume previous diet.                           - Continue present medications.                           - Repeat colonoscopy in 10 years for screening  purposes. Henry L. Loletha Carrow, MD 03/20/2017 9:23:05 AM This report has been signed electronically.

## 2017-03-20 NOTE — Progress Notes (Signed)
To PACU, VSS. Report to [email or web address] 

## 2017-03-20 NOTE — Patient Instructions (Signed)
YOU HAD AN ENDOSCOPIC PROCEDURE TODAY AT [facility]:   Refer to the procedure report that was given to you for any specific questions about what was found during the examination.  If the procedure report does not answer your questions, please call your gastroenterologist to clarify.  If you requested that your care partner not be given the details of your procedure findings, then the procedure report has been included in a sealed envelope for you to review at your convenience later.  YOU SHOULD EXPECT: Some feelings of bloating in the abdomen. Passage of more gas than usual.  Walking can help get rid of the air that was put into your GI tract during the procedure and reduce the bloating. If you had a lower endoscopy (such as a colonoscopy or flexible sigmoidoscopy) you may notice spotting of blood in your stool or on the toilet paper. If you underwent a bowel prep for your procedure, you may not have a normal bowel movement for a few days.  Please Note:  You might notice some irritation and congestion in your nose or some drainage.  This is from the oxygen used during your procedure.  There is no need for concern and it should clear up in a day or so.  SYMPTOMS TO REPORT IMMEDIATELY:   Following lower endoscopy (colonoscopy or flexible sigmoidoscopy):  Excessive amounts of blood in the stool  Significant tenderness or worsening of abdominal pains  Swelling of the abdomen that is new, acute  Fever of 100F or higher   For urgent or emergent issues, a gastroenterologist can be reached at any hour by calling [phone].   DIET:  We do recommend a small meal at first, but then you may proceed to your regular diet.  Drink plenty of fluids but you should avoid alcoholic beverages for 24 hours.  ACTIVITY:  You should plan to take it easy for the rest of today and you should NOT DRIVE or use heavy machinery until tomorrow (because of the sedation medicines used during the test).     FOLLOW UP: Our staff will call the number listed on your records the next business day following your procedure to check on you and address any questions or concerns that you may have regarding the information given to you following your procedure. If we do not reach you, we will leave a message.  However, if you are feeling well and you are not experiencing any problems, there is no need to return our call.  We will assume that you have returned to your regular daily activities without incident.  If any biopsies were taken you will be contacted by phone or by letter within the next 1-3 weeks.  Please call us at [phone] if you have not heard about the biopsies in 3 weeks.    SIGNATURES/CONFIDENTIALITY: You and/or your care partner have signed paperwork which will be entered into your electronic medical record.  These signatures attest to the fact that that the information above on your After Visit Summary has been reviewed and is understood.  Full responsibility of the confidentiality of this discharge information lies with you and/or your care-partner.  Diverticulosis and high fiber information given.  Recall colonoscopy 10 years-2028.

## 2017-03-21 ENCOUNTER — Telehealth: Payer: Self-pay | Admitting: *Deleted

## 2017-03-21 NOTE — Telephone Encounter (Signed)
  Follow up Call-  Call back number 03/20/2017  Post procedure Call Back phone  # [phone]  Permission to leave phone message Yes  Some recent data might be hidden     Patient questions:  Do you have a fever, pain , or abdominal swelling? No. Pain Score  0 *  Have you tolerated food without any problems? Yes.    Have you been able to return to your normal activities? Yes.    Do you have any questions about your discharge instructions: Diet   No. Medications  No. Follow up visit  No.  Do you have questions or concerns about your Care? No.  Actions: * If pain score is 4 or above: No action needed, pain <4.

## 2017-04-02 ENCOUNTER — Encounter: Payer: Self-pay | Admitting: Endocrinology

## 2017-04-02 ENCOUNTER — Ambulatory Visit (INDEPENDENT_AMBULATORY_CARE_PROVIDER_SITE_OTHER): Payer: BLUE CROSS/BLUE SHIELD

## 2017-04-02 DIAGNOSIS — Z23 Encounter for immunization: Secondary | ICD-10-CM | POA: Diagnosis not present

## 2017-05-15 ENCOUNTER — Other Ambulatory Visit: Payer: Self-pay

## 2017-05-15 MED ORDER — PRAVASTATIN SODIUM 20 MG PO TABS: 20.0000 mg | ORAL_TABLET | Freq: Every day | ORAL | 1 refills | Status: DC

## 2017-05-23 ENCOUNTER — Ambulatory Visit: Payer: BLUE CROSS/BLUE SHIELD | Admitting: Endocrinology

## 2017-06-20 ENCOUNTER — Other Ambulatory Visit: Payer: Self-pay | Admitting: Endocrinology

## 2017-06-21 ENCOUNTER — Other Ambulatory Visit: Payer: Self-pay

## 2017-06-21 ENCOUNTER — Telehealth: Payer: Self-pay | Admitting: Obstetrics and Gynecology

## 2017-06-21 NOTE — Telephone Encounter (Signed)
Left message on voicemail to call and reschedule cancelled appointment. Added to waitlist

## 2017-06-21 NOTE — Telephone Encounter (Signed)
Patient hasn't been seen here since 05/2016 ok to refill?

## 2017-06-22 NOTE — Telephone Encounter (Signed)
Please refill x 1 Ov is due  

## 2017-06-28 ENCOUNTER — Ambulatory Visit: Payer: BLUE CROSS/BLUE SHIELD | Admitting: Obstetrics and Gynecology

## 2017-07-02 ENCOUNTER — Other Ambulatory Visit: Payer: Self-pay

## 2017-07-02 MED ORDER — AMLODIPINE BESYLATE 2.5 MG PO TABS: 2.5000 mg | ORAL_TABLET | Freq: Every day | ORAL | 0 refills | Status: DC

## 2017-07-30 DIAGNOSIS — M25551 Pain in right hip: Secondary | ICD-10-CM | POA: Diagnosis not present

## 2017-08-06 ENCOUNTER — Other Ambulatory Visit (HOSPITAL_COMMUNITY): Payer: Self-pay | Admitting: Orthopedic Surgery

## 2017-08-06 DIAGNOSIS — M25559 Pain in unspecified hip: Secondary | ICD-10-CM

## 2017-08-16 ENCOUNTER — Encounter (HOSPITAL_COMMUNITY)
Admission: RE | Admit: 2017-08-16 | Discharge: 2017-08-16 | Disposition: A | Discharge status: Home / Self Care | Payer: Medicare HMO | Source: Ambulatory Visit | Attending: Orthopedic Surgery | Admitting: Orthopedic Surgery

## 2017-08-16 DIAGNOSIS — M25559 Pain in unspecified hip: Secondary | ICD-10-CM | POA: Insufficient documentation

## 2017-08-16 DIAGNOSIS — Z96641 Presence of right artificial hip joint: Secondary | ICD-10-CM | POA: Diagnosis not present

## 2017-08-16 DIAGNOSIS — Z471 Aftercare following joint replacement surgery: Secondary | ICD-10-CM | POA: Diagnosis not present

## 2017-08-16 MED ORDER — TECHNETIUM TC 99M MEDRONATE IV KIT
25.0000 | PACK | Freq: Once | INTRAVENOUS | Status: AC | PRN
  Administered 2017-08-16: 25 via INTRAVENOUS

## 2017-08-19 ENCOUNTER — Ambulatory Visit (INDEPENDENT_AMBULATORY_CARE_PROVIDER_SITE_OTHER): Payer: Medicare HMO | Admitting: Obstetrics and Gynecology

## 2017-08-19 ENCOUNTER — Encounter: Payer: Self-pay | Admitting: Obstetrics and Gynecology

## 2017-08-19 ENCOUNTER — Other Ambulatory Visit: Payer: Self-pay

## 2017-08-19 VITALS — BP 138/80 | HR 76 | Resp 16 | Ht 62.0 in | Wt 208.0 lb

## 2017-08-19 DIAGNOSIS — Z01419 Encounter for gynecological examination (general) (routine) without abnormal findings: Secondary | ICD-10-CM

## 2017-08-19 NOTE — Progress Notes (Signed)
67 y.o. G26P1001 Married [place] American female here for annual exam.    Denies vaginal bleeding or spotting.  Urinary frequency when she drinks tea.   May need a right rotator cuff surgery.  Difficulty moving her right arm at this time.   FH ovarian cancer in sister who had surgery only.  Was early stage.  Labs with PCP.   PCP: Dr. Loanne Drilling    Patient's last menstrual period was 08/30/1998 (approximate).           Sexually active: Yes.    The current method of family planning is post menopausal status.    Exercising: Yes.    water exercise Smoker:  no  Health Maintenance: Pap: 06/08/16 Pap and HR HPV negative History of abnormal Pap:  Yes, 1990 had abnormal pap but normal on repeat--no colposcopy. Paps normal since. MMG:  05/21/16 BIRADS 1 negative/density b.    Colonoscopy:  03/20/17 Normal repeat 10 years BMD:   05/09/10 - Normal at [facility]. TDaP:  10/02/13 Gardasil:   n/a HIV: NR 05/27/16 Hep C: 03/10/13 Negative Screening Labs:  PCP   reports that  has never smoked. she has never used smokeless tobacco. She reports that she does not drink alcohol or use drugs.  Past Medical History:  Diagnosis Date  . Allergy   . Arthritis   . Arthritis   . HYPERCHOLESTEROLEMIA 04/25/2007   takes Pravastatin daily  . HYPERTENSION 01/03/2007   takes Losartan-HCTZ  and Amlodipine daily  . Hypokalemia    takes Potassium daily  . Joint pain   . Nocturia   . Pneumonia 2 yrs ago    Past Surgical History:  Procedure Laterality Date  . CESAREAN SECTION  25 years ago  . CHOLECYSTECTOMY    . COLONOSCOPY    . DILATION AND CURETTAGE OF UTERUS  35 years ago  . HERNIA REPAIR    . INCISIONAL HERNIA REPAIR  02/21/2012   Procedure: LAPAROSCOPIC INCISIONAL HERNIA;  Surgeon: Harl Bowie, MD;  Location: [facility];  Service: General;  Laterality: N/A;  laparoscopic incisional hernia repair with mesh  . JOINT REPLACEMENT     bilateral knee replacements  . REPLACEMENT TOTAL KNEE  BILATERAL  2010  . TOTAL HIP ARTHROPLASTY Right 03/19/2016   Procedure: RIGHT TOTAL HIP ARTHROPLASTY ANTERIOR APPROACH;  Surgeon: Frederik Pear, MD;  Location: [facility];  Service: Orthopedics;  Laterality: Right;  . TUBAL LIGATION      Current Outpatient Medications  Medication Sig Dispense Refill  . amLODipine (NORVASC) 2.5 MG tablet Take 1 tablet (2.5 mg total) by mouth daily. 90 tablet 0  . aspirin EC 325 MG tablet Take 1 tablet (325 mg total) by mouth 2 (two) times daily. 30 tablet 0  . diclofenac sodium (VOLTAREN) 1 % GEL APPLY 2 GRAM(S) 2 TO 3 TIMES A DAY. AS NEEDED  1  . KLOR-CON 10 10 MEQ tablet TAKE 1 TABLET BY MOUTH EVERY DAY 30 tablet 11  . losartan-hydrochlorothiazide (HYZAAR) 100-25 MG tablet TAKE 1 TABLET BY MOUTH EVERY DAY 90 tablet 3  . Multiple Vitamin (MULTIVITAMIN WITH MINERALS) TABS Take 1 tablet by mouth daily.    . pravastatin (PRAVACHOL) 20 MG tablet Take 1 tablet (20 mg total) by mouth daily. 90 tablet 1  . mometasone (NASONEX) 50 MCG/ACT nasal spray Place 2 sprays into the nose daily as needed. As needed for nasal allergies (Patient not taking: Reported on 08/19/2017) 17 g 11   No current facility-administered medications for this visit.  Family History  Problem Relation Age of Onset  . Diabetes Mother   . Heart disease Mother   . Hypertension Mother   . Stroke Mother   . Diabetes Father   . Heart disease Father        Triple Bypass  . Hypertension Father   . Heart attack Brother   . Congestive Heart Failure Brother   . Hypertension Brother   . Heart disease Brother   . Hypertension Sister   . Hypertension Sister   . Ovarian cancer Sister 102  . Hypertension Sister   . Hypertension Sister   . Hypertension Sister   . Hypertension Sister   . Cancer Neg Hx   . Colon cancer Neg Hx     ROS:  Pertinent items are noted in HPI.  Otherwise, a comprehensive ROS was negative.  Exam:   BP 138/80 (BP Location: Right Arm, Patient Position: Sitting, Cuff Size:  Large)   Pulse 76   Resp 16   Ht 5\' 2"  (1.575 m)   Wt 208 lb (94.3 kg)   LMP 08/30/1998 (Approximate)   BMI 38.04 kg/m     General appearance: alert, cooperative and appears stated age Head: Normocephalic, without obvious abnormality, atraumatic Neck: no adenopathy, supple, symmetrical, trachea midline and thyroid normal to inspection and palpation Lungs: clear to auscultation bilaterally Breasts: normal appearance, no masses or tenderness, No nipple retraction or dimpling, No nipple discharge or bleeding, No axillary or supraclavicular adenopathy Heart: regular rate and rhythm Abdomen: soft, non-tender; no masses, no organomegaly Extremities: extremities normal, atraumatic, no cyanosis or edema Skin: Skin color, texture, turgor normal. No rashes or lesions Lymph nodes: Cervical, supraclavicular, and axillary nodes normal. No abnormal inguinal nodes palpated Neurologic: Grossly normal  Pelvic: External genitalia:  no lesions              Urethra:  normal appearing urethra with no masses, tenderness or lesions              Bartholins and Skenes: normal                 Vagina: normal appearing vagina with normal color and discharge, no lesions              Cervix: no lesions              Pap taken: No. Bimanual Exam:  Uterus:  normal size, contour, position, consistency, mobility, non-tender              Adnexa: no mass, fullness, tenderness              Rectal exam: Yes.  .  Confirms.              Anus:  normal sphincter tone, no lesions  Chaperone was present for exam.  Assessment:   Well woman visit with normal exam. FH ovarian cancer.  Plan: Mammogram screening discussed.  States she will schedule.  Recommended self breast awareness. Pap and HR HPV as above. Guidelines for Calcium, Vitamin D, regular exercise program including cardiovascular and weight bearing exercise. We reviewed signs and symptoms of ovarian cancer.  No pelvic US at this time.  Follow up annually and  prn.   After visit summary provided.

## 2017-08-19 NOTE — Patient Instructions (Signed)

## 2017-08-21 ENCOUNTER — Ambulatory Visit (INDEPENDENT_AMBULATORY_CARE_PROVIDER_SITE_OTHER): Payer: Medicare HMO

## 2017-08-21 ENCOUNTER — Encounter: Payer: Self-pay | Admitting: Podiatry

## 2017-08-21 ENCOUNTER — Ambulatory Visit (INDEPENDENT_AMBULATORY_CARE_PROVIDER_SITE_OTHER): Payer: Medicare HMO | Admitting: Podiatry

## 2017-08-21 ENCOUNTER — Other Ambulatory Visit: Payer: Self-pay | Admitting: Podiatry

## 2017-08-21 VITALS — BP 148/76 | HR 92 | Resp 16 | Ht 62.0 in | Wt 208.0 lb

## 2017-08-21 DIAGNOSIS — M25572 Pain in left ankle and joints of left foot: Secondary | ICD-10-CM | POA: Diagnosis not present

## 2017-08-21 DIAGNOSIS — M779 Enthesopathy, unspecified: Secondary | ICD-10-CM

## 2017-08-21 DIAGNOSIS — M76822 Posterior tibial tendinitis, left leg: Secondary | ICD-10-CM | POA: Diagnosis not present

## 2017-08-21 MED ORDER — TRIAMCINOLONE ACETONIDE 10 MG/ML IJ SUSP
10.0000 mg | Freq: Once | INTRAMUSCULAR | Status: AC
  Administered 2017-08-21: 10 mg

## 2017-08-21 NOTE — Progress Notes (Signed)
   Subjective:    Patient ID: Angela Estrada, female    DOB: 1951-01-18, 67 y.o.   MRN: [id number]  HPI    Review of Systems  Musculoskeletal: Positive for arthralgias and gait problem.  Neurological: Positive for weakness.  Hematological: Bruises/bleeds easily.  All other systems reviewed and are negative.      Objective:   Physical Exam        Assessment & Plan:

## 2017-08-21 NOTE — Progress Notes (Signed)
Subjective:   Patient ID: Angela Estrada, female   DOB: 67 y.o.   MRN: [id number]   HPI Patient presents stating she has had severe collapse of her left arch and she has a lot of pain in her ankle.  States is been getting worse over the last year and it reached a point where she cannot walk and she has had knee surgery and hip surgery.  Patient does not smoke and likes to be active   Review of Systems  All other systems reviewed and are negative.       Objective:  Physical Exam  Constitutional: She appears well-developed and well-nourished.  Cardiovascular: Intact distal pulses.  Pulmonary/Chest: Effort normal.  Musculoskeletal: Normal range of motion.  Neurological: She is alert.  Skin: Skin is warm.  Nursing note and vitals reviewed.   Neurovascular status intact muscle strength adequate range of motion was adequate with patient found to have complete collapse of the medial longitudinal arch left with inflammation and pain.  Patient has exquisite discomfort in the sinus tarsi left secondary to collapsed medial longitudinal arch and has had previous surgery on her foot which is done good but is not a part of the problem she is experiencing at this time.  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  Patient cannot be ambulatory due to the pain she is in and she does feel unstable and at this point after reviewing x-rays I have recommended AFO type bracing and I am sending her to ped orthotist for evaluation and treatment.  I then went ahead and I injected the capsule of the sinus tarsi left 3 mg Kenalog 5 mg Xylocaine to reduce the inflammation and will see patient back after the bracing is  accomplished  X-ray indicates complete collapse of medial longitudinal arch left with history of stable fixation of the first metatarsal from previous bunion surgery

## 2017-08-22 DIAGNOSIS — M25551 Pain in right hip: Secondary | ICD-10-CM | POA: Diagnosis not present

## 2017-08-26 ENCOUNTER — Other Ambulatory Visit: Payer: Self-pay | Admitting: Orthopedic Surgery

## 2017-08-27 ENCOUNTER — Other Ambulatory Visit: Payer: Self-pay | Admitting: Orthopedic Surgery

## 2017-08-27 ENCOUNTER — Ambulatory Visit: Payer: Medicare HMO | Admitting: Orthotics

## 2017-08-27 DIAGNOSIS — M25572 Pain in left ankle and joints of left foot: Secondary | ICD-10-CM

## 2017-08-27 DIAGNOSIS — M76822 Posterior tibial tendinitis, left leg: Secondary | ICD-10-CM

## 2017-08-27 DIAGNOSIS — M779 Enthesopathy, unspecified: Secondary | ICD-10-CM

## 2017-08-27 NOTE — Progress Notes (Signed)
Patient presents today for evaluation/casting for AFO brace (L).   Patient has hx of the following conditions: Gait instability,  Ankle instabilty,  Gait analysis done and patient displays abnormality of gait in both sagittial and frontal planes, and could benefit in aggressive ankle support.  Patient chose [place] brace w/ lace/speed laces.  

## 2017-09-02 ENCOUNTER — Telehealth: Payer: Self-pay

## 2017-09-02 ENCOUNTER — Other Ambulatory Visit: Payer: Self-pay

## 2017-09-02 MED ORDER — OLMESARTAN MEDOXOMIL-HCTZ 40-25 MG PO TABS: 1.0000 | ORAL_TABLET | Freq: Every day | ORAL | 0 refills | Status: DC

## 2017-09-02 NOTE — Addendum Note (Signed)
Addended by: Renato Shin on: 09/02/2017 02:11 PM   Modules accepted: Orders

## 2017-09-02 NOTE — Telephone Encounter (Signed)
I changed and sent rx x 1 Ov is due

## 2017-09-02 NOTE — Telephone Encounter (Signed)
Patient called today because she was told by [facility] that they could not fill her Hyzaar because the manufacturer is out of stock and does not know when they will get any in can you please prescribe something to take its place

## 2017-09-03 NOTE — Telephone Encounter (Signed)
Patient stated the new prescription that was ordered is too expensive, and she wants to know if something else can be called in- I scheduled her for a f/u on 09/10/17- please advise

## 2017-09-03 NOTE — Telephone Encounter (Signed)
Let's discuss at next ov.

## 2017-09-04 NOTE — Telephone Encounter (Signed)
I called patient & stated that medication changes would be discussed on her visit 4/2.

## 2017-09-10 ENCOUNTER — Encounter: Payer: Self-pay | Admitting: Endocrinology

## 2017-09-10 ENCOUNTER — Ambulatory Visit (INDEPENDENT_AMBULATORY_CARE_PROVIDER_SITE_OTHER): Payer: Medicare HMO | Admitting: Endocrinology

## 2017-09-10 VITALS — BP 160/90 | HR 81 | Wt 217.2 lb

## 2017-09-10 DIAGNOSIS — R7309 Other abnormal glucose: Secondary | ICD-10-CM

## 2017-09-10 DIAGNOSIS — Z0001 Encounter for general adult medical examination with abnormal findings: Secondary | ICD-10-CM | POA: Diagnosis not present

## 2017-09-10 DIAGNOSIS — I10 Essential (primary) hypertension: Secondary | ICD-10-CM

## 2017-09-10 DIAGNOSIS — E7800 Pure hypercholesterolemia, unspecified: Secondary | ICD-10-CM | POA: Diagnosis not present

## 2017-09-10 DIAGNOSIS — Z0000 Encounter for general adult medical examination without abnormal findings: Secondary | ICD-10-CM

## 2017-09-10 LAB — LIPID PANEL
Cholesterol: 168 mg/dL (ref 0–200)
HDL: 54.9 mg/dL (ref >39.00)
LDL Cholesterol: 94 mg/dL (ref 0–99)
NonHDL: 112.75
Total CHOL/HDL Ratio: 3
Triglycerides: 93 mg/dL (ref 0.0–149.0)
VLDL: 18.6 mg/dL (ref 0.0–40.0)

## 2017-09-10 LAB — URINALYSIS, ROUTINE W REFLEX MICROSCOPIC
Bilirubin Urine: NEGATIVE
Hgb urine dipstick: NEGATIVE
Ketones, ur: NEGATIVE
Leukocytes, UA: NEGATIVE
Nitrite: NEGATIVE
RBC / HPF: NONE SEEN (ref 0–?)
Specific Gravity, Urine: 1.02 (ref 1.000–1.030)
Total Protein, Urine: NEGATIVE
Urine Glucose: NEGATIVE
Urobilinogen, UA: 0.2 (ref 0.0–1.0)
pH: 5.5 (ref 5.0–8.0)

## 2017-09-10 LAB — HEPATIC FUNCTION PANEL
ALT: 17 U/L (ref 0–35)
AST: 21 U/L (ref 0–37)
Albumin: 3.9 g/dL (ref 3.5–5.2)
Alkaline Phosphatase: 65 U/L (ref 39–117)
Bilirubin, Direct: 0.1 mg/dL (ref 0.0–0.3)
Total Bilirubin: 0.6 mg/dL (ref 0.2–1.2)
Total Protein: 7.4 g/dL (ref 6.0–8.3)

## 2017-09-10 LAB — CBC WITH DIFFERENTIAL/PLATELET
Basophils Absolute: 0 10*3/uL (ref 0.0–0.1)
Basophils Relative: 0.6 % (ref 0.0–3.0)
Eosinophils Absolute: 0.1 10*3/uL (ref 0.0–0.7)
Eosinophils Relative: 2 % (ref 0.0–5.0)
HCT: 40.5 % (ref 36.0–46.0)
Hemoglobin: 13.9 g/dL (ref 12.0–15.0)
Lymphocytes Relative: 23.2 % (ref 12.0–46.0)
Lymphs Abs: 1.7 10*3/uL (ref 0.7–4.0)
MCHC: 34.3 g/dL (ref 30.0–36.0)
MCV: 89.7 fl (ref 78.0–100.0)
Monocytes Absolute: 0.5 10*3/uL (ref 0.1–1.0)
Monocytes Relative: 7 % (ref 3.0–12.0)
Neutro Abs: 4.9 10*3/uL (ref 1.4–7.7)
Neutrophils Relative %: 67.2 % (ref 43.0–77.0)
Platelets: 259 10*3/uL (ref 150.0–400.0)
RBC: 4.51 Mil/uL (ref 3.87–5.11)
RDW: 13.4 % (ref 11.5–15.5)
WBC: 7.2 10*3/uL (ref 4.0–10.5)

## 2017-09-10 LAB — BASIC METABOLIC PANEL
BUN: 13 mg/dL (ref 6–23)
CO2: 27 mEq/L (ref 19–32)
Calcium: 9.3 mg/dL (ref 8.4–10.5)
Chloride: 103 mEq/L (ref 96–112)
Creatinine, Ser: 0.55 mg/dL (ref 0.40–1.20)
GFR: 142.6 mL/min (ref >60.00)
Glucose, Bld: 88 mg/dL (ref 70–99)
Potassium: 3.7 mEq/L (ref 3.5–5.1)
Sodium: 137 mEq/L (ref 135–145)

## 2017-09-10 LAB — HEMOGLOBIN A1C: Hgb A1c MFr Bld: 5.9 % (ref 4.6–6.5)

## 2017-09-10 LAB — POCT GLYCOSYLATED HEMOGLOBIN (HGB A1C): Hemoglobin A1C: 5.7

## 2017-09-10 LAB — TSH: TSH: 1.53 u[IU]/mL (ref 0.35–4.50)

## 2017-09-10 MED ORDER — TELMISARTAN-HCTZ 80-25 MG PO TABS: 1.0000 | ORAL_TABLET | Freq: Every day | ORAL | 3 refills | Status: DC

## 2017-09-10 NOTE — Patient Instructions (Addendum)
I have sent a prescription to your pharmacy, for the different blood pressure pill.   Please consider these measures for your health:  minimize alcohol.  Do not use tobacco products.  Have a colonoscopy at least every 10 years from age 65.  Women should have an annual mammogram from age 35.  Keep firearms safely stored.  Always use seat belts.  have working smoke alarms in your home.  See an eye doctor and dentist regularly.  Never drive under the influence of alcohol or drugs (including prescription drugs).  Those with fair skin should take precautions against the sun, and should carefully examine their skin once per month, for any new or changed moles. please let me know what your wishes would be, if artificial life support measures should become necessary.  It is critically important to prevent falling down (keep floor areas well-lit, dry, and free of loose objects.  If you have a cane, walker, or wheelchair, you should use it, even for short trips around the house.  Wear flat-soled shoes.  Also, try not to rush).  blood tests are requested for you today.  We'll let you know about the results. Please come back for a follow-up appointment in 2 months.

## 2017-09-10 NOTE — Progress Notes (Signed)
Subjective:    Patient ID: Angela Estrada, female    DOB: 30-Jun-1950, 67 y.o.   MRN: [id number]  HPI Pt is here for regular wellness examination, and is feeling pretty well in general, and says chronic med probs are stable, except as noted below Past Medical History:  Diagnosis Date  . Allergy   . Arthritis   . Arthritis   . HYPERCHOLESTEROLEMIA 04/25/2007   takes Pravastatin daily  . HYPERTENSION 01/03/2007   takes Losartan-HCTZ  and Amlodipine daily  . Hypokalemia    takes Potassium daily  . Joint pain   . Nocturia   . Pneumonia 2 yrs ago    Past Surgical History:  Procedure Laterality Date  . CESAREAN SECTION  25 years ago  . CHOLECYSTECTOMY    . COLONOSCOPY    . DILATION AND CURETTAGE OF UTERUS  35 years ago  . HERNIA REPAIR    . INCISIONAL HERNIA REPAIR  02/21/2012   Procedure: LAPAROSCOPIC INCISIONAL HERNIA;  Surgeon: Harl Bowie, MD;  Location: [facility];  Service: General;  Laterality: N/A;  laparoscopic incisional hernia repair with mesh  . JOINT REPLACEMENT     bilateral knee replacements  . REPLACEMENT TOTAL KNEE BILATERAL  2010  . TOTAL HIP ARTHROPLASTY Right 03/19/2016   Procedure: RIGHT TOTAL HIP ARTHROPLASTY ANTERIOR APPROACH;  Surgeon: Frederik Pear, MD;  Location: [facility];  Service: Orthopedics;  Laterality: Right;  . TUBAL LIGATION      Social History   Socioeconomic History  . Marital status: Married    Spouse name: Not on file  . Number of children: 1  . Years of education: 12th grade +  . Highest education level: Not on file  Occupational History  . Occupation: Insurance claims handler  Social Needs  . Financial resource strain: Not on file  . Food insecurity:    Worry: Not on file    Inability: Not on file  . Transportation needs:    Medical: Not on file    Non-medical: Not on file  Tobacco Use  . Smoking status: Never Smoker  . Smokeless tobacco: Never Used  Substance and Sexual Activity  . Alcohol use: No    Alcohol/week: 0.0 oz  . Drug use:  No  . Sexual activity: Yes    Partners: Male    Birth control/protection: Post-menopausal  Lifestyle  . Physical activity:    Days per week: Not on file    Minutes per session: Not on file  . Stress: Not on file  Relationships  . Social connections:    Talks on phone: Not on file    Gets together: Not on file    Attends religious service: Not on file    Active member of club or organization: Not on file    Attends meetings of clubs or organizations: Not on file    Relationship status: Not on file  . Intimate partner violence:    Fear of current or ex partner: Not on file    Emotionally abused: Not on file    Physically abused: Not on file    Forced sexual activity: Not on file  Other Topics Concern  . Not on file  Social History Narrative   Lives with her husband.   Adult daughter lives nearby.    Current Outpatient Medications on File Prior to Visit  Medication Sig Dispense Refill  . amLODipine (NORVASC) 2.5 MG tablet Take 1 tablet (2.5 mg total) by mouth daily. 90 tablet 0  . aspirin EC  325 MG tablet Take 1 tablet (325 mg total) by mouth 2 (two) times daily. 30 tablet 0  . diclofenac sodium (VOLTAREN) 1 % GEL APPLY 2 GRAM(S) 2 TO 3 TIMES A DAY. AS NEEDED  1  . KLOR-CON 10 10 MEQ tablet TAKE 1 TABLET BY MOUTH EVERY DAY 30 tablet 11  . mometasone (NASONEX) 50 MCG/ACT nasal spray Place 2 sprays into the nose daily as needed. As needed for nasal allergies 17 g 11  . Multiple Vitamin (MULTIVITAMIN WITH MINERALS) TABS Take 1 tablet by mouth daily.    . pravastatin (PRAVACHOL) 20 MG tablet Take 1 tablet (20 mg total) by mouth daily. 90 tablet 1   No current facility-administered medications on file prior to visit.     Allergies  Allergen Reactions  . Lisinopril Cough  . Oxycodone Other (See Comments)    Hallucinations    Family History  Problem Relation Age of Onset  . Diabetes Mother   . Heart disease Mother   . Hypertension Mother   . Stroke Mother   . Diabetes  Father   . Heart disease Father        Triple Bypass  . Hypertension Father   . Heart attack Brother   . Congestive Heart Failure Brother   . Hypertension Brother   . Heart disease Brother   . Hypertension Sister   . Hypertension Sister   . Ovarian cancer Sister 73  . Hypertension Sister   . Hypertension Sister   . Hypertension Sister   . Hypertension Sister   . Cancer Neg Hx   . Colon cancer Neg Hx     BP (!) 160/90 (BP Location: Left Arm, Patient Position: Sitting, Cuff Size: Normal)   Pulse 81   Wt 217 lb 3.2 oz (98.5 kg)   LMP 08/30/1998 (Approximate)   BMI 39.73 kg/m     Review of Systems Denies fever, fatigue, photophobia, earache, chest pain, back pain, depression, cold intolerance, BRBPR, hematuria, syncope, numbness, allergy sxs, and rash.  She has easy bruising.    Objective:   Physical Exam VS: see vs page GEN: no distress HEAD: head: no deformity eyes: no periorbital swelling, no proptosis external nose and ears are normal mouth: no lesion seen NECK: supple, thyroid is not enlarged CHEST WALL: no deformity.  BREASTS: sees gyn CV: reg rate and rhythm, no murmur ABD: abdomen is soft, nontender.  no hepatosplenomegaly.  not distended.  no hernia GENITALIA/RECTAL: sees gyn MUSCULOSKELETAL: muscle bulk and strength are grossly normal.  no obvious joint swelling.  gait is steady, with a walker EXTEMITIES: no deformity.  no ulcer on the feet.  feet are of normal color and temp.  no edema PULSES: dorsalis pedis intact bilat.  no carotid bruit NEURO:  cn 2-12 grossly intact.   readily moves all 4's.  sensation is intact to touch on the feet SKIN:  Normal texture and temperature.  No rash or suspicious lesion is visible.   NODES:  None palpable at the neck PSYCH: alert, well-oriented.  Does not appear anxious nor depressed.   I personally reviewed electrocardiogram tracing (today):   Indication: HTN.  Impression: NSR.  No MI.  No hypertrophy.  Compared to  2017: no significant change      Assessment & Plan:  Wellness visit today, with problems stable, except as noted.   SEPARATE EVALUATION FOLLOWS--EACH PROBLEM HERE IS NEW, NOT RESPONDING TO TREATMENT, OR POSES SIGNIFICANT RISK TO THE PATIENT'S HEALTH: HISTORY OF THE PRESENT ILLNESS:  Pt returns for f/u of HTN.  She says BP med is out of stock.   PAST MEDICAL HISTORY Past Medical History:  Diagnosis Date  . Allergy   . Arthritis   . Arthritis   . HYPERCHOLESTEROLEMIA 04/25/2007   takes Pravastatin daily  . HYPERTENSION 01/03/2007   takes Losartan-HCTZ  and Amlodipine daily  . Hypokalemia    takes Potassium daily  . Joint pain   . Nocturia   . Pneumonia 2 yrs ago    Past Surgical History:  Procedure Laterality Date  . CESAREAN SECTION  25 years ago  . CHOLECYSTECTOMY    . COLONOSCOPY    . DILATION AND CURETTAGE OF UTERUS  35 years ago  . HERNIA REPAIR    . INCISIONAL HERNIA REPAIR  02/21/2012   Procedure: LAPAROSCOPIC INCISIONAL HERNIA;  Surgeon: Harl Bowie, MD;  Location: [facility];  Service: General;  Laterality: N/A;  laparoscopic incisional hernia repair with mesh  . JOINT REPLACEMENT     bilateral knee replacements  . REPLACEMENT TOTAL KNEE BILATERAL  2010  . TOTAL HIP ARTHROPLASTY Right 03/19/2016   Procedure: RIGHT TOTAL HIP ARTHROPLASTY ANTERIOR APPROACH;  Surgeon: Frederik Pear, MD;  Location: [facility];  Service: Orthopedics;  Laterality: Right;  . TUBAL LIGATION      Social History   Socioeconomic History  . Marital status: Married    Spouse name: Not on file  . Number of children: 1  . Years of education: 12th grade +  . Highest education level: Not on file  Occupational History  . Occupation: Insurance claims handler  Social Needs  . Financial resource strain: Not on file  . Food insecurity:    Worry: Not on file    Inability: Not on file  . Transportation needs:    Medical: Not on file    Non-medical: Not on file  Tobacco Use  . Smoking status: Never Smoker  .  Smokeless tobacco: Never Used  Substance and Sexual Activity  . Alcohol use: No    Alcohol/week: 0.0 oz  . Drug use: No  . Sexual activity: Yes    Partners: Male    Birth control/protection: Post-menopausal  Lifestyle  . Physical activity:    Days per week: Not on file    Minutes per session: Not on file  . Stress: Not on file  Relationships  . Social connections:    Talks on phone: Not on file    Gets together: Not on file    Attends religious service: Not on file    Active member of club or organization: Not on file    Attends meetings of clubs or organizations: Not on file    Relationship status: Not on file  . Intimate partner violence:    Fear of current or ex partner: Not on file    Emotionally abused: Not on file    Physically abused: Not on file    Forced sexual activity: Not on file  Other Topics Concern  . Not on file  Social History Narrative   Lives with her husband.   Adult daughter lives nearby.    Current Outpatient Medications on File Prior to Visit  Medication Sig Dispense Refill  . amLODipine (NORVASC) 2.5 MG tablet Take 1 tablet (2.5 mg total) by mouth daily. 90 tablet 0  . aspirin EC 325 MG tablet Take 1 tablet (325 mg total) by mouth 2 (two) times daily. 30 tablet 0  . diclofenac sodium (VOLTAREN) 1 % GEL APPLY 2 GRAM(S)  2 TO 3 TIMES A DAY. AS NEEDED  1  . KLOR-CON 10 10 MEQ tablet TAKE 1 TABLET BY MOUTH EVERY DAY 30 tablet 11  . mometasone (NASONEX) 50 MCG/ACT nasal spray Place 2 sprays into the nose daily as needed. As needed for nasal allergies 17 g 11  . Multiple Vitamin (MULTIVITAMIN WITH MINERALS) TABS Take 1 tablet by mouth daily.    . pravastatin (PRAVACHOL) 20 MG tablet Take 1 tablet (20 mg total) by mouth daily. 90 tablet 1   No current facility-administered medications on file prior to visit.     Allergies  Allergen Reactions  . Lisinopril Cough  . Oxycodone Other (See Comments)    Hallucinations    Family History  Problem Relation  Age of Onset  . Diabetes Mother   . Heart disease Mother   . Hypertension Mother   . Stroke Mother   . Diabetes Father   . Heart disease Father        Triple Bypass  . Hypertension Father   . Heart attack Brother   . Congestive Heart Failure Brother   . Hypertension Brother   . Heart disease Brother   . Hypertension Sister   . Hypertension Sister   . Ovarian cancer Sister 34  . Hypertension Sister   . Hypertension Sister   . Hypertension Sister   . Hypertension Sister   . Cancer Neg Hx   . Colon cancer Neg Hx     BP (!) 160/90 (BP Location: Left Arm, Patient Position: Sitting, Cuff Size: Normal)   Pulse 81   Wt 217 lb 3.2 oz (98.5 kg)   LMP 08/30/1998 (Approximate)   BMI 39.73 kg/m   REVIEW OF SYSTEMS: Denies sob PHYSICAL EXAMINATION: VITAL SIGNS:  See vs page GENERAL: no distress LUNGS:  Clear to auscultation LAB/XRAY RESULTS: Lab Results  Component Value Date   CREATININE 0.55 09/10/2017   BUN 13 09/10/2017   NA 137 09/10/2017   K 3.7 09/10/2017   CL 103 09/10/2017   CO2 27 09/10/2017   IMPRESSION: HTN: worse PLAN:  I have sent a prescription to your pharmacy, for an alternative to hyzaar

## 2017-09-16 ENCOUNTER — Ambulatory Visit: Payer: Medicare HMO | Admitting: Orthotics

## 2017-09-16 DIAGNOSIS — M76822 Posterior tibial tendinitis, left leg: Secondary | ICD-10-CM

## 2017-09-16 NOTE — Progress Notes (Signed)
Recast in attempt to get rear foot to subtalor neutral; displays good ff flexibility, however, challenge to get and stay in subtalor neutral.

## 2017-09-19 NOTE — Pre-Procedure Instructions (Signed)
Angela Estrada  09/19/2017      [id number] [place], [place] [address] [place] [address] Phone: [phone] Fax: [phone]  [id number] [place], [place] [address] [place] [address] Phone: [phone] Fax: [phone]    Your procedure is scheduled on April 24  Report to [facility] at 1045 A.M.  Call this number if you have problems the morning of surgery:  [phone]   Remember:  Do not eat food or drink liquids after midnight.  Take these medicines the morning of surgery with A SIP OF WATER Amlodipine (Norvasc),  Nasonex if needed  Stop taking aspirin as directed by your Dr.  Stop taking BC's, Goody's, Herbal Medications, Fish Oil, Ibuprofen, Advil, Motrin, Aleve, vitamins   Do not wear jewelry, make-up or nail polish.  Do not wear lotions, powders, or perfumes, or deodorant.  Do not shave 48 hours prior to surgery.  Men may shave face and neck.  Do not bring valuables to the hospital.  [facility] is not responsible for any belongings or valuables.  Contacts, dentures or bridgework may not be worn into surgery.  Leave your suitcase in the car.  After surgery it may be brought to your room.  For patients admitted to the hospital, discharge time will be determined by your treatment team.  Patients discharged the day of surgery will not be allowed to drive home.   Special instructions:   [facility]- Preparing For Surgery  Before surgery, you can play an important role. Because skin is not sterile, your skin needs to be as free of germs as possible. You can reduce the number of germs on your skin by washing with CHG (chlorahexidine gluconate) Soap before surgery.  CHG is an antiseptic cleaner which kills germs and bonds with the skin to continue killing germs even after washing.  Please do not use if you have an allergy to CHG or  antibacterial soaps. If your skin becomes reddened/irritated stop using the CHG.  Do not shave (including legs and underarms) for at least 48 hours prior to first CHG shower. It is OK to shave your face.  Please follow these instructions carefully.   1. Shower the NIGHT BEFORE SURGERY and the MORNING OF SURGERY with CHG.   2. If you chose to wash your hair, wash your hair first as usual with your normal shampoo.  3. After you shampoo, rinse your hair and body thoroughly to remove the shampoo.  4. Use CHG as you would any other liquid soap. You can apply CHG directly to the skin and wash gently with a scrungie or a clean washcloth.   5. Apply the CHG Soap to your body ONLY FROM THE NECK DOWN.  Do not use on open wounds or open sores. Avoid contact with your eyes, ears, mouth and genitals (private parts). Wash Face and genitals (private parts)  with your normal soap.  6. Wash thoroughly, paying special attention to the area where your surgery will be performed.  7. Thoroughly rinse your body with warm water from the neck down.  8. DO NOT shower/wash with your normal soap after using and rinsing off the CHG Soap.  9. Pat yourself dry with a CLEAN TOWEL.  10. Wear CLEAN PAJAMAS to bed the night before surgery, wear comfortable clothes the morning of surgery  11. Place CLEAN SHEETS on your  bed the night of your first shower and DO NOT SLEEP WITH PETS.    Day of Surgery: Do not apply any deodorants/lotions. Please wear clean clothes to the hospital/surgery center.      Please read over the following fact sheets that you were given. Pain Booklet, Coughing and Deep Breathing, MRSA Information and Surgical Site Infection Prevention

## 2017-09-20 ENCOUNTER — Ambulatory Visit (HOSPITAL_COMMUNITY)
Admission: RE | Admit: 2017-09-20 | Discharge: 2017-09-20 | Disposition: A | Discharge status: Home / Self Care | Payer: Medicare HMO | Source: Ambulatory Visit | Attending: Orthopedic Surgery | Admitting: Orthopedic Surgery

## 2017-09-20 ENCOUNTER — Encounter (HOSPITAL_COMMUNITY): Payer: Self-pay

## 2017-09-20 ENCOUNTER — Other Ambulatory Visit: Payer: Self-pay

## 2017-09-20 ENCOUNTER — Encounter (HOSPITAL_COMMUNITY)
Admission: RE | Admit: 2017-09-20 | Discharge: 2017-09-20 | Disposition: A | Discharge status: Home / Self Care | Payer: Medicare HMO | Source: Ambulatory Visit | Attending: Orthopedic Surgery | Admitting: Orthopedic Surgery

## 2017-09-20 DIAGNOSIS — Z01818 Encounter for other preprocedural examination: Secondary | ICD-10-CM | POA: Diagnosis not present

## 2017-09-20 DIAGNOSIS — Z01812 Encounter for preprocedural laboratory examination: Secondary | ICD-10-CM | POA: Diagnosis not present

## 2017-09-20 DIAGNOSIS — Z01810 Encounter for preprocedural cardiovascular examination: Secondary | ICD-10-CM | POA: Diagnosis present

## 2017-09-20 LAB — BASIC METABOLIC PANEL
Anion gap: 12 (ref 5–15)
BUN: 11 mg/dL (ref 6–20)
CO2: 26 mmol/L (ref 22–32)
Calcium: 9.6 mg/dL (ref 8.9–10.3)
Chloride: 102 mmol/L (ref 101–111)
Creatinine, Ser: 0.76 mg/dL (ref 0.44–1.00)
GFR calc Af Amer: >60 mL/min (ref >60)
GFR calc non Af Amer: >60 mL/min (ref >60)
Glucose, Bld: 80 mg/dL (ref 65–99)
Potassium: 2.9 mmol/L — ABNORMAL LOW (ref 3.5–5.1)
Sodium: 140 mmol/L (ref 135–145)

## 2017-09-20 LAB — TYPE AND SCREEN
ABO/RH(D): A POS
Antibody Screen: NEGATIVE

## 2017-09-20 LAB — URINALYSIS, ROUTINE W REFLEX MICROSCOPIC
Bilirubin Urine: NEGATIVE
Glucose, UA: NEGATIVE mg/dL
Hgb urine dipstick: NEGATIVE
Ketones, ur: NEGATIVE mg/dL
Leukocytes, UA: NEGATIVE
Nitrite: NEGATIVE
Protein, ur: NEGATIVE mg/dL
Specific Gravity, Urine: 1.024 (ref 1.005–1.030)
pH: 5 (ref 5.0–8.0)

## 2017-09-20 LAB — CBC WITH DIFFERENTIAL/PLATELET
Basophils Absolute: 0 10*3/uL (ref 0.0–0.1)
Basophils Relative: 0 %
Eosinophils Absolute: 0.2 10*3/uL (ref 0.0–0.7)
Eosinophils Relative: 2 %
HCT: 45 % (ref 36.0–46.0)
Hemoglobin: 15.2 g/dL — ABNORMAL HIGH (ref 12.0–15.0)
Lymphocytes Relative: 27 %
Lymphs Abs: 1.9 10*3/uL (ref 0.7–4.0)
MCH: 30.7 pg (ref 26.0–34.0)
MCHC: 33.8 g/dL (ref 30.0–36.0)
MCV: 90.9 fL (ref 78.0–100.0)
Monocytes Absolute: 0.3 10*3/uL (ref 0.1–1.0)
Monocytes Relative: 4 %
Neutro Abs: 4.7 10*3/uL (ref 1.7–7.7)
Neutrophils Relative %: 67 %
Platelets: 287 10*3/uL (ref 150–400)
RBC: 4.95 MIL/uL (ref 3.87–5.11)
RDW: 14 % (ref 11.5–15.5)
WBC: 7 10*3/uL (ref 4.0–10.5)

## 2017-09-20 LAB — SURGICAL PCR SCREEN
MRSA, PCR: NEGATIVE
Staphylococcus aureus: NEGATIVE

## 2017-09-20 LAB — PROTIME-INR
INR: 0.98
Prothrombin Time: 12.9 seconds (ref 11.4–15.2)

## 2017-09-20 LAB — APTT: aPTT: 33 seconds (ref 24–36)

## 2017-09-20 NOTE — Progress Notes (Signed)
PCP is Dr. Renato Shin Denies ever seeing a cardiologist. Denies ever having a card cath, stress test, or echo. Denies chest pain, cough, or fever.  States she was not told to stop taking aspirin, but last time she stopped 7 days before surgery. Encouraged her if she had questions to call Dr Damita Dunnings office about the aspirin.

## 2017-09-23 ENCOUNTER — Telehealth: Payer: Self-pay | Admitting: Endocrinology

## 2017-09-23 ENCOUNTER — Other Ambulatory Visit (HOSPITAL_COMMUNITY): Payer: Medicare HMO

## 2017-09-23 DIAGNOSIS — E876 Hypokalemia: Secondary | ICD-10-CM

## 2017-09-23 MED ORDER — POTASSIUM CHLORIDE CRYS ER 20 MEQ PO TBCR: 20.0000 meq | EXTENDED_RELEASE_TABLET | Freq: Two times a day (BID) | ORAL | 3 refills | Status: DC

## 2017-09-23 NOTE — Progress Notes (Signed)
Anesthesia Chart Review:  Pt is a 67 year old female scheduled for R total hip revision posterior on 10/02/2017 with Frederik Pear, MD  - PCP is Renato Shin, MD  PMH includes:  HTN, hyperlipidemia. Never smoker. BMI 38. S/p R THA 03/19/16. S/p incisional hernia repair 02/21/12  Medications include: Amlodipine, ASA 81 mg, potassium, pravastatin, telmisartan-HCTZ.  BP (!) 162/73   Pulse 77   Resp 20   Ht 5\' 3"  (1.6 m)   Wt 213 lb 14.4 oz (97 kg)   LMP 08/30/1998 (Approximate)   SpO2 99%   BMI 37.89 kg/m    Preoperative labs reviewed.   - K is 2.9. Juliann Pulse in Dr. Damita Dunnings office is aware and is in communication with PCP's office about increasing potassium dose.  Will recheck day of surgery.   CXR 09/20/17: No active cardiopulmonary disease.  EKG 09/10/17: sinus rhythm  If labs acceptable day of surgery, I anticipate pt can proceed with surgery as scheduled.   Willeen Cass, FNP-BC [facility] Short Stay Surgical Center/Anesthesiology Phone: [phone] 09/23/2017 2:31 PM

## 2017-09-23 NOTE — Telephone Encounter (Signed)
Pt stated  Dr Mayer Camel said she need to upped the dosage of her potassium before she have her surgery.  Please advise  [id number] [place], [place] [phone] (Phone) [phone] (Fax)

## 2017-09-23 NOTE — Telephone Encounter (Signed)
Please advise 

## 2017-09-23 NOTE — Telephone Encounter (Signed)
Ok, I have sent a prescription to your pharmacy, to double it Please redo the blood test next week

## 2017-09-24 NOTE — Telephone Encounter (Signed)
I have called & informed patient that prescription was sent to pharmacy. She had lab appt for next Monday 4/22 to have lab redrawn.

## 2017-09-30 ENCOUNTER — Other Ambulatory Visit (INDEPENDENT_AMBULATORY_CARE_PROVIDER_SITE_OTHER): Payer: Medicare HMO

## 2017-09-30 DIAGNOSIS — E876 Hypokalemia: Secondary | ICD-10-CM | POA: Diagnosis not present

## 2017-09-30 LAB — BASIC METABOLIC PANEL
BUN: 18 mg/dL (ref 6–23)
CO2: 29 mEq/L (ref 19–32)
Calcium: 9.9 mg/dL (ref 8.4–10.5)
Chloride: 100 mEq/L (ref 96–112)
Creatinine, Ser: 0.79 mg/dL (ref 0.40–1.20)
GFR: 93.88 mL/min (ref >60.00)
Glucose, Bld: 101 mg/dL — ABNORMAL HIGH (ref 70–99)
Potassium: 4 mEq/L (ref 3.5–5.1)
Sodium: 138 mEq/L (ref 135–145)

## 2017-10-01 ENCOUNTER — Telehealth: Payer: Self-pay

## 2017-10-01 DIAGNOSIS — Z96649 Presence of unspecified artificial hip joint: Secondary | ICD-10-CM

## 2017-10-01 DIAGNOSIS — T84018A Broken internal joint prosthesis, other site, initial encounter: Secondary | ICD-10-CM

## 2017-10-01 MED ORDER — SODIUM CHLORIDE 0.9 % IV SOLN
2000.0000 mg | INTRAVENOUS | Status: AC
  Administered 2017-10-02: 2000 mg via TOPICAL
  Filled 2017-10-01: qty 20

## 2017-10-01 MED ORDER — BUPIVACAINE LIPOSOME 1.3 % IJ SUSP
20.0000 mL | Freq: Once | INTRAMUSCULAR | Status: AC
  Administered 2017-10-02: 20 mL
  Filled 2017-10-01: qty 20

## 2017-10-01 MED ORDER — CEFAZOLIN SODIUM-DEXTROSE 2-4 GM/100ML-% IV SOLN
2.0000 g | INTRAVENOUS | Status: AC
  Administered 2017-10-02: 2 g via INTRAVENOUS
  Filled 2017-10-01: qty 100

## 2017-10-01 MED ORDER — LACTATED RINGERS IV SOLN: INTRAVENOUS | Status: DC

## 2017-10-01 MED ORDER — TRANEXAMIC ACID 1000 MG/10ML IV SOLN
1000.0000 mg | INTRAVENOUS | Status: AC
  Administered 2017-10-02: 1000 mg via INTRAVENOUS
  Filled 2017-10-01: qty 1100

## 2017-10-01 NOTE — Telephone Encounter (Signed)
I called and spoke with patient to make 2 week post surgery follow up per Dr. Cordelia Pen request. I scheduled patient for 3rd week of May. Patient had potassium labs done yesterday for surgical clearance. I have faxed labs to [facility] with [place]. Sandi Raveling so that patient is cleared for surgery tomorrow 4/24.

## 2017-10-01 NOTE — H&P (Signed)
TOTAL HIP REVISION ADMISSION H&P  Patient is admitted for right revision total hip arthroplasty.  Subjective:  Chief Complaint: right hip pain  HPI: Angela Estrada, 67 y.o. female, has a history of pain and functional disability in the right hip due to arthritis and patient has failed non-surgical conservative treatments for greater than 12 weeks to include NSAID's and/or analgesics, flexibility and strengthening excercises, use of assistive devices and activity modification. The indications for the revision total hip arthroplasty are loosening of one or more components.  Onset of symptoms was gradual starting 2 years ago with gradually worsening course since that time.  Prior procedures on the right hip include arthroplasty.  Patient currently rates pain in the right hip at 10 out of 10 with activity.  There is night pain, worsening of pain with activity and weight bearing, trendelenberg gait, pain that interfers with activities of daily living and pain with passive range of motion. Patient has evidence of prosthetic loosening by imaging studies.  This condition presents safety issues increasing the risk of falls.  There is no current active infection.  Patient Active Problem List   Diagnosis Date Noted  . Arthritis of right hip 03/19/2016  . Primary osteoarthritis of right hip 03/18/2016  . Hypokalemia 03/29/2014  . Arthralgia 02/11/2014  . Routine general medical examination at a health care facility 03/28/2013  . Nonspecific abnormal findings on radiological and examination of lung field 03/27/2013  . Cough 03/11/2013  . Unspecified disorder of liver 03/09/2013  . Pyuria 03/09/2013  . Arthritis   . Ventral hernia 01/18/2012  . Encounter for long-term (current) use of other medications 07/05/2011  . HYPERGLYCEMIA 08/12/2008  . HYPERCHOLESTEROLEMIA 04/25/2007  . DIZZINESS AND GIDDINESS 04/25/2007  . Essential hypertension 01/03/2007  . ALLERGIC RHINITIS 01/03/2007   Past Medical  History:  Diagnosis Date  . Allergy   . Arthritis   . Arthritis   . HYPERCHOLESTEROLEMIA 04/25/2007   takes Pravastatin daily  . HYPERTENSION 01/03/2007   takes Losartan-HCTZ  and Amlodipine daily  . Hypokalemia    takes Potassium daily  . Joint pain   . Nocturia   . Pneumonia 2 yrs ago    Past Surgical History:  Procedure Laterality Date  . CESAREAN SECTION  25 years ago  . CHOLECYSTECTOMY    . COLONOSCOPY    . DILATION AND CURETTAGE OF UTERUS  35 years ago  . HERNIA REPAIR    . INCISIONAL HERNIA REPAIR  02/21/2012   Procedure: LAPAROSCOPIC INCISIONAL HERNIA;  Surgeon: Harl Bowie, MD;  Location: [facility];  Service: General;  Laterality: N/A;  laparoscopic incisional hernia repair with mesh  . JOINT REPLACEMENT     bilateral knee replacements  . REPLACEMENT TOTAL KNEE BILATERAL  2010  . TOTAL HIP ARTHROPLASTY Right 03/19/2016   Procedure: RIGHT TOTAL HIP ARTHROPLASTY ANTERIOR APPROACH;  Surgeon: Frederik Pear, MD;  Location: [facility];  Service: Orthopedics;  Laterality: Right;  . TUBAL LIGATION      No current facility-administered medications for this encounter.    Current Outpatient Medications  Medication Sig Dispense Refill Last Dose  . amLODipine (NORVASC) 2.5 MG tablet Take 1 tablet (2.5 mg total) by mouth daily. 90 tablet 0 Taking  . aspirin EC 81 MG tablet Take 81 mg by mouth daily.     . Carboxymethylcellulose Sodium (LUBRICANT EYE DROPS OP) Place 1 drop into both eyes daily as needed (allergies).     . diclofenac sodium (VOLTAREN) 1 % GEL APPLY 2 GRAM(S) 2  TO 3 TIMES A DAY AS NEEDED FOR PAIN  1 Taking  . ibuprofen (ADVIL,MOTRIN) 200 MG tablet Take 200-400 mg by mouth daily as needed for moderate pain.     . mometasone (NASONEX) 50 MCG/ACT nasal spray Place 2 sprays into the nose daily as needed. As needed for nasal allergies (Patient taking differently: Place 2 sprays into the nose daily as needed (allergies). ) 17 g 11 Taking  . Multiple Vitamin (MULTIVITAMIN WITH  MINERALS) TABS Take 1 tablet by mouth daily.   Taking  . pravastatin (PRAVACHOL) 20 MG tablet Take 1 tablet (20 mg total) by mouth daily. (Patient taking differently: Take 20 mg by mouth every evening. ) 90 tablet 1 Taking  . telmisartan-hydrochlorothiazide (MICARDIS HCT) 80-25 MG tablet Take 1 tablet by mouth daily. 30 tablet 3   . aspirin EC 325 MG tablet Take 1 tablet (325 mg total) by mouth 2 (two) times daily. (Patient not taking: Reported on 09/16/2017) 30 tablet 0 Not Taking at Unknown time  . potassium chloride SA (K-DUR,KLOR-CON) 20 MEQ tablet Take 1 tablet (20 mEq total) by mouth 2 (two) times daily. 90 tablet 3    Allergies  Allergen Reactions  . Lisinopril Cough  . Oxycodone Other (See Comments)    Hallucinations    Social History   Tobacco Use  . Smoking status: Never Smoker  . Smokeless tobacco: Never Used  Substance Use Topics  . Alcohol use: No    Alcohol/week: 0.0 oz    Family History  Problem Relation Age of Onset  . Diabetes Mother   . Heart disease Mother   . Hypertension Mother   . Stroke Mother   . Diabetes Father   . Heart disease Father        Triple Bypass  . Hypertension Father   . Heart attack Brother   . Congestive Heart Failure Brother   . Hypertension Brother   . Heart disease Brother   . Hypertension Sister   . Hypertension Sister   . Ovarian cancer Sister 26  . Hypertension Sister   . Hypertension Sister   . Hypertension Sister   . Hypertension Sister   . Cancer Neg Hx   . Colon cancer Neg Hx       Review of Systems  Constitutional: Negative.   HENT: Positive for sinus pain.   Eyes: Negative.   Respiratory: Negative.   Cardiovascular:       HTN  Gastrointestinal: Negative.   Genitourinary: Negative.   Musculoskeletal: Positive for joint pain and myalgias.  Skin: Negative.   Neurological: Negative.   Endo/Heme/Allergies: Bruises/bleeds easily.  Psychiatric/Behavioral: Negative.     Objective:  Physical Exam   Constitutional: She is oriented to person, place, and time. She appears well-developed and well-nourished.  HENT:  Head: Normocephalic and atraumatic.  Eyes: Pupils are equal, round, and reactive to light.  Neck: Normal range of motion. Neck supple.  Cardiovascular: Intact distal pulses.  Respiratory: Effort normal.  Musculoskeletal: She exhibits tenderness.  Patient walks with a right sided antalgic gait.  Internal rotation and external rotation causes discomfort.  Foot tap is negative.  Nontender over the greater trochanter.  The anterior hip wound is well-healed.  Neurological: She is alert and oriented to person, place, and time.  Skin: Skin is warm and dry.  Psychiatric: She has a normal mood and affect. Judgment and thought content normal.    Vital signs in last 24 hours:     Labs:   Estimated body mass  index is 37.89 kg/m as calculated from the following:   Height as of 09/20/17: 5\' 3"  (1.6 m).   Weight as of 09/20/17: 97 kg (213 lb 14.4 oz).  Imaging Review:  Plain radiographs demonstrate  parallel line up and down the ingrowth area that was not evident on prior films.  Specifically the inferior shoulder of the porous coating has a clear zone on it now.   Preoperative templating of the joint replacement has been completed, documented, and submitted to the Operating Room personnel in order to optimize intra-operative equipment management.   Assessment/Plan:  End stage arthritis, right hip(s) with failed previous arthroplasty.  The patient history, physical examination, clinical judgement of the provider and imaging studies are consistent with end stage degenerative joint disease of the right hip(s), previous total hip arthroplasty. Revision total hip arthroplasty is deemed medically necessary. The treatment options including medical management, injection therapy, arthroscopy and arthroplasty were discussed at length. The risks and benefits of total hip arthroplasty were  presented and reviewed. The risks due to aseptic loosening, infection, stiffness, dislocation/subluxation,  thromboembolic complications and other imponderables were discussed.  The patient acknowledged the explanation, agreed to proceed with the plan and consent was signed. Patient is being admitted for inpatient treatment for surgery, pain control, PT, OT, prophylactic antibiotics, VTE prophylaxis, progressive ambulation and ADL's and discharge planning. The patient is planning to be discharged home with home health services

## 2017-10-02 ENCOUNTER — Inpatient Hospital Stay (HOSPITAL_COMMUNITY)
Admission: RE | Admit: 2017-10-02 | Discharge: 2017-10-04 | DRG: 467 | Disposition: A | Discharge status: Home Health | Payer: Medicare HMO | Source: Ambulatory Visit | Attending: Orthopedic Surgery | Admitting: Orthopedic Surgery

## 2017-10-02 ENCOUNTER — Inpatient Hospital Stay (HOSPITAL_COMMUNITY): Payer: Medicare HMO | Admitting: Emergency Medicine

## 2017-10-02 ENCOUNTER — Encounter (HOSPITAL_COMMUNITY): Payer: Self-pay | Admitting: Certified Registered Nurse Anesthetist

## 2017-10-02 ENCOUNTER — Encounter (HOSPITAL_COMMUNITY)
Admission: RE | Disposition: A | Discharge status: Home Health | Payer: Self-pay | Source: Ambulatory Visit | Attending: Orthopedic Surgery

## 2017-10-02 ENCOUNTER — Inpatient Hospital Stay (HOSPITAL_COMMUNITY): Payer: Medicare HMO

## 2017-10-02 ENCOUNTER — Other Ambulatory Visit: Payer: Self-pay

## 2017-10-02 DIAGNOSIS — M25551 Pain in right hip: Secondary | ICD-10-CM | POA: Diagnosis present

## 2017-10-02 DIAGNOSIS — Z9181 History of falling: Secondary | ICD-10-CM | POA: Diagnosis not present

## 2017-10-02 DIAGNOSIS — Z6838 Body mass index (BMI) 38.0-38.9, adult: Secondary | ICD-10-CM | POA: Diagnosis not present

## 2017-10-02 DIAGNOSIS — Z6837 Body mass index (BMI) 37.0-37.9, adult: Secondary | ICD-10-CM | POA: Diagnosis not present

## 2017-10-02 DIAGNOSIS — Z7982 Long term (current) use of aspirin: Secondary | ICD-10-CM

## 2017-10-02 DIAGNOSIS — I10 Essential (primary) hypertension: Secondary | ICD-10-CM | POA: Diagnosis not present

## 2017-10-02 DIAGNOSIS — K439 Ventral hernia without obstruction or gangrene: Secondary | ICD-10-CM | POA: Diagnosis not present

## 2017-10-02 DIAGNOSIS — J309 Allergic rhinitis, unspecified: Secondary | ICD-10-CM | POA: Diagnosis not present

## 2017-10-02 DIAGNOSIS — Y792 Prosthetic and other implants, materials and accessory orthopedic devices associated with adverse incidents: Secondary | ICD-10-CM | POA: Diagnosis present

## 2017-10-02 DIAGNOSIS — R269 Unspecified abnormalities of gait and mobility: Secondary | ICD-10-CM | POA: Diagnosis not present

## 2017-10-02 DIAGNOSIS — Z96649 Presence of unspecified artificial hip joint: Secondary | ICD-10-CM

## 2017-10-02 DIAGNOSIS — E7800 Pure hypercholesterolemia, unspecified: Secondary | ICD-10-CM | POA: Diagnosis present

## 2017-10-02 DIAGNOSIS — Z96641 Presence of right artificial hip joint: Secondary | ICD-10-CM | POA: Diagnosis not present

## 2017-10-02 DIAGNOSIS — T84030A Mechanical loosening of internal right hip prosthetic joint, initial encounter: Secondary | ICD-10-CM | POA: Diagnosis not present

## 2017-10-02 DIAGNOSIS — D62 Acute posthemorrhagic anemia: Secondary | ICD-10-CM | POA: Diagnosis not present

## 2017-10-02 DIAGNOSIS — E669 Obesity, unspecified: Secondary | ICD-10-CM | POA: Diagnosis present

## 2017-10-02 DIAGNOSIS — M1611 Unilateral primary osteoarthritis, right hip: Secondary | ICD-10-CM | POA: Diagnosis present

## 2017-10-02 DIAGNOSIS — Z8249 Family history of ischemic heart disease and other diseases of the circulatory system: Secondary | ICD-10-CM | POA: Diagnosis not present

## 2017-10-02 DIAGNOSIS — Z471 Aftercare following joint replacement surgery: Secondary | ICD-10-CM | POA: Diagnosis not present

## 2017-10-02 DIAGNOSIS — T84018A Broken internal joint prosthesis, other site, initial encounter: Secondary | ICD-10-CM

## 2017-10-02 HISTORY — PX: TOTAL HIP REVISION: SHX763

## 2017-10-02 LAB — POCT I-STAT 4, (NA,K, GLUC, HGB,HCT)
Glucose, Bld: 114 mg/dL — ABNORMAL HIGH (ref 65–99)
HCT: 42 % (ref 36.0–46.0)
Hemoglobin: 14.3 g/dL (ref 12.0–15.0)
Potassium: 3.7 mmol/L (ref 3.5–5.1)
Sodium: 140 mmol/L (ref 135–145)

## 2017-10-02 SURGERY — TOTAL HIP REVISION
Anesthesia: Monitor Anesthesia Care | Site: Hip | Laterality: Right

## 2017-10-02 MED ORDER — IRBESARTAN 300 MG PO TABS
300.0000 mg | ORAL_TABLET | Freq: Every day | ORAL | Status: DC
  Administered 2017-10-03 – 2017-10-04 (×2): 300 mg via ORAL
  Filled 2017-10-02 (×2): qty 1

## 2017-10-02 MED ORDER — CELECOXIB 200 MG PO CAPS
200.0000 mg | ORAL_CAPSULE | Freq: Two times a day (BID) | ORAL | Status: DC
  Administered 2017-10-02 – 2017-10-04 (×4): 200 mg via ORAL
  Filled 2017-10-02 (×4): qty 1

## 2017-10-02 MED ORDER — ACETAMINOPHEN 500 MG PO TABS
1000.0000 mg | ORAL_TABLET | Freq: Four times a day (QID) | ORAL | Status: AC
  Administered 2017-10-02 – 2017-10-03 (×4): 1000 mg via ORAL
  Filled 2017-10-02 (×4): qty 2

## 2017-10-02 MED ORDER — BUPIVACAINE-EPINEPHRINE 0.5% -1:200000 IJ SOLN
INTRAMUSCULAR | Status: DC | PRN
  Administered 2017-10-02: 30 mL
  Administered 2017-10-02: 20 mL

## 2017-10-02 MED ORDER — SODIUM CHLORIDE 0.9% FLUSH
INTRAVENOUS | Status: DC | PRN
  Administered 2017-10-02: 10 mL

## 2017-10-02 MED ORDER — EPHEDRINE 5 MG/ML INJ
INTRAVENOUS | Status: AC
  Filled 2017-10-02: qty 10

## 2017-10-02 MED ORDER — FLUTICASONE PROPIONATE 50 MCG/ACT NA SUSP
1.0000 | Freq: Every day | NASAL | Status: DC
  Administered 2017-10-04: 1 via NASAL
  Filled 2017-10-02: qty 16

## 2017-10-02 MED ORDER — ACETAMINOPHEN 160 MG/5ML PO SOLN: 325.0000 mg | ORAL | Status: DC | PRN

## 2017-10-02 MED ORDER — TRANEXAMIC ACID 1000 MG/10ML IV SOLN
1000.0000 mg | Freq: Once | INTRAVENOUS | Status: AC
  Administered 2017-10-02: 1000 mg via INTRAVENOUS
  Filled 2017-10-02: qty 1100

## 2017-10-02 MED ORDER — TELMISARTAN-HCTZ 80-25 MG PO TABS: 1.0000 | ORAL_TABLET | Freq: Every day | ORAL | Status: DC

## 2017-10-02 MED ORDER — BUPIVACAINE IN DEXTROSE 0.75-8.25 % IT SOLN
INTRATHECAL | Status: DC | PRN
  Administered 2017-10-02: 1.6 mL via INTRATHECAL

## 2017-10-02 MED ORDER — FENTANYL CITRATE (PF) 250 MCG/5ML IJ SOLN
INTRAMUSCULAR | Status: AC
  Filled 2017-10-02: qty 5

## 2017-10-02 MED ORDER — ALUM & MAG HYDROXIDE-SIMETH 200-200-20 MG/5ML PO SUSP: 30.0000 mL | ORAL | Status: DC | PRN

## 2017-10-02 MED ORDER — METHOCARBAMOL 500 MG PO TABS
500.0000 mg | ORAL_TABLET | Freq: Four times a day (QID) | ORAL | Status: DC | PRN
  Administered 2017-10-02 – 2017-10-04 (×5): 500 mg via ORAL
  Filled 2017-10-02 (×5): qty 1

## 2017-10-02 MED ORDER — ONDANSETRON HCL 4 MG/2ML IJ SOLN: 4.0000 mg | Freq: Four times a day (QID) | INTRAMUSCULAR | Status: DC | PRN

## 2017-10-02 MED ORDER — DEXAMETHASONE SODIUM PHOSPHATE 10 MG/ML IJ SOLN
10.0000 mg | Freq: Once | INTRAMUSCULAR | Status: AC
  Administered 2017-10-03: 10 mg via INTRAVENOUS
  Filled 2017-10-02: qty 1

## 2017-10-02 MED ORDER — HYDROMORPHONE HCL 2 MG PO TABS: 2.0000 mg | ORAL_TABLET | ORAL | Status: DC | PRN

## 2017-10-02 MED ORDER — MIDAZOLAM HCL 2 MG/2ML IJ SOLN
INTRAMUSCULAR | Status: AC
  Filled 2017-10-02: qty 2

## 2017-10-02 MED ORDER — LACTATED RINGERS IV SOLN
INTRAVENOUS | Status: DC
  Administered 2017-10-02: 10:00:00 via INTRAVENOUS

## 2017-10-02 MED ORDER — BISACODYL 5 MG PO TBEC: 5.0000 mg | DELAYED_RELEASE_TABLET | Freq: Every day | ORAL | Status: DC | PRN

## 2017-10-02 MED ORDER — DIPHENHYDRAMINE HCL 12.5 MG/5ML PO ELIX: 12.5000 mg | ORAL_SOLUTION | ORAL | Status: DC | PRN

## 2017-10-02 MED ORDER — PRAVASTATIN SODIUM 20 MG PO TABS
20.0000 mg | ORAL_TABLET | Freq: Every evening | ORAL | Status: DC
Start: 2017-10-02 — End: 2017-10-04
  Administered 2017-10-02 – 2017-10-03 (×2): 20 mg via ORAL
  Filled 2017-10-02 (×2): qty 1

## 2017-10-02 MED ORDER — ASPIRIN EC 325 MG PO TBEC
325.0000 mg | DELAYED_RELEASE_TABLET | Freq: Every day | ORAL | Status: DC
  Administered 2017-10-03 – 2017-10-04 (×2): 325 mg via ORAL
  Filled 2017-10-02 (×2): qty 1

## 2017-10-02 MED ORDER — PROPOFOL 500 MG/50ML IV EMUL
INTRAVENOUS | Status: DC | PRN
Start: 2017-10-02 — End: 2017-10-02
  Administered 2017-10-02: 100 ug/kg/min via INTRAVENOUS

## 2017-10-02 MED ORDER — MENTHOL 3 MG MT LOZG: 1.0000 | LOZENGE | OROMUCOSAL | Status: DC | PRN

## 2017-10-02 MED ORDER — METOCLOPRAMIDE HCL 5 MG/ML IJ SOLN: 5.0000 mg | Freq: Three times a day (TID) | INTRAMUSCULAR | Status: DC | PRN

## 2017-10-02 MED ORDER — MEPERIDINE HCL 50 MG/ML IJ SOLN: 6.2500 mg | INTRAMUSCULAR | Status: DC | PRN

## 2017-10-02 MED ORDER — FLEET ENEMA 7-19 GM/118ML RE ENEM: 1.0000 | ENEMA | Freq: Once | RECTAL | Status: DC | PRN

## 2017-10-02 MED ORDER — FENTANYL CITRATE (PF) 250 MCG/5ML IJ SOLN
INTRAMUSCULAR | Status: DC | PRN
  Administered 2017-10-02 (×2): 25 ug via INTRAVENOUS

## 2017-10-02 MED ORDER — ONDANSETRON HCL 4 MG/2ML IJ SOLN
INTRAMUSCULAR | Status: AC
  Filled 2017-10-02: qty 2

## 2017-10-02 MED ORDER — MIDAZOLAM HCL 5 MG/5ML IJ SOLN
INTRAMUSCULAR | Status: DC | PRN
  Administered 2017-10-02: 1 mg via INTRAVENOUS

## 2017-10-02 MED ORDER — FENTANYL CITRATE (PF) 100 MCG/2ML IJ SOLN
25.0000 ug | INTRAMUSCULAR | Status: DC | PRN
  Administered 2017-10-02: 25 ug via INTRAVENOUS

## 2017-10-02 MED ORDER — METOCLOPRAMIDE HCL 5 MG PO TABS: 5.0000 mg | ORAL_TABLET | Freq: Three times a day (TID) | ORAL | Status: DC | PRN

## 2017-10-02 MED ORDER — HYDROCHLOROTHIAZIDE 25 MG PO TABS
25.0000 mg | ORAL_TABLET | Freq: Every day | ORAL | Status: DC
  Administered 2017-10-03 – 2017-10-04 (×2): 25 mg via ORAL
  Filled 2017-10-02 (×2): qty 1

## 2017-10-02 MED ORDER — DEXAMETHASONE SODIUM PHOSPHATE 10 MG/ML IJ SOLN
INTRAMUSCULAR | Status: DC | PRN
  Administered 2017-10-02: 10 mg via INTRAVENOUS

## 2017-10-02 MED ORDER — CEFUROXIME SODIUM 1.5 G IV SOLR
INTRAVENOUS | Status: AC
  Filled 2017-10-02: qty 1.5

## 2017-10-02 MED ORDER — HYDROMORPHONE HCL 2 MG PO TABS: 2.0000 mg | ORAL_TABLET | ORAL | 0 refills | Status: DC | PRN

## 2017-10-02 MED ORDER — HYDROMORPHONE HCL 2 MG/ML IJ SOLN: 0.5000 mg | INTRAMUSCULAR | Status: DC | PRN

## 2017-10-02 MED ORDER — OXYCODONE HCL 5 MG PO TABS: 5.0000 mg | ORAL_TABLET | Freq: Once | ORAL | Status: DC | PRN

## 2017-10-02 MED ORDER — ONDANSETRON HCL 4 MG/2ML IJ SOLN: 4.0000 mg | Freq: Once | INTRAMUSCULAR | Status: DC | PRN

## 2017-10-02 MED ORDER — FENTANYL CITRATE (PF) 100 MCG/2ML IJ SOLN
INTRAMUSCULAR | Status: AC
  Filled 2017-10-02: qty 2

## 2017-10-02 MED ORDER — GABAPENTIN 300 MG PO CAPS
300.0000 mg | ORAL_CAPSULE | Freq: Three times a day (TID) | ORAL | Status: DC
  Administered 2017-10-02 – 2017-10-04 (×5): 300 mg via ORAL
  Filled 2017-10-02 (×5): qty 1

## 2017-10-02 MED ORDER — OXYCODONE HCL 5 MG/5ML PO SOLN: 5.0000 mg | Freq: Once | ORAL | Status: DC | PRN

## 2017-10-02 MED ORDER — POLYETHYLENE GLYCOL 3350 17 G PO PACK: 17.0000 g | PACK | Freq: Every day | ORAL | Status: DC | PRN

## 2017-10-02 MED ORDER — BUPIVACAINE-EPINEPHRINE (PF) 0.5% -1:200000 IJ SOLN
INTRAMUSCULAR | Status: AC
  Filled 2017-10-02: qty 60

## 2017-10-02 MED ORDER — DOCUSATE SODIUM 100 MG PO CAPS
100.0000 mg | ORAL_CAPSULE | Freq: Two times a day (BID) | ORAL | Status: DC
  Administered 2017-10-02 – 2017-10-04 (×4): 100 mg via ORAL
  Filled 2017-10-02 (×4): qty 1

## 2017-10-02 MED ORDER — POTASSIUM CHLORIDE CRYS ER 20 MEQ PO TBCR
20.0000 meq | EXTENDED_RELEASE_TABLET | Freq: Two times a day (BID) | ORAL | Status: DC
  Administered 2017-10-02 – 2017-10-04 (×4): 20 meq via ORAL
  Filled 2017-10-02 (×4): qty 1

## 2017-10-02 MED ORDER — ACETAMINOPHEN 325 MG PO TABS
325.0000 mg | ORAL_TABLET | Freq: Four times a day (QID) | ORAL | Status: DC | PRN
  Administered 2017-10-03 – 2017-10-04 (×2): 650 mg via ORAL
  Filled 2017-10-02 (×2): qty 2

## 2017-10-02 MED ORDER — ASPIRIN EC 325 MG PO TBEC: 325.0000 mg | DELAYED_RELEASE_TABLET | Freq: Two times a day (BID) | ORAL | 0 refills | Status: DC

## 2017-10-02 MED ORDER — ACETAMINOPHEN 325 MG PO TABS: 325.0000 mg | ORAL_TABLET | ORAL | Status: DC | PRN

## 2017-10-02 MED ORDER — ONDANSETRON HCL 4 MG PO TABS: 4.0000 mg | ORAL_TABLET | Freq: Four times a day (QID) | ORAL | Status: DC | PRN

## 2017-10-02 MED ORDER — KCL IN DEXTROSE-NACL 20-5-0.45 MEQ/L-%-% IV SOLN
INTRAVENOUS | Status: DC
  Administered 2017-10-02: 19:00:00 via INTRAVENOUS
  Filled 2017-10-02: qty 1000

## 2017-10-02 MED ORDER — PHENOL 1.4 % MT LIQD: 1.0000 | OROMUCOSAL | Status: DC | PRN

## 2017-10-02 MED ORDER — 0.9 % SODIUM CHLORIDE (POUR BTL) OPTIME
TOPICAL | Status: DC | PRN
  Administered 2017-10-02: 1000 mL

## 2017-10-02 MED ORDER — ONDANSETRON HCL 4 MG/2ML IJ SOLN
INTRAMUSCULAR | Status: DC | PRN
  Administered 2017-10-02: 4 mg via INTRAVENOUS

## 2017-10-02 MED ORDER — AMLODIPINE BESYLATE 2.5 MG PO TABS
2.5000 mg | ORAL_TABLET | Freq: Every day | ORAL | Status: DC
  Administered 2017-10-03 – 2017-10-04 (×2): 2.5 mg via ORAL
  Filled 2017-10-02 (×2): qty 1

## 2017-10-02 MED ORDER — PHENYLEPHRINE HCL 10 MG/ML IJ SOLN
INTRAVENOUS | Status: DC | PRN
  Administered 2017-10-02: 50 ug/min via INTRAVENOUS

## 2017-10-02 MED ORDER — TIZANIDINE HCL 2 MG PO TABS: 2.0000 mg | ORAL_TABLET | Freq: Four times a day (QID) | ORAL | 0 refills | Status: DC | PRN

## 2017-10-02 MED ORDER — METHOCARBAMOL 1000 MG/10ML IJ SOLN
500.0000 mg | Freq: Four times a day (QID) | INTRAMUSCULAR | Status: DC | PRN
  Filled 2017-10-02: qty 5

## 2017-10-02 MED ORDER — PANTOPRAZOLE SODIUM 40 MG PO TBEC
40.0000 mg | DELAYED_RELEASE_TABLET | Freq: Every day | ORAL | Status: DC
  Administered 2017-10-02 – 2017-10-04 (×3): 40 mg via ORAL
  Filled 2017-10-02 (×3): qty 1

## 2017-10-02 SURGICAL SUPPLY — 68 items
BLADE SAW SGTL 73X25 THK (BLADE) ×1 IMPLANT
BRUSH FEMORAL CANAL (MISCELLANEOUS) IMPLANT
CABLE ASSY CERCLAGE SST 1.8X55 (Orthopedic Implant) ×1 IMPLANT
COVER SURGICAL LIGHT HANDLE (MISCELLANEOUS) ×2 IMPLANT
DRAPE C-ARM 42X72 X-RAY (DRAPES) IMPLANT
DRAPE HALF SHEET 40X57 (DRAPES) ×2 IMPLANT
DRAPE ORTHO SPLIT 77X108 STRL (DRAPES) ×2
DRAPE SURG ORHT 6 SPLT 77X108 (DRAPES) ×1 IMPLANT
DRAPE U-SHAPE 47X51 STRL (DRAPES) ×2 IMPLANT
DRILL BIT 7/64X5 (BIT) ×2 IMPLANT
DRSG AQUACEL AG ADV 3.5X10 (GAUZE/BANDAGES/DRESSINGS) ×2 IMPLANT
DURAPREP 26ML APPLICATOR (WOUND CARE) ×3 IMPLANT
ELECT BLADE 4.0 EZ CLEAN MEGAD (MISCELLANEOUS) ×2
ELECT BLADE 6.5 EXT (BLADE) ×1 IMPLANT
ELECT REM PT RETURN 9FT ADLT (ELECTROSURGICAL) ×2
ELECTRODE BLDE 4.0 EZ CLN MEGD (MISCELLANEOUS) IMPLANT
ELECTRODE REM PT RTRN 9FT ADLT (ELECTROSURGICAL) ×1 IMPLANT
EVACUATOR 1/8 PVC DRAIN (DRAIN) IMPLANT
GAUZE SPONGE 4X4 12PLY STRL (GAUZE/BANDAGES/DRESSINGS) ×1 IMPLANT
GAUZE XEROFORM 5X9 LF (GAUZE/BANDAGES/DRESSINGS) ×2 IMPLANT
GLOVE BIO SURGEON STRL SZ7.5 (GLOVE) ×2 IMPLANT
GLOVE BIO SURGEON STRL SZ8.5 (GLOVE) ×4 IMPLANT
GLOVE BIOGEL PI IND STRL 8 (GLOVE) ×2 IMPLANT
GLOVE BIOGEL PI IND STRL 9 (GLOVE) ×1 IMPLANT
GLOVE BIOGEL PI INDICATOR 8 (GLOVE) ×2
GLOVE BIOGEL PI INDICATOR 9 (GLOVE) ×1
GOWN STRL REUS W/ TWL LRG LVL3 (GOWN DISPOSABLE) ×1 IMPLANT
GOWN STRL REUS W/ TWL XL LVL3 (GOWN DISPOSABLE) ×2 IMPLANT
GOWN STRL REUS W/TWL LRG LVL3 (GOWN DISPOSABLE) ×6
GOWN STRL REUS W/TWL XL LVL3 (GOWN DISPOSABLE) ×4
HANDPIECE INTERPULSE COAX TIP (DISPOSABLE)
HEAD CERAMIC BIOLOX 36 +3 (Hips) ×1 IMPLANT
HOOD PEEL AWAY FACE SHEILD DIS (HOOD) ×5 IMPLANT
KIT BASIN OR (CUSTOM PROCEDURE TRAY) ×2 IMPLANT
KIT TURNOVER KIT B (KITS) ×2 IMPLANT
LINER NEUTRAL 52X36X54 PLUS 4 (Liner) ×1 IMPLANT
MANIFOLD NEPTUNE II (INSTRUMENTS) ×2 IMPLANT
NEEDLE 22X1 1/2 (OR ONLY) (NEEDLE) ×2 IMPLANT
NS IRRIG 1000ML POUR BTL (IV SOLUTION) ×3 IMPLANT
PACK TOTAL JOINT (CUSTOM PROCEDURE TRAY) ×2 IMPLANT
PAD ARMBOARD 7.5X6 YLW CONV (MISCELLANEOUS) ×4 IMPLANT
PASSER SUT SWANSON 36MM LOOP (INSTRUMENTS) ×1 IMPLANT
PRESSURIZER FEMORAL UNIV (MISCELLANEOUS) IMPLANT
SET HNDPC FAN SPRY TIP SCT (DISPOSABLE) IMPLANT
SLEEVE FEM PROX 18D LRG (Hips) ×1 IMPLANT
SLEEVE SURGEON STRL (DRAPES) IMPLANT
SPONGE LAP 18X18 X RAY DECT (DISPOSABLE) IMPLANT
STAPLER VISISTAT 35W (STAPLE) ×2 IMPLANT
STEM FEM MOD STD 42 13X18X160 (Hips) ×1 IMPLANT
SUT ETHIBOND 2 V 37 (SUTURE) ×3 IMPLANT
SUT VIC AB 0 CTX 36 (SUTURE) ×2
SUT VIC AB 0 CTX36XBRD ANTBCTR (SUTURE) ×1 IMPLANT
SUT VIC AB 1 CTX 36 (SUTURE) ×2
SUT VIC AB 1 CTX36XBRD ANBCTR (SUTURE) ×1 IMPLANT
SUT VIC AB 2-0 CT1 27 (SUTURE) ×2
SUT VIC AB 2-0 CT1 TAPERPNT 27 (SUTURE) ×1 IMPLANT
SUT VIC AB 3-0 CT1 27 (SUTURE) ×2
SUT VIC AB 3-0 CT1 TAPERPNT 27 (SUTURE) ×1 IMPLANT
SWAB COLLECTION DEVICE MRSA (MISCELLANEOUS) ×1 IMPLANT
SWAB CULTURE ESWAB REG 1ML (MISCELLANEOUS) ×1 IMPLANT
SYR 20ML ECCENTRIC (SYRINGE) ×2 IMPLANT
SYR CONTROL 10ML LL (SYRINGE) ×2 IMPLANT
TOWEL OR 17X24 6PK STRL BLUE (TOWEL DISPOSABLE) ×2 IMPLANT
TOWEL OR 17X26 10 PK STRL BLUE (TOWEL DISPOSABLE) ×2 IMPLANT
TOWER CARTRIDGE SMART MIX (DISPOSABLE) IMPLANT
TRAY CATH 16FR W/PLASTIC CATH (SET/KITS/TRAYS/PACK) ×1 IMPLANT
TRAY FOLEY W/BAG SLVR 14FR (SET/KITS/TRAYS/PACK) IMPLANT
WATER STERILE IRR 1000ML POUR (IV SOLUTION) ×3 IMPLANT

## 2017-10-02 NOTE — Anesthesia Postprocedure Evaluation (Signed)
Anesthesia Post Note  Patient: [name]  Procedure(s) Performed: TOTAL HIP REVISION POSTERIOR (Right Hip)     Patient location during evaluation: PACU Anesthesia Type: MAC Level of consciousness: oriented and awake and alert Pain management: pain level controlled Vital Signs Assessment: post-procedure vital signs reviewed and stable Respiratory status: spontaneous breathing, respiratory function stable and patient connected to nasal cannula oxygen Cardiovascular status: blood pressure returned to baseline and stable Postop Assessment: no headache, no backache and no apparent nausea or vomiting Anesthetic complications: no    Last Vitals:  Vitals:   10/02/17 0936 10/02/17 1648  BP: (!) 159/81   Pulse: 76   Resp: 20   Temp: 36.5 C 36.4 C  SpO2: 100%     Last Pain:  Vitals:   10/02/17 1648  TempSrc:   PainSc: 0-No pain                 Nelly Scriven,W. EDMOND

## 2017-10-02 NOTE — Anesthesia Preprocedure Evaluation (Addendum)
Anesthesia Evaluation  Patient identified by MRN, date of birth, ID band Patient awake    Reviewed: Allergy & Precautions, NPO status , Patient's Chart, lab work & pertinent test results  Airway Mallampati: II  TM Distance: >3 FB Neck ROM: Full    Dental  (+) Edentulous Upper, Edentulous Lower   Pulmonary    breath sounds clear to auscultation       Cardiovascular hypertension, Pt. on medications  Rhythm:Regular Rate:Normal     Neuro/Psych    GI/Hepatic negative GI ROS, Neg liver ROS,   Endo/Other  negative endocrine ROS  Renal/GU negative Renal ROS     Musculoskeletal  (+) Arthritis , Osteoarthritis,    Abdominal (+) + obese,   Peds  Hematology negative hematology ROS (+)   Anesthesia Other Findings   Reproductive/Obstetrics                            Anesthesia Physical  Anesthesia Plan  ASA: III  Anesthesia Plan: MAC and Spinal   Post-op Pain Management:    Induction: Intravenous  PONV Risk Score and Plan: 2 and Treatment may vary due to age or medical condition  Airway Management Planned: Natural Airway, Nasal Cannula and Simple Face Mask  Additional Equipment:   Intra-op Plan:   Post-operative Plan:   Informed Consent: I have reviewed the patients History and Physical, chart, labs and discussed the procedure including the risks, benefits and alternatives for the proposed anesthesia with the patient or authorized representative who has indicated his/her understanding and acceptance.     Plan Discussed with: CRNA, Anesthesiologist and Surgeon  Anesthesia Plan Comments: (  )       Anesthesia Quick Evaluation

## 2017-10-02 NOTE — Op Note (Signed)
PATIENT ID:      Angela Estrada  MRN:     [id number] DOB/AGE:    [birth year] / 67 y.o.       OPERATIVE REPORT    DATE OF PROCEDURE:  10/02/2017       PREOPERATIVE DIAGNOSIS:  LOOSE RIGHT TOTAL HIP ARTHROPLASTY                                                       Estimated body mass index is 37.73 kg/m as calculated from the following:   Height as of 09/20/17: 5\' 3"  (1.6 m).   Weight as of this encounter: 213 lb (96.6 kg).     POSTOPERATIVE DIAGNOSIS:  LOOSE RIGHT TOTAL HIP ARTHROPLASTY                                                           PROCEDURE: Revision right total hip arthroplasty with removal of a grossly loose to 3 Tri-Lock stem, revision of the 32 mm polyethylene liner up to a 36 mm 10 degree polyethylene liner index posterior superior and revision of the femoral head from a +0 32 mm ceramic head to a +336 mm ceramic head after removal of the stem we revised to an S-ROM 18 x 13 x 1 50 x 42 stem with a 18 D large sleeve . SURGEON: Kerin Salen    ASSISTANT:   Kerry Hough. [employer]  (present throughout entire procedure and necessary for timely completion of the procedure)  ANESTHESIA: Spinal BLOOD LOSS: 400 cc FLUID REPLACEMENT: 1600 cc crystalloid Tranexamic Acid: 1 g IV and 2 g topical DRAINS: None COMPLICATIONS: None    INDICATIONS FOR PROCEDURE: Patient had primary right total hip arthroplasty by me 2 years ago over the last 4 to 5 months has developed increasing pain radiographs show evidence of loosening of the stem.  There are lucent lines that are parallel and the stem is subsided about 5 or 6 mm.  The pain is increased markedly in last month.  She desires revision of the right total hip stem that was placed from an anterior approach.  Her body weight which was 180 when she had the primary done is now 215.  The risks, benefits, and alternatives were discussed at length including but not limited to the risks of infection, bleeding, nerve injury, stiffness, blood  clots, the need for revision surgery, cardiopulmonary complications, among others, and they were willing to proceed.Benefits have been discussed. Questions answered.     PROCEDURE IN DETAIL: The patient was identified by armband,  received preoperative IV antibiotics in the holding area at [facility], taken to the operating room , appropriate anesthetic monitors  were attached and general endotracheal anesthesia induced. Foley catheter was inserted. Patient was rolled into the left lateral decubitus position and fixed there with a Stulberg Mark II pelvic clamp and the right lower extremity was then prepped and draped  in the usual sterile fashion from the ankle to the hemipelvis. A time-out  procedure was performed. The skin along the lateral hip and thigh  infiltrated with 10 mL of Exparel and 0.5% Marcaine  and epinephrine solution. We  then made a posterolateral approach to the hip. With a #10 blade, 18 cm  incision through skin and subcutaneous tissue down to the level of the  IT band. Small bleeders were identified and cauterized. IT band cut in  line with skin incision exposing the greater trochanter. A Cobra retractor was placed between the gluteus minimus and the superior hip joint capsule, and a spiked Cobra between the quadratus femoris and the inferior hip joint capsule. This isolated the short  external rotators and piriformis tendons. These were tagged with a #2 Ethibond  suture and cut off their insertion on the intertrochanteric crest. The posterior  capsule was then developed into an acetabular-based flap from Posterior Superior off of the acetabulum out over the femoral neck and back posterior inferior to the acetabular rim. This flap was tagged with two #2 Ethibond sutures and retracted protecting the sciatic nerve.  We had exposure of the acetabular component which was found to be stable.  The hip was then flexed and internally rotated dislocating the ceramic ball which was  removed with a metal cylinder and a mallet exposing the trunnion of the grossly loose femoral component.  Using some thin osteotomes were thoroughly around the proximal aspect of the femoral component and the room removed it using the snare and slaphammer.  Because this was a posterior approach and organ to be upsizing the ceramic ball the acetabular component +4 polyethylene liner was removed using the removal tool and replaced with a +410 degree liner index posterior superior to accept a 36 mm ball. The hip was then flexed and internally rotated exposing the  proximal femur.  We then proceeded with axial reaming up to 13.5 mm full depth, and 14 partial depth. We then conically reamed up to 18 delta, and milled the calcar to 18 delta large.  At that point we noticed that there was a minor proximal crack in the calcar revealed by the calcar reaming.  We then proceeded to place a single 1.8 mm Zimmer cable at the level of the lesser trochanter obtaining good compression of the proximal femur.  We then hammered into place a trial 18 D large sleeve followed by a trial stem with an extra 20 degrees of anteversion dialed into the stem to compensate for the retroversion occurred that occurred as a stem came loose.  We performed trials with a +0 and a +3x all trial components removed, 36 mm ball and found the best stability with a +3 ball with flexion of 90 degrees and internal rotation of 70 degrees.  The real 36 D large sleeve was hammered into place with good fit followed by an 18 x 13 x 1 50 x 42 SROM stem with 20 degrees of anteversion in relation to the calcar.  This is followed by +3 x 36 mm ceramic head. The hip was reduced. We checked our stability  one more time and found to be excellent. The wound was once again  thoroughly irrigated out with normal saline solution pulse lavage. The  capsular flap and short external rotators were repaired back to the  intertrochanteric crest through drill holes with a #2  Ethibond suture.  Exparel was then infiltrated into the deep and superficial soft tissues. The IT band was closed with running 1 Vicryl suture. The subcutaneous  tissue with 0 and 2-0 undyed Vicryl suture and the skin with running  3-0 Vivryl SQ suture. Aquacil dessing was applied. The patient was then unclamped,  rolled supine, awaken extubated and taken to recovery room without difficulty in stable condition.  Patient will be weightbearing as tolerated.   Kerin Salen 10/02/2017, 3:53 PM

## 2017-10-02 NOTE — Discharge Instructions (Signed)

## 2017-10-02 NOTE — Anesthesia Procedure Notes (Signed)
Procedure Name: MAC Date/Time: 10/02/2017 1:45 PM Performed by: Candis Shine, CRNA Pre-anesthesia Checklist: Patient identified, Emergency Drugs available, Suction available, Patient being monitored and Timeout performed Patient Re-evaluated:Patient Re-evaluated prior to induction Oxygen Delivery Method: Simple face mask Dental Injury: Teeth and Oropharynx as per pre-operative assessment

## 2017-10-02 NOTE — Interval H&P Note (Signed)
History and Physical Interval Note:  10/02/2017 11:42 AM  Angela Estrada  has presented today for surgery, with the diagnosis of LOOSE RIGHT TOTAL HIP ARTHROPLASTY  The various methods of treatment have been discussed with the patient and family. After consideration of risks, benefits and other options for treatment, the patient has consented to  Procedure(s): TOTAL HIP REVISION POSTERIOR (Right) as a surgical intervention .  The patient's history has been reviewed, patient examined, no change in status, stable for surgery.  I have reviewed the patient's chart and labs.  Questions were answered to the patient's satisfaction.     Kerin Salen

## 2017-10-02 NOTE — Anesthesia Procedure Notes (Signed)
Spinal  Patient location during procedure: OR Start time: 10/02/2017 1:41 PM End time: 10/02/2017 1:45 PM Staffing Anesthesiologist: Lyn Hollingshead, MD Performed: anesthesiologist  Preanesthetic Checklist Completed: patient identified, site marked, surgical consent, pre-op evaluation, timeout performed, IV checked, risks and benefits discussed and monitors and equipment checked Spinal Block Patient position: sitting Prep: site prepped and draped and DuraPrep Patient monitoring: continuous pulse ox and blood pressure Approach: midline Location: L4-5 Needle Needle type: Pencan  Needle gauge: 24 G Needle length: 10 cm Needle insertion depth: 7 cm Assessment Sensory level: T8

## 2017-10-02 NOTE — Transfer of Care (Signed)
Immediate Anesthesia Transfer of Care Note  Patient: Angela Estrada  Procedure(s) Performed: TOTAL HIP REVISION POSTERIOR (Right Hip)  Patient Location: PACU  Anesthesia Type:MAC and Spinal  Level of Consciousness: awake, alert  and oriented  Airway & Oxygen Therapy: Patient Spontanous Breathing and Patient connected to face mask oxygen  Post-op Assessment: Report given to RN and Post -op Vital signs reviewed and stable  Post vital signs: Reviewed and stable  Last Vitals:  Vitals Value Taken Time  BP 129/60 10/02/2017  4:47 PM  Temp    Pulse 76 10/02/2017  4:48 PM  Resp 18 10/02/2017  4:48 PM  SpO2 100 % 10/02/2017  4:48 PM  Vitals shown include unvalidated device data.  Last Pain:  Vitals:   10/02/17 0936  TempSrc: Oral         Complications: No apparent anesthesia complications

## 2017-10-03 ENCOUNTER — Encounter (HOSPITAL_COMMUNITY): Payer: Self-pay | Admitting: Orthopedic Surgery

## 2017-10-03 LAB — BASIC METABOLIC PANEL
Anion gap: 8 (ref 5–15)
BUN: 9 mg/dL (ref 6–20)
CO2: 23 mmol/L (ref 22–32)
Calcium: 9.1 mg/dL (ref 8.9–10.3)
Chloride: 105 mmol/L (ref 101–111)
Creatinine, Ser: 0.69 mg/dL (ref 0.44–1.00)
GFR calc Af Amer: >60 mL/min (ref >60)
GFR calc non Af Amer: >60 mL/min (ref >60)
Glucose, Bld: 143 mg/dL — ABNORMAL HIGH (ref 65–99)
Potassium: 4.4 mmol/L (ref 3.5–5.1)
Sodium: 136 mmol/L (ref 135–145)

## 2017-10-03 LAB — CBC
HCT: 37.5 % (ref 36.0–46.0)
Hemoglobin: 12.7 g/dL (ref 12.0–15.0)
MCH: 30.2 pg (ref 26.0–34.0)
MCHC: 33.9 g/dL (ref 30.0–36.0)
MCV: 89.3 fL (ref 78.0–100.0)
Platelets: 247 10*3/uL (ref 150–400)
RBC: 4.2 MIL/uL (ref 3.87–5.11)
RDW: 13.4 % (ref 11.5–15.5)
WBC: 12.7 10*3/uL — ABNORMAL HIGH (ref 4.0–10.5)

## 2017-10-03 NOTE — Progress Notes (Signed)
Orthopedic Tech Progress Note Patient Details:  Angela Estrada 1950-07-02 [id number]  Patient ID: [name], female   DOB: 12-17-1950, 67 y.o.   MRN: [id number]   [place] 10/03/2017, 9:39 AMTrapeze bar temporarily discontinued.

## 2017-10-03 NOTE — Evaluation (Signed)
Physical Therapy Evaluation Patient Details Name: Angela Estrada MRN: [id number] DOB: 1950/06/15 Today's Date: 10/03/2017   History of Present Illness  67 y.o. female admitted on 10/02/17 for elective R posterior THA revision.  Pt with significant PMH of HTN, R THA 2017, and bil TKA (2010).  Clinical Impression  Pt is POD #1 and is moving well, min assist overall with RW in the room.  We reviewed posterior precautions and HEP program handout given.  We will attempt to walk to the hallway this PM.   PT to follow acutely for deficits listed below.       Follow Up Recommendations Follow surgeon's recommendation for DC plan and follow-up therapies;Supervision for mobility/OOB    Equipment Recommendations  Rolling walker with 5" wheels    Recommendations for Other Services   NA    Precautions / Restrictions Precautions Precautions: Posterior Hip;Fall Precaution Booklet Issued: Yes (comment) Precaution Comments: posterior hip precautions reviewed and hangout given, exercise handout given as well. Restrictions Weight Bearing Restrictions: Yes RLE Weight Bearing: Weight bearing as tolerated      Mobility  Bed Mobility Overal bed mobility: Needs Assistance Bed Mobility: Supine to Sit     Supine to sit: Min assist     General bed mobility comments: Min assist to help progress right leg over EOB.   Transfers Overall transfer level: Needs assistance Equipment used: Rolling walker (2 wheeled) Transfers: Sit to/from Stand Sit to Stand: Min assist;From elevated surface         General transfer comment: Min assist from elevated bed.  Cues for safe hand placement and WBAT status.  Cues for slow lowering to lower recliner chair with r leg kicked out.   Ambulation/Gait Ambulation/Gait assistance: Min assist Ambulation Distance (Feet): 8 Feet Assistive device: Rolling walker (2 wheeled) Gait Pattern/deviations: Step-to pattern;Antalgic Gait velocity: decreased   General Gait  Details: Pt with moderately antalgic gait pattern, verbal cues for correct LE sequencing and safe RW use.          Balance Overall balance assessment: Needs assistance Sitting-balance support: Feet supported;Bilateral upper extremity supported Sitting balance-Leahy Scale: Fair     Standing balance support: Bilateral upper extremity supported Standing balance-Leahy Scale: Poor                               Pertinent Vitals/Pain Pain Assessment: 0-10 Pain Score: 6  Pain Location: right leg Pain Descriptors / Indicators: Aching;Burning Pain Intervention(s): Limited activity within patient's tolerance;Monitored during session;Repositioned    Home Living Family/patient expects to be discharged to:: Private residence Living Arrangements: Spouse/significant other Available Help at Discharge: Family;Available 24 hours/day Type of Home: House Home Access: Stairs to enter Entrance Stairs-Rails: Psychiatric nurse of Steps: 4 Home Layout: One level Home Equipment: Cane - single point;Bedside commode      Prior Function Level of Independence: Needs assistance                  Extremity/Trunk Assessment   Upper Extremity Assessment Upper Extremity Assessment: Defer to OT evaluation    Lower Extremity Assessment Lower Extremity Assessment: RLE deficits/detail RLE Deficits / Details: right leg with normal post op pain and weakness, ankle at least 3/5, knee 3-/5, hip 2/5    Cervical / Trunk Assessment Cervical / Trunk Assessment: Normal  Communication   Communication: No difficulties  Cognition Arousal/Alertness: Awake/alert Behavior During Therapy: WFL for tasks assessed/performed Overall Cognitive Status: Within Functional Limits for  tasks assessed                                           Exercises Total Joint Exercises Ankle Circles/Pumps: AROM;Left;20 reps   Assessment/Plan    PT Assessment Patient needs  continued PT services  PT Problem List Decreased strength;Decreased range of motion;Decreased activity tolerance;Decreased balance;Decreased mobility;Decreased knowledge of use of DME;Decreased knowledge of precautions;Pain       PT Treatment Interventions DME instruction;Stair training;Gait training;Functional mobility training;Therapeutic activities;Therapeutic exercise;Balance training;Patient/family education;Manual techniques;Modalities    PT Goals (Current goals can be found in the Care Plan section)  Acute Rehab PT Goals Patient Stated Goal: to get her hip "right" PT Goal Formulation: With patient Time For Goal Achievement: 10/10/17 Potential to Achieve Goals: Good    Frequency 7X/week           AM-PAC PT "6 Clicks" Daily Activity  Outcome Measure Difficulty turning over in bed (including adjusting bedclothes, sheets and blankets)?: Unable Difficulty moving from lying on back to sitting on the side of the bed? : Unable Difficulty sitting down on and standing up from a chair with arms (e.g., wheelchair, bedside commode, etc,.)?: Unable Help needed moving to and from a bed to chair (including a wheelchair)?: A Little Help needed walking in hospital room?: A Little Help needed climbing 3-5 steps with a railing? : A Lot 6 Click Score: 11    End of Session   Activity Tolerance: Patient limited by pain Patient left: in chair;with call bell/phone within reach   PT Visit Diagnosis: Muscle weakness (generalized) (M62.81);Difficulty in walking, not elsewhere classified (R26.2);Pain Pain - Right/Left: Right Pain - part of body: Hip    Time: 9509-3267 PT Time Calculation (min) (ACUTE ONLY): 13 min   Charges:        Wells Guiles B. Charlese Gruetzmacher, PT, DPT [phone]   PT Evaluation $PT Eval Moderate Complexity: 1 Mod     10/03/2017, 3:51 PM

## 2017-10-03 NOTE — Progress Notes (Signed)
Physical Therapy Treatment Patient Details Name: Angela Estrada MRN: [id number] DOB: 1951-01-18 Today's Date: 10/03/2017    History of Present Illness 67 y.o. female admitted on 10/02/17 for elective R posterior THA revision.  Pt with significant PMH of HTN, R THA 2017, and bil TKA (2010).    PT Comments    Pt was able to progress gait with min guard assist significantly down the hallway this PM.  She reviewed a good portion of her HEP and we verbally and functionally reviewed her hip precautions.   PT to follow acutely to progress safe gait and mobility.  She will need to practice stairs and standing exercises before d/c home.   Follow Up Recommendations  Follow surgeon's recommendation for DC plan and follow-up therapies;Supervision for mobility/OOB     Equipment Recommendations  Rolling walker with 5" wheels    Recommendations for Other Services   NA     Precautions / Restrictions Precautions Precautions: Posterior Hip;Fall Precaution Booklet Issued: Yes (comment) Precaution Comments: posterior hip precautions reviewed and hangout given, exercise handout given as well. Restrictions Weight Bearing Restrictions: Yes RLE Weight Bearing: Weight bearing as tolerated    Mobility  Bed Mobility Overal bed mobility: Needs Assistance Bed Mobility: Supine to Sit     Supine to sit: Min assist     General bed mobility comments: Min assist to help progress right leg over EOB.   Transfers Overall transfer level: Needs assistance Equipment used: Rolling walker (2 wheeled) Transfers: Sit to/from Stand Sit to Stand: Min assist;From elevated surface         General transfer comment: Min assist from elevated bed.  Cues for safe hand placement and WBAT status.  Cues for slow lowering to lower recliner chair with r leg kicked out.   Ambulation/Gait Ambulation/Gait assistance: Min assist Ambulation Distance (Feet): 85 Feet Assistive device: Rolling walker (2 wheeled) Gait  Pattern/deviations: Antalgic;Step-through pattern Gait velocity: decreased   General Gait Details: Pt with moderately antalgic gait pattern, verbal cues for correct LE sequencing and safe RW use.           Balance Overall balance assessment: Needs assistance Sitting-balance support: Feet supported;Bilateral upper extremity supported Sitting balance-Leahy Scale: Fair     Standing balance support: Bilateral upper extremity supported Standing balance-Leahy Scale: Poor                              Cognition Arousal/Alertness: Awake/alert Behavior During Therapy: WFL for tasks assessed/performed Overall Cognitive Status: Within Functional Limits for tasks assessed                                        Exercises Total Joint Exercises Ankle Circles/Pumps: AROM;Left;20 reps Quad Sets: AROM;Both;10 reps Heel Slides: AAROM;Right;10 reps Long Arc Quad: AROM;Right;10 reps        Pertinent Vitals/Pain Pain Assessment: 0-10 Pain Score: 6  Pain Location: right leg Pain Descriptors / Indicators: Aching;Burning Pain Intervention(s): Limited activity within patient's tolerance;Monitored during session;Repositioned    Home Living Family/patient expects to be discharged to:: Private residence Living Arrangements: Spouse/significant other Available Help at Discharge: Family;Available 24 hours/day Type of Home: House Home Access: Stairs to enter Entrance Stairs-Rails: Right;Left Home Layout: One level Home Equipment: Cane - single point;Bedside commode      Prior Function Level of Independence: Needs assistance  PT Goals (current goals can now be found in the care plan section) Acute Rehab PT Goals Patient Stated Goal: to get her hip "right" PT Goal Formulation: With patient Time For Goal Achievement: 10/10/17 Potential to Achieve Goals: Good Progress towards PT goals: Progressing toward goals    Frequency    7X/week      PT  Plan Current plan remains appropriate       AM-PAC PT "6 Clicks" Daily Activity  Outcome Measure  Difficulty turning over in bed (including adjusting bedclothes, sheets and blankets)?: Unable Difficulty moving from lying on back to sitting on the side of the bed? : Unable Difficulty sitting down on and standing up from a chair with arms (e.g., wheelchair, bedside commode, etc,.)?: Unable Help needed moving to and from a bed to chair (including a wheelchair)?: A Little Help needed walking in hospital room?: A Little Help needed climbing 3-5 steps with a railing? : A Lot 6 Click Score: 11    End of Session   Activity Tolerance: Patient limited by pain Patient left: in chair;with call bell/phone within reach;with family/visitor present   PT Visit Diagnosis: Muscle weakness (generalized) (M62.81);Difficulty in walking, not elsewhere classified (R26.2);Pain Pain - Right/Left: Right Pain - part of body: Hip     Time: 5747-3403 PT Time Calculation (min) (ACUTE ONLY): 23 min  Charges:  $Gait Training: 8-22 mins $Therapeutic Exercise: 8-22 mins          Ciani Rutten B. [place], [place]illi[place], DPT [phone]            10/03/2017, 4:30 PM

## 2017-10-03 NOTE — Progress Notes (Signed)
PATIENT ID: OKSANA DEBERRY  MRN: [id number]  DOB/AGE:  09/14/1950 / 67 y.o.  1 Day Post-Op Procedure(s) (LRB): TOTAL HIP REVISION POSTERIOR (Right)    PROGRESS NOTE Subjective: Patient is alert, oriented, no Nausea, no Vomiting, yes passing gas, . Taking PO well. Denies SOB, Chest or Calf Pain. Using Incentive Spirometer, PAS in place. Ambulate WBAT Patient reports pain as  3/10  .    Objective: Vital signs in last 24 hours: Vitals:   10/02/17 1809 10/02/17 1949 10/03/17 0003 10/03/17 0427  BP: 115/66 (Abnormal) 155/80 (Abnormal) 150/72 124/70  Pulse: 76 79 85 70  Resp: 18     Temp: 97.9 F (36.6 C) (Abnormal) 97.5 F (36.4 C) 98.1 F (36.7 C) 98.3 F (36.8 C)  TempSrc: Oral Oral Oral Oral  SpO2: 98% 97% 97% 98%  Weight: 215 lb 9.8 oz (97.8 kg)     Height: 5\' 3"  (1.6 m)         Intake/Output from previous day: I/O last 3 completed shifts: In: 1000 [I.V.:1000] Out: 1150 [Urine:1000; Blood:150]   Intake/Output this shift: No intake/output data recorded.   LABORATORY DATA: Recent Labs    09/30/17 1333 10/02/17 0953 10/03/17 0602  WBC  --   --  12.7*  HGB  --  14.3 12.7  HCT  --  42.0 37.5  PLT  --   --  247  NA 138 140 136  K 4.0 3.7 4.4  CL 100  --  105  CO2 29  --  23  BUN 18  --  9  CREATININE 0.79  --  0.69  GLUCOSE 101* 114* 143*  CALCIUM 9.9  --  9.1    Examination: Neurologically intact ABD soft Neurovascular intact Sensation intact distally Intact pulses distally Dorsiflexion/Plantar flexion intact Incision: dressing C/D/I and no drainage No cellulitis present Compartment soft} XR AP&Lat of hip shows well placed\fixed THA  Assessment:   1 Day Post-Op Procedure(s) (LRB): TOTAL HIP REVISION POSTERIOR (Right) ADDITIONAL DIAGNOSIS:  Expected Acute Blood Loss Anemia, Hypertension  Plan: PT/OT WBAT, THA  DVT Prophylaxis: SCDx72 hrs, ASA 325 mg BID x 2 weeks  DISCHARGE PLAN: Home, when passes PT  DISCHARGE NEEDS: HHPT, Walker and  3-in-1 comode seat

## 2017-10-04 LAB — CBC
HCT: 35 % — ABNORMAL LOW (ref 36.0–46.0)
Hemoglobin: 11.7 g/dL — ABNORMAL LOW (ref 12.0–15.0)
MCH: 30.2 pg (ref 26.0–34.0)
MCHC: 33.4 g/dL (ref 30.0–36.0)
MCV: 90.2 fL (ref 78.0–100.0)
Platelets: 256 10*3/uL (ref 150–400)
RBC: 3.88 MIL/uL (ref 3.87–5.11)
RDW: 13.9 % (ref 11.5–15.5)
WBC: 14.2 10*3/uL — ABNORMAL HIGH (ref 4.0–10.5)

## 2017-10-04 NOTE — Discharge Summary (Signed)
Patient ID: Angela Estrada MRN: [id number] DOB/AGE: [birth year] 65 y.o.  Admit date: 10/02/2017 Discharge date: 10/04/2017  Admission Diagnoses:  Principal Problem:   Failed total hip arthroplasty [facility]) Active Problems:   Primary osteoarthritis of right hip   Discharge Diagnoses:  Same  Past Medical History:  Diagnosis Date  . Allergy   . Arthritis   . Arthritis   . HYPERCHOLESTEROLEMIA 04/25/2007   takes Pravastatin daily  . HYPERTENSION 01/03/2007   takes Losartan-HCTZ  and Amlodipine daily  . Hypokalemia    takes Potassium daily  . Joint pain   . Nocturia   . Pneumonia 2 yrs ago    Surgeries: Procedure(s): TOTAL HIP REVISION POSTERIOR on 10/02/2017   Consultants:   Discharged Condition: Improved  Hospital Course: Angela Estrada is an 67 y.o. female who was admitted 10/02/2017 for operative treatment ofFailed total hip arthroplasty ([place]). Patient has severe unremitting pain that affects sleep, daily activities, and work/hobbies. After pre-op clearance the patient was taken to the operating room on 10/02/2017 and underwent  Procedure(s): TOTAL HIP REVISION POSTERIOR.    Patient was given perioperative antibiotics:  Anti-infectives (From admission, onward)   Start     Dose/Rate Route Frequency Ordered Stop   10/02/17 1100  ceFAZolin (ANCEF) IVPB 2g/100 mL premix     2 g 200 mL/hr over 30 Minutes Intravenous To [facility] 10/01/17 0942 10/02/17 1346       Patient was given sequential compression devices, early ambulation, and chemoprophylaxis to prevent DVT.  Patient benefited maximally from hospital stay and there were no complications.    Recent vital signs:  Patient Vitals for the past 24 hrs:  BP Temp Temp src Pulse Resp SpO2  10/04/17 0659 122/65 98 F (36.7 C) Oral 70 15 99 %  10/04/17 0019 120/67 97.8 F (36.6 C) Oral 69 15 99 %  10/03/17 1951 130/67 98.6 F (37 C) Oral 75 16 100 %  10/03/17 1506 127/69 97.6 F (36.4 C) Oral 84 17 97  %     Recent laboratory studies:  Recent Labs    10/02/17 0953 10/03/17 0602 10/04/17 0620  WBC  --  12.7* 14.2*  HGB 14.3 12.7 11.7*  HCT 42.0 37.5 35.0*  PLT  --  247 256  NA 140 136  --   K 3.7 4.4  --   CL  --  105  --   CO2  --  23  --   BUN  --  9  --   CREATININE  --  0.69  --   GLUCOSE 114* 143*  --   CALCIUM  --  9.1  --      Discharge Medications:   Allergies as of 10/04/2017      Reactions   Lisinopril Cough   Oxycodone Other (See Comments)   Hallucinations      Medication List    STOP taking these medications   ibuprofen 200 MG tablet Commonly known as:  ADVIL,MOTRIN     TAKE these medications   amLODipine 2.5 MG tablet Commonly known as:  NORVASC Take 1 tablet (2.5 mg total) by mouth daily.   aspirin EC 325 MG tablet Take 1 tablet (325 mg total) by mouth 2 (two) times daily. What changed:  Another medication with the same name was removed. Continue taking this medication, and follow the directions you see here.   diclofenac sodium 1 % Gel Commonly known as:  VOLTAREN APPLY 2 GRAM(S) 2 TO 3 TIMES A DAY  AS NEEDED FOR PAIN   HYDROmorphone 2 MG tablet Commonly known as:  DILAUDID Take 1 tablet (2 mg total) by mouth every 4 (four) hours as needed for severe pain.   LUBRICANT EYE DROPS OP Place 1 drop into both eyes daily as needed (allergies).   mometasone 50 MCG/ACT nasal spray Commonly known as:  NASONEX Place 2 sprays into the nose daily as needed. As needed for nasal allergies What changed:    reasons to take this  additional instructions   multivitamin with minerals Tabs tablet Take 1 tablet by mouth daily.   potassium chloride SA 20 MEQ tablet Commonly known as:  K-DUR,KLOR-CON Take 1 tablet (20 mEq total) by mouth 2 (two) times daily.   pravastatin 20 MG tablet Commonly known as:  PRAVACHOL Take 1 tablet (20 mg total) by mouth daily. What changed:  when to take this   telmisartan-hydrochlorothiazide 80-25 MG  tablet Commonly known as:  MICARDIS HCT Take 1 tablet by mouth daily.   tiZANidine 2 MG tablet Commonly known as:  ZANAFLEX Take 1 tablet (2 mg total) by mouth every 6 (six) hours as needed.            Durable Medical Equipment  (From admission, onward)        Start     Ordered   10/02/17 1834  DME Walker rolling  Once    Question:  Patient needs a walker to treat with the following condition  Answer:  Status post right hip replacement   10/02/17 1833   10/02/17 1834  DME 3 n 1  Once     10/02/17 1833       Discharge Care Instructions  (From admission, onward)        Start     Ordered   10/04/17 0000  Weight bearing as tolerated     10/04/17 1025      Diagnostic Studies: Dg Chest 2 View  Result Date: 09/20/2017 CLINICAL DATA:  Pre-op exam, hip replacement surgery. Hx of hypertension, pneumonia. No surgery to chest. Nonsmoker. Pt unable to raise right arm above head. EXAM: CHEST - 2 VIEW COMPARISON:  Chest x-ray dated 03/09/2016. FINDINGS: The heart size and mediastinal contours are within normal limits. Both lungs are clear. Mild degenerative spurring within the thoracic spine. IMPRESSION: No active cardiopulmonary disease. Electronically Signed   By: Franki Cabot M.D.   On: 09/20/2017 10:38   Dg Hip Port Unilat With Pelvis 1v Right  Result Date: 10/02/2017 CLINICAL DATA:  Postop right hip revision EXAM: DG HIP (WITH OR WITHOUT PELVIS) 1V PORT RIGHT COMPARISON:  03/19/2016 FINDINGS: Right hip arthroplasty revision performed. Expected postoperative changes. Components appear aligned. No complicating feature, osseous or hardware abnormality. Air throughout the soft tissues about the right hip joint. Normal bowel gas pattern. IMPRESSION: Expected appearance status post right total hip arthroplasty revision. Electronically Signed   By: Jerilynn Mages.  Shick M.D.   On: 10/02/2017 19:10    Disposition: Discharge disposition: 01-Home or Self Care       Discharge Instructions     Call MD / Call 911   Complete by:  As directed    If you experience chest pain or shortness of breath, CALL 911 and be transported to the hospital emergency room.  If you develope a fever above 101 F, pus (white drainage) or increased drainage or redness at the wound, or calf pain, call your surgeon's office.   Constipation Prevention   Complete by:  As directed  Drink plenty of fluids.  Prune juice may be helpful.  You may use a stool softener, such as Colace (over the counter) 100 mg twice a day.  Use MiraLax (over the counter) for constipation as needed.   Diet - low sodium heart healthy   Complete by:  As directed    Driving restrictions   Complete by:  As directed    No driving for 2 weeks   Follow the hip precautions as taught in Physical Therapy   Complete by:  As directed    Increase activity slowly as tolerated   Complete by:  As directed    Patient may shower   Complete by:  As directed    You may shower without a dressing once there is no drainage.  Do not wash over the wound.  If drainage remains, cover wound with plastic wrap and then shower.   Weight bearing as tolerated   Complete by:  As directed       Follow-up Information    Frederik Pear, MD In 2 weeks.   Specialty:  Orthopedic Surgery Contact information: [place] [id number] [phone]            Signed: Joanell Rising 10/04/2017, 10:25 AM

## 2017-10-04 NOTE — Progress Notes (Signed)
Pt and husband were given all discharge instructions and questions were answered.  Pt verbalizes understanding of all.  Pt is aware of prescriptions and where to pick up meds and also aware of return f/u appt with md.  Pt is discharged home in no pain with husband and walker.

## 2017-10-04 NOTE — Progress Notes (Signed)
Physical Therapy Treatment Patient Details Name: Angela Estrada MRN: [id number] DOB: 1950-06-14 Today's Date: 10/04/2017    History of Present Illness 67 y.o. female admitted on 10/02/17 for elective R posterior THA revision.  Pt with significant PMH of HTN, R THA 2017, and bil TKA (2010).    PT Comments    Pt was able to practice stairs simulating home entry, walk the unit with RW and min guard assist, and finish reviewing her HEP program.  From a mobility standpoint, she is safe to d/c home with her husband.  PT to follow acutely until d/c confirmed.      Follow Up Recommendations  Follow surgeon's recommendation for DC plan and follow-up therapies;Supervision for mobility/OOB     Equipment Recommendations  Rolling walker with 5" wheels    Recommendations for Other Services   NA     Precautions / Restrictions Precautions Precautions: Posterior Hip;Fall Precaution Booklet Issued: Yes (comment) Restrictions RLE Weight Bearing: Weight bearing as tolerated    Mobility  Bed Mobility               General bed mobility comments: Pt was already up in the recliner chair.  Transfers Overall transfer level: Needs assistance Equipment used: Rolling walker (2 wheeled) Transfers: Sit to/from Stand Sit to Stand: Min assist         General transfer comment: Min assist to support trunk for balance during transition of hands.   Ambulation/Gait Ambulation/Gait assistance: Min guard Ambulation Distance (Feet): 120 Feet Assistive device: Rolling walker (2 wheeled) Gait Pattern/deviations: Antalgic;Step-through pattern;Trunk flexed Gait velocity: improved today vs yesterday   General Gait Details: Verbal cues for upright posture, min guard assist for safety.    Stairs Stairs: Yes Stairs assistance: Min guard Stair Management: One rail Right;One rail Left;Step to pattern;Forwards Number of Stairs: 2 General stair comments: Verbal cues for correct LE sequencing, min  guard assist for safety on the stairs.  Husband reports he helps her normally and she can only reach one rail at a time.           Balance Overall balance assessment: Needs assistance Sitting-balance support: Feet supported;No upper extremity supported Sitting balance-Leahy Scale: Good     Standing balance support: Bilateral upper extremity supported Standing balance-Leahy Scale: Poor                              Cognition Arousal/Alertness: Awake/alert Behavior During Therapy: WFL for tasks assessed/performed Overall Cognitive Status: Within Functional Limits for tasks assessed                                        Exercises Total Joint Exercises Hip ABduction/ADduction: AROM;Right;10 reps;Standing Knee Flexion: AROM;Right;10 reps;Standing Marching in Standing: AROM;Right;10 reps;Standing Standing Hip Extension: AROM;Right;10 reps;Standing        Pertinent Vitals/Pain Pain Assessment: Faces Faces Pain Scale: Hurts little more Pain Location: right leg Pain Descriptors / Indicators: Aching;Burning Pain Intervention(s): Limited activity within patient's tolerance;Monitored during session;Repositioned;Ice applied           PT Goals (current goals can now be found in the care plan section) Acute Rehab PT Goals Patient Stated Goal: to go home today Progress towards PT goals: Progressing toward goals    Frequency    7X/week      PT Plan Current plan remains appropriate  AM-PAC PT "6 Clicks" Daily Activity  Outcome Measure  Difficulty turning over in bed (including adjusting bedclothes, sheets and blankets)?: Unable Difficulty moving from lying on back to sitting on the side of the bed? : Unable Difficulty sitting down on and standing up from a chair with arms (e.g., wheelchair, bedside commode, etc,.)?: Unable Help needed moving to and from a bed to chair (including a wheelchair)?: A Little Help needed walking in hospital  room?: A Little Help needed climbing 3-5 steps with a railing? : A Little 6 Click Score: 12    End of Session   Activity Tolerance: Patient tolerated treatment well Patient left: in chair;with call bell/phone within reach;with family/visitor present Nurse Communication: Mobility status PT Visit Diagnosis: Muscle weakness (generalized) (M62.81);Difficulty in walking, not elsewhere classified (R26.2);Pain Pain - Right/Left: Right Pain - part of body: Hip     Time: 0092-3300 PT Time Calculation (min) (ACUTE ONLY): 25 min  Charges:  $Gait Training: 8-22 mins $Therapeutic Exercise: 8-22 mins          Harvey Lingo B. [place], [place], DPT [phone]            10/04/2017, 1:29 PM

## 2017-10-04 NOTE — Progress Notes (Signed)
PATIENT ID: Angela Estrada  MRN: [id number]  DOB/AGE:  1951/05/04 / 67 y.o.  2 Days Post-Op Procedure(s) (LRB): TOTAL HIP REVISION POSTERIOR (Right)    PROGRESS NOTE Subjective: Patient is alert, oriented, no Nausea, no Vomiting, yes passing gas, . Taking PO well. Denies SOB, Chest or Calf Pain. Using Incentive Spirometer, PAS in place. Ambulate WBAT with pt walking 85 ft with therapy Patient reports pain as  moderate .    Objective: Vital signs in last 24 hours: Vitals:   10/03/17 1506 10/03/17 1951 10/04/17 0019 10/04/17 0659  BP: 127/69 130/67 120/67 122/65  Pulse: 84 75 69 70  Resp: 17 16 15 15   Temp: 97.6 F (36.4 C) 98.6 F (37 C) 97.8 F (36.6 C) 98 F (36.7 C)  TempSrc: Oral Oral Oral Oral  SpO2: 97% 100% 99% 99%  Weight:      Height:          Intake/Output from previous day: I/O last 3 completed shifts: In: 690 [P.O.:690] Out: 1800 [Urine:1800]   Intake/Output this shift: Total I/O In: 240 [P.O.:240] Out: 2000 [Urine:2000]   LABORATORY DATA: Recent Labs    10/02/17 0953 10/03/17 0602 10/04/17 0620  WBC  --  12.7* 14.2*  HGB 14.3 12.7 11.7*  HCT 42.0 37.5 35.0*  PLT  --  247 256  NA 140 136  --   K 3.7 4.4  --   CL  --  105  --   CO2  --  23  --   BUN  --  9  --   CREATININE  --  0.69  --   GLUCOSE 114* 143*  --   CALCIUM  --  9.1  --     Examination: Neurologically intact Neurovascular intact Sensation intact distally Intact pulses distally Dorsiflexion/Plantar flexion intact Incision: dressing C/D/I and scant drainage No cellulitis present Compartment soft} XR AP&Lat of hip shows well placed\fixed THA  Assessment:   2 Days Post-Op Procedure(s) (LRB): TOTAL HIP REVISION POSTERIOR (Right) ADDITIONAL DIAGNOSIS:  Expected Acute Blood Loss Anemia, Hypertension  Plan: PT/OT WBAT, THA posterior hip precautions  DVT Prophylaxis: SCDx72 hrs, ASA 325 mg BID x 2 weeks  DISCHARGE PLAN: Home, when pt passes therapy  DISCHARGE NEEDS:  HHPT, Walker and 3-in-1 comode seat

## 2017-10-04 NOTE — Care Management Note (Signed)
Case Management Note  Patient Details  Name: Angela Estrada MRN: [id number] Date of Birth: 1950-09-20  Subjective/Objective:   66 yr old female s/p right hip revision.                  Action/Plan: Case manager spoke with patient concerning discharge plan and DME. Patient was preoperatively setup with [facility], no changes. She will have family support at discharge.    Expected Discharge Date:  10/04/17               Expected Discharge Plan:  [place]  In-House Referral:  NA  Discharge planning Services  CM Consult  Post Acute Care Choice:  Durable Medical Equipment, Home Health Choice offered to:  Patient  DME Arranged:  Walker rolling(Has 3in1) DME Agency:  [facility] Arranged:  PT [place] Agency:  [facility]  Status of Service:  Completed, signed off  If discussed at [place] of Stay Meetings, dates discussed:    Additional Comments:  Ninfa Meeker, RN 10/04/2017, 12:35 PM

## 2017-10-07 DIAGNOSIS — Z79891 Long term (current) use of opiate analgesic: Secondary | ICD-10-CM | POA: Diagnosis not present

## 2017-10-07 DIAGNOSIS — Z96641 Presence of right artificial hip joint: Secondary | ICD-10-CM | POA: Diagnosis not present

## 2017-10-07 DIAGNOSIS — I10 Essential (primary) hypertension: Secondary | ICD-10-CM | POA: Diagnosis not present

## 2017-10-07 DIAGNOSIS — M1991 Primary osteoarthritis, unspecified site: Secondary | ICD-10-CM | POA: Diagnosis not present

## 2017-10-07 DIAGNOSIS — Z471 Aftercare following joint replacement surgery: Secondary | ICD-10-CM | POA: Diagnosis not present

## 2017-10-07 DIAGNOSIS — Z7982 Long term (current) use of aspirin: Secondary | ICD-10-CM | POA: Diagnosis not present

## 2017-10-07 LAB — AEROBIC/ANAEROBIC CULTURE W GRAM STAIN (SURGICAL/DEEP WOUND): Culture: NO GROWTH

## 2017-10-08 DIAGNOSIS — Z96641 Presence of right artificial hip joint: Secondary | ICD-10-CM | POA: Diagnosis not present

## 2017-10-08 DIAGNOSIS — M1991 Primary osteoarthritis, unspecified site: Secondary | ICD-10-CM | POA: Diagnosis not present

## 2017-10-08 DIAGNOSIS — I10 Essential (primary) hypertension: Secondary | ICD-10-CM | POA: Diagnosis not present

## 2017-10-08 DIAGNOSIS — M25551 Pain in right hip: Secondary | ICD-10-CM | POA: Diagnosis not present

## 2017-10-08 DIAGNOSIS — Z471 Aftercare following joint replacement surgery: Secondary | ICD-10-CM | POA: Diagnosis not present

## 2017-10-08 DIAGNOSIS — Z7982 Long term (current) use of aspirin: Secondary | ICD-10-CM | POA: Diagnosis not present

## 2017-10-08 DIAGNOSIS — Z79891 Long term (current) use of opiate analgesic: Secondary | ICD-10-CM | POA: Diagnosis not present

## 2017-10-10 DIAGNOSIS — Z471 Aftercare following joint replacement surgery: Secondary | ICD-10-CM | POA: Diagnosis not present

## 2017-10-10 DIAGNOSIS — Z7982 Long term (current) use of aspirin: Secondary | ICD-10-CM | POA: Diagnosis not present

## 2017-10-10 DIAGNOSIS — Z79891 Long term (current) use of opiate analgesic: Secondary | ICD-10-CM | POA: Diagnosis not present

## 2017-10-10 DIAGNOSIS — M1991 Primary osteoarthritis, unspecified site: Secondary | ICD-10-CM | POA: Diagnosis not present

## 2017-10-10 DIAGNOSIS — I10 Essential (primary) hypertension: Secondary | ICD-10-CM | POA: Diagnosis not present

## 2017-10-10 DIAGNOSIS — Z96641 Presence of right artificial hip joint: Secondary | ICD-10-CM | POA: Diagnosis not present

## 2017-10-14 DIAGNOSIS — Z96641 Presence of right artificial hip joint: Secondary | ICD-10-CM | POA: Diagnosis not present

## 2017-10-14 DIAGNOSIS — M1991 Primary osteoarthritis, unspecified site: Secondary | ICD-10-CM | POA: Diagnosis not present

## 2017-10-14 DIAGNOSIS — Z471 Aftercare following joint replacement surgery: Secondary | ICD-10-CM | POA: Diagnosis not present

## 2017-10-14 DIAGNOSIS — Z7982 Long term (current) use of aspirin: Secondary | ICD-10-CM | POA: Diagnosis not present

## 2017-10-14 DIAGNOSIS — I10 Essential (primary) hypertension: Secondary | ICD-10-CM | POA: Diagnosis not present

## 2017-10-14 DIAGNOSIS — Z79891 Long term (current) use of opiate analgesic: Secondary | ICD-10-CM | POA: Diagnosis not present

## 2017-10-15 ENCOUNTER — Other Ambulatory Visit: Payer: Medicare HMO | Admitting: Orthotics

## 2017-10-15 DIAGNOSIS — M7750 Other enthesopathy of unspecified foot: Secondary | ICD-10-CM | POA: Diagnosis not present

## 2017-10-15 DIAGNOSIS — M76822 Posterior tibial tendinitis, left leg: Secondary | ICD-10-CM | POA: Diagnosis not present

## 2017-10-15 DIAGNOSIS — M25572 Pain in left ankle and joints of left foot: Secondary | ICD-10-CM | POA: Diagnosis not present

## 2017-10-22 DIAGNOSIS — M25551 Pain in right hip: Secondary | ICD-10-CM | POA: Diagnosis not present

## 2017-10-22 DIAGNOSIS — R262 Difficulty in walking, not elsewhere classified: Secondary | ICD-10-CM | POA: Diagnosis not present

## 2017-10-29 DIAGNOSIS — M25551 Pain in right hip: Secondary | ICD-10-CM | POA: Diagnosis not present

## 2017-10-29 DIAGNOSIS — R262 Difficulty in walking, not elsewhere classified: Secondary | ICD-10-CM | POA: Diagnosis not present

## 2017-10-31 ENCOUNTER — Ambulatory Visit: Payer: Medicare HMO | Admitting: Endocrinology

## 2017-11-01 ENCOUNTER — Encounter: Payer: Self-pay | Admitting: Endocrinology

## 2017-11-01 ENCOUNTER — Other Ambulatory Visit: Payer: Self-pay

## 2017-11-01 ENCOUNTER — Ambulatory Visit (INDEPENDENT_AMBULATORY_CARE_PROVIDER_SITE_OTHER): Payer: Medicare HMO | Admitting: Endocrinology

## 2017-11-01 VITALS — BP 142/72 | HR 96 | Wt 218.0 lb

## 2017-11-01 DIAGNOSIS — Z23 Encounter for immunization: Secondary | ICD-10-CM

## 2017-11-01 DIAGNOSIS — E876 Hypokalemia: Secondary | ICD-10-CM

## 2017-11-01 DIAGNOSIS — M19011 Primary osteoarthritis, right shoulder: Secondary | ICD-10-CM | POA: Diagnosis not present

## 2017-11-01 DIAGNOSIS — R262 Difficulty in walking, not elsewhere classified: Secondary | ICD-10-CM | POA: Diagnosis not present

## 2017-11-01 DIAGNOSIS — M25551 Pain in right hip: Secondary | ICD-10-CM | POA: Diagnosis not present

## 2017-11-01 LAB — BASIC METABOLIC PANEL
BUN: 13 mg/dL (ref 6–23)
CO2: 30 mEq/L (ref 19–32)
Calcium: 9.7 mg/dL (ref 8.4–10.5)
Chloride: 101 mEq/L (ref 96–112)
Creatinine, Ser: 0.78 mg/dL (ref 0.40–1.20)
GFR: 95.24 mL/min (ref >60.00)
Glucose, Bld: 105 mg/dL — ABNORMAL HIGH (ref 70–99)
Potassium: 3.6 mEq/L (ref 3.5–5.1)
Sodium: 138 mEq/L (ref 135–145)

## 2017-11-01 NOTE — Progress Notes (Signed)
we discussed code status.  pt requests full code, but would not want to be started or maintained on artificial life-support measures if there was not a reasonable chance of recovery 

## 2017-11-01 NOTE — Progress Notes (Signed)
Subjective:    Patient ID: Angela Estrada, female    DOB: Jun 23, 1950, 67 y.o.   MRN: [id number]  HPI  Hypokalemia: she takes klor as rx'ed.  Denies muscle weakness.   HTN: she denies sob.   Past Medical History:  Diagnosis Date  . Allergy   . Arthritis   . Arthritis   . HYPERCHOLESTEROLEMIA 04/25/2007   takes Pravastatin daily  . HYPERTENSION 01/03/2007   takes Losartan-HCTZ  and Amlodipine daily  . Hypokalemia    takes Potassium daily  . Joint pain   . Nocturia   . Pneumonia 2 yrs ago    Past Surgical History:  Procedure Laterality Date  . CESAREAN SECTION  25 years ago  . CHOLECYSTECTOMY    . COLONOSCOPY    . DILATION AND CURETTAGE OF UTERUS  35 years ago  . HERNIA REPAIR    . INCISIONAL HERNIA REPAIR  02/21/2012   Procedure: LAPAROSCOPIC INCISIONAL HERNIA;  Surgeon: Harl Bowie, MD;  Location: [facility];  Service: General;  Laterality: N/A;  laparoscopic incisional hernia repair with mesh  . JOINT REPLACEMENT     bilateral knee replacements  . REPLACEMENT TOTAL KNEE BILATERAL  2010  . TOTAL HIP ARTHROPLASTY Right 03/19/2016   Procedure: RIGHT TOTAL HIP ARTHROPLASTY ANTERIOR APPROACH;  Surgeon: Frederik Pear, MD;  Location: [facility];  Service: Orthopedics;  Laterality: Right;  . TOTAL HIP REVISION Right 10/02/2017   Procedure: TOTAL HIP REVISION POSTERIOR;  Surgeon: Frederik Pear, MD;  Location: [facility];  Service: Orthopedics;  Laterality: Right;  . TUBAL LIGATION      Social History   Socioeconomic History  . Marital status: Married    Spouse name: Not on file  . Number of children: 1  . Years of education: 12th grade +  . Highest education level: Not on file  Occupational History  . Occupation: Insurance claims handler  Social Needs  . Financial resource strain: Not on file  . Food insecurity:    Worry: Not on file    Inability: Not on file  . Transportation needs:    Medical: Not on file    Non-medical: Not on file  Tobacco Use  . Smoking status: Never Smoker  .  Smokeless tobacco: Never Used  Substance and Sexual Activity  . Alcohol use: No    Alcohol/week: 0.0 oz  . Drug use: No  . Sexual activity: Yes    Partners: Male    Birth control/protection: Post-menopausal  Lifestyle  . Physical activity:    Days per week: Not on file    Minutes per session: Not on file  . Stress: Not on file  Relationships  . Social connections:    Talks on phone: Not on file    Gets together: Not on file    Attends religious service: Not on file    Active member of club or organization: Not on file    Attends meetings of clubs or organizations: Not on file    Relationship status: Not on file  . Intimate partner violence:    Fear of current or ex partner: Not on file    Emotionally abused: Not on file    Physically abused: Not on file    Forced sexual activity: Not on file  Other Topics Concern  . Not on file  Social History Narrative   Lives with her husband.   Adult daughter lives nearby.    Current Outpatient Medications on File Prior to Visit  Medication Sig Dispense Refill  .  amLODipine (NORVASC) 2.5 MG tablet Take 1 tablet (2.5 mg total) by mouth daily. 90 tablet 0  . aspirin EC 325 MG tablet Take 1 tablet (325 mg total) by mouth 2 (two) times daily. 30 tablet 0  . Carboxymethylcellulose Sodium (LUBRICANT EYE DROPS OP) Place 1 drop into both eyes daily as needed (allergies).    . diclofenac sodium (VOLTAREN) 1 % GEL APPLY 2 GRAM(S) 2 TO 3 TIMES A DAY AS NEEDED FOR PAIN  1  . mometasone (NASONEX) 50 MCG/ACT nasal spray Place 2 sprays into the nose daily as needed. As needed for nasal allergies (Patient taking differently: Place 2 sprays into the nose daily as needed (allergies). ) 17 g 11  . Multiple Vitamin (MULTIVITAMIN WITH MINERALS) TABS Take 1 tablet by mouth daily.    . potassium chloride SA (K-DUR,KLOR-CON) 20 MEQ tablet Take 1 tablet (20 mEq total) by mouth 2 (two) times daily. 90 tablet 3  . pravastatin (PRAVACHOL) 20 MG tablet Take 1 tablet  (20 mg total) by mouth daily. (Patient taking differently: Take 20 mg by mouth every evening. ) 90 tablet 1  . telmisartan-hydrochlorothiazide (MICARDIS HCT) 80-25 MG tablet Take 1 tablet by mouth daily. 30 tablet 3  . tiZANidine (ZANAFLEX) 2 MG tablet Take 1 tablet (2 mg total) by mouth every 6 (six) hours as needed. 60 tablet 0   No current facility-administered medications on file prior to visit.     Allergies  Allergen Reactions  . Lisinopril Cough  . Oxycodone Other (See Comments)    Hallucinations    Family History  Problem Relation Age of Onset  . Diabetes Mother   . Heart disease Mother   . Hypertension Mother   . Stroke Mother   . Diabetes Father   . Heart disease Father        Triple Bypass  . Hypertension Father   . Heart attack Brother   . Congestive Heart Failure Brother   . Hypertension Brother   . Heart disease Brother   . Hypertension Sister   . Hypertension Sister   . Ovarian cancer Sister 4  . Hypertension Sister   . Hypertension Sister   . Hypertension Sister   . Hypertension Sister   . Cancer Neg Hx   . Colon cancer Neg Hx     BP (!) 142/72   Pulse 96   Wt 218 lb (98.9 kg)   LMP 08/30/1998 (Approximate)   SpO2 98%   BMI 38.62 kg/m    Review of Systems Denies chest pain.      Objective:   Physical Exam VITAL SIGNS:  See vs page GENERAL: no distress Ext: trace bilat leg edema Gait: steady with a walker.   Lab Results  Component Value Date   CREATININE 0.69 10/03/2017   BUN 9 10/03/2017   NA 136 10/03/2017   K 4.4 10/03/2017   CL 105 10/03/2017   CO2 23 10/03/2017      Assessment & Plan:  Hypokalemia: well-controlled. HTN: well-controlled Please continue the same medications   Subjective:   Patient here for Medicare annual wellness visit and management of other chronic and acute problems.     Risk factors: advanced age    76 of Physicians Providing Medical Care to Patient:  See "snapshot"   Activities of Daily  Living: In your present state of health, do you have any difficulty performing the following activities (lives with husband)?:  Preparing food and eating?: No  Bathing yourself: No  Getting dressed:  No  Using the toilet:No  Moving around from place to place: No  In the past year have you fallen or had a near fall?: No    Home Safety: Has smoke detector and wears seat belts. No firearms. No excess sun exposure.  Opioid Use: none  Diet and Exercise  Current exercise habits: limited by recent surgery Dietary issues discussed: pt reports a healthy diet   Depression Screen  Q1: Over the past two weeks, have you felt down, depressed or hopeless? no  Q2: Over the past two weeks, have you felt little interest or pleasure in doing things? no   The following portions of the patient's history were reviewed and updated as appropriate: allergies, current medications, past family history, past medical history, past social history, past surgical history and problem list.   Review of Systems  Denies hearing loss, and visual loss Objective:   Vision:  Advertising account executive, so she declines VA today Hearing: grossly normal Body mass index:  See vs page Msk: pt slowly performs "get-up-and-go" from a sitting position Cognitive Impairment Assessment: cognition, memory and judgment appear normal.  remembers 3/3 at 5 minutes.  excellent recall.  can easily read and write a sentence.  alert and oriented x 3.    Assessment:   Medicare wellness utd on preventive parameters    Plan:   During the course of the visit the patient was educated and counseled about appropriate screening and preventive services including:        Fall prevention is advised today  Screening mammography is done by GYN Bone densitometry screening is done by GYN Diabetes screening  Nutrition counseling is offered  advanced directives/end of life addressed today:  see healthcare directives hyperlink  Vaccines are updated as  needed  Patient Instructions (the written plan) was given to the patient.

## 2017-11-01 NOTE — Patient Instructions (Addendum)
Please continue the same medications. blood tests are requested for you today.  We'll let you know about the results. good diet and exercise significantly improve your health.  please let me know if you wish to be referred to a dietician.  high blood sugar is very risky to your health.  you should see an eye doctor and dentist every year.  It is very important to get all recommended vaccinations.  Please consider these measures for your health:  minimize alcohol.  Do not use tobacco products.  Have a colonoscopy at least every 10 years from age 36.  Women should have an annual mammogram from age 85.  Keep firearms safely stored.  Always use seat belts.  have working smoke alarms in your home.  See an eye doctor and dentist regularly.  Never drive under the influence of alcohol or drugs (including prescription drugs).   It is critically important to prevent falling down (keep floor areas well-lit, dry, and free of loose objects.  If you have a cane, walker, or wheelchair, you should use it, even for short trips around the house.  Wear flat-soled shoes.  Also, try not to rush). Please come back for a follow-up appointment in 6 months

## 2017-11-05 ENCOUNTER — Other Ambulatory Visit: Payer: Self-pay

## 2017-11-11 ENCOUNTER — Ambulatory Visit: Payer: Medicare HMO | Admitting: Endocrinology

## 2017-11-15 ENCOUNTER — Other Ambulatory Visit: Payer: Self-pay | Admitting: Endocrinology

## 2017-11-18 ENCOUNTER — Other Ambulatory Visit: Payer: Self-pay | Admitting: Orthopedic Surgery

## 2017-11-26 ENCOUNTER — Other Ambulatory Visit: Payer: Self-pay | Admitting: Orthopedic Surgery

## 2017-11-26 DIAGNOSIS — M19011 Primary osteoarthritis, right shoulder: Secondary | ICD-10-CM

## 2017-11-27 ENCOUNTER — Ambulatory Visit: Payer: Medicare HMO | Admitting: Podiatry

## 2017-11-27 ENCOUNTER — Encounter: Payer: Self-pay | Admitting: Podiatry

## 2017-11-27 DIAGNOSIS — M76822 Posterior tibial tendinitis, left leg: Secondary | ICD-10-CM

## 2017-11-27 DIAGNOSIS — M779 Enthesopathy, unspecified: Secondary | ICD-10-CM

## 2017-11-27 DIAGNOSIS — M25572 Pain in left ankle and joints of left foot: Secondary | ICD-10-CM | POA: Diagnosis not present

## 2017-11-27 NOTE — Progress Notes (Signed)
Subjective:   Patient ID: Angela Estrada, female   DOB: 67 y.o.   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

## 2017-11-28 DIAGNOSIS — Z96641 Presence of right artificial hip joint: Secondary | ICD-10-CM | POA: Diagnosis not present

## 2017-12-02 ENCOUNTER — Other Ambulatory Visit: Payer: Self-pay | Admitting: Endocrinology

## 2017-12-02 DIAGNOSIS — M19011 Primary osteoarthritis, right shoulder: Secondary | ICD-10-CM | POA: Diagnosis not present

## 2017-12-02 DIAGNOSIS — M25611 Stiffness of right shoulder, not elsewhere classified: Secondary | ICD-10-CM | POA: Diagnosis not present

## 2017-12-02 DIAGNOSIS — M25511 Pain in right shoulder: Secondary | ICD-10-CM | POA: Diagnosis not present

## 2017-12-05 ENCOUNTER — Other Ambulatory Visit: Payer: Medicare HMO

## 2017-12-10 NOTE — Pre-Procedure Instructions (Signed)
Angela Estrada  12/10/2017      [id number] - [place], [address] [place] [address] Phone: [phone] Fax: [phone]  [id number] - [place], [address] [address] Phone: [phone] Fax: [phone]    Your procedure is scheduled on Thurs., December 19, 2017 from 7:30AM-9:30AM  Report to [facility] Admitting Entrance "A" at 5:30AM  Call this number if you have problems the morning of surgery:  [phone]   Remember:  Do not eat or drink after midnight on July 10th    Take these medicines the morning of surgery with A SIP OF WATER: AmLODipine (NORVASC) If needed Mometasone (NASONEX)  Follow your doctors instructions regarding your Aspirin.  If no instructions were given by your doctor, then you will need to call the prescribing office office to get instructions.    7 days before surgery (7/4), stop taking all Other Aspirin Products, Vitamins, Fish oils, and Herbal medications. Also stop all NSAIDS i.e. Advil, Ibuprofen, Motrin, Aleve, Anaprox, Naproxen, BC, Goody Powders, and all Supplements.    Do not wear jewelry, make-up or nail polish.  Do not wear lotions, powders, or perfumes, or deodorant.  Do not shave 48 hours prior to surgery.    Do not bring valuables to the hospital.  [facility] is not responsible for any belongings or valuables.  Contacts, dentures or bridgework may not be worn into surgery.  Leave your suitcase in the car.  After surgery it may be brought to your room.  For patients admitted to the hospital, discharge time will be determined by your treatment team.  Patients discharged the day of surgery will not be allowed to drive home.   Special instructions:   - Preparing For Surgery  Before surgery, you can play an important role. Because skin is not sterile, your skin needs to be as free of  germs as possible. You can reduce the number of germs on your skin by washing with CHG (chlorahexidine gluconate) Soap before surgery.  CHG is an antiseptic cleaner which kills germs and bonds with the skin to continue killing germs even after washing.    Oral Hygiene is also important to reduce your risk of infection.  Remember - BRUSH YOUR TEETH THE MORNING OF SURGERY WITH YOUR REGULAR TOOTHPASTE  Please do not use if you have an allergy to CHG or antibacterial soaps. If your skin becomes reddened/irritated stop using the CHG.  Do not shave (including legs and underarms) for at least 48 hours prior to first CHG shower. It is OK to shave your face.  Please follow these instructions carefully.   1. Shower the NIGHT BEFORE SURGERY and the MORNING OF SURGERY with CHG.   2. If you chose to wash your hair, wash your hair first as usual with your normal shampoo.  3. After you shampoo, rinse your hair and body thoroughly to remove the shampoo.  4. Use CHG as you would any other liquid soap. You can apply CHG directly to the skin and wash gently with a scrungie or a clean washcloth.   5. Apply the CHG Soap to your body ONLY FROM THE NECK DOWN.  Do not use on open wounds or open sores. Avoid contact with your eyes, ears, mouth and genitals (private parts). Wash Face and genitals (private parts)  with your normal soap.  6. Wash thoroughly,  paying special attention to the area where your surgery will be performed.  7. Thoroughly rinse your body with warm water from the neck down.  8. DO NOT shower/wash with your normal soap after using and rinsing off the CHG Soap.  9. Pat yourself dry with a CLEAN TOWEL.  10. Wear CLEAN PAJAMAS to bed the night before surgery, wear comfortable clothes the morning of surgery  11. Place CLEAN SHEETS on your bed the night of your first shower and DO NOT SLEEP WITH PETS.  Day of Surgery:  Do not apply any deodorants/lotions.  Please wear clean clothes to the  hospital/surgery center.   Remember to brush your teeth WITH YOUR REGULAR TOOTHPASTE.  Please read over the following fact sheets that you were given. Pain Booklet, Coughing and Deep Breathing, MRSA Information and Surgical Site Infection Prevention

## 2017-12-11 ENCOUNTER — Encounter (HOSPITAL_COMMUNITY): Payer: Self-pay

## 2017-12-11 ENCOUNTER — Encounter (HOSPITAL_COMMUNITY)
Admission: RE | Admit: 2017-12-11 | Discharge: 2017-12-11 | Disposition: A | Discharge status: Home / Self Care | Payer: Medicare HMO | Source: Ambulatory Visit | Attending: Orthopedic Surgery | Admitting: Orthopedic Surgery

## 2017-12-11 ENCOUNTER — Other Ambulatory Visit: Payer: Self-pay

## 2017-12-11 DIAGNOSIS — Z01812 Encounter for preprocedural laboratory examination: Secondary | ICD-10-CM | POA: Diagnosis not present

## 2017-12-11 HISTORY — DX: Primary osteoarthritis, unspecified shoulder: M19.019

## 2017-12-11 HISTORY — DX: Unspecified abdominal hernia without obstruction or gangrene: K46.9

## 2017-12-11 LAB — COMPREHENSIVE METABOLIC PANEL
ALT: 19 U/L (ref 0–44)
AST: 23 U/L (ref 15–41)
Albumin: 3.8 g/dL (ref 3.5–5.0)
Alkaline Phosphatase: 67 U/L (ref 38–126)
Anion gap: 10 (ref 5–15)
BUN: 11 mg/dL (ref 8–23)
CO2: 27 mmol/L (ref 22–32)
Calcium: 9.8 mg/dL (ref 8.9–10.3)
Chloride: 105 mmol/L (ref 98–111)
Creatinine, Ser: 0.65 mg/dL (ref 0.44–1.00)
GFR calc Af Amer: >60 mL/min (ref >60)
GFR calc non Af Amer: >60 mL/min (ref >60)
Glucose, Bld: 121 mg/dL — ABNORMAL HIGH (ref 70–99)
Potassium: 3.7 mmol/L (ref 3.5–5.1)
Sodium: 142 mmol/L (ref 135–145)
Total Bilirubin: 0.9 mg/dL (ref 0.3–1.2)
Total Protein: 7.6 g/dL (ref 6.5–8.1)

## 2017-12-11 LAB — CBC WITH DIFFERENTIAL/PLATELET
Abs Immature Granulocytes: 0 10*3/uL (ref 0.0–0.1)
Basophils Absolute: 0.1 10*3/uL (ref 0.0–0.1)
Basophils Relative: 1 %
Eosinophils Absolute: 0.2 10*3/uL (ref 0.0–0.7)
Eosinophils Relative: 3 %
HCT: 44.6 % (ref 36.0–46.0)
Hemoglobin: 14.2 g/dL (ref 12.0–15.0)
Immature Granulocytes: 0 %
Lymphocytes Relative: 27 %
Lymphs Abs: 1.9 10*3/uL (ref 0.7–4.0)
MCH: 29.3 pg (ref 26.0–34.0)
MCHC: 31.8 g/dL (ref 30.0–36.0)
MCV: 92 fL (ref 78.0–100.0)
Monocytes Absolute: 0.5 10*3/uL (ref 0.1–1.0)
Monocytes Relative: 7 %
Neutro Abs: 4.3 10*3/uL (ref 1.7–7.7)
Neutrophils Relative %: 62 %
Platelets: 266 10*3/uL (ref 150–400)
RBC: 4.85 MIL/uL (ref 3.87–5.11)
RDW: 13.2 % (ref 11.5–15.5)
WBC: 7 10*3/uL (ref 4.0–10.5)

## 2017-12-11 LAB — URINALYSIS, ROUTINE W REFLEX MICROSCOPIC
Bilirubin Urine: NEGATIVE
Glucose, UA: NEGATIVE mg/dL
Hgb urine dipstick: NEGATIVE
Ketones, ur: NEGATIVE mg/dL
Leukocytes, UA: NEGATIVE
Nitrite: NEGATIVE
Protein, ur: NEGATIVE mg/dL
Specific Gravity, Urine: 1.013 (ref 1.005–1.030)
pH: 6 (ref 5.0–8.0)

## 2017-12-11 LAB — PROTIME-INR
INR: 1.01
Prothrombin Time: 13.2 seconds (ref 11.4–15.2)

## 2017-12-11 LAB — SURGICAL PCR SCREEN
MRSA, PCR: NEGATIVE
Staphylococcus aureus: NEGATIVE

## 2017-12-11 LAB — APTT: aPTT: 34 seconds (ref 24–36)

## 2017-12-11 LAB — TYPE AND SCREEN
ABO/RH(D): A POS
Antibody Screen: NEGATIVE

## 2017-12-11 NOTE — Progress Notes (Signed)
PCP - Dr. Renato Shin  Cardiologist - Denies  Chest x-ray - 09/20/17 (E)  EKG - 09/20/17 (E)  Stress Test - Denies  ECHO - Denies  Cardiac Cath - Denies  Sleep Study - Denies CPAP - None  LABS- 12/11/17: CBC w/D, CMP, PT, PTT, T/S, UA  ASA- LD- 7/4  Anesthesia- No  Pt denies having chest pain, sob, or fever at this time. All instructions explained to the pt, with a verbal understanding of the material. Pt agrees to go over the instructions while at home for a better understanding. The opportunity to ask questions was provided.

## 2017-12-16 ENCOUNTER — Ambulatory Visit
Admission: RE | Admit: 2017-12-16 | Discharge: 2017-12-16 | Disposition: A | Discharge status: Home / Self Care | Payer: Medicare HMO | Source: Ambulatory Visit | Attending: Orthopedic Surgery | Admitting: Orthopedic Surgery

## 2017-12-16 DIAGNOSIS — M19011 Primary osteoarthritis, right shoulder: Secondary | ICD-10-CM

## 2017-12-18 MED ORDER — TRANEXAMIC ACID 1000 MG/10ML IV SOLN
1000.0000 mg | INTRAVENOUS | Status: AC
  Administered 2017-12-19: 1000 mg via INTRAVENOUS
  Filled 2017-12-18: qty 10
  Filled 2017-12-18: qty 1100

## 2017-12-19 ENCOUNTER — Other Ambulatory Visit: Payer: Self-pay

## 2017-12-19 ENCOUNTER — Inpatient Hospital Stay (HOSPITAL_COMMUNITY): Payer: Medicare HMO

## 2017-12-19 ENCOUNTER — Inpatient Hospital Stay (HOSPITAL_COMMUNITY): Payer: Medicare HMO | Admitting: Anesthesiology

## 2017-12-19 ENCOUNTER — Encounter (HOSPITAL_COMMUNITY): Payer: Self-pay | Admitting: Anesthesiology

## 2017-12-19 ENCOUNTER — Encounter (HOSPITAL_COMMUNITY)
Admission: RE | Disposition: A | Discharge status: Home / Self Care | Payer: Self-pay | Source: Home / Self Care | Attending: Orthopedic Surgery

## 2017-12-19 ENCOUNTER — Inpatient Hospital Stay (HOSPITAL_COMMUNITY)
Admission: RE | Admit: 2017-12-19 | Discharge: 2017-12-20 | DRG: 483 | Disposition: A | Discharge status: Home / Self Care | Payer: Medicare HMO | Attending: Orthopedic Surgery | Admitting: Orthopedic Surgery

## 2017-12-19 DIAGNOSIS — Z8041 Family history of malignant neoplasm of ovary: Secondary | ICD-10-CM

## 2017-12-19 DIAGNOSIS — Z96641 Presence of right artificial hip joint: Secondary | ICD-10-CM | POA: Diagnosis not present

## 2017-12-19 DIAGNOSIS — M25711 Osteophyte, right shoulder: Secondary | ICD-10-CM | POA: Diagnosis present

## 2017-12-19 DIAGNOSIS — Z885 Allergy status to narcotic agent status: Secondary | ICD-10-CM

## 2017-12-19 DIAGNOSIS — M19011 Primary osteoarthritis, right shoulder: Principal | ICD-10-CM | POA: Diagnosis present

## 2017-12-19 DIAGNOSIS — Z471 Aftercare following joint replacement surgery: Secondary | ICD-10-CM | POA: Diagnosis not present

## 2017-12-19 DIAGNOSIS — Z8249 Family history of ischemic heart disease and other diseases of the circulatory system: Secondary | ICD-10-CM | POA: Diagnosis not present

## 2017-12-19 DIAGNOSIS — Z9049 Acquired absence of other specified parts of digestive tract: Secondary | ICD-10-CM

## 2017-12-19 DIAGNOSIS — Z833 Family history of diabetes mellitus: Secondary | ICD-10-CM | POA: Diagnosis not present

## 2017-12-19 DIAGNOSIS — E876 Hypokalemia: Secondary | ICD-10-CM | POA: Diagnosis not present

## 2017-12-19 DIAGNOSIS — Z96611 Presence of right artificial shoulder joint: Secondary | ICD-10-CM

## 2017-12-19 DIAGNOSIS — I10 Essential (primary) hypertension: Secondary | ICD-10-CM | POA: Diagnosis present

## 2017-12-19 DIAGNOSIS — Z79899 Other long term (current) drug therapy: Secondary | ICD-10-CM

## 2017-12-19 DIAGNOSIS — Z823 Family history of stroke: Secondary | ICD-10-CM | POA: Diagnosis not present

## 2017-12-19 DIAGNOSIS — Z7982 Long term (current) use of aspirin: Secondary | ICD-10-CM | POA: Diagnosis not present

## 2017-12-19 DIAGNOSIS — Z96653 Presence of artificial knee joint, bilateral: Secondary | ICD-10-CM | POA: Diagnosis not present

## 2017-12-19 DIAGNOSIS — Z9851 Tubal ligation status: Secondary | ICD-10-CM | POA: Diagnosis not present

## 2017-12-19 DIAGNOSIS — G8918 Other acute postprocedural pain: Secondary | ICD-10-CM | POA: Diagnosis not present

## 2017-12-19 DIAGNOSIS — Z888 Allergy status to other drugs, medicaments and biological substances status: Secondary | ICD-10-CM

## 2017-12-19 DIAGNOSIS — E7800 Pure hypercholesterolemia, unspecified: Secondary | ICD-10-CM | POA: Diagnosis present

## 2017-12-19 HISTORY — PX: TOTAL SHOULDER ARTHROPLASTY: SHX126

## 2017-12-19 LAB — TYPE AND SCREEN
ABO/RH(D): A POS
Antibody Screen: NEGATIVE

## 2017-12-19 LAB — ABO/RH: ABO/RH(D): A POS

## 2017-12-19 SURGERY — ARTHROPLASTY, SHOULDER, TOTAL
Anesthesia: General | Site: Shoulder | Laterality: Right

## 2017-12-19 MED ORDER — MENTHOL 3 MG MT LOZG: 1.0000 | LOZENGE | OROMUCOSAL | Status: DC | PRN

## 2017-12-19 MED ORDER — FENTANYL CITRATE (PF) 100 MCG/2ML IJ SOLN: 25.0000 ug | INTRAMUSCULAR | Status: DC | PRN

## 2017-12-19 MED ORDER — PROPOFOL 10 MG/ML IV BOLUS
INTRAVENOUS | Status: AC
  Filled 2017-12-19: qty 40

## 2017-12-19 MED ORDER — ONDANSETRON HCL 4 MG/2ML IJ SOLN: 4.0000 mg | Freq: Four times a day (QID) | INTRAMUSCULAR | Status: DC | PRN

## 2017-12-19 MED ORDER — DEXAMETHASONE SODIUM PHOSPHATE 10 MG/ML IJ SOLN
INTRAMUSCULAR | Status: AC
  Filled 2017-12-19: qty 3

## 2017-12-19 MED ORDER — CEFAZOLIN SODIUM-DEXTROSE 2-4 GM/100ML-% IV SOLN
2.0000 g | Freq: Four times a day (QID) | INTRAVENOUS | Status: AC
  Administered 2017-12-19 – 2017-12-20 (×3): 2 g via INTRAVENOUS
  Filled 2017-12-19 (×3): qty 100

## 2017-12-19 MED ORDER — SODIUM CHLORIDE 0.9 % IV SOLN
INTRAVENOUS | Status: DC
  Administered 2017-12-19 – 2017-12-20 (×3): via INTRAVENOUS

## 2017-12-19 MED ORDER — DOCUSATE SODIUM 100 MG PO CAPS
100.0000 mg | ORAL_CAPSULE | Freq: Two times a day (BID) | ORAL | Status: DC
  Administered 2017-12-19: 100 mg via ORAL
  Filled 2017-12-19: qty 1

## 2017-12-19 MED ORDER — LIDOCAINE 2% (20 MG/ML) 5 ML SYRINGE
INTRAMUSCULAR | Status: AC
Start: 2017-12-19 — End: ?
  Filled 2017-12-19: qty 5

## 2017-12-19 MED ORDER — METOCLOPRAMIDE HCL 5 MG PO TABS: 5.0000 mg | ORAL_TABLET | Freq: Three times a day (TID) | ORAL | Status: DC | PRN

## 2017-12-19 MED ORDER — EPHEDRINE SULFATE-NACL 50-0.9 MG/10ML-% IV SOSY
PREFILLED_SYRINGE | INTRAVENOUS | Status: DC | PRN
  Administered 2017-12-19: 5 mg via INTRAVENOUS

## 2017-12-19 MED ORDER — EPHEDRINE 5 MG/ML INJ
INTRAVENOUS | Status: AC
Start: 2017-12-19 — End: ?
  Filled 2017-12-19: qty 10

## 2017-12-19 MED ORDER — PHENYLEPHRINE 40 MCG/ML (10ML) SYRINGE FOR IV PUSH (FOR BLOOD PRESSURE SUPPORT)
PREFILLED_SYRINGE | INTRAVENOUS | Status: DC | PRN
  Administered 2017-12-19 (×5): 80 ug via INTRAVENOUS

## 2017-12-19 MED ORDER — METHOCARBAMOL 1000 MG/10ML IJ SOLN
500.0000 mg | Freq: Four times a day (QID) | INTRAMUSCULAR | Status: DC | PRN
  Administered 2017-12-20: 500 mg via INTRAVENOUS
  Filled 2017-12-19: qty 550

## 2017-12-19 MED ORDER — ONDANSETRON HCL 4 MG/2ML IJ SOLN
INTRAMUSCULAR | Status: AC
  Filled 2017-12-19: qty 6

## 2017-12-19 MED ORDER — ACETAMINOPHEN 500 MG PO TABS
500.0000 mg | ORAL_TABLET | Freq: Four times a day (QID) | ORAL | Status: AC
  Administered 2017-12-19 – 2017-12-20 (×4): 500 mg via ORAL
  Filled 2017-12-19 (×4): qty 1

## 2017-12-19 MED ORDER — ONDANSETRON HCL 4 MG PO TABS: 4.0000 mg | ORAL_TABLET | Freq: Four times a day (QID) | ORAL | Status: DC | PRN

## 2017-12-19 MED ORDER — ONDANSETRON HCL 4 MG/2ML IJ SOLN: 4.0000 mg | Freq: Once | INTRAMUSCULAR | Status: DC | PRN

## 2017-12-19 MED ORDER — ONDANSETRON HCL 4 MG/2ML IJ SOLN
INTRAMUSCULAR | Status: DC | PRN
  Administered 2017-12-19: 4 mg via INTRAVENOUS

## 2017-12-19 MED ORDER — MIDAZOLAM HCL 2 MG/2ML IJ SOLN
INTRAMUSCULAR | Status: AC
  Filled 2017-12-19: qty 2

## 2017-12-19 MED ORDER — DIPHENHYDRAMINE HCL 12.5 MG/5ML PO ELIX: 12.5000 mg | ORAL_SOLUTION | ORAL | Status: DC | PRN

## 2017-12-19 MED ORDER — ALUM & MAG HYDROXIDE-SIMETH 200-200-20 MG/5ML PO SUSP: 15.0000 mL | ORAL | Status: DC | PRN

## 2017-12-19 MED ORDER — FLEET ENEMA 7-19 GM/118ML RE ENEM
1.0000 | ENEMA | Freq: Once | RECTAL | Status: DC | PRN
Start: 2017-12-19 — End: 2017-12-20

## 2017-12-19 MED ORDER — AMLODIPINE BESYLATE 5 MG PO TABS: 2.5000 mg | ORAL_TABLET | Freq: Every day | ORAL | Status: DC

## 2017-12-19 MED ORDER — SENNOSIDES-DOCUSATE SODIUM 8.6-50 MG PO TABS: 1.0000 | ORAL_TABLET | Freq: Every evening | ORAL | Status: DC | PRN

## 2017-12-19 MED ORDER — SODIUM CHLORIDE 0.9 % IR SOLN
Status: DC | PRN
  Administered 2017-12-19: 3000 mL

## 2017-12-19 MED ORDER — FENTANYL CITRATE (PF) 100 MCG/2ML IJ SOLN
INTRAMUSCULAR | Status: AC
  Filled 2017-12-19: qty 2

## 2017-12-19 MED ORDER — SUGAMMADEX SODIUM 200 MG/2ML IV SOLN
INTRAVENOUS | Status: AC
  Filled 2017-12-19: qty 2

## 2017-12-19 MED ORDER — HYDROCODONE-ACETAMINOPHEN 5-325 MG PO TABS: 1.0000 | ORAL_TABLET | ORAL | Status: DC | PRN

## 2017-12-19 MED ORDER — PROPOFOL 10 MG/ML IV BOLUS
INTRAVENOUS | Status: DC | PRN
  Administered 2017-12-19: 140 mg via INTRAVENOUS

## 2017-12-19 MED ORDER — DEXAMETHASONE SODIUM PHOSPHATE 10 MG/ML IJ SOLN
INTRAMUSCULAR | Status: DC | PRN
  Administered 2017-12-19: 10 mg via INTRAVENOUS

## 2017-12-19 MED ORDER — MORPHINE SULFATE (PF) 2 MG/ML IV SOLN: 0.5000 mg | INTRAVENOUS | Status: DC | PRN

## 2017-12-19 MED ORDER — PHENOL 1.4 % MT LIQD: 1.0000 | OROMUCOSAL | Status: DC | PRN

## 2017-12-19 MED ORDER — TELMISARTAN-HCTZ 80-25 MG PO TABS: 1.0000 | ORAL_TABLET | Freq: Every day | ORAL | Status: DC

## 2017-12-19 MED ORDER — 0.9 % SODIUM CHLORIDE (POUR BTL) OPTIME
TOPICAL | Status: DC | PRN
  Administered 2017-12-19: 1000 mL

## 2017-12-19 MED ORDER — ALUMINUM HYDROXIDE GEL 320 MG/5ML PO SUSP: 15.0000 mL | ORAL | Status: DC | PRN

## 2017-12-19 MED ORDER — TRAMADOL HCL 50 MG PO TABS
50.0000 mg | ORAL_TABLET | Freq: Four times a day (QID) | ORAL | Status: DC
  Administered 2017-12-19 – 2017-12-20 (×4): 50 mg via ORAL
  Filled 2017-12-19 (×4): qty 1

## 2017-12-19 MED ORDER — CEFAZOLIN SODIUM-DEXTROSE 2-4 GM/100ML-% IV SOLN
2.0000 g | INTRAVENOUS | Status: AC
  Administered 2017-12-19: 2 g via INTRAVENOUS
  Filled 2017-12-19: qty 100

## 2017-12-19 MED ORDER — MIDAZOLAM HCL 5 MG/5ML IJ SOLN
INTRAMUSCULAR | Status: DC | PRN
  Administered 2017-12-19: 2 mg via INTRAVENOUS

## 2017-12-19 MED ORDER — BISACODYL 5 MG PO TBEC: 5.0000 mg | DELAYED_RELEASE_TABLET | Freq: Every day | ORAL | Status: DC | PRN

## 2017-12-19 MED ORDER — IRBESARTAN 150 MG PO TABS
300.0000 mg | ORAL_TABLET | Freq: Every day | ORAL | Status: DC
  Filled 2017-12-19: qty 2

## 2017-12-19 MED ORDER — PHENYLEPHRINE 40 MCG/ML (10ML) SYRINGE FOR IV PUSH (FOR BLOOD PRESSURE SUPPORT)
PREFILLED_SYRINGE | INTRAVENOUS | Status: AC
  Filled 2017-12-19: qty 10

## 2017-12-19 MED ORDER — PHENYLEPHRINE HCL-NACL 10-0.9 MG/250ML-% IV SOLN
INTRAVENOUS | Status: AC
  Filled 2017-12-19: qty 500

## 2017-12-19 MED ORDER — SUGAMMADEX SODIUM 200 MG/2ML IV SOLN
INTRAVENOUS | Status: DC | PRN
  Administered 2017-12-19: 200 mg via INTRAVENOUS

## 2017-12-19 MED ORDER — PRAVASTATIN SODIUM 20 MG PO TABS
20.0000 mg | ORAL_TABLET | Freq: Every evening | ORAL | Status: DC
  Administered 2017-12-19: 20 mg via ORAL
  Filled 2017-12-19 (×2): qty 1

## 2017-12-19 MED ORDER — METOCLOPRAMIDE HCL 5 MG/ML IJ SOLN: 5.0000 mg | Freq: Three times a day (TID) | INTRAMUSCULAR | Status: DC | PRN

## 2017-12-19 MED ORDER — ACETAMINOPHEN 325 MG PO TABS: 325.0000 mg | ORAL_TABLET | Freq: Four times a day (QID) | ORAL | Status: DC | PRN

## 2017-12-19 MED ORDER — ROCURONIUM BROMIDE 100 MG/10ML IV SOLN
INTRAVENOUS | Status: AC
Start: 2017-12-19 — End: ?
  Filled 2017-12-19: qty 2

## 2017-12-19 MED ORDER — METHOCARBAMOL 500 MG PO TABS: 500.0000 mg | ORAL_TABLET | Freq: Four times a day (QID) | ORAL | Status: DC | PRN

## 2017-12-19 MED ORDER — LACTATED RINGERS IV SOLN
INTRAVENOUS | Status: DC
Start: 2017-12-19 — End: 2017-12-20
  Administered 2017-12-19 (×3): via INTRAVENOUS

## 2017-12-19 MED ORDER — POVIDONE-IODINE 7.5 % EX SOLN: Freq: Once | CUTANEOUS | Status: DC

## 2017-12-19 MED ORDER — ASPIRIN EC 81 MG PO TBEC
81.0000 mg | DELAYED_RELEASE_TABLET | Freq: Two times a day (BID) | ORAL | Status: DC
  Administered 2017-12-19: 81 mg via ORAL
  Filled 2017-12-19: qty 1

## 2017-12-19 MED ORDER — ROCURONIUM BROMIDE 10 MG/ML (PF) SYRINGE
PREFILLED_SYRINGE | INTRAVENOUS | Status: DC | PRN
  Administered 2017-12-19: 10 mg via INTRAVENOUS
  Administered 2017-12-19: 50 mg via INTRAVENOUS

## 2017-12-19 MED ORDER — BUPIVACAINE HCL (PF) 0.5 % IJ SOLN
INTRAMUSCULAR | Status: DC | PRN
  Administered 2017-12-19: 10 mL via PERINEURAL

## 2017-12-19 MED ORDER — POTASSIUM CHLORIDE CRYS ER 20 MEQ PO TBCR
20.0000 meq | EXTENDED_RELEASE_TABLET | Freq: Two times a day (BID) | ORAL | Status: DC
  Administered 2017-12-19: 20 meq via ORAL
  Filled 2017-12-19: qty 1

## 2017-12-19 MED ORDER — FENTANYL CITRATE (PF) 100 MCG/2ML IJ SOLN
INTRAMUSCULAR | Status: DC | PRN
  Administered 2017-12-19: 75 ug via INTRAVENOUS
  Administered 2017-12-19: 25 ug via INTRAVENOUS
  Administered 2017-12-19 (×2): 50 ug via INTRAVENOUS

## 2017-12-19 MED ORDER — HYDROCHLOROTHIAZIDE 25 MG PO TABS
25.0000 mg | ORAL_TABLET | Freq: Every day | ORAL | Status: DC
  Filled 2017-12-19: qty 1

## 2017-12-19 MED ORDER — HYDROCODONE-ACETAMINOPHEN 7.5-325 MG PO TABS: 1.0000 | ORAL_TABLET | ORAL | Status: DC | PRN

## 2017-12-19 MED ORDER — BUPIVACAINE LIPOSOME 1.3 % IJ SUSP
INTRAMUSCULAR | Status: DC | PRN
  Administered 2017-12-19: 10 mL via PERINEURAL

## 2017-12-19 MED ORDER — BUPIVACAINE-EPINEPHRINE (PF) 0.25% -1:200000 IJ SOLN
INTRAMUSCULAR | Status: AC
  Filled 2017-12-19: qty 30

## 2017-12-19 SURGICAL SUPPLY — 74 items
AEQUALIS PERFORM GUIDE WIRE ×1 IMPLANT
BAG SPEC THK2 15X12 ZIP CLS (MISCELLANEOUS) ×1
BAG ZIPLOCK 12X15 (MISCELLANEOUS) ×3 IMPLANT
BIT DRILL 1.6MX128 (BIT) ×2 IMPLANT
BIT DRILL 1.6MX128MM (BIT) ×1
BIT DRILL 2.4X128 (BIT) ×1 IMPLANT
BIT DRILL 2.4X128MM (BIT) ×1
BLADE SAW SAG 73X25 THK (BLADE) ×2
BLADE SAW SGTL 18X1.27X75 (BLADE) IMPLANT
BLADE SAW SGTL 18X1.27X75MM (BLADE)
BLADE SAW SGTL 73X25 THK (BLADE) ×1 IMPLANT
BLADE SURG 15 STRL LF DISP TIS (BLADE) ×1 IMPLANT
BLADE SURG 15 STRL SS (BLADE) ×3
CEMENT BONE DEPUY (Cement) ×3 IMPLANT
CHLORAPREP W/TINT 26ML (MISCELLANEOUS) ×3 IMPLANT
CLEANER TIP ELECTROSURG 2X2 (MISCELLANEOUS) ×3 IMPLANT
CLOSURE STERI-STRIP 1/2X4 (GAUZE/BANDAGES/DRESSINGS)
CLSR STERI-STRIP ANTIMIC 1/2X4 (GAUZE/BANDAGES/DRESSINGS) ×1 IMPLANT
COVER BACK TABLE 60X90IN (DRAPES) ×3 IMPLANT
COVER SURGICAL LIGHT HANDLE (MISCELLANEOUS) ×3 IMPLANT
DRAPE INCISE IOBAN 66X45 STRL (DRAPES) ×3 IMPLANT
DRAPE ORTHO SPLIT 77X108 STRL (DRAPES) ×3
DRAPE POUCH INSTRU U-SHP 10X18 (DRAPES) ×3 IMPLANT
DRAPE SURG 17X11 SM STRL (DRAPES) ×3 IMPLANT
DRAPE SURG ORHT 6 SPLT 77X108 (DRAPES) ×1 IMPLANT
DRAPE U-SHAPE 47X51 STRL (DRAPES) ×3 IMPLANT
DRSG ADAPTIC 3X8 NADH LF (GAUZE/BANDAGES/DRESSINGS) ×1 IMPLANT
DRSG AQUACEL AG ADV 3.5X 6 (GAUZE/BANDAGES/DRESSINGS) ×2 IMPLANT
DRSG MEPILEX BORDER 4X4 (GAUZE/BANDAGES/DRESSINGS) ×1 IMPLANT
DRSG MEPILEX BORDER 4X8 (GAUZE/BANDAGES/DRESSINGS) ×1 IMPLANT
ELECT BLADE TIP CTD 4 INCH (ELECTRODE) ×3 IMPLANT
ELECT REM PT RETURN 15FT ADLT (MISCELLANEOUS) ×3 IMPLANT
EVACUATOR 1/8 PVC DRAIN (DRAIN) ×3 IMPLANT
FACESHIELD WRAPAROUND (MASK) ×6 IMPLANT
FACESHIELD WRAPAROUND OR TEAM (MASK) ×2 IMPLANT
GLENOID PEGGED CORTILOC M40 (Orthopedic Implant) IMPLANT
GLOVE BIO SURGEON STRL SZ7 (GLOVE) ×5 IMPLANT
GLOVE BIO SURGEON STRL SZ7.5 (GLOVE) ×3 IMPLANT
GLOVE BIOGEL PI IND STRL 7.0 (GLOVE) ×1 IMPLANT
GLOVE BIOGEL PI IND STRL 8 (GLOVE) ×1 IMPLANT
GLOVE BIOGEL PI INDICATOR 7.0 (GLOVE) ×2
GLOVE BIOGEL PI INDICATOR 8 (GLOVE) ×2
GOWN STRL REUS W/TWL LRG LVL3 (GOWN DISPOSABLE) ×5 IMPLANT
GOWN STRL REUS W/TWL XL LVL3 (GOWN DISPOSABLE) ×5 IMPLANT
GUIDEWIRE GLENOID 2.5X220 (WIRE) ×2 IMPLANT
HANDPIECE INTERPULSE COAX TIP (DISPOSABLE) ×3
HEAD HUM AEQUALIS 43X16 (Head) ×2 IMPLANT
HEMOSTAT SURGICEL 4X8 (HEMOSTASIS) ×2 IMPLANT
HOOD PEEL AWAY FLYTE STAYCOOL (MISCELLANEOUS) ×6 IMPLANT
KIT BASIN OR (CUSTOM PROCEDURE TRAY) ×3 IMPLANT
MANIFOLD NEPTUNE II (INSTRUMENTS) ×3 IMPLANT
NDL MA TROC 1/2 (NEEDLE) ×1 IMPLANT
NEEDLE MA TROC 1/2 (NEEDLE) ×3 IMPLANT
NS IRRIG 1000ML POUR BTL (IV SOLUTION) ×3 IMPLANT
PACK SHOULDER (CUSTOM PROCEDURE TRAY) ×3 IMPLANT
PEGGED GLENOID CORTILOC (Orthopedic Implant) ×2 IMPLANT
POSITIONER SURGICAL ARM (MISCELLANEOUS) ×3 IMPLANT
RETRIEVER SUT HEWSON (MISCELLANEOUS) ×3 IMPLANT
SET HNDPC FAN SPRY TIP SCT (DISPOSABLE) ×1 IMPLANT
SLING ARM IMMOBILIZER LRG (SOFTGOODS) ×3 IMPLANT
SPONGE LAP 18X18 RF (DISPOSABLE) IMPLANT
STAPLER VISISTAT 35W (STAPLE) IMPLANT
STEM HUMERAL PTC SZ1C 66 1275D (Stem) ×2 IMPLANT
SUCTION FRAZIER HANDLE 12FR (TUBING) ×2
SUCTION TUBE FRAZIER 12FR DISP (TUBING) ×1 IMPLANT
SUT ETHIBOND NAB CT1 #1 30IN (SUTURE) ×6 IMPLANT
SUT FIBERWIRE #2 38 T-5 BLUE (SUTURE) ×9
SUT MNCRL AB 4-0 PS2 18 (SUTURE) ×3 IMPLANT
SUT VIC AB 0 CT1 36 (SUTURE) ×6 IMPLANT
SUT VIC AB 2-0 CT1 27 (SUTURE) ×6
SUT VIC AB 2-0 CT1 TAPERPNT 27 (SUTURE) ×2 IMPLANT
SUTURE FIBERWR #2 38 T-5 BLUE (SUTURE) ×3 IMPLANT
TOWEL OR 17X26 10 PK STRL BLUE (TOWEL DISPOSABLE) ×6 IMPLANT
WATER STERILE IRR 1000ML POUR (IV SOLUTION) ×3 IMPLANT

## 2017-12-19 NOTE — Anesthesia Postprocedure Evaluation (Signed)
Anesthesia Post Note  Patient: [name]  Procedure(s) Performed: RIGHT TOTAL SHOULDER ARTHROPLASTY (Right Shoulder)     Patient location during evaluation: PACU Anesthesia Type: General Level of consciousness: awake and alert Pain management: pain level controlled Vital Signs Assessment: post-procedure vital signs reviewed and stable Respiratory status: spontaneous breathing, nonlabored ventilation and respiratory function stable Cardiovascular status: blood pressure returned to baseline and stable Postop Assessment: no apparent nausea or vomiting Anesthetic complications: no    Last Vitals:  Vitals:   12/19/17 1015 12/19/17 1028  BP: 128/72 (!) 152/80  Pulse: 66 65  Resp: 13 16  Temp: 36.5 C 36.4 C  SpO2: 99% 96%    Last Pain:  Vitals:   12/19/17 1028  TempSrc: Oral  PainSc: 0-No pain                 Catalina Gravel

## 2017-12-19 NOTE — Anesthesia Procedure Notes (Signed)
Procedure Name: Intubation Date/Time: 12/19/2017 8:02 AM Performed by: Lavina Hamman, CRNA Pre-anesthesia Checklist: Patient identified, Emergency Drugs available, Suction available, Patient being monitored and Timeout performed Patient Re-evaluated:Patient Re-evaluated prior to induction Oxygen Delivery Method: Circle system utilized Preoxygenation: Pre-oxygenation with 100% oxygen Induction Type: IV induction Ventilation: Mask ventilation without difficulty Laryngoscope Size: Mac and 4 Grade View: Grade I Tube type: Oral Tube size: 7.0 mm Number of attempts: 1 Airway Equipment and Method: Stylet Placement Confirmation: ETT inserted through vocal cords under direct vision,  positive ETCO2,  CO2 detector and breath sounds checked- equal and bilateral Secured at: 21 cm Tube secured with: Tape Dental Injury: Teeth and Oropharynx as per pre-operative assessment

## 2017-12-19 NOTE — Anesthesia Procedure Notes (Addendum)
Anesthesia Regional Block: Interscalene brachial plexus block   Pre-Anesthetic Checklist: ,, timeout performed, Correct Patient, Correct Site, Correct Laterality, Correct Procedure, Correct Position, site marked, Risks and benefits discussed,  Surgical consent,  Pre-op evaluation,  At surgeon's request and post-op pain management  Laterality: Right  Prep: chloraprep       Needles:  Injection technique: Single-shot  Needle Type: Echogenic Needle     Needle Length: 5cm  Needle Gauge: 21     Additional Needles:   Procedures:,,,, ultrasound used (permanent image in chart),,,,  Narrative:  Start time: 12/19/2017 7:03 AM End time: 12/19/2017 7:08 AM Injection made incrementally with aspirations every 5 mL.  Performed by: Personally  Anesthesiologist: Catalina Gravel, MD  Additional Notes: No pain on injection. No increased resistance to injection. Injection made in 5cc increments.  Good needle visualization.  Patient tolerated procedure well.

## 2017-12-19 NOTE — Transfer of Care (Signed)
Immediate Anesthesia Transfer of Care Note  Patient: [name]  Procedure(s) Performed: Procedure(s): RIGHT TOTAL SHOULDER ARTHROPLASTY (Right)  Patient Location: PACU  Anesthesia Type:GA combined with regional for post-op pain  Level of Consciousness:  sedated, patient cooperative and responds to stimulation  Airway & Oxygen Therapy:Patient Spontanous Breathing and Patient connected to face mask oxgen  Post-op Assessment:  Report given to PACU RN and Post -op Vital signs reviewed and stable  Post vital signs:  Reviewed and stable  Last Vitals:  Vitals:   12/19/17 0600  BP: (!) 145/72  Pulse: 63  Resp: 18  Temp: 36.7 C  SpO2: 22%    Complications: No apparent anesthesia complications

## 2017-12-19 NOTE — H&P (Signed)
Angela Estrada is an 67 y.o. female.   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  Pain interferes with sleep and quality of life.  Past Medical History:  Diagnosis Date  . Abdominal hernia    RLQ  . Allergy   . Arthritis   . Arthritis   . HYPERCHOLESTEROLEMIA 04/25/2007   takes Pravastatin daily  . HYPERTENSION 01/03/2007   takes Losartan-HCTZ  and Amlodipine daily  . Hypokalemia    takes Potassium daily  . Joint pain   . Nocturia   . Pneumonia 2 yrs ago  . Primary osteoarthritis of shoulder    Right    Past Surgical History:  Procedure Laterality Date  . CESAREAN SECTION  25 years ago  . CHOLECYSTECTOMY    . COLONOSCOPY    . DILATION AND CURETTAGE OF UTERUS  35 years ago  . HERNIA REPAIR    . INCISIONAL HERNIA REPAIR  02/21/2012   Procedure: LAPAROSCOPIC INCISIONAL HERNIA;  Surgeon: Harl Bowie, MD;  Location: [facility];  Service: General;  Laterality: N/A;  laparoscopic incisional hernia repair with mesh  . JOINT REPLACEMENT     bilateral knee replacements  . REPLACEMENT TOTAL KNEE BILATERAL  2010  . TOTAL HIP ARTHROPLASTY Right 03/19/2016   Procedure: RIGHT TOTAL HIP ARTHROPLASTY ANTERIOR APPROACH;  Surgeon: Frederik Pear, MD;  Location: [facility];  Service: Orthopedics;  Laterality: Right;  . TOTAL HIP REVISION Right 10/02/2017   Procedure: TOTAL HIP REVISION POSTERIOR;  Surgeon: Frederik Pear, MD;  Location: [facility];  Service: Orthopedics;  Laterality: Right;  . TUBAL LIGATION      Family History  Problem Relation Age of Onset  . Diabetes Mother   . Heart disease Mother   . Hypertension Mother   . Stroke Mother   . Diabetes Father   . Heart disease Father        Triple Bypass  . Hypertension Father   . Heart attack Brother   . Congestive Heart Failure Brother   . Hypertension Brother   . Heart disease Brother   . Hypertension Sister   . Hypertension  Sister   . Ovarian cancer Sister 41  . Hypertension Sister   . Hypertension Sister   . Hypertension Sister   . Hypertension Sister   . Cancer Neg Hx   . Colon cancer Neg Hx    Social History:  reports that she has never smoked. She has never used smokeless tobacco. She reports that she does not drink alcohol or use drugs.  Allergies:  Allergies  Allergen Reactions  . Lisinopril Cough  . Oxycodone Other (See Comments)    Hallucinations    Medications Prior to Admission  Medication Sig Dispense Refill  . amLODipine (NORVASC) 2.5 MG tablet TAKE 1 TABLET BY MOUTH EVERY DAY 90 tablet 0  . aspirin EC 81 MG tablet Take 81 mg by mouth daily.    . cetirizine-pseudoephedrine (ZYRTEC-D) 5-120 MG tablet Take 1 tablet by mouth daily as needed for allergies.    Marland Kitchen ibuprofen (ADVIL,MOTRIN) 200 MG tablet Take 400 mg by mouth 2 (two) times daily as needed for moderate pain.    . mometasone (NASONEX) 50 MCG/ACT nasal spray Place 2 sprays into the nose daily as needed. As needed for nasal allergies (Patient taking differently: Place 2 sprays into the nose daily as needed (allergies). ) 17 g 11  . Multiple Vitamin (MULTIVITAMIN WITH MINERALS) TABS Take  1 tablet by mouth daily.    . potassium chloride SA (K-DUR,KLOR-CON) 20 MEQ tablet Take 1 tablet (20 mEq total) by mouth 2 (two) times daily. 90 tablet 3  . pravastatin (PRAVACHOL) 20 MG tablet Take 1 tablet (20 mg total) by mouth daily. (Patient taking differently: Take 20 mg by mouth every evening. ) 90 tablet 1  . telmisartan-hydrochlorothiazide (MICARDIS HCT) 80-25 MG tablet TAKE 1 TABLET BY MOUTH EVERY DAY 30 tablet 3  . aspirin EC 325 MG tablet Take 1 tablet (325 mg total) by mouth 2 (two) times daily. (Patient not taking: Reported on 12/05/2017) 30 tablet 0  . tiZANidine (ZANAFLEX) 2 MG tablet Take 1 tablet (2 mg total) by mouth every 6 (six) hours as needed. (Patient not taking: Reported on 12/05/2017) 60 tablet 0    Results for orders placed or  performed during the hospital encounter of 12/19/17 (from the past 48 hour(s))  Type and screen [place]     Status: None (Preliminary result)   Collection Time: 12/19/17  6:20 AM  Result Value Ref Range   ABO/RH(D) A POS    Antibody Screen PENDING    Sample Expiration      12/22/2017 Performed at [facility], [place] [address], [place], [place] [address]    No results found.  Review of Systems  All other systems reviewed and are negative.   Blood pressure (!) 145/72, pulse 63, temperature 98 F (36.7 C), temperature source Oral, resp. rate 18, height 5\' 3"  (1.6 m), weight 99.3 kg (219 lb), last menstrual period 08/30/1998, SpO2 99 %. Physical Exam  Constitutional: She is oriented to person, place, and time. She appears well-developed and well-nourished.  HENT:  Head: Atraumatic.  Eyes: EOM are normal.  Cardiovascular: Intact distal pulses.  Respiratory: Effort normal.  Musculoskeletal:  R shoulder pain with limited ROM. NVID  Neurological: She is alert and oriented to person, place, and time.  Skin: Skin is warm and dry.  Psychiatric: She has a normal mood and affect.     Assessment/Plan R shoulder endstage OA, failed nonop treatment Plan R TSA Risks / benefits of surgery discussed Consent on chart  NPO for OR Preop antibiotics   Isabella Stalling, MD 12/19/2017, 7:13 AM

## 2017-12-19 NOTE — Op Note (Signed)
Procedure(s): RIGHT TOTAL SHOULDER ARTHROPLASTY Procedure Note  Angela Estrada female 67 y.o. 12/19/2017  Procedure(s) and Anesthesia Type:    * RIGHT TOTAL SHOULDER ARTHROPLASTY - Choice  Surgeon(s) and Role:    Tania Ade, MD - Primary   Indications:  67 y.o. female  With endstage right shoulder arthritis. Pain and dysfunction interfered with quality of life and nonoperative treatment with activity modification, NSAIDS and injections failed.     Surgeon: Isabella Stalling   Assistants: Jeanmarie Hubert PA-C [facility] was present and scrubbed throughout the procedure and was essential in positioning, retraction, exposure, and closure)   Anesthesia: General endotracheal anesthesia with preoperative interscalene block given by the attending anesthesiologist  Shows  Procedure Detail  RIGHT TOTAL SHOULDER ARTHROPLASTY  Findings: Tornier flex anatomic press-fit size 1 stem with a 43 head, cemented size 23M Cortiloc glenoid.   A lesser tuberosity osteotomy was performed and repaired at the conclusion of the procedure.  Estimated Blood Loss:  300 mL         Drains: None   Blood Given: none          Specimens: none        Complications:  * No complications entered in OR log *         Disposition: PACU - hemodynamically stable.         Condition: stable    Procedure:   The patient was identified in the preoperative holding area where I personally marked the operative extremity after verifying with the patient and consent. She  was taken to the operating room where She was transferred to the   operative table.  The patient received an interscalene block in   the holding area by the attending anesthesiologist.  General anesthesia was induced   in the operating room without complication.  The patient did receive IV  Ancef prior to the commencement of the procedure.  The patient was   placed in the beach-chair position with the back raised about 30   degrees.   The nonoperative extremity and head and neck were carefully   positioned and padded protecting against neurovascular compromise.  The   left upper extremity was then prepped and draped in the standard sterile   fashion.    The appropriate operative time-out was performed with   Anesthesia, the perioperative staff, as well as myself and we all agreed   that the right side was the correct operative site.  The patient received 1 g IV tranexamic acid at the start of the case around time of the incision. An approximately   10 cm incision was made from the tip of the coracoid to the center point of the   humerus at the level of the axilla.  Dissection was carried down sharply   through subcutaneous tissues and cephalic vein was identified and taken   laterally with the deltoid.  The pectoralis major was taken medially.  The   upper 1 cm of the pectoralis major was released from its attachment on   the humerus.  The clavipectoral fascia was incised just lateral to the   conjoined tendon.  This incision was carried up to but not into the   coracoacromial ligament.  Digital palpation was used to prove   integrity of the axillary nerve which was protected throughout the   procedure.  Musculocutaneous nerve was not palpated in the operative   field.  Conjoined tendon was then retracted gently medially and the  deltoid laterally.  Anterior circumflex humeral vessels were clamped and   coagulated.  The soft tissues overlying the biceps was incised and this   incision was carried across the transverse humeral ligament to the base   of the coracoid.  The biceps was noted to be severely degenerated. It was released from the superior labrum. The biceps was then tenodesed to the soft tissue just above   pectoralis major and the remaining portion of the biceps superiorly was   excised.  An osteotomy was performed at the lesser tuberosity.  The capsule was then   released all the way down to the 6 o'clock  position of the humeral head.   The humeral head was then delivered with simultaneous adduction,   extension and external rotation.  All humeral osteophytes were removed   and the anatomic neck of the humerus was marked and cut free hand at   approximately 25 degrees retroversion within about 3 mm of the cuff   reflection posteriorly.  The head size was estimated to be a 43 medium   offset.  At that point, the humeral head was retracted posteriorly with   a Fukuda retractor.  There is fairly significant degeneration of the head and glenoid with severe synovitis and pannus consistent with an inflammatory arthropathy.  Remaining portion of the capsule was released at the base of the   coracoid.  The remaining biceps anchor and the entire anterior-inferior   labrum was excised.  The posterior labrum was also excised but the   posterior capsule was not released.  The guidepin was placed bicortically with non elevated guide.  The reamer was used to ream to concentric bone with punctate bleeding.  This gave an excellent concentric surface.  The center hole was then drilled for an anchor peg glenoid followed by the three peripheral holes and none of the holes   exited the glenoid wall.  I then pulse irrigated these holes and dried   them with Surgicel.  The three peripheral holes were then   pressurized cemented and the anchor peg glenoid was placed and impacted   with an excellent fit.  The glenoid was a 39M component.  The proximal humerus was then again exposed taking care not to displace the glenoid.    The entry awl was used followed by sounding reamers and then sequentially broached from size 1. This was then left in place and the calcar planer was used. Trial head was placed with a 43.  With the trial implantation of the component,  there was approximately 50% posterior translation with immediate snap back to the   anatomic position.  With forward elevation, there was no tendency   towards  posterior subluxation.   The trial was removed and the final implant was prepared on a back table.  The trial was removed and the final implant was prepared on a back table.   3 small holes were drilled on the medial side of the lesser tuberosity osteotomy, through which 2 labral tapes were passed. The implant was then placed through the loop of the 2 labral tapes and impacted with an excellent press-fit. This achieved excellent anatomic reconstruction of the proximal humerus.  The joint was then copiously irrigated with pulse lavage.  The subscapularis and   lesser tuberosity osteotomy were then repaired using the 2 labral tapes previously passed in a double row fashion with horizontal mattress sutures medially brought over through bone tunnels tied over a bone bridge laterally.  One #1 Ethibond was placed at the rotator interval just above   the lesser tuberosity. Copious irrigation was used. Skin was closed with 2-0 Vicryl sutures in the deep dermal layer and 4-0 Monocryl in a subcuticular  running fashion.  Sterile dressings were then applied including Aquacel.  The patient was placed in a sling and allowed to awaken from general anesthesia and taken to the recovery room in stable condition.      POSTOPERATIVE PLAN:  Early passive range of motion will be allowed with the goal of 0 degrees external rotation and 90 degrees forward elevation.  No internal rotation at this time.  No active motion of the arm until the lesser tuberosity heals.  The patient will likely be kept in the hospital for 1-2 days and then discharged home.

## 2017-12-19 NOTE — Discharge Instructions (Signed)
Discharge Instructions after Total Shoulder Arthroplasty ° ° °A sling has been provided for you. Remove the sling 5 times each day to perform motion exercises. You should use the sling as a protective device, if you are in a crowd.  °Use ice on the shoulder intermittently over the first 48 hours after surgery.  °Pain medication has been prescribed for you.  °Use your medication liberally over the first 48 hours, and then begin to taper your use. You may take Extra Strength Tylenol or Tylenol only in place of the pain pills. DO NOT take ANY nonsteroidal anti-inflammatory pain medications: Advil, Motrin, Ibuprofen, Aleve, Naproxen, or Naprosyn. °Take one aspirin a day for 2 weeks after surgery, unless you have an aspirin sensitivity/allergy or asthma. °Leave your dressing on until your first follow up visit.  You may shower with the dressing.  Hold your arm as if you still have your sling on while you shower. °Active reaching and lifting are not permitted. You may use the operative arm for activities of daily living that do not require the operative arm to leave the side of the body, such as eating, drinking, bathing, etc.  °Three to 5 times each day you should perform assisted overhead reaching and external rotation (outward turning) exercises with the operative arm. You were taught these exercises prior to discharge. Both exercises should be done with the non-operative arm used as the "therapist arm" while the operative arm remains relaxed. Ten of each exercise should be done three to five times each day. ° ° °Overhead reach is helping to lift your stiff arm up as high as it will go. To stretch your overhead reach, lie flat on your back, relax, and grasp the wrist of the tight shoulder with your opposite hand. Using the power in your opposite arm, bring the stiff arm up as far as it is comfortable. Start holding it for ten seconds and then work up to where you can hold it for a count of 30. Breathe slowly and deeply  while the arm is moved. Repeat this stretch ten times, trying to help the ar up a little higher each time.  ° ° ° °External rotation is turning the arm out to the side while your elbow stays close to your body. External rotation is best stretched while you are lying on your back. Hold a cane, yardstick, broom handle, or dowel in both hands. Bend both elbows to a right angle. Use steady, gentle force from your normal arm to rotate the hand of the stiff shoulder out away from your body. Continue the rotation as far as it will go comfortably, holding it there for a count of 10. Repeat this exercise ten times.  ° ° ° ° °Please call [phone] during normal business hours or [phone] after hours for any problems. Including the following: ° °- excessive redness of the incisions °- drainage for more than 4 days °- fever of more than 101.5 F ° °*Please note that pain medications will not be refilled after hours or on weekends. ° ° ° °

## 2017-12-19 NOTE — Anesthesia Preprocedure Evaluation (Addendum)
Anesthesia Evaluation  Patient identified by MRN, date of birth, ID band Patient awake    Reviewed: Allergy & Precautions, NPO status , Patient's Chart, lab work & pertinent test results  Airway Mallampati: II  TM Distance: >3 FB Neck ROM: Full    Dental  (+) Edentulous Lower, Edentulous Upper   Pulmonary neg pulmonary ROS,    Pulmonary exam normal breath sounds clear to auscultation       Cardiovascular hypertension, Pt. on medications (-) angina(-) CAD and (-) Past MI Normal cardiovascular exam Rhythm:Regular Rate:Normal     Neuro/Psych negative neurological ROS  negative psych ROS   GI/Hepatic negative GI ROS, Neg liver ROS,   Endo/Other  Obesity   Renal/GU negative Renal ROS     Musculoskeletal  (+) Arthritis , Osteoarthritis,    Abdominal   Peds  Hematology negative hematology ROS (+)   Anesthesia Other Findings Day of surgery medications reviewed with the patient.  Reproductive/Obstetrics                           Anesthesia Physical Anesthesia Plan  ASA: III  Anesthesia Plan: General   Post-op Pain Management:  Regional for Post-op pain   Induction: Intravenous  PONV Risk Score and Plan: 3 and Midazolam, Dexamethasone and Ondansetron  Airway Management Planned: Oral ETT  Additional Equipment:   Intra-op Plan:   Post-operative Plan: Extubation in OR  Informed Consent: I have reviewed the patients History and Physical, chart, labs and discussed the procedure including the risks, benefits and alternatives for the proposed anesthesia with the patient or authorized representative who has indicated his/her understanding and acceptance.   Dental advisory given  Plan Discussed with: CRNA  Anesthesia Plan Comments:         Anesthesia Quick Evaluation

## 2017-12-20 ENCOUNTER — Encounter (HOSPITAL_COMMUNITY): Payer: Self-pay | Admitting: Orthopedic Surgery

## 2017-12-20 LAB — BASIC METABOLIC PANEL
Anion gap: 6 (ref 5–15)
BUN: 11 mg/dL (ref 8–23)
CO2: 26 mmol/L (ref 22–32)
Calcium: 8.6 mg/dL — ABNORMAL LOW (ref 8.9–10.3)
Chloride: 106 mmol/L (ref 98–111)
Creatinine, Ser: 0.54 mg/dL (ref 0.44–1.00)
GFR calc Af Amer: >60 mL/min (ref >60)
GFR calc non Af Amer: >60 mL/min (ref >60)
Glucose, Bld: 132 mg/dL — ABNORMAL HIGH (ref 70–99)
Potassium: 3.8 mmol/L (ref 3.5–5.1)
Sodium: 138 mmol/L (ref 135–145)

## 2017-12-20 LAB — CBC
HCT: 34 % — ABNORMAL LOW (ref 36.0–46.0)
Hemoglobin: 11.5 g/dL — ABNORMAL LOW (ref 12.0–15.0)
MCH: 30.4 pg (ref 26.0–34.0)
MCHC: 33.8 g/dL (ref 30.0–36.0)
MCV: 89.9 fL (ref 78.0–100.0)
Platelets: 268 10*3/uL (ref 150–400)
RBC: 3.78 MIL/uL — ABNORMAL LOW (ref 3.87–5.11)
RDW: 13.4 % (ref 11.5–15.5)
WBC: 10.5 10*3/uL (ref 4.0–10.5)

## 2017-12-20 MED ORDER — HYDROCODONE-ACETAMINOPHEN 5-325 MG PO TABS: 1.0000 | ORAL_TABLET | ORAL | 0 refills | Status: DC | PRN

## 2017-12-20 MED ORDER — TIZANIDINE HCL 2 MG PO TABS: 2.0000 mg | ORAL_TABLET | Freq: Four times a day (QID) | ORAL | 0 refills | Status: DC | PRN

## 2017-12-20 NOTE — Evaluation (Signed)
Occupational Therapy Evaluation Patient Details Name: Angela Estrada MRN: [id number] DOB: 1950-07-19 Today's Date: 12/20/2017    History of Present Illness  RIGHT TOTAL SHOULDER ARTHROPLASTY    Clinical Impression   OT education complete.  Husband will A pt as needed.  Hand out provided    Follow Up Recommendations  Follow surgeon's recommendation for DC plan and follow-up therapies    Equipment Recommendations  None recommended by OT    Recommendations for Other Services       Precautions / Restrictions Precautions Precautions: Shoulder Shoulder Interventions: Shoulder sling/immobilizer;Off for dressing/bathing/exercises Precaution Comments: ER to neutral, elbow, wrist and hand ROM ok Required Braces or Orthoses: Sling Restrictions Weight Bearing Restrictions: Yes RUE Weight Bearing: Non weight bearing      Mobility Bed Mobility Overal bed mobility: Needs Assistance Bed Mobility: Supine to Sit     Supine to sit: Min assist        Transfers Overall transfer level: Needs assistance Equipment used: Straight cane Transfers: Sit to/from [facility] Sit to Stand: Min assist Stand pivot transfers: Min assist                ADL either performed or assessed with clinical judgement   ADL Overall ADL's : Needs assistance/impaired                                     Functional mobility during ADLs: Minimal assistance;Cane General ADL Comments: Gait belt issued and husband educated in use     Vision Patient Visual Report: No change from baseline              Pertinent Vitals/Pain Pain Assessment: No/denies pain(nerve block still active)        Extremity/Trunk Assessment Upper Extremity Assessment Upper Extremity Assessment: RUE deficits/detail RUE Deficits / Details: s/p SX           Communication Communication Communication: No difficulties   Cognition Arousal/Alertness: Awake/alert Behavior During  Therapy: WFL for tasks assessed/performed Overall Cognitive Status: Within Functional Limits for tasks assessed                                           Shoulder Instructions Shoulder Instructions Donning/doffing shirt without moving shoulder: Minimal assistance;Caregiver independent with task Method for sponge bathing under operated UE: Minimal assistance;Caregiver independent with task Donning/doffing sling/immobilizer: Minimal assistance;Caregiver independent with task Correct positioning of sling/immobilizer: Minimal assistance;Caregiver independent with task ROM for elbow, wrist and digits of operated UE: Minimal assistance;Caregiver independent with task Sling wearing schedule (on at all times/off for ADL's): Supervision/safety;Caregiver independent with task Proper positioning of operated UE when showering: Minimal assistance;Caregiver independent with task Positioning of UE while sleeping: Minimal assistance;Caregiver independent with task    Home Living Family/patient expects to be discharged to:: Private residence Living Arrangements: Spouse/significant other Available Help at Discharge: Family;Available 24 hours/day Type of Home: House Home Access: Stairs to enter   Entrance Stairs-Rails: Right       Bathroom Shower/Tub: Tub/shower unit;Walk-in shower   Bathroom Toilet: Standard     Home Equipment: Cane - single point;Bedside commode          Prior Functioning/Environment Level of Independence: Independent with assistive device(s);Needs assistance  OT Goals(Current goals can be found in the care plan section) Acute Rehab OT Goals Patient Stated Goal: get well OT Goal Formulation: With patient Potential to Achieve Goals: Good  OT Frequency:      AM-PAC PT "6 Clicks" Daily Activity     Outcome Measure Help from another person eating meals?: None Help from another person taking care of personal grooming?: A  Little Help from another person toileting, which includes using toliet, bedpan, or urinal?: A Little Help from another person bathing (including washing, rinsing, drying)?: A Little Help from another person to put on and taking off regular upper body clothing?: A Little Help from another person to put on and taking off regular lower body clothing?: A Little 6 Click Score: 19   End of Session Equipment Utilized During Treatment: Gait belt Nurse Communication: Mobility status  Activity Tolerance: Patient tolerated treatment well;No increased pain Patient left: in chair;with call bell/phone within reach;with family/visitor present                   Time: 4628-6381 OT Time Calculation (min): 24 min Charges:  OT General Charges $OT Visit: 1 Visit OT Evaluation $OT Eval Moderate Complexity: 1 Mod OT Treatments $Self Care/Home Management : 8-22 mins G-Codes:     Kari Baars, OT [phone]  Payton Mccallum D 12/20/2017, 10:46 AM

## 2017-12-20 NOTE — Discharge Summary (Signed)
Patient ID: Angela Estrada MRN: [id number] DOB/AGE: 65-Apr-1952 65 y.o.  Admit date: 12/19/2017 Discharge date: 12/20/2017  Admission Diagnoses:  Active Problems:   Status post total shoulder arthroplasty, right   Discharge Diagnoses:  Same  Past Medical History:  Diagnosis Date  . Abdominal hernia    RLQ  . Allergy   . Arthritis   . Arthritis   . HYPERCHOLESTEROLEMIA 04/25/2007   takes Pravastatin daily  . HYPERTENSION 01/03/2007   takes Losartan-HCTZ  and Amlodipine daily  . Hypokalemia    takes Potassium daily  . Joint pain   . Nocturia   . Pneumonia 2 yrs ago  . Primary osteoarthritis of shoulder    Right    Surgeries: Procedure(s): RIGHT TOTAL SHOULDER ARTHROPLASTY on 12/19/2017   Consultants:   Discharged Condition: Improved  Hospital Course: Angela Estrada is an 67 y.o. female who was admitted 12/19/2017 for operative treatment of right shoulder OA. Patient has severe unremitting pain that affects sleep, daily activities, and work/hobbies. After pre-op clearance the patient was taken to the operating room on 12/19/2017 and underwent  Procedure(s): RIGHT TOTAL SHOULDER ARTHROPLASTY.    Patient was given perioperative antibiotics:  Anti-infectives (From admission, onward)   Start     Dose/Rate Route Frequency Ordered Stop   12/19/17 1400  ceFAZolin (ANCEF) IVPB 2g/100 mL premix     2 g 200 mL/hr over 30 Minutes Intravenous Every 6 hours 12/19/17 1032 12/20/17 0254   12/19/17 0600  ceFAZolin (ANCEF) IVPB 2g/100 mL premix     2 g 200 mL/hr over 30 Minutes Intravenous On call to O.R. 12/19/17 0544 12/19/17 0749       Patient was given sequential compression devices, early ambulation, and chemoprophylaxis to prevent DVT.  Patient benefited maximally from hospital stay and there were no complications.    Recent vital signs:  Patient Vitals for the past 24 hrs:  BP Temp Temp src Pulse Resp SpO2  12/20/17 0611 138/66 97.9 F (36.6 C) Oral 72 - 96 %   12/20/17 0141 135/68 98.5 F (36.9 C) Oral 80 16 94 %  12/19/17 2100 (!) 152/66 98.1 F (36.7 C) Oral 95 16 98 %  12/19/17 1710 (!) 142/73 98.2 F (36.8 C) Oral 84 15 96 %  12/19/17 1334 (!) 124/59 (!) 97.4 F (36.3 C) Oral 81 14 96 %  12/19/17 1222 133/88 (!) 97.5 F (36.4 C) Oral 78 14 99 %  12/19/17 1135 118/71 - - 72 14 100 %  12/19/17 1028 (!) 152/80 97.6 F (36.4 C) Oral 65 16 96 %  12/19/17 1015 128/72 97.7 F (36.5 C) - 66 13 99 %  12/19/17 1000 138/74 - - 74 13 100 %  12/19/17 0945 139/73 - - 81 14 97 %  12/19/17 0942 (!) 144/80 98 F (36.7 C) - 88 14 95 %     Recent laboratory studies:  Recent Labs    12/20/17 0530  WBC 10.5  HGB 11.5*  HCT 34.0*  PLT 268  NA 138  K 3.8  CL 106  CO2 26  BUN 11  CREATININE 0.54  GLUCOSE 132*  CALCIUM 8.6*     Discharge Medications:   Allergies as of 12/20/2017      Reactions   Lisinopril Cough   Oxycodone Other (See Comments)   Hallucinations      Medication List    STOP taking these medications   ibuprofen 200 MG tablet Commonly known as:  ADVIL,MOTRIN     TAKE  these medications   amLODipine 2.5 MG tablet Commonly known as:  NORVASC TAKE 1 TABLET BY MOUTH EVERY DAY   aspirin EC 325 MG tablet Take 1 tablet (325 mg total) by mouth 2 (two) times daily. What changed:  Another medication with the same name was removed. Continue taking this medication, and follow the directions you see here.   cetirizine-pseudoephedrine 5-120 MG tablet Commonly known as:  ZYRTEC-D Take 1 tablet by mouth daily as needed for allergies.   HYDROcodone-acetaminophen 5-325 MG tablet Commonly known as:  NORCO Take 1-2 tablets by mouth every 4 (four) hours as needed for moderate pain.   mometasone 50 MCG/ACT nasal spray Commonly known as:  NASONEX Place 2 sprays into the nose daily as needed. As needed for nasal allergies What changed:    reasons to take this  additional instructions   multivitamin with minerals Tabs  tablet Take 1 tablet by mouth daily.   potassium chloride SA 20 MEQ tablet Commonly known as:  K-DUR,KLOR-CON Take 1 tablet (20 mEq total) by mouth 2 (two) times daily.   pravastatin 20 MG tablet Commonly known as:  PRAVACHOL Take 1 tablet (20 mg total) by mouth daily. What changed:  when to take this   telmisartan-hydrochlorothiazide 80-25 MG tablet Commonly known as:  MICARDIS HCT TAKE 1 TABLET BY MOUTH EVERY DAY   tiZANidine 2 MG tablet Commonly known as:  ZANAFLEX Take 1 tablet (2 mg total) by mouth every 6 (six) hours as needed.       Diagnostic Studies: Ct Shoulder Right Wo Contrast  Result Date: 12/16/2017 CLINICAL DATA:  Preop for right shoulder arthroplasty. Shoulder pain and limited range of motion. EXAM: CT OF THE UPPER RIGHT EXTREMITY WITHOUT CONTRAST TECHNIQUE: Multidetector CT imaging of the upper right extremity was performed according to the standard protocol. COMPARISON:  None. FINDINGS: Advanced glenohumeral joint degenerative changes with joint space narrowing, osteophytic spurring, subchondral cystic change and areas of bony eburnation. The glenoid is slightly widened and slightly thinned. No obvious loose bodies in the joint. The [facility] joint is intact. No significant degenerative changes. The acromion is type 1-2 in shape. No significant lateral downsloping or undersurface spurring. The rotator cuff tendons are grossly intact. The right lung is clear of acute process. No worrisome pulmonary lesions. IMPRESSION: Advanced glenohumeral joint degenerative changes as detailed above. No acute or other significant bony findings. Grossly intact rotator cuff tendons. Electronically Signed   By: Marijo Sanes M.D.   On: 12/16/2017 13:42   Dg Shoulder Right Port  Result Date: 12/19/2017 CLINICAL DATA:  Status post total shoulder arthroplasty EXAM: PORTABLE RIGHT SHOULDER: 1 V COMPARISON:  None. FINDINGS: Y scapular image obtained. Total shoulder prosthesis appears well-seated. No  fracture or dislocation evident. Soft tissue air in the joint is an expected postoperative finding. Visualized right lung clear. IMPRESSION: Prosthetic component well-seated on single view. No fracture or dislocation evident. Electronically Signed   By: Lowella Grip III M.D.   On: 12/19/2017 10:21    Disposition:     Follow-up Information    Tania Ade, MD. Schedule an appointment as soon as possible for a visit in 2 weeks.   Specialty:  Orthopedic Surgery Contact information: [address] [place] [address] [phone]            Signed: Grier Mitts 12/20/2017, 8:13 AM

## 2017-12-20 NOTE — Progress Notes (Signed)
   PATIENT ID: [name]   1 Day Post-Op Procedure(s) (LRB): RIGHT TOTAL SHOULDER ARTHROPLASTY (Right)  Subjective: Doing well, minimal pain. Hopeful to go home today.   Objective:  Vitals:   12/20/17 0141 12/20/17 0611  BP: 135/68 138/66  Pulse: 80 72  Resp: 16   Temp: 98.5 F (36.9 C) 97.9 F (36.6 C)  SpO2: 94% 96%    R UE dressing c/d/i Wiggles fingers, distally NVI  Labs:  Recent Labs    12/20/17 0530  HGB 11.5*   Recent Labs    12/20/17 0530  WBC 10.5  RBC 3.78*  HCT 34.0*  PLT 268   Recent Labs    12/20/17 0530  NA 138  K 3.8  CL 106  CO2 26  BUN 11  CREATININE 0.54  GLUCOSE 132*  CALCIUM 8.6*    Assessment and Plan: 1 day s/p R TSA OT- PROM goal to 90RR , 0 ER, hand wrist elbow ROM Sling D/c home today when cleared by OT Scripts signed in chart  VTE proph: asa, scds

## 2017-12-20 NOTE — Progress Notes (Signed)
RN reviewed discharge instructions with patient and family. All questions answered.   Paperwork and prescriptions given to patient.   NT rolled patient down with all belongings to family car.  

## 2017-12-30 NOTE — Addendum Note (Signed)
Addendum  created 12/30/17 1311 by Catalina Gravel, MD   Intraprocedure Blocks edited, Sign clinical note

## 2018-01-01 DIAGNOSIS — M19011 Primary osteoarthritis, right shoulder: Secondary | ICD-10-CM | POA: Diagnosis not present

## 2018-01-08 ENCOUNTER — Encounter

## 2018-01-29 DIAGNOSIS — M19011 Primary osteoarthritis, right shoulder: Secondary | ICD-10-CM | POA: Diagnosis not present

## 2018-01-29 DIAGNOSIS — Z96611 Presence of right artificial shoulder joint: Secondary | ICD-10-CM | POA: Diagnosis not present

## 2018-01-29 DIAGNOSIS — Z471 Aftercare following joint replacement surgery: Secondary | ICD-10-CM | POA: Diagnosis not present

## 2018-02-06 DIAGNOSIS — Z96611 Presence of right artificial shoulder joint: Secondary | ICD-10-CM | POA: Diagnosis not present

## 2018-02-06 DIAGNOSIS — M25511 Pain in right shoulder: Secondary | ICD-10-CM | POA: Diagnosis not present

## 2018-02-06 DIAGNOSIS — Z471 Aftercare following joint replacement surgery: Secondary | ICD-10-CM | POA: Diagnosis not present

## 2018-02-12 DIAGNOSIS — M25511 Pain in right shoulder: Secondary | ICD-10-CM | POA: Diagnosis not present

## 2018-02-12 DIAGNOSIS — Z471 Aftercare following joint replacement surgery: Secondary | ICD-10-CM | POA: Diagnosis not present

## 2018-02-12 DIAGNOSIS — Z96611 Presence of right artificial shoulder joint: Secondary | ICD-10-CM | POA: Diagnosis not present

## 2018-02-13 DIAGNOSIS — Z96611 Presence of right artificial shoulder joint: Secondary | ICD-10-CM | POA: Diagnosis not present

## 2018-02-13 DIAGNOSIS — M25511 Pain in right shoulder: Secondary | ICD-10-CM | POA: Diagnosis not present

## 2018-02-13 DIAGNOSIS — Z471 Aftercare following joint replacement surgery: Secondary | ICD-10-CM | POA: Diagnosis not present

## 2018-02-17 DIAGNOSIS — M25511 Pain in right shoulder: Secondary | ICD-10-CM | POA: Diagnosis not present

## 2018-02-17 DIAGNOSIS — Z471 Aftercare following joint replacement surgery: Secondary | ICD-10-CM | POA: Diagnosis not present

## 2018-02-17 DIAGNOSIS — Z96611 Presence of right artificial shoulder joint: Secondary | ICD-10-CM | POA: Diagnosis not present

## 2018-02-19 DIAGNOSIS — M25511 Pain in right shoulder: Secondary | ICD-10-CM | POA: Diagnosis not present

## 2018-02-19 DIAGNOSIS — Z471 Aftercare following joint replacement surgery: Secondary | ICD-10-CM | POA: Diagnosis not present

## 2018-02-19 DIAGNOSIS — Z96611 Presence of right artificial shoulder joint: Secondary | ICD-10-CM | POA: Diagnosis not present

## 2018-02-24 DIAGNOSIS — M25511 Pain in right shoulder: Secondary | ICD-10-CM | POA: Diagnosis not present

## 2018-02-24 DIAGNOSIS — Z96611 Presence of right artificial shoulder joint: Secondary | ICD-10-CM | POA: Diagnosis not present

## 2018-02-24 DIAGNOSIS — Z471 Aftercare following joint replacement surgery: Secondary | ICD-10-CM | POA: Diagnosis not present

## 2018-02-26 DIAGNOSIS — M25511 Pain in right shoulder: Secondary | ICD-10-CM | POA: Diagnosis not present

## 2018-02-26 DIAGNOSIS — Z96611 Presence of right artificial shoulder joint: Secondary | ICD-10-CM | POA: Diagnosis not present

## 2018-02-26 DIAGNOSIS — Z471 Aftercare following joint replacement surgery: Secondary | ICD-10-CM | POA: Diagnosis not present

## 2018-02-27 ENCOUNTER — Ambulatory Visit: Payer: Medicare HMO | Admitting: Family Medicine

## 2018-03-03 DIAGNOSIS — Z96611 Presence of right artificial shoulder joint: Secondary | ICD-10-CM | POA: Diagnosis not present

## 2018-03-03 DIAGNOSIS — M25511 Pain in right shoulder: Secondary | ICD-10-CM | POA: Diagnosis not present

## 2018-03-03 DIAGNOSIS — Z471 Aftercare following joint replacement surgery: Secondary | ICD-10-CM | POA: Diagnosis not present

## 2018-03-05 DIAGNOSIS — M25511 Pain in right shoulder: Secondary | ICD-10-CM | POA: Diagnosis not present

## 2018-03-05 DIAGNOSIS — Z96611 Presence of right artificial shoulder joint: Secondary | ICD-10-CM | POA: Diagnosis not present

## 2018-03-05 DIAGNOSIS — Z471 Aftercare following joint replacement surgery: Secondary | ICD-10-CM | POA: Diagnosis not present

## 2018-03-06 DIAGNOSIS — H524 Presbyopia: Secondary | ICD-10-CM | POA: Diagnosis not present

## 2018-03-06 DIAGNOSIS — H5203 Hypermetropia, bilateral: Secondary | ICD-10-CM | POA: Diagnosis not present

## 2018-03-11 DIAGNOSIS — Z96611 Presence of right artificial shoulder joint: Secondary | ICD-10-CM | POA: Diagnosis not present

## 2018-03-11 DIAGNOSIS — M25511 Pain in right shoulder: Secondary | ICD-10-CM | POA: Diagnosis not present

## 2018-03-11 DIAGNOSIS — Z471 Aftercare following joint replacement surgery: Secondary | ICD-10-CM | POA: Diagnosis not present

## 2018-03-12 ENCOUNTER — Other Ambulatory Visit: Payer: Self-pay | Admitting: Endocrinology

## 2018-03-12 DIAGNOSIS — Z9889 Other specified postprocedural states: Secondary | ICD-10-CM | POA: Diagnosis not present

## 2018-03-13 ENCOUNTER — Ambulatory Visit (INDEPENDENT_AMBULATORY_CARE_PROVIDER_SITE_OTHER): Payer: Medicare HMO | Admitting: Family Medicine

## 2018-03-13 ENCOUNTER — Encounter: Payer: Self-pay | Admitting: Family Medicine

## 2018-03-13 ENCOUNTER — Other Ambulatory Visit: Payer: Self-pay

## 2018-03-13 VITALS — BP 136/81 | HR 72 | Temp 97.8°F | Ht 63.5 in | Wt 226.0 lb

## 2018-03-13 DIAGNOSIS — Z23 Encounter for immunization: Secondary | ICD-10-CM

## 2018-03-13 DIAGNOSIS — I10 Essential (primary) hypertension: Secondary | ICD-10-CM

## 2018-03-13 DIAGNOSIS — M1611 Unilateral primary osteoarthritis, right hip: Secondary | ICD-10-CM

## 2018-03-13 DIAGNOSIS — E7800 Pure hypercholesterolemia, unspecified: Secondary | ICD-10-CM

## 2018-03-13 DIAGNOSIS — R7309 Other abnormal glucose: Secondary | ICD-10-CM

## 2018-03-13 MED ORDER — PRAVASTATIN SODIUM 20 MG PO TABS: 20.0000 mg | ORAL_TABLET | Freq: Every evening | ORAL | 3 refills | Status: DC

## 2018-03-13 MED ORDER — POTASSIUM CHLORIDE CRYS ER 20 MEQ PO TBCR: 20.0000 meq | EXTENDED_RELEASE_TABLET | Freq: Two times a day (BID) | ORAL | 3 refills | Status: DC

## 2018-03-13 MED ORDER — ASPIRIN 81 MG PO TBEC: 81.0000 mg | DELAYED_RELEASE_TABLET | Freq: Once | ORAL | Status: AC

## 2018-03-13 MED ORDER — AMLODIPINE BESYLATE 2.5 MG PO TABS: 2.5000 mg | ORAL_TABLET | Freq: Every day | ORAL | 1 refills | Status: DC

## 2018-03-13 MED ORDER — TELMISARTAN-HCTZ 80-25 MG PO TABS: 1.0000 | ORAL_TABLET | Freq: Every day | ORAL | 1 refills | Status: DC

## 2018-03-13 MED ORDER — TRIAMCINOLONE ACETONIDE 0.1 % EX CREA: 1.0000 "application " | TOPICAL_CREAM | Freq: Two times a day (BID) | CUTANEOUS | 0 refills | Status: DC

## 2018-03-13 NOTE — Progress Notes (Signed)
10/3/201911:33 AM  Angela Estrada 07-06-1950, 67 y.o. female [id number]  Chief Complaint  Patient presents with  . Establish Care    transiton of care from [facility]    HPI:   Patient is a 67 y.o. female with past medical history significant for hyperglycemia, HTN, OA right hip, HLP,  who presents today to establish care  Previous PCP Dr Loanne Drilling Ob Gyn Dr Josefa Half Goes to [facility], Dr Mayer Camel and Dr Tamera Punt  Uses cane, no falls Multiple joint surgeries, replacement Has a small insect bite of right forearm, itchy, requesting topical med Has upcoming mammogram Otherwise doing well on her current medications   Fall Risk  03/13/2018 06/08/2016  Falls in the past year? No No     Depression screen [facility] 2/9 03/13/2018 06/08/2016  Decreased Interest 0 0  Down, Depressed, Hopeless 0 0  PHQ - 2 Score 0 0    Allergies  Allergen Reactions  . Lisinopril Cough  . Oxycodone Other (See Comments)    Hallucinations    Prior to Admission medications   Medication Sig Start Date End Date Taking? Authorizing Provider  amLODipine (NORVASC) 2.5 MG tablet TAKE 1 TABLET BY MOUTH EVERY DAY 12/02/17  Yes Renato Shin, MD  aspirin EC 325 MG tablet Take 1 tablet (325 mg total) by mouth 2 (two) times daily. 10/02/17  Yes Joanell Rising K, PA-C  cetirizine-pseudoephedrine (ZYRTEC-D) 5-120 MG tablet Take 1 tablet by mouth daily as needed for allergies.   Yes [provider]  potassium chloride SA (K-DUR,KLOR-CON) 20 MEQ tablet Take 1 tablet (20 mEq total) by mouth 2 (two) times daily. 09/23/17  Yes Renato Shin, MD  pravastatin (PRAVACHOL) 20 MG tablet Take 1 tablet (20 mg total) by mouth daily. Patient taking differently: Take 20 mg by mouth every evening.  05/15/17  Yes Renato Shin, MD  telmisartan-hydrochlorothiazide (MICARDIS HCT) 80-25 MG tablet TAKE 1 TABLET BY MOUTH EVERY DAY 03/12/18  Yes Renato Shin, MD    Past Medical History:  Diagnosis Date  . Abdominal  hernia    RLQ  . Allergy   . Arthritis   . Arthritis   . HYPERCHOLESTEROLEMIA 04/25/2007   takes Pravastatin daily  . HYPERTENSION 01/03/2007   takes Losartan-HCTZ  and Amlodipine daily  . Hypokalemia    takes Potassium daily  . Joint pain   . Nocturia   . Pneumonia 2 yrs ago  . Primary osteoarthritis of shoulder    Right    Past Surgical History:  Procedure Laterality Date  . CESAREAN SECTION  25 years ago  . CHOLECYSTECTOMY    . COLONOSCOPY    . DILATION AND CURETTAGE OF UTERUS  35 years ago  . HERNIA REPAIR    . INCISIONAL HERNIA REPAIR  02/21/2012   Procedure: LAPAROSCOPIC INCISIONAL HERNIA;  Surgeon: Harl Bowie, MD;  Location: [facility];  Service: General;  Laterality: N/A;  laparoscopic incisional hernia repair with mesh  . JOINT REPLACEMENT     bilateral knee replacements  . REPLACEMENT TOTAL KNEE BILATERAL  2010  . TOTAL HIP ARTHROPLASTY Right 03/19/2016   Procedure: RIGHT TOTAL HIP ARTHROPLASTY ANTERIOR APPROACH;  Surgeon: Frederik Pear, MD;  Location: [facility];  Service: Orthopedics;  Laterality: Right;  . TOTAL HIP REVISION Right 10/02/2017   Procedure: TOTAL HIP REVISION POSTERIOR;  Surgeon: Frederik Pear, MD;  Location: [facility];  Service: Orthopedics;  Laterality: Right;  . TOTAL SHOULDER ARTHROPLASTY Right 12/19/2017   Procedure: RIGHT TOTAL SHOULDER ARTHROPLASTY;  Surgeon:  Tania Ade, MD;  Location: [facility];  Service: Orthopedics;  Laterality: Right;  . TUBAL LIGATION      Social History   Tobacco Use  . Smoking status: Never Smoker  . Smokeless tobacco: Never Used  Substance Use Topics  . Alcohol use: No    Alcohol/week: 0.0 standard drinks    Family History  Problem Relation Age of Onset  . Diabetes Mother   . Heart disease Mother   . Hypertension Mother   . Stroke Mother   . Diabetes Father   . Heart disease Father        Triple Bypass  . Hypertension Father   . Heart attack Brother   . Congestive Heart Failure Brother   . Hypertension  Brother   . Heart disease Brother   . Hypertension Sister   . Hypertension Sister   . Ovarian cancer Sister 22  . Hypertension Sister   . Hypertension Sister   . Hypertension Sister   . Hypertension Sister   . Cancer Neg Hx   . Colon cancer Neg Hx     Review of Systems  Constitutional: Negative for chills and fever.  Respiratory: Negative for cough and shortness of breath.   Cardiovascular: Negative for chest pain, palpitations and leg swelling.  Gastrointestinal: Negative for abdominal pain, nausea and vomiting.  Musculoskeletal: Positive for back pain and joint pain. Negative for falls.  Neurological: Positive for focal weakness.  All other systems reviewed and are negative.    OBJECTIVE:  Blood pressure 136/81, pulse 72, temperature 97.8 F (36.6 C), temperature source Oral, height 5' 3.5" (1.613 m), weight 226 lb (102.5 kg), last menstrual period 08/30/1998, SpO2 97 %. Body mass index is 39.41 kg/m.   Physical Exam  Constitutional: She is oriented to person, place, and time. She appears well-developed and well-nourished.  HENT:  Head: Normocephalic and atraumatic.  Mouth/Throat: Oropharynx is clear and moist. No oropharyngeal exudate.  Eyes: Pupils are equal, round, and reactive to light. Conjunctivae and EOM are normal. No scleral icterus.  Neck: Neck supple.  Cardiovascular: Normal rate, regular rhythm and normal heart sounds. Exam reveals no gallop and no friction rub.  No murmur heard. Pulmonary/Chest: Effort normal and breath sounds normal. She has no wheezes. She has no rales.  Musculoskeletal: She exhibits no edema.  Neurological: She is alert and oriented to person, place, and time.  Skin: Skin is warm and dry.  Right proximal forearm with mild erythema and swelling  Psychiatric: She has a normal mood and affect.  Nursing note and vitals reviewed.   ASSESSMENT and PLAN  1. Essential hypertension Controlled. Continue current regime.  - CBC -  Comprehensive metabolic panel  2. Need for prophylactic vaccination and inoculation against influenza - Flu vaccine HIGH DOSE PF (Fluzone High dose)  3. HYPERGLYCEMIA Checking labs today, medications will be adjusted as needed.  - TSH - Hemoglobin A1c  4. Primary osteoarthritis of right hip Stable. Uses cane. Managed by ortho  5. HYPERCHOLESTEROLEMIA Checking labs today, medications will be adjusted as needed.  - TSH - Lipid panel  Other orders - amLODipine (NORVASC) 2.5 MG tablet; Take 1 tablet (2.5 mg total) by mouth daily. - aspirin EC 81 MG EC tablet; Take 1 tablet (81 mg total) by mouth once for 1 dose. - potassium chloride SA (K-DUR,KLOR-CON) 20 MEQ tablet; Take 1 tablet (20 mEq total) by mouth 2 (two) times daily. - pravastatin (PRAVACHOL) 20 MG tablet; Take 1 tablet (20 mg total) by mouth every  evening. - telmisartan-hydrochlorothiazide (MICARDIS HCT) 80-25 MG tablet; Take 1 tablet by mouth daily. - triamcinolone cream (KENALOG) 0.1 %; Apply 1 application topically 2 (two) times daily.  Return in about 6 months (around 09/12/2018).    Rutherford Guys, MD [facility] at [facility] [place], [place] [address] Ph.  [phone] Fax [phone]

## 2018-03-13 NOTE — Patient Instructions (Signed)
° ° ° °  If you have lab work done today you will be contacted with your lab results within the next 2 weeks.  If you have not heard from us then please contact us. The fastest way to get your results is to register for My Chart. ° ° °IF you received an x-ray today, you will receive an invoice from [facility]. Please contact [facility] at [phone] with questions or concerns regarding your invoice.  ° °IF you received labwork today, you will receive an invoice from [facility]. Please contact [facility] at [phone] with questions or concerns regarding your invoice.  ° °Our billing staff will not be able to assist you with questions regarding bills from these companies. ° °You will be contacted with the lab results as soon as they are available. The fastest way to get your results is to activate your My Chart account. Instructions are located on the last page of this paperwork. If you have not heard from us regarding the results in 2 weeks, please contact this office. °  ° ° ° °

## 2018-03-14 LAB — LIPID PANEL
Chol/HDL Ratio: 2.9 ratio (ref 0.0–4.4)
Cholesterol, Total: 193 mg/dL (ref 100–199)
HDL: 67 mg/dL (ref >39)
LDL Calculated: 106 mg/dL — ABNORMAL HIGH (ref 0–99)
Triglycerides: 102 mg/dL (ref 0–149)
VLDL Cholesterol Cal: 20 mg/dL (ref 5–40)

## 2018-03-14 LAB — CBC
Hematocrit: 42.2 % (ref 34.0–46.6)
Hemoglobin: 13.9 g/dL (ref 11.1–15.9)
MCH: 29.4 pg (ref 26.6–33.0)
MCHC: 32.9 g/dL (ref 31.5–35.7)
MCV: 89 fL (ref 79–97)
Platelets: 295 10*3/uL (ref 150–450)
RBC: 4.72 x10E6/uL (ref 3.77–5.28)
RDW: 13.4 % (ref 12.3–15.4)
WBC: 6.4 10*3/uL (ref 3.4–10.8)

## 2018-03-14 LAB — COMPREHENSIVE METABOLIC PANEL
ALT: 20 IU/L (ref 0–32)
AST: 24 IU/L (ref 0–40)
Albumin/Globulin Ratio: 1.4 (ref 1.2–2.2)
Albumin: 4.2 g/dL (ref 3.6–4.8)
Alkaline Phosphatase: 83 IU/L (ref 39–117)
BUN/Creatinine Ratio: 21 (ref 12–28)
BUN: 13 mg/dL (ref 8–27)
Bilirubin Total: 0.6 mg/dL (ref 0.0–1.2)
CO2: 24 mmol/L (ref 20–29)
Calcium: 10.2 mg/dL (ref 8.7–10.3)
Chloride: 98 mmol/L (ref 96–106)
Creatinine, Ser: 0.63 mg/dL (ref 0.57–1.00)
GFR calc Af Amer: 109 mL/min/{1.73_m2} (ref >59)
GFR calc non Af Amer: 94 mL/min/{1.73_m2} (ref >59)
Globulin, Total: 3 g/dL (ref 1.5–4.5)
Glucose: 90 mg/dL (ref 65–99)
Potassium: 4.3 mmol/L (ref 3.5–5.2)
Sodium: 137 mmol/L (ref 134–144)
Total Protein: 7.2 g/dL (ref 6.0–8.5)

## 2018-03-14 LAB — HEMOGLOBIN A1C
Est. average glucose Bld gHb Est-mCnc: 123 mg/dL
Hgb A1c MFr Bld: 5.9 % — ABNORMAL HIGH (ref 4.8–5.6)

## 2018-03-14 LAB — TSH: TSH: 1.7 u[IU]/mL (ref 0.450–4.500)

## 2018-03-17 ENCOUNTER — Encounter: Payer: Self-pay | Admitting: *Deleted

## 2018-03-18 DIAGNOSIS — M25511 Pain in right shoulder: Secondary | ICD-10-CM | POA: Diagnosis not present

## 2018-03-18 DIAGNOSIS — Z471 Aftercare following joint replacement surgery: Secondary | ICD-10-CM | POA: Diagnosis not present

## 2018-03-18 DIAGNOSIS — Z96611 Presence of right artificial shoulder joint: Secondary | ICD-10-CM | POA: Diagnosis not present

## 2018-03-20 DIAGNOSIS — Z471 Aftercare following joint replacement surgery: Secondary | ICD-10-CM | POA: Diagnosis not present

## 2018-03-20 DIAGNOSIS — Z96611 Presence of right artificial shoulder joint: Secondary | ICD-10-CM | POA: Diagnosis not present

## 2018-03-20 DIAGNOSIS — M25511 Pain in right shoulder: Secondary | ICD-10-CM | POA: Diagnosis not present

## 2018-05-13 ENCOUNTER — Ambulatory Visit: Payer: Medicare HMO | Admitting: Endocrinology

## 2018-05-28 DIAGNOSIS — Z96641 Presence of right artificial hip joint: Secondary | ICD-10-CM | POA: Diagnosis not present

## 2018-05-28 DIAGNOSIS — Z09 Encounter for follow-up examination after completed treatment for conditions other than malignant neoplasm: Secondary | ICD-10-CM | POA: Diagnosis not present

## 2018-05-28 DIAGNOSIS — M7061 Trochanteric bursitis, right hip: Secondary | ICD-10-CM | POA: Diagnosis not present

## 2018-06-16 ENCOUNTER — Encounter: Payer: Self-pay | Admitting: Podiatry

## 2018-06-16 ENCOUNTER — Ambulatory Visit (INDEPENDENT_AMBULATORY_CARE_PROVIDER_SITE_OTHER): Payer: Medicare HMO | Admitting: Podiatry

## 2018-06-16 DIAGNOSIS — M76822 Posterior tibial tendinitis, left leg: Secondary | ICD-10-CM | POA: Diagnosis not present

## 2018-06-23 NOTE — Progress Notes (Signed)
Subjective:   Patient ID: Angela Estrada, female   DOB: 68 y.o.   MRN: [id number]   HPI Patient states overall she is very happy with her brace but she is getting a little bit of bruising on the medial side of the left foot she wanted checked   ROS      Objective:  Physical Exam  Neurovascular status intact muscle strength is adequate patient's left medial ankle showing slight discoloration that is localized with no breakdown of tissue no redness no drainage noted with brace overall doing very well and helping to hold the arch     Assessment:  Severe posterior tibial tendinitis left that is being helped by brace but she does have some movement which is natural creating friction     Plan:  She will see ped orthotist to try to have some kind of brace accommodation but I recommended for the most part that she just utilize a bandage if she is on a lot and hopefully that will protect it and prevent it from breaking down

## 2018-07-16 DIAGNOSIS — Z471 Aftercare following joint replacement surgery: Secondary | ICD-10-CM | POA: Diagnosis not present

## 2018-07-16 DIAGNOSIS — M19011 Primary osteoarthritis, right shoulder: Secondary | ICD-10-CM | POA: Diagnosis not present

## 2018-07-16 DIAGNOSIS — Z96611 Presence of right artificial shoulder joint: Secondary | ICD-10-CM | POA: Diagnosis not present

## 2018-09-05 ENCOUNTER — Other Ambulatory Visit: Payer: Self-pay | Admitting: *Deleted

## 2018-09-05 DIAGNOSIS — R7309 Other abnormal glucose: Secondary | ICD-10-CM

## 2018-09-05 DIAGNOSIS — E7800 Pure hypercholesterolemia, unspecified: Secondary | ICD-10-CM

## 2018-09-05 DIAGNOSIS — I10 Essential (primary) hypertension: Secondary | ICD-10-CM

## 2018-09-12 ENCOUNTER — Telehealth: Payer: Medicare HMO | Admitting: Family Medicine

## 2018-09-24 ENCOUNTER — Other Ambulatory Visit: Payer: Self-pay | Admitting: *Deleted

## 2018-09-24 ENCOUNTER — Other Ambulatory Visit: Payer: Self-pay | Admitting: Family Medicine

## 2018-09-24 MED ORDER — AMLODIPINE BESYLATE 2.5 MG PO TABS: 2.5000 mg | ORAL_TABLET | Freq: Every day | ORAL | 0 refills | Status: DC

## 2018-09-24 NOTE — Telephone Encounter (Signed)
Requested medication (s) are due for refill today: Yes  Requested medication (s) are on the active medication list: Yes  Last refill:  03/13/18  Future visit scheduled: No  Notes to clinic:  Left message for pt. To call and schedule an OV.    Requested Prescriptions  Pending Prescriptions Disp Refills   amLODipine (NORVASC) 2.5 MG tablet [Pharmacy Med Name: AMLODIPINE BESYLATE 2.5 MG TAB] 90 tablet 1    Sig: TAKE 1 TABLET BY MOUTH EVERY DAY     Cardiovascular:  Calcium Channel Blockers Failed - 09/24/2018  9:44 AM      Failed - Valid encounter within last 6 months    Recent Outpatient Visits          6 months ago Essential hypertension   [facility], Lilia Argue, MD   2 years ago Hordeolum externum of left upper eyelid   [facility] at [place], [place], [place]   6 years ago Unspecified essential hypertension   [place], [place]   7 years ago Acute otitis media   [facility] -Areatha Keas, MD   7 years ago Cough   [facility], Hilliard Clark, MD             Passed - Last BP in normal range    BP Readings from Last 1 Encounters:  03/13/18 136/81

## 2018-09-26 ENCOUNTER — Ambulatory Visit: Payer: Medicare HMO | Admitting: Obstetrics and Gynecology

## 2018-10-02 ENCOUNTER — Other Ambulatory Visit: Payer: Self-pay | Admitting: Family Medicine

## 2018-10-17 ENCOUNTER — Other Ambulatory Visit: Payer: Self-pay | Admitting: Family Medicine

## 2018-11-04 ENCOUNTER — Other Ambulatory Visit: Payer: Self-pay | Admitting: Obstetrics and Gynecology

## 2018-11-04 DIAGNOSIS — Z1231 Encounter for screening mammogram for malignant neoplasm of breast: Secondary | ICD-10-CM

## 2018-11-24 ENCOUNTER — Ambulatory Visit (INDEPENDENT_AMBULATORY_CARE_PROVIDER_SITE_OTHER): Payer: Medicare HMO | Admitting: Obstetrics and Gynecology

## 2018-11-24 ENCOUNTER — Encounter: Payer: Self-pay | Admitting: Obstetrics and Gynecology

## 2018-11-24 ENCOUNTER — Other Ambulatory Visit (HOSPITAL_COMMUNITY)
Admission: RE | Admit: 2018-11-24 | Discharge: 2018-11-24 | Disposition: A | Discharge status: Home / Self Care | Payer: Medicare HMO | Source: Ambulatory Visit | Attending: Obstetrics and Gynecology | Admitting: Obstetrics and Gynecology

## 2018-11-24 ENCOUNTER — Other Ambulatory Visit: Payer: Self-pay

## 2018-11-24 VITALS — BP 118/70 | HR 68 | Temp 97.5°F | Resp 12 | Ht 63.0 in | Wt 229.0 lb

## 2018-11-24 DIAGNOSIS — Z01419 Encounter for gynecological examination (general) (routine) without abnormal findings: Secondary | ICD-10-CM

## 2018-11-24 DIAGNOSIS — Z780 Asymptomatic menopausal state: Secondary | ICD-10-CM | POA: Diagnosis not present

## 2018-11-24 NOTE — Patient Instructions (Signed)

## 2018-11-24 NOTE — Progress Notes (Signed)
68 y.o. [id number] Married [place] American female here for annual exam.    No vaginal bleeding or discharge.  Good bladder control but she does have night time urination a couple of times.  Reducing tea helps this and reduces her heart burn as well. Bowel function working well.   Denies abdominal or pelvic pain.   Had right hip replacement and her right shoulder replacement since her last annual exam.  PCP: Grant Fontana, MD    Patient's last menstrual period was 08/30/1998 (approximate).           Sexually active: No.  The current method of family planning is post menopausal status.    Exercising: Yes.    bike, squats, stretches, leg lifts Smoker:  no  Health Maintenance: Pap:  06/08/16 Pap and HR HPV negative History of abnormal Pap:  [id number] had abnormal pap but normal on repeat--no colposcopy. Paps normal since. MMG:  05/21/16 BIRADS 1 negative/density b -- scheduled 12/22/18 Colonoscopy:  03/20/17 Normal repeat 10 years BMD:   05/09/10  Result  Normal at [place]:  2015 HIV: 05/27/16 Negative Hep C: 03/10/13 Negative Screening Labs: PCP   reports that she has never smoked. She has never used smokeless tobacco. She reports that she does not drink alcohol or use drugs.  Past Medical History:  Diagnosis Date  . Abdominal hernia    RLQ  . Allergy   . Arthritis   . Arthritis   . HYPERCHOLESTEROLEMIA 04/25/2007   takes Pravastatin daily  . HYPERTENSION 01/03/2007   takes Losartan-HCTZ  and Amlodipine daily  . Hypokalemia    takes Potassium daily  . Joint pain   . Nocturia   . Pneumonia 2 yrs ago  . Primary osteoarthritis of shoulder    Right    Past Surgical History:  Procedure Laterality Date  . CESAREAN SECTION  25 years ago  . CHOLECYSTECTOMY    . COLONOSCOPY    . DILATION AND CURETTAGE OF UTERUS  35 years ago  . HERNIA REPAIR    . INCISIONAL HERNIA REPAIR  02/21/2012   Procedure: LAPAROSCOPIC INCISIONAL HERNIA;  Surgeon: Harl Bowie, MD;   Location: [place];  Service: General;  Laterality: N/A;  laparoscopic incisional hernia repair with mesh  . JOINT REPLACEMENT     bilateral knee replacements  . REPLACEMENT TOTAL KNEE BILATERAL  2010  . TOTAL HIP ARTHROPLASTY Right 03/19/2016   Procedure: RIGHT TOTAL HIP ARTHROPLASTY ANTERIOR APPROACH;  Surgeon: Frederik Pear, MD;  Location: [place];  Service: Orthopedics;  Laterality: Right;  . TOTAL HIP REVISION Right 10/02/2017   Procedure: TOTAL HIP REVISION POSTERIOR;  Surgeon: Frederik Pear, MD;  Location: [place];  Service: Orthopedics;  Laterality: Right;  . TOTAL SHOULDER ARTHROPLASTY Right 12/19/2017   Procedure: RIGHT TOTAL SHOULDER ARTHROPLASTY;  Surgeon: Tania Ade, MD;  Location: [facility];  Service: Orthopedics;  Laterality: Right;  . TUBAL LIGATION      Current Outpatient Medications  Medication Sig Dispense Refill  . amLODipine (NORVASC) 2.5 MG tablet Take 1 tablet (2.5 mg total) by mouth daily. 90 tablet 0  . cetirizine-pseudoephedrine (ZYRTEC-D) 5-120 MG tablet Take 1 tablet by mouth daily as needed for allergies.    Marland Kitchen KLOR-CON M20 20 MEQ tablet TAKE 1 TABLET BY MOUTH TWICE A DAY 60 tablet 2  . pravastatin (PRAVACHOL) 20 MG tablet Take 1 tablet (20 mg total) by mouth every evening. 90 tablet 3  . telmisartan-hydrochlorothiazide (MICARDIS HCT) 80-25 MG tablet Take 1 tablet by mouth  daily. 90 tablet 1  . triamcinolone cream (KENALOG) 0.1 % Apply 1 application topically 2 (two) times daily. 30 g 0   No current facility-administered medications for this visit.     Family History  Problem Relation Age of Onset  . Diabetes Mother   . Heart disease Mother   . Hypertension Mother   . Stroke Mother   . Diabetes Father   . Heart disease Father        Triple Bypass  . Hypertension Father   . Heart attack Brother   . Congestive Heart Failure Brother   . Hypertension Brother   . Heart disease Brother   . Hypertension Sister   . Hypertension Sister   . Ovarian cancer Sister 73   . Hypertension Sister   . Hypertension Sister   . Hypertension Sister   . Hypertension Sister   . Cancer Neg Hx   . Colon cancer Neg Hx     Review of Systems  Constitutional: Negative.   HENT: Negative.   Eyes: Negative.   Respiratory: Negative.   Cardiovascular: Negative.   Gastrointestinal: Negative.   Endocrine: Negative.   Genitourinary: Negative.   Musculoskeletal: Negative.   Skin: Negative.   Allergic/Immunologic: Negative.   Neurological: Negative.   Hematological: Negative.   Psychiatric/Behavioral: Negative.     Exam:   BP 118/70 (BP Location: Right Arm, Patient Position: Sitting, Cuff Size: Large)   Pulse 68   Temp (!) 97.5 F (36.4 C) (Temporal)   Resp 12   Ht 5\' 3"  (1.6 m)   Wt 229 lb (103.9 kg)   LMP 08/30/1998 (Approximate)   BMI 40.57 kg/m     General appearance: alert, cooperative and appears stated age Head: normocephalic, without obvious abnormality, atraumatic Neck: no adenopathy, supple, symmetrical, trachea midline and thyroid normal to inspection and palpation Lungs: clear to auscultation bilaterally Breasts: normal appearance, no masses or tenderness, No nipple retraction or dimpling, No nipple discharge or bleeding, No axillary adenopathy Heart: regular rate and rhythm Abdomen: soft, non-tender; no masses, no organomegaly Extremities: extremities normal, atraumatic, no cyanosis or edema Skin: skin color, texture, turgor normal. No rashes or lesions Lymph nodes: cervical, supraclavicular, and axillary nodes normal. Neurologic: grossly normal  Pelvic: External genitalia:  no lesions              No abnormal inguinal nodes palpated.              Urethra:  normal appearing urethra with no masses, tenderness or lesions              Bartholins and Skenes: normal                 Vagina: normal appearing vagina with normal color and discharge, no lesions              Cervix: no lesions              Pap taken: Yes.   Bimanual Exam:  Uterus:   normal size, contour, position, consistency, mobility, non-tender              Adnexa: no mass, fullness, tenderness              Rectal exam: Yes.  .  Confirms.              Anus:  normal sphincter tone, no lesions  Chaperone was present for exam.  Assessment:   Well woman visit with normal exam. Menopausal female.  FH ovarian  cancer in sister.  Plan: Mammogram screening discussed. Self breast awareness reviewed. Pap and HR HPV as above. Guidelines for Calcium, Vitamin D, regular exercise program including cardiovascular and weight bearing exercise. [facility].  She will request to be done at time of her mammogram. Labs with PCP.  Follow up annually and prn.  After visit summary provided.

## 2018-11-25 LAB — CYTOLOGY - PAP
Adequacy: ABSENT
Diagnosis: NEGATIVE

## 2018-12-03 IMAGING — CR DG HIP (WITH OR WITHOUT PELVIS) 1V PORT*R*
2 series · 2 of 2 positions shown · non-contrast
Comparison: 03/19/2016

CLINICAL DATA: Postop right hip revision

EXAM:
DG HIP (WITH OR WITHOUT PELVIS) 1V PORT RIGHT

[AP]
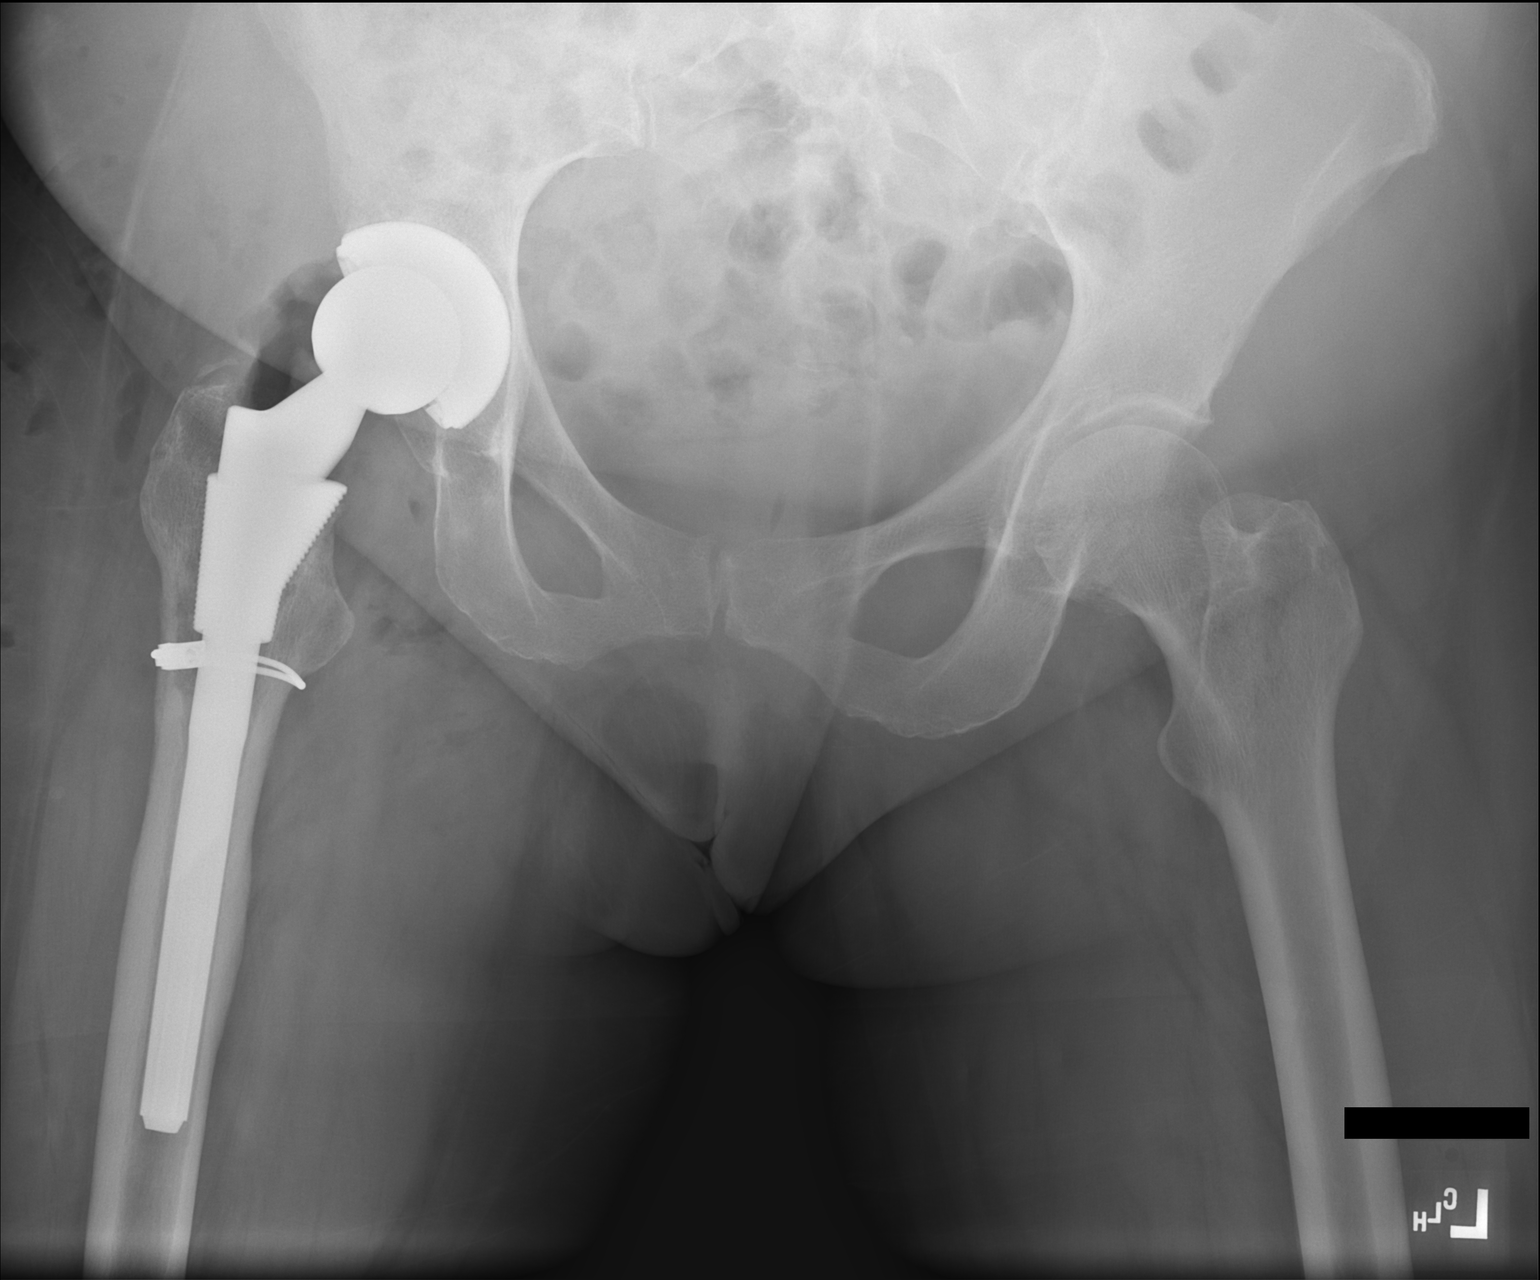

[xtable lateral]
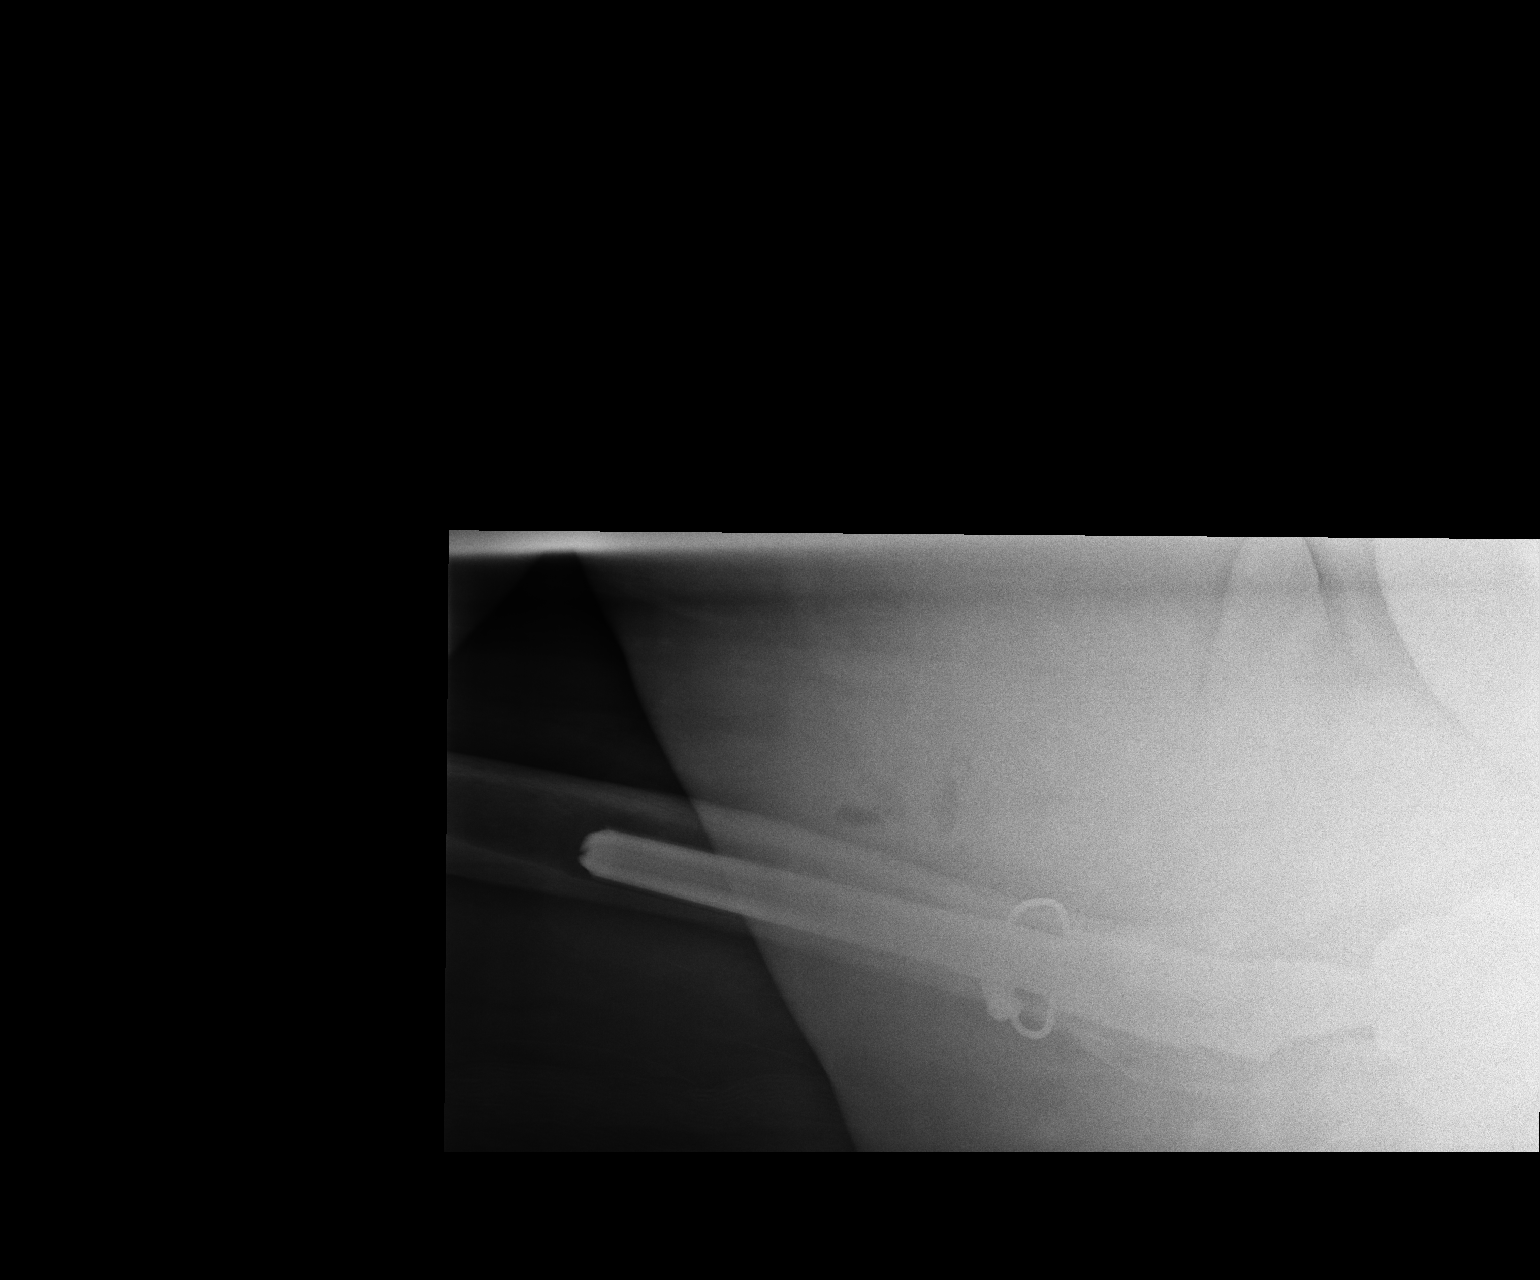

[2 of 2 positions shown; findings below may reference images not displayed]

FINDINGS: Right hip arthroplasty revision performed. Expected postoperative
changes. Components appear aligned. No complicating feature, osseous
or hardware abnormality. Air throughout the soft tissues about the
right hip joint. Normal bowel gas pattern.
IMPRESSION: Expected appearance status post right total hip arthroplasty
revision.

## 2018-12-22 ENCOUNTER — Ambulatory Visit
Admission: RE | Admit: 2018-12-22 | Discharge: 2018-12-22 | Disposition: A | Discharge status: Home / Self Care | Payer: Medicare HMO | Source: Ambulatory Visit | Attending: Obstetrics and Gynecology | Admitting: Obstetrics and Gynecology

## 2018-12-22 ENCOUNTER — Other Ambulatory Visit: Payer: Self-pay

## 2018-12-22 DIAGNOSIS — Z1231 Encounter for screening mammogram for malignant neoplasm of breast: Secondary | ICD-10-CM | POA: Diagnosis not present

## 2019-01-01 ENCOUNTER — Other Ambulatory Visit: Payer: Self-pay | Admitting: Family Medicine

## 2019-01-01 NOTE — Telephone Encounter (Signed)
Courtesy refill  

## 2019-01-10 ENCOUNTER — Other Ambulatory Visit: Payer: Self-pay | Admitting: Family Medicine

## 2019-01-10 NOTE — Telephone Encounter (Signed)
Requested Prescriptions  Pending Prescriptions Disp Refills  . KLOR-CON M20 20 MEQ tablet [Pharmacy Med Name: KLOR-CON M20 TABLET] 180 tablet 0    Sig: TAKE 1 TABLET BY MOUTH TWICE A DAY     Endocrinology:  Minerals - Potassium Supplementation Passed - 01/10/2019  9:30 AM      Passed - K in normal range and within 360 days    Potassium  Date Value Ref Range Status  03/13/2018 4.3 3.5 - 5.2 mmol/L Final         Passed - Cr in normal range and within 360 days    Creatinine, Ser  Date Value Ref Range Status  03/13/2018 0.63 0.57 - 1.00 mg/dL Final         Passed - Valid encounter within last 12 months    Recent Outpatient Visits          10 months ago Essential hypertension   [facility], Lilia Argue, MD   2 years ago Hordeolum externum of left upper eyelid   [facility] at [place], [place], [place]   6 years ago Unspecified essential hypertension   [facility] -Areatha Keas, MD   7 years ago Acute otitis media   [facility] -Areatha Keas, MD   7 years ago Cough   [facility] -Areatha Keas, MD      Future Appointments            In 1 week Rutherford Guys, MD Primary [facility] at [place], [facility]

## 2019-01-14 DIAGNOSIS — M19011 Primary osteoarthritis, right shoulder: Secondary | ICD-10-CM | POA: Diagnosis not present

## 2019-01-19 ENCOUNTER — Encounter: Payer: Self-pay | Admitting: Family Medicine

## 2019-01-19 ENCOUNTER — Ambulatory Visit (INDEPENDENT_AMBULATORY_CARE_PROVIDER_SITE_OTHER): Payer: Medicare HMO | Admitting: Family Medicine

## 2019-01-19 ENCOUNTER — Other Ambulatory Visit: Payer: Self-pay

## 2019-01-19 VITALS — BP 134/80 | HR 70 | Temp 98.3°F | Ht 63.0 in | Wt 227.2 lb

## 2019-01-19 DIAGNOSIS — R7309 Other abnormal glucose: Secondary | ICD-10-CM | POA: Diagnosis not present

## 2019-01-19 DIAGNOSIS — E7800 Pure hypercholesterolemia, unspecified: Secondary | ICD-10-CM | POA: Diagnosis not present

## 2019-01-19 DIAGNOSIS — H6121 Impacted cerumen, right ear: Secondary | ICD-10-CM

## 2019-01-19 DIAGNOSIS — I10 Essential (primary) hypertension: Secondary | ICD-10-CM

## 2019-01-19 MED ORDER — POTASSIUM CHLORIDE CRYS ER 20 MEQ PO TBCR: 20.0000 meq | EXTENDED_RELEASE_TABLET | Freq: Two times a day (BID) | ORAL | 1 refills | Status: DC

## 2019-01-19 NOTE — Patient Instructions (Addendum)
shingrix at your pharmcy of choice    If you have lab work done today you will be contacted with your lab results within the next 2 weeks.  If you have not heard from [place] then please contact us. The fastest way to get your results is to register for My Chart.   IF you received an x-ray today, you will receive an invoice from [facility]. Please contact [facility] at [phone] with questions or concerns regarding your invoice.   IF you received labwork today, you will receive an invoice from [facility]. Please contact [facility] at [phone] with questions or concerns regarding your invoice.   Our billing staff will not be able to assist you with questions regarding bills from these companies.  You will be contacted with the lab results as soon as they are available. The fastest way to get your results is to activate your My Chart account. Instructions are located on the last page of this paperwork. If you have not heard from [place] regarding the results in 2 weeks, please contact this office.

## 2019-01-19 NOTE — Progress Notes (Signed)
8/10/202011:23 AM  Angela Estrada Feb 05, 1951, 68 y.o., female [id number]  Chief Complaint  Patient presents with  . medical condition    f/u     HPI:   Patient is a 68 y.o. female with past medical history significant for hyperglycemia, HTN, OA right hip, HLP, who presents today for routine followup  Last OV Oct 2019 She is overall doing well Trying to eat healthy, exercises 3-4 x week Uses cane, no cane Retired, lives with her husband  Taking her meds as prescribed Denies any side effects Does not check BP nor glucose at home Only needs refill of KCL, has been on it for about a year  Requesting ears to be checked Feels as if she has ear wax in right ear  Had WWE with ob gyn June 2020   Lab Results  Component Value Date   HGBA1C 5.9 (H) 03/13/2018   HGBA1C 5.7 09/10/2017   HGBA1C 5.9 09/10/2017   Lab Results  Component Value Date   MICROALBUR 0.7 04/05/2015   LDLCALC 106 (H) 03/13/2018   CREATININE 0.63 03/13/2018   Lab Results  Component Value Date   CREATININE 0.63 03/13/2018   BUN 13 03/13/2018   NA 137 03/13/2018   K 4.3 03/13/2018   CL 98 03/13/2018   CO2 24 03/13/2018    Wt Readings from Last 3 Encounters:  01/19/19 227 lb 3.2 oz (103.1 kg)  11/24/18 229 lb (103.9 kg)  03/13/18 226 lb (102.5 kg)   BP Readings from Last 3 Encounters:  01/19/19 134/80  11/24/18 118/70  03/13/18 136/81    Depression screen PHQ 2/9 01/19/2019 03/13/2018 06/08/2016  Decreased Interest 0 0 0  Down, Depressed, Hopeless 0 0 0  PHQ - 2 Score 0 0 0    Fall Risk  01/19/2019 03/13/2018 06/08/2016  Falls in the past year? 0 No No  Number falls in past yr: 0 - -  Injury with Fall? 0 - -  Follow up Falls evaluation completed - -     Allergies  Allergen Reactions  . Lisinopril Cough  . Oxycodone Other (See Comments)    Hallucinations    Prior to Admission medications   Medication Sig Start Date End Date Taking? Authorizing Provider  amLODipine (NORVASC)  2.5 MG tablet TAKE 1 TABLET BY MOUTH EVERY DAY 01/01/19  Yes Rutherford Guys, MD  cetirizine-pseudoephedrine (ZYRTEC-D) 5-120 MG tablet Take 1 tablet by mouth daily as needed for allergies.   Yes [provider]  KLOR-CON M20 20 MEQ tablet TAKE 1 TABLET BY MOUTH TWICE A DAY 01/10/19  Yes Rutherford Guys, MD  pravastatin (PRAVACHOL) 20 MG tablet Take 1 tablet (20 mg total) by mouth every evening. 03/13/18  Yes Rutherford Guys, MD  telmisartan-hydrochlorothiazide (MICARDIS HCT) 80-25 MG tablet Take 1 tablet by mouth daily. 03/13/18  Yes Rutherford Guys, MD  triamcinolone cream (KENALOG) 0.1 % Apply 1 application topically 2 (two) times daily. Patient not taking: Reported on 01/19/2019 03/13/18   Rutherford Guys, MD    Past Medical History:  Diagnosis Date  . Abdominal hernia    RLQ  . Allergy   . Arthritis   . Arthritis   . HYPERCHOLESTEROLEMIA 04/25/2007   takes Pravastatin daily  . HYPERTENSION 01/03/2007   takes Losartan-HCTZ  and Amlodipine daily  . Hypokalemia    takes Potassium daily  . Joint pain   . Nocturia   . Pneumonia 2 yrs ago  . Primary osteoarthritis of shoulder  Right    Past Surgical History:  Procedure Laterality Date  . CESAREAN SECTION  25 years ago  . CHOLECYSTECTOMY    . COLONOSCOPY    . DILATION AND CURETTAGE OF UTERUS  35 years ago  . HERNIA REPAIR    . INCISIONAL HERNIA REPAIR  02/21/2012   Procedure: LAPAROSCOPIC INCISIONAL HERNIA;  Surgeon: Harl Bowie, MD;  Location: [place];  Service: General;  Laterality: N/A;  laparoscopic incisional hernia repair with mesh  . JOINT REPLACEMENT     bilateral knee replacements  . REPLACEMENT TOTAL KNEE BILATERAL  2010  . TOTAL HIP ARTHROPLASTY Right 03/19/2016   Procedure: RIGHT TOTAL HIP ARTHROPLASTY ANTERIOR APPROACH;  Surgeon: Frederik Pear, MD;  Location: [place];  Service: Orthopedics;  Laterality: Right;  . TOTAL HIP REVISION Right 10/02/2017   Procedure: TOTAL HIP REVISION POSTERIOR;  Surgeon:  Frederik Pear, MD;  Location: [place];  Service: Orthopedics;  Laterality: Right;  . TOTAL SHOULDER ARTHROPLASTY Right 12/19/2017   Procedure: RIGHT TOTAL SHOULDER ARTHROPLASTY;  Surgeon: Tania Ade, MD;  [facility];  Service: Orthopedics;  Laterality: Right;  . TUBAL LIGATION      Social History   Tobacco Use  . Smoking status: Never Smoker  . Smokeless tobacco: Never Used  Substance Use Topics  . Alcohol use: No    Alcohol/week: 0.0 standard drinks    Family History  Problem Relation Age of Onset  . Diabetes Mother   . Heart disease Mother   . Hypertension Mother   . Stroke Mother   . Diabetes Father   . Heart disease Father        Triple Bypass  . Hypertension Father   . Heart attack Brother   . Congestive Heart Failure Brother   . Hypertension Brother   . Heart disease Brother   . Hypertension Sister   . Hypertension Sister   . Ovarian cancer Sister 41  . Hypertension Sister   . Hypertension Sister   . Hypertension Sister   . Hypertension Sister   . Cancer Neg Hx   . Colon cancer Neg Hx     Review of Systems  Constitutional: Negative for chills and fever.  Respiratory: Negative for cough and shortness of breath.   Cardiovascular: Negative for chest pain, palpitations and leg swelling.  Gastrointestinal: Negative for abdominal pain, nausea and vomiting.     OBJECTIVE:  Today's Vitals   01/19/19 1044  BP: 134/80  Pulse: 70  Temp: 98.3 F (36.8 C)  TempSrc: Oral  SpO2: 97%  Weight: 227 lb 3.2 oz (103.1 kg)  Height: '5\' 3"'$  (1.6 m)   Body mass index is 40.25 kg/m.   Physical Exam Vitals signs and nursing note reviewed.  Constitutional:      Appearance: She is well-developed.  HENT:     Head: Normocephalic and atraumatic.     Right Ear: Ear canal and external ear normal. There is impacted cerumen.     Left Ear: Tympanic membrane, ear canal and external ear normal.     Mouth/Throat:     Pharynx: No oropharyngeal exudate.  Eyes:      General: No scleral icterus.    Conjunctiva/sclera: Conjunctivae normal.     Pupils: Pupils are equal, round, and reactive to light.  Neck:     Musculoskeletal: Neck supple.  Cardiovascular:     Rate and Rhythm: Normal rate and regular rhythm.     Heart sounds: Normal heart sounds. No murmur. No friction rub. No gallop.  Pulmonary:     Effort: Pulmonary effort is normal.     Breath sounds: Normal breath sounds. No wheezing or rales.  Skin:    General: Skin is warm and dry.  Neurological:     Mental Status: She is alert and oriented to person, place, and time.     ASSESSMENT and PLAN  1. Essential hypertension Controlled. Continue current regime.  - Lipid panel - TSH - CMP14+EGFR  2. HYPERGLYCEMIA Checking labs today, medications will be adjusted as needed.  - Lipid panel - Hemoglobin A1c  3. HYPERCHOLESTEROLEMIA Checking labs today, medications will be adjusted as needed.  - Lipid panel - TSH - CMP14+EGFR  4. Impacted cerumen of right ear - Ear wax removal - by CNA without any difficulties  Other orders - potassium chloride SA (KLOR-CON M20) 20 MEQ tablet; Take 1 tablet (20 mEq total) by mouth 2 (two) times daily.  Return in about 6 months (around 07/22/2019) for routine.    Rutherford Guys, MD Primary Care at [facility] [place], [place] [address] Ph.  [phone] Fax [phone]

## 2019-01-20 LAB — CMP14+EGFR
ALT: 18 IU/L (ref 0–32)
AST: 22 IU/L (ref 0–40)
Albumin/Globulin Ratio: 1.5 (ref 1.2–2.2)
Albumin: 4.3 g/dL (ref 3.8–4.8)
Alkaline Phosphatase: 76 IU/L (ref 39–117)
BUN/Creatinine Ratio: 15 (ref 12–28)
BUN: 10 mg/dL (ref 8–27)
Bilirubin Total: 0.5 mg/dL (ref 0.0–1.2)
CO2: 23 mmol/L (ref 20–29)
Calcium: 10 mg/dL (ref 8.7–10.3)
Chloride: 101 mmol/L (ref 96–106)
Creatinine, Ser: 0.67 mg/dL (ref 0.57–1.00)
GFR calc Af Amer: 106 mL/min/{1.73_m2} (ref >59)
GFR calc non Af Amer: 92 mL/min/{1.73_m2} (ref >59)
Globulin, Total: 2.9 g/dL (ref 1.5–4.5)
Glucose: 112 mg/dL — ABNORMAL HIGH (ref 65–99)
Potassium: 4 mmol/L (ref 3.5–5.2)
Sodium: 138 mmol/L (ref 134–144)
Total Protein: 7.2 g/dL (ref 6.0–8.5)

## 2019-01-20 LAB — HEMOGLOBIN A1C
Est. average glucose Bld gHb Est-mCnc: 126 mg/dL
Hgb A1c MFr Bld: 6 % — ABNORMAL HIGH (ref 4.8–5.6)

## 2019-01-20 LAB — LIPID PANEL
Chol/HDL Ratio: 2.8 ratio (ref 0.0–4.4)
Cholesterol, Total: 168 mg/dL (ref 100–199)
HDL: 61 mg/dL (ref >39)
LDL Calculated: 94 mg/dL (ref 0–99)
Triglycerides: 64 mg/dL (ref 0–149)
VLDL Cholesterol Cal: 13 mg/dL (ref 5–40)

## 2019-01-20 LAB — TSH: TSH: 1.82 u[IU]/mL (ref 0.450–4.500)

## 2019-01-27 ENCOUNTER — Other Ambulatory Visit: Payer: Self-pay | Admitting: Family Medicine

## 2019-01-30 ENCOUNTER — Other Ambulatory Visit: Payer: Self-pay | Admitting: Obstetrics and Gynecology

## 2019-01-30 DIAGNOSIS — E2839 Other primary ovarian failure: Secondary | ICD-10-CM

## 2019-02-02 ENCOUNTER — Encounter: Payer: Self-pay | Admitting: Radiology

## 2019-02-02 ENCOUNTER — Other Ambulatory Visit: Payer: Self-pay

## 2019-02-02 ENCOUNTER — Ambulatory Visit
Admission: RE | Admit: 2019-02-02 | Discharge: 2019-02-02 | Disposition: A | Discharge status: Home / Self Care | Payer: Medicare HMO | Source: Ambulatory Visit | Attending: Obstetrics and Gynecology | Admitting: Obstetrics and Gynecology

## 2019-02-02 DIAGNOSIS — E2839 Other primary ovarian failure: Secondary | ICD-10-CM

## 2019-02-02 DIAGNOSIS — Z13820 Encounter for screening for osteoporosis: Secondary | ICD-10-CM | POA: Diagnosis not present

## 2019-02-02 DIAGNOSIS — Z780 Asymptomatic menopausal state: Secondary | ICD-10-CM | POA: Diagnosis not present

## 2019-02-03 ENCOUNTER — Ambulatory Visit (INDEPENDENT_AMBULATORY_CARE_PROVIDER_SITE_OTHER): Payer: Medicare HMO | Admitting: Emergency Medicine

## 2019-02-03 VITALS — BP 134/80 | Ht 63.0 in | Wt 227.0 lb

## 2019-02-03 DIAGNOSIS — Z0000 Encounter for general adult medical examination without abnormal findings: Secondary | ICD-10-CM | POA: Diagnosis not present

## 2019-02-03 NOTE — Patient Instructions (Signed)
Thank you for taking time to come for your Medicare Wellness Visit. I appreciate your ongoing commitment to your health goals. Please review the following plan we discussed and let me know if I can assist you in the future.  Jesicca Dipierro LPN   Preventive Care 65 Years and Older, Female Preventive care refers to lifestyle choices and visits with your health care provider that can promote health and wellness. This includes:  A yearly physical exam. This is also called an annual well check.  Regular dental and eye exams.  Immunizations.  Screening for certain conditions.  Healthy lifestyle choices, such as diet and exercise. What can I expect for my preventive care visit? Physical exam Your health care provider will check:  Height and weight. These may be used to calculate body mass index (BMI), which is a measurement that tells if you are at a healthy weight.  Heart rate and blood pressure.  Your skin for abnormal spots. Counseling Your health care provider may ask you questions about:  Alcohol, tobacco, and drug use.  Emotional well-being.  Home and relationship well-being.  Sexual activity.  Eating habits.  History of falls.  Memory and ability to understand (cognition).  Work and work environment.  Pregnancy and menstrual history. What immunizations do I need?  Influenza (flu) vaccine  This is recommended every year. Tetanus, diphtheria, and pertussis (Tdap) vaccine  You may need a Td booster every 10 years. Varicella (chickenpox) vaccine  You may need this vaccine if you have not already been vaccinated. Zoster (shingles) vaccine  You may need this after age 60. Pneumococcal conjugate (PCV13) vaccine  One dose is recommended after age 65. Pneumococcal polysaccharide (PPSV23) vaccine  One dose is recommended after age 65. Measles, mumps, and rubella (MMR) vaccine  You may need at least one dose of MMR if you were born in 1957 or later. You may also  need a second dose. Meningococcal conjugate (MenACWY) vaccine  You may need this if you have certain conditions. Hepatitis A vaccine  You may need this if you have certain conditions or if you travel or work in places where you may be exposed to hepatitis A. Hepatitis B vaccine  You may need this if you have certain conditions or if you travel or work in places where you may be exposed to hepatitis B. Haemophilus influenzae type b (Hib) vaccine  You may need this if you have certain conditions. You may receive vaccines as individual doses or as more than one vaccine together in one shot (combination vaccines). Talk with your health care provider about the risks and benefits of combination vaccines. What tests do I need? Blood tests  Lipid and cholesterol levels. These may be checked every 5 years, or more frequently depending on your overall health.  Hepatitis C test.  Hepatitis B test. Screening  Lung cancer screening. You may have this screening every year starting at age 55 if you have a 30-pack-year history of smoking and currently smoke or have quit within the past 15 years.  Colorectal cancer screening. All adults should have this screening starting at age 50 and continuing until age 75. Your health care provider may recommend screening at age 45 if you are at increased risk. You will have tests every 1-10 years, depending on your results and the type of screening test.  Diabetes screening. This is done by checking your blood sugar (glucose) after you have not eaten for a while (fasting). You may have this done every   1-3 years.  Mammogram. This may be done every 1-2 years. Talk with your health care provider about how often you should have regular mammograms.  BRCA-related cancer screening. This may be done if you have a family history of breast, ovarian, tubal, or peritoneal cancers. Other tests  Sexually transmitted disease (STD) testing.  Bone density scan. This is done  to screen for osteoporosis. You may have this done starting at age 65. Follow these instructions at home: Eating and drinking  Eat a diet that includes fresh fruits and vegetables, whole grains, lean protein, and low-fat dairy products. Limit your intake of foods with high amounts of sugar, saturated fats, and salt.  Take vitamin and mineral supplements as recommended by your health care provider.  Do not drink alcohol if your health care provider tells you not to drink.  If you drink alcohol: ? Limit how much you have to 0-1 drink a day. ? Be aware of how much alcohol is in your drink. In the [place] one drink equals one 12 oz bottle of beer (355 mL), one 5 oz glass of wine (148 mL), or one 1 oz glass of hard liquor (44 mL). Lifestyle  Take daily care of your teeth and gums.  Stay active. Exercise for at least 30 minutes on 5 or more days each week.  Do not use any products that contain nicotine or tobacco, such as cigarettes, e-cigarettes, and chewing tobacco. If you need help quitting, ask your health care provider.  If you are sexually active, practice safe sex. Use a condom or other form of protection in order to prevent STIs (sexually transmitted infections).  Talk with your health care provider about taking a low-dose aspirin or statin. What's next?  Go to your health care provider once a year for a well check visit.  Ask your health care provider how often you should have your eyes and teeth checked.  Stay up to date on all vaccines. This information is not intended to replace advice given to you by your health care provider. Make sure you discuss any questions you have with your health care provider. Document Released: 06/24/2015 Document Revised: 05/22/2018 Document Reviewed: 05/22/2018 Elsevier Patient Education  2020 [employer]  

## 2019-02-03 NOTE — Progress Notes (Signed)
Presents today for [employer] Visit   Date of last exam: 01/19/2019  Interpreter used for this visit? No  I connected with  Angela Estrada on 02/03/19 by a telephone and verified that I am speaking with the correct person using two identifiers.      Patient Care Team: Rutherford Guys, MD as PCP - General (Family Medicine) Nunzio Cobbs, MD as Attending Physician (Obstetrics and Gynecology) Tania Ade, MD as Consulting Physician (Orthopedic Surgery) Frederik Pear, MD as Consulting Physician (Orthopedic Surgery)   Other items to address today:  Discussed immunizations Discussed Eye/Dental exams   Other Screening: Last screening for diabetes: 01/19/2019 Last lipid screening: 01/19/2019  ADVANCE DIRECTIVES: Discussed: yes On File: no copy requested Materials Provided: no  Immunization status:  Immunization History  Administered Date(s) Administered  . Influenza Split 05/20/2012  . Influenza, High Dose Seasonal PF 03/13/2018  . Influenza,inj,Quad PF,6+ Mos 02/11/2014, 03/01/2015, 03/21/2016, 04/02/2017  . Pneumococcal Conjugate-13 11/01/2017  . Pneumococcal Polysaccharide-23 03/21/2016  . Td 08/04/2001  . Tdap 10/02/2013  . Zoster 03/29/2014     Health Maintenance Due  Topic Date Due  . INFLUENZA VACCINE  01/10/2019     Functional Status Survey: Is the patient deaf or have difficulty hearing?: No Does the patient have difficulty seeing, even when wearing glasses/contacts?: No Does the patient have difficulty concentrating, remembering, or making decisions?: No Does the patient have difficulty walking or climbing stairs?: No Does the patient have difficulty dressing or bathing?: No Does the patient have difficulty doing errands alone such as visiting a doctor's office or shopping?: No   6CIT Screen 02/03/2019  What Year? 0 points  What month? 0 points  What time? 0 points  Count back from 20 0 points  Months in  reverse 0 points  Repeat phrase 0 points  Total Score 0         Home Environment:   Lives two story home No trouble climbing stairs No scattered rugs Yes to grab bars Adequate lighting/no clutter  Hip surgery 2017 had it redone 2019.   Patient Active Problem List   Diagnosis Date Noted  . 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 Arthritis of right hip 03/19/2016  . Primary osteoarthritis of right hip 03/18/2016  . Hypokalemia 03/29/2014  . Arthralgia 02/11/2014  . Routine general medical examination at a health care facility 03/28/2013  . Nonspecific abnormal findings on radiological and examination of lung field 03/27/2013  . Cough 03/11/2013  . Unspecified disorder of liver 03/09/2013  . Pyuria 03/09/2013  . Arthritis   . Ventral hernia 01/18/2012  . Encounter for long-term (current) use of other medications 07/05/2011  . HYPERGLYCEMIA 08/12/2008  . HYPERCHOLESTEROLEMIA 04/25/2007  . DIZZINESS AND GIDDINESS 04/25/2007  . Essential hypertension 01/03/2007  . ALLERGIC RHINITIS 01/03/2007     Past Medical History:  Diagnosis Date  . Abdominal hernia    RLQ  . Allergy   . Arthritis   . Arthritis   . HYPERCHOLESTEROLEMIA 04/25/2007   takes Pravastatin daily  . HYPERTENSION 01/03/2007   takes Losartan-HCTZ  and Amlodipine daily  . Hypokalemia    takes Potassium daily  . Joint pain   . Nocturia   . Pneumonia 2 yrs ago  . Primary osteoarthritis of shoulder    Right     Past Surgical History:  Procedure Laterality Date  . CESAREAN SECTION  25 years ago  .  CHOLECYSTECTOMY    . COLONOSCOPY    . DILATION AND CURETTAGE OF UTERUS  35 years ago  . HERNIA REPAIR    . INCISIONAL HERNIA REPAIR  02/21/2012   Procedure: LAPAROSCOPIC INCISIONAL HERNIA;  Surgeon: Harl Bowie, MD;  Location: [place];  Service: General;  Laterality: N/A;  laparoscopic incisional hernia repair with mesh  . JOINT  REPLACEMENT     bilateral knee replacements  . REPLACEMENT TOTAL KNEE BILATERAL  2010  . TOTAL HIP ARTHROPLASTY Right 03/19/2016   Procedure: RIGHT TOTAL HIP ARTHROPLASTY ANTERIOR APPROACH;  Surgeon: Frederik Pear, MD;  Location: [place];  Service: Orthopedics;  Laterality: Right;  . TOTAL HIP REVISION Right 10/02/2017   Procedure: TOTAL HIP REVISION POSTERIOR;  Surgeon: Frederik Pear, MD;  Location: [place];  Service: Orthopedics;  Laterality: Right;  . TOTAL SHOULDER ARTHROPLASTY Right 12/19/2017   Procedure: RIGHT TOTAL SHOULDER ARTHROPLASTY;  Surgeon: Tania Ade, MD;  Location: [facility];  Service: Orthopedics;  Laterality: Right;  . TUBAL LIGATION       Family History  Problem Relation Age of Onset  . Diabetes Mother   . Heart disease Mother   . Hypertension Mother   . Stroke Mother   . Diabetes Father   . Heart disease Father        Triple Bypass  . Hypertension Father   . Heart attack Brother   . Congestive Heart Failure Brother   . Hypertension Brother   . Heart disease Brother   . Hypertension Sister   . Hypertension Sister   . Ovarian cancer Sister 5  . Hypertension Sister   . Hypertension Sister   . Hypertension Sister   . Hypertension Sister   . Cancer Neg Hx   . Colon cancer Neg Hx      Social History   Socioeconomic History  . Marital status: Married    Spouse name: Not on file  . Number of children: 1  . Years of education: 12th grade +  . Highest education level: Not on file  Occupational History  . Occupation: Insurance claims handler  Social Needs  . Financial resource strain: Not on file  . Food insecurity    Worry: Not on file    Inability: Not on file  . Transportation needs    Medical: Not on file    Non-medical: Not on file  Tobacco Use  . Smoking status: Never Smoker  . Smokeless tobacco: Never Used  Substance and Sexual Activity  . Alcohol use: No    Alcohol/week: 0.0 standard drinks  . Drug use: No  . Sexual activity: Not Currently    Partners:  Male    Birth control/protection: Post-menopausal  Lifestyle  . Physical activity    Days per week: Not on file    Minutes per session: Not on file  . Stress: Not on file  Relationships  . Social Herbalist on phone: Not on file    Gets together: Not on file    Attends religious service: Not on file    Active member of club or organization: Not on file    Attends meetings of clubs or organizations: Not on file    Relationship status: Not on file  . Intimate partner violence    Fear of current or ex partner: Not on file    Emotionally abused: Not on file    Physically abused: Not on file    Forced sexual activity: Not on file  Other Topics Concern  .  Not on file  Social History Narrative   Lives with her husband.   Adult daughter lives nearby.     Allergies  Allergen Reactions  . Lisinopril Cough  . Oxycodone Other (See Comments)    Hallucinations     Prior to Admission medications   Medication Sig Start Date End Date Taking? Authorizing Provider  amLODipine (NORVASC) 2.5 MG tablet TAKE 1 TABLET BY MOUTH EVERY DAY 01/27/19  Yes Rutherford Guys, MD  cetirizine-pseudoephedrine (ZYRTEC-D) 5-120 MG tablet Take 1 tablet by mouth daily as needed for allergies.   Yes [provider]  potassium chloride SA (KLOR-CON M20) 20 MEQ tablet Take 1 tablet (20 mEq total) by mouth 2 (two) times daily. 01/19/19  Yes Rutherford Guys, MD  pravastatin (PRAVACHOL) 20 MG tablet Take 1 tablet (20 mg total) by mouth every evening. 03/13/18  Yes Rutherford Guys, MD  telmisartan-hydrochlorothiazide (MICARDIS HCT) 80-25 MG tablet Take 1 tablet by mouth daily. 03/13/18  Yes Rutherford Guys, MD  triamcinolone cream (KENALOG) 0.1 % Apply 1 application topically 2 (two) times daily. Patient not taking: Reported on 01/19/2019 03/13/18   Rutherford Guys, MD     Depression screen [facility] 2/9 02/03/2019 01/19/2019 03/13/2018 06/08/2016  Decreased Interest 0 0 0 0  Down, Depressed, Hopeless 0  0 0 0  PHQ - 2 Score 0 0 0 0     Fall Risk  02/03/2019 01/19/2019 03/13/2018 06/08/2016  Falls in the past year? 0 0 No No  Number falls in past yr: 0 0 - -  Injury with Fall? 0 0 - -  Follow up Falls evaluation completed;Education provided;Falls prevention discussed Falls evaluation completed - -      PHYSICAL EXAM: BP 134/80 Comment: taken from previous visit  Wt 227 lb (103 kg) Comment: taken from previous visit  LMP 08/30/1998 (Approximate)   BMI 40.21 kg/m    Wt Readings from Last 3 Encounters:  02/03/19 227 lb (103 kg)  01/19/19 227 lb 3.2 oz (103.1 kg)  11/24/18 229 lb (103.9 kg)     Medicare annual wellness visit, subsequent    Physical Exam   Education/Counseling provided regarding diet and exercise, prevention of chronic diseases, smoking/tobacco cessation, if applicable, and reviewed "Covered Medicare Preventive Services."

## 2019-02-10 ENCOUNTER — Other Ambulatory Visit: Payer: Self-pay

## 2019-02-10 ENCOUNTER — Ambulatory Visit (INDEPENDENT_AMBULATORY_CARE_PROVIDER_SITE_OTHER): Payer: Medicare HMO | Admitting: Family Medicine

## 2019-02-10 DIAGNOSIS — M25551 Pain in right hip: Secondary | ICD-10-CM | POA: Diagnosis not present

## 2019-02-10 DIAGNOSIS — Z23 Encounter for immunization: Secondary | ICD-10-CM

## 2019-02-10 NOTE — Progress Notes (Signed)
Nurse visit

## 2019-02-26 ENCOUNTER — Other Ambulatory Visit: Payer: Self-pay | Admitting: Family Medicine

## 2019-03-05 ENCOUNTER — Other Ambulatory Visit: Payer: Self-pay | Admitting: Family Medicine

## 2019-03-12 DIAGNOSIS — H5203 Hypermetropia, bilateral: Secondary | ICD-10-CM | POA: Diagnosis not present

## 2019-03-12 DIAGNOSIS — H524 Presbyopia: Secondary | ICD-10-CM | POA: Diagnosis not present

## 2019-03-17 ENCOUNTER — Other Ambulatory Visit: Payer: Self-pay

## 2019-03-17 DIAGNOSIS — Z20822 Contact with and (suspected) exposure to covid-19: Secondary | ICD-10-CM

## 2019-03-17 DIAGNOSIS — Z20828 Contact with and (suspected) exposure to other viral communicable diseases: Secondary | ICD-10-CM | POA: Diagnosis not present

## 2019-03-19 ENCOUNTER — Telehealth: Payer: Self-pay | Admitting: General Practice

## 2019-03-19 LAB — NOVEL CORONAVIRUS, NAA: SARS-CoV-2, NAA: NOT DETECTED

## 2019-03-19 NOTE — Telephone Encounter (Signed)
Negative COVID results given. Patient results "NOT Detected." Caller expressed understanding. ° °

## 2019-03-20 ENCOUNTER — Other Ambulatory Visit: Payer: Self-pay | Admitting: Family Medicine

## 2019-04-01 ENCOUNTER — Other Ambulatory Visit: Payer: Self-pay | Admitting: Family Medicine

## 2019-04-22 ENCOUNTER — Other Ambulatory Visit: Payer: Self-pay | Admitting: Family Medicine

## 2019-05-14 ENCOUNTER — Other Ambulatory Visit: Payer: Self-pay | Admitting: Family Medicine

## 2019-05-28 ENCOUNTER — Other Ambulatory Visit: Payer: Self-pay | Admitting: Family Medicine

## 2019-05-28 NOTE — Telephone Encounter (Signed)
Forwarding medication refill request to the clinical pool for review. 

## 2019-05-29 ENCOUNTER — Other Ambulatory Visit: Payer: Self-pay

## 2019-06-01 ENCOUNTER — Other Ambulatory Visit: Payer: Self-pay | Admitting: Obstetrics and Gynecology

## 2019-07-01 ENCOUNTER — Other Ambulatory Visit: Payer: Self-pay

## 2019-07-01 ENCOUNTER — Ambulatory Visit: Payer: Medicare HMO | Attending: Internal Medicine

## 2019-07-01 DIAGNOSIS — Z20822 Contact with and (suspected) exposure to covid-19: Secondary | ICD-10-CM | POA: Diagnosis not present

## 2019-07-02 LAB — NOVEL CORONAVIRUS, NAA: SARS-CoV-2, NAA: NOT DETECTED

## 2019-07-03 ENCOUNTER — Telehealth: Payer: Self-pay | Admitting: Family Medicine

## 2019-07-03 NOTE — Telephone Encounter (Signed)
Patient called in and received her negative covid test result  

## 2019-07-21 ENCOUNTER — Encounter: Payer: Self-pay | Admitting: Family Medicine

## 2019-07-21 ENCOUNTER — Ambulatory Visit (INDEPENDENT_AMBULATORY_CARE_PROVIDER_SITE_OTHER): Payer: Medicare HMO | Admitting: Family Medicine

## 2019-07-21 ENCOUNTER — Other Ambulatory Visit: Payer: Self-pay

## 2019-07-21 VITALS — BP 132/76 | HR 77 | Temp 97.6°F | Ht 63.0 in | Wt 231.0 lb

## 2019-07-21 DIAGNOSIS — R7309 Other abnormal glucose: Secondary | ICD-10-CM | POA: Diagnosis not present

## 2019-07-21 DIAGNOSIS — E7800 Pure hypercholesterolemia, unspecified: Secondary | ICD-10-CM

## 2019-07-21 DIAGNOSIS — J302 Other seasonal allergic rhinitis: Secondary | ICD-10-CM

## 2019-07-21 DIAGNOSIS — R7303 Prediabetes: Secondary | ICD-10-CM | POA: Diagnosis not present

## 2019-07-21 DIAGNOSIS — I10 Essential (primary) hypertension: Secondary | ICD-10-CM | POA: Diagnosis not present

## 2019-07-21 MED ORDER — PRAVASTATIN SODIUM 20 MG PO TABS: 20.0000 mg | ORAL_TABLET | Freq: Every evening | ORAL | 3 refills | Status: DC

## 2019-07-21 MED ORDER — POTASSIUM CHLORIDE CRYS ER 20 MEQ PO TBCR: 20.0000 meq | EXTENDED_RELEASE_TABLET | Freq: Two times a day (BID) | ORAL | 1 refills | Status: DC

## 2019-07-21 MED ORDER — OLOPATADINE HCL 0.2 % OP SOLN: 1.0000 [drp] | Freq: Every day | OPHTHALMIC | 5 refills | Status: DC

## 2019-07-21 NOTE — Progress Notes (Signed)
2/9/202111:00 AM  [name] Sep 15, 1950, 69 y.o., female [id number]  Chief Complaint  Patient presents with  . Hypertension  . Sinus Problem    suggesting another sinus med and eye drop for itchy eye    HPI:   Patient is a 69 y.o. female with past medical history significant for prediabetes, HTN, hypokalemia, OA right hip, HLP, who presents today for routine followup  Last OV aug 2020 - no changes  She reports that she has been doing well other than having sinus issues Usually uses zyrtec, having really itchy eyes  No fever, sinus pressure, intermittent chills Takes all meds as rx  - denies any side effects Dose not check cbg or BP at home Uses a cane   Lab Results  Component Value Date   HGBA1C 6.0 (H) 01/19/2019   Lab Results  Component Value Date   CHOL 168 01/19/2019   HDL 61 01/19/2019   LDLCALC 94 01/19/2019   TRIG 64 01/19/2019   CHOLHDL 2.8 01/19/2019   Lab Results  Component Value Date   CREATININE 0.67 01/19/2019   BUN 10 01/19/2019   NA 138 01/19/2019   K 4.0 01/19/2019   CL 101 01/19/2019   CO2 23 01/19/2019    Depression screen PHQ 2/9 07/21/2019 02/03/2019 01/19/2019  Decreased Interest 0 0 0  Down, Depressed, Hopeless 0 0 0  PHQ - 2 Score 0 0 0    Fall Risk  07/21/2019 02/03/2019 01/19/2019 03/13/2018 06/08/2016  Falls in the past year? 0 0 0 No No  Number falls in past yr: 0 0 0 - -  Injury with Fall? 0 0 0 - -  Follow up - Falls evaluation completed;Education provided;Falls prevention discussed Falls evaluation completed - -     Allergies  Allergen Reactions  . Lisinopril Cough  . Oxycodone Other (See Comments)    Hallucinations    Prior to Admission medications   Medication Sig Start Date End Date Taking? Authorizing Provider  amLODipine (NORVASC) 2.5 MG tablet TAKE 1 TABLET BY MOUTH EVERY DAY 05/29/19  Yes Rutherford Guys, MD  cetirizine-pseudoephedrine (ZYRTEC-D) 5-120 MG tablet Take 1 tablet by mouth daily as needed for  allergies.   Yes [provider]  potassium chloride SA (KLOR-CON M20) 20 MEQ tablet Take 1 tablet (20 mEq total) by mouth 2 (two) times daily. 01/19/19  Yes Rutherford Guys, MD  pravastatin (PRAVACHOL) 20 MG tablet TAKE 1 TABLET BY MOUTH EVERY DAY IN THE EVENING 03/05/19  Yes Rutherford Guys, MD  [facility] injection  01/21/19  Yes [provider]  telmisartan-hydrochlorothiazide (MICARDIS HCT) 80-25 MG tablet TAKE 1 TABLET BY MOUTH EVERY DAY 04/01/19  Yes Rutherford Guys, MD  triamcinolone cream (KENALOG) 0.1 % Apply 1 application topically 2 (two) times daily. 03/13/18  Yes Rutherford Guys, MD    Past Medical History:  Diagnosis Date  . Abdominal hernia    RLQ  . Allergy   . Arthritis   . Arthritis   . HYPERCHOLESTEROLEMIA 04/25/2007   takes Pravastatin daily  . HYPERTENSION 01/03/2007   takes Losartan-HCTZ  and Amlodipine daily  . Hypokalemia    takes Potassium daily  . Joint pain   . Nocturia   . Pneumonia 2 yrs ago  . Primary osteoarthritis of shoulder    Right    Past Surgical History:  Procedure Laterality Date  . CESAREAN SECTION  25 years ago  . CHOLECYSTECTOMY    . COLONOSCOPY    .  DILATION AND CURETTAGE OF UTERUS  35 years ago  . HERNIA REPAIR    . INCISIONAL HERNIA REPAIR  02/21/2012   Procedure: LAPAROSCOPIC INCISIONAL HERNIA;  Surgeon: Harl Bowie, MD;  Location: [place];  Service: General;  Laterality: N/A;  laparoscopic incisional hernia repair with mesh  . JOINT REPLACEMENT     bilateral knee replacements  . REPLACEMENT TOTAL KNEE BILATERAL  2010  . TOTAL HIP ARTHROPLASTY Right 03/19/2016   Procedure: RIGHT TOTAL HIP ARTHROPLASTY ANTERIOR APPROACH;  Surgeon: Frederik Pear, MD;  Location: [place];  Service: Orthopedics;  Laterality: Right;  . TOTAL HIP REVISION Right 10/02/2017   Procedure: TOTAL HIP REVISION POSTERIOR;  Surgeon: Frederik Pear, MD;  Location: [place];  Service: Orthopedics;  Laterality: Right;  . TOTAL SHOULDER ARTHROPLASTY Right  12/19/2017   Procedure: RIGHT TOTAL SHOULDER ARTHROPLASTY;  Surgeon: Tania Ade, MD;  Location: [facility];  Service: Orthopedics;  Laterality: Right;  . TUBAL LIGATION      Social History   Tobacco Use  . Smoking status: Never Smoker  . Smokeless tobacco: Never Used  Substance Use Topics  . Alcohol use: No    Alcohol/week: 0.0 standard drinks    Family History  Problem Relation Age of Onset  . Diabetes Mother   . Heart disease Mother   . Hypertension Mother   . Stroke Mother   . Diabetes Father   . Heart disease Father        Triple Bypass  . Hypertension Father   . Heart attack Brother   . Congestive Heart Failure Brother   . Hypertension Brother   . Heart disease Brother   . Hypertension Sister   . Hypertension Sister   . Ovarian cancer Sister 76  . Hypertension Sister   . Hypertension Sister   . Hypertension Sister   . Hypertension Sister   . Cancer Neg Hx   . Colon cancer Neg Hx     Review of Systems  Constitutional: Negative for chills and fever.  Respiratory: Negative for cough and shortness of breath.   Cardiovascular: Negative for chest pain, palpitations and leg swelling.  Gastrointestinal: Positive for abdominal pain (LUQ intermittent, no bulges, no a/w other sx). Negative for blood in stool, constipation, diarrhea, melena, nausea and vomiting.     OBJECTIVE:  Today's Vitals   07/21/19 1050  BP: 132/76  Pulse: 77  Temp: 97.6 F (36.4 C)  SpO2: 98%  Weight: 231 lb (104.8 kg)  Height: 5\' 3"  (1.6 m)   Body mass index is 40.92 kg/m.   Physical Exam Vitals and nursing note reviewed.  Constitutional:      Appearance: She is well-developed.  HENT:     Head: Normocephalic and atraumatic.     Mouth/Throat:     Pharynx: No oropharyngeal exudate.  Eyes:     General: No scleral icterus.    Conjunctiva/sclera: Conjunctivae normal.     Pupils: Pupils are equal, round, and reactive to light.  Cardiovascular:     Rate and Rhythm: Normal rate  and regular rhythm.     Heart sounds: Normal heart sounds. No murmur. No friction rub. No gallop.   Pulmonary:     Effort: Pulmonary effort is normal.     Breath sounds: Normal breath sounds. No wheezing, rhonchi or rales.  Abdominal:     General: Bowel sounds are normal. There is no distension.     Palpations: Abdomen is soft. There is no mass.     Tenderness: There is  no abdominal tenderness.     Hernia: No hernia is present.  Musculoskeletal:     Cervical back: Neck supple.  Skin:    General: Skin is warm and dry.  Neurological:     Mental Status: She is alert and oriented to person, place, and time.     No results found for this or any previous visit (from the past 24 hour(s)).  No results found.   ASSESSMENT and PLAN  1. Essential hypertension Controlled. Continue current regime.  - Comprehensive metabolic panel  2. Prediabetes Checking labs today, medications will be started as needed.  - Hemoglobin A1c  3. Pure hypercholesterolemia Checking labs today, medications will be adjusted as needed.  - Lipid panel  4. Seasonal allergies Adding pataday ggt  Other orders - Olopatadine HCl 0.2 % SOLN; Apply 1 drop to eye daily. - potassium chloride SA (KLOR-CON M20) 20 MEQ tablet; Take 1 tablet (20 mEq total) by mouth 2 (two) times daily. - pravastatin (PRAVACHOL) 20 MG tablet; Take 1 tablet (20 mg total) by mouth every evening.  Return in about 6 months (around 01/18/2020).    Rutherford Guys, MD [facility] [place], [place] [address] Ph.  [phone] Fax [phone]

## 2019-07-22 ENCOUNTER — Ambulatory Visit: Payer: Medicare HMO | Admitting: Family Medicine

## 2019-07-22 LAB — HEMOGLOBIN A1C
Est. average glucose Bld gHb Est-mCnc: 123 mg/dL
Hgb A1c MFr Bld: 5.9 % — ABNORMAL HIGH (ref 4.8–5.6)

## 2019-07-22 LAB — COMPREHENSIVE METABOLIC PANEL
ALT: 19 IU/L (ref 0–32)
AST: 24 IU/L (ref 0–40)
Albumin/Globulin Ratio: 1.5 (ref 1.2–2.2)
Albumin: 4.2 g/dL (ref 3.8–4.8)
Alkaline Phosphatase: 77 IU/L (ref 39–117)
BUN/Creatinine Ratio: 12 (ref 12–28)
BUN: 9 mg/dL (ref 8–27)
Bilirubin Total: 0.6 mg/dL (ref 0.0–1.2)
CO2: 23 mmol/L (ref 20–29)
Calcium: 9.9 mg/dL (ref 8.7–10.3)
Chloride: 102 mmol/L (ref 96–106)
Creatinine, Ser: 0.77 mg/dL (ref 0.57–1.00)
GFR calc Af Amer: 93 mL/min/{1.73_m2} (ref >59)
GFR calc non Af Amer: 81 mL/min/{1.73_m2} (ref >59)
Globulin, Total: 2.8 g/dL (ref 1.5–4.5)
Glucose: 91 mg/dL (ref 65–99)
Potassium: 4.1 mmol/L (ref 3.5–5.2)
Sodium: 141 mmol/L (ref 134–144)
Total Protein: 7 g/dL (ref 6.0–8.5)

## 2019-07-22 LAB — LIPID PANEL
Chol/HDL Ratio: 3 ratio (ref 0.0–4.4)
Cholesterol, Total: 183 mg/dL (ref 100–199)
HDL: 62 mg/dL (ref >39)
LDL Chol Calc (NIH): 102 mg/dL — ABNORMAL HIGH (ref 0–99)
Triglycerides: 106 mg/dL (ref 0–149)
VLDL Cholesterol Cal: 19 mg/dL (ref 5–40)

## 2019-07-25 ENCOUNTER — Ambulatory Visit: Payer: Medicare HMO

## 2019-09-11 DIAGNOSIS — H269 Unspecified cataract: Secondary | ICD-10-CM | POA: Diagnosis not present

## 2019-09-11 DIAGNOSIS — Z7982 Long term (current) use of aspirin: Secondary | ICD-10-CM | POA: Diagnosis not present

## 2019-09-11 DIAGNOSIS — Z809 Family history of malignant neoplasm, unspecified: Secondary | ICD-10-CM | POA: Diagnosis not present

## 2019-09-11 DIAGNOSIS — E785 Hyperlipidemia, unspecified: Secondary | ICD-10-CM | POA: Diagnosis not present

## 2019-09-11 DIAGNOSIS — J309 Allergic rhinitis, unspecified: Secondary | ICD-10-CM | POA: Diagnosis not present

## 2019-09-11 DIAGNOSIS — M1990 Unspecified osteoarthritis, unspecified site: Secondary | ICD-10-CM | POA: Diagnosis not present

## 2019-09-11 DIAGNOSIS — R32 Unspecified urinary incontinence: Secondary | ICD-10-CM | POA: Diagnosis not present

## 2019-09-11 DIAGNOSIS — Z008 Encounter for other general examination: Secondary | ICD-10-CM | POA: Diagnosis not present

## 2019-09-11 DIAGNOSIS — Z823 Family history of stroke: Secondary | ICD-10-CM | POA: Diagnosis not present

## 2019-09-11 DIAGNOSIS — I10 Essential (primary) hypertension: Secondary | ICD-10-CM | POA: Diagnosis not present

## 2019-10-02 ENCOUNTER — Other Ambulatory Visit: Payer: Self-pay | Admitting: Family Medicine

## 2019-10-15 ENCOUNTER — Ambulatory Visit (INDEPENDENT_AMBULATORY_CARE_PROVIDER_SITE_OTHER): Payer: Medicare HMO

## 2019-10-15 ENCOUNTER — Other Ambulatory Visit: Payer: Self-pay | Admitting: Podiatry

## 2019-10-15 ENCOUNTER — Encounter: Payer: Self-pay | Admitting: Podiatry

## 2019-10-15 ENCOUNTER — Ambulatory Visit: Payer: Medicare HMO | Admitting: Podiatry

## 2019-10-15 ENCOUNTER — Other Ambulatory Visit: Payer: Self-pay

## 2019-10-15 VITALS — Temp 97.7°F

## 2019-10-15 DIAGNOSIS — M778 Other enthesopathies, not elsewhere classified: Secondary | ICD-10-CM

## 2019-10-15 DIAGNOSIS — M779 Enthesopathy, unspecified: Secondary | ICD-10-CM

## 2019-10-15 DIAGNOSIS — M25572 Pain in left ankle and joints of left foot: Secondary | ICD-10-CM

## 2019-10-18 NOTE — Progress Notes (Signed)
Subjective:   Patient ID: Angela Estrada, female   DOB: 69 y.o.   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

## 2019-11-02 ENCOUNTER — Encounter: Payer: Self-pay | Admitting: Family Medicine

## 2019-11-02 ENCOUNTER — Ambulatory Visit (INDEPENDENT_AMBULATORY_CARE_PROVIDER_SITE_OTHER): Payer: Medicare HMO | Admitting: Family Medicine

## 2019-11-02 ENCOUNTER — Other Ambulatory Visit: Payer: Self-pay

## 2019-11-02 VITALS — BP 141/83 | HR 69 | Temp 98.1°F | Ht 63.0 in | Wt 228.0 lb

## 2019-11-02 DIAGNOSIS — R32 Unspecified urinary incontinence: Secondary | ICD-10-CM | POA: Diagnosis not present

## 2019-11-02 DIAGNOSIS — N3941 Urge incontinence: Secondary | ICD-10-CM | POA: Diagnosis not present

## 2019-11-02 LAB — POCT URINALYSIS DIP (MANUAL ENTRY)
Bilirubin, UA: NEGATIVE
Blood, UA: NEGATIVE
Glucose, UA: NEGATIVE mg/dL
Ketones, POC UA: NEGATIVE mg/dL
Leukocytes, UA: NEGATIVE
Nitrite, UA: NEGATIVE
Protein Ur, POC: NEGATIVE mg/dL
Spec Grav, UA: 1.025 (ref 1.010–1.025)
Urobilinogen, UA: 0.2 E.U./dL
pH, UA: 5.5 (ref 5.0–8.0)

## 2019-11-02 MED ORDER — PHENAZOPYRIDINE HCL 100 MG PO TABS: 100.0000 mg | ORAL_TABLET | Freq: Three times a day (TID) | ORAL | 0 refills | Status: DC | PRN

## 2019-11-02 NOTE — Patient Instructions (Signed)
° ° ° °  If you have lab work done today you will be contacted with your lab results within the next 2 weeks.  If you have not heard from us then please contact us. The fastest way to get your results is to register for My Chart. ° ° °IF you received an x-ray today, you will receive an invoice from [facility]. Please contact [facility] at [phone] with questions or concerns regarding your invoice.  ° °IF you received labwork today, you will receive an invoice from [facility]. Please contact [facility] at [phone] with questions or concerns regarding your invoice.  ° °Our billing staff will not be able to assist you with questions regarding bills from these companies. ° °You will be contacted with the lab results as soon as they are available. The fastest way to get your results is to activate your My Chart account. Instructions are located on the last page of this paperwork. If you have not heard from us regarding the results in 2 weeks, please contact this office. °  ° ° ° °

## 2019-11-02 NOTE — Progress Notes (Signed)
5/24/202111:54 AM  ADELICIA SHUFFORD February 28, 1951, 69 y.o., female [id number]  Chief Complaint  Patient presents with  . Urinary Frequency    and urgency- worsened     HPI:   Patient is a 69 y.o. female with past medical history significant for HTN, HLP, prediabetes, OA right hip s/p THA, hypokalemia who presents today for concerns of urinary incontinence  Patent has been having issues with worsening urinary urgency and occasional incontinence for past several weeks with burning for past week, foul smelling urine, no bleeding, no vaginal discharge, no nausea or vomiting, no fever or chills Uses a cane  Lab Results  Component Value Date   CREATININE 0.77 07/21/2019   BUN 9 07/21/2019   NA 141 07/21/2019   K 4.1 07/21/2019   CL 102 07/21/2019   CO2 23 07/21/2019     Depression screen PHQ 2/9 11/02/2019 07/21/2019 02/03/2019  Decreased Interest 0 0 0  Down, Depressed, Hopeless 0 0 0  PHQ - 2 Score 0 0 0    Fall Risk  11/02/2019 07/21/2019 02/03/2019 01/19/2019 03/13/2018  Falls in the past year? 0 0 0 0 No  Number falls in past yr: 0 0 0 0 -  Injury with Fall? 0 0 0 0 -  Follow up Falls evaluation completed - Falls evaluation completed;Education provided;Falls prevention discussed Falls evaluation completed -     Allergies  Allergen Reactions  . Lisinopril Cough  . Oxycodone Other (See Comments)    Hallucinations    Prior to Admission medications   Medication Sig Start Date End Date Taking? Authorizing Provider  amLODipine (NORVASC) 2.5 MG tablet TAKE 1 TABLET BY MOUTH EVERY DAY 05/29/19  Yes Rutherford Guys, MD  cetirizine-pseudoephedrine (ZYRTEC-D) 5-120 MG tablet Take 1 tablet by mouth daily as needed for allergies.   Yes [provider]  potassium chloride SA (KLOR-CON M20) 20 MEQ tablet Take 1 tablet (20 mEq total) by mouth 2 (two) times daily. 07/21/19  Yes Rutherford Guys, MD  pravastatin (PRAVACHOL) 20 MG tablet Take 1 tablet (20 mg total) by mouth every  evening. 07/21/19  Yes Rutherford Guys, MD  telmisartan-hydrochlorothiazide (MICARDIS HCT) 80-25 MG tablet TAKE 1 TABLET BY MOUTH EVERY DAY 10/02/19  Yes Rutherford Guys, MD  triamcinolone cream (KENALOG) 0.1 % Apply 1 application topically 2 (two) times daily. 03/13/18  Yes Rutherford Guys, MD  Olopatadine HCl 0.2 % SOLN Apply 1 drop to eye daily. Patient not taking: Reported on 11/02/2019 07/21/19   Rutherford Guys, MD    Past Medical History:  Diagnosis Date  . Abdominal hernia    RLQ  . Allergy   . Arthritis   . Arthritis   . HYPERCHOLESTEROLEMIA 04/25/2007   takes Pravastatin daily  . HYPERTENSION 01/03/2007   takes Losartan-HCTZ  and Amlodipine daily  . Hypokalemia    takes Potassium daily  . Joint pain   . Nocturia   . Pneumonia 2 yrs ago  . Primary osteoarthritis of shoulder    Right    Past Surgical History:  Procedure Laterality Date  . CESAREAN SECTION  25 years ago  . CHOLECYSTECTOMY    . COLONOSCOPY    . DILATION AND CURETTAGE OF UTERUS  35 years ago  . HERNIA REPAIR    . INCISIONAL HERNIA REPAIR  02/21/2012   Procedure: LAPAROSCOPIC INCISIONAL HERNIA;  Surgeon: Harl Bowie, MD;  Location: [facility];  Service: General;  Laterality: N/A;  laparoscopic incisional hernia repair with mesh  .  JOINT REPLACEMENT     bilateral knee replacements  . REPLACEMENT TOTAL KNEE BILATERAL  2010  . TOTAL HIP ARTHROPLASTY Right 03/19/2016   Procedure: RIGHT TOTAL HIP ARTHROPLASTY ANTERIOR APPROACH;  Surgeon: Frederik Pear, MD;  Location: [place] [facility];  Service: Orthopedics;  Laterality: Right;  . TOTAL HIP REVISION Right 10/02/2017   Procedure: TOTAL HIP REVISION POSTERIOR;  Surgeon: Frederik Pear, MD;  Location: [facility];  Service: Orthopedics;  Laterality: Right;  . TOTAL SHOULDER ARTHROPLASTY Right 12/19/2017   Procedure: RIGHT TOTAL SHOULDER ARTHROPLASTY;  Surgeon: Tania Ade, MD;  Location: [facility];  Service: Orthopedics;  Laterality: Right;  . TUBAL LIGATION      Social History    Tobacco Use  . Smoking status: Never Smoker  . Smokeless tobacco: Never Used  Substance Use Topics  . Alcohol use: No    Alcohol/week: 0.0 standard drinks    Family History  Problem Relation Age of Onset  . Diabetes Mother   . Heart disease Mother   . Hypertension Mother   . Stroke Mother   . Diabetes Father   . Heart disease Father        Triple Bypass  . Hypertension Father   . Heart attack Brother   . Congestive Heart Failure Brother   . Hypertension Brother   . Heart disease Brother   . Hypertension Sister   . Hypertension Sister   . Ovarian cancer Sister 30  . Hypertension Sister   . Hypertension Sister   . Hypertension Sister   . Hypertension Sister   . Cancer Neg Hx   . Colon cancer Neg Hx     ROS Per hpi  OBJECTIVE:  Today's Vitals   11/02/19 1128  BP: (!) 141/83  Pulse: 69  Temp: 98.1 F (36.7 C)  SpO2: 97%  Weight: 228 lb (103.4 kg)  Height: 5\' 3"  (1.6 m)   Body mass index is 40.39 kg/m.   Physical Exam Vitals and nursing note reviewed.  Constitutional:      Appearance: She is well-developed.  HENT:     Head: Normocephalic and atraumatic.  Eyes:     General: No scleral icterus.    Conjunctiva/sclera: Conjunctivae normal.     Pupils: Pupils are equal, round, and reactive to light.  Pulmonary:     Effort: Pulmonary effort is normal.  Musculoskeletal:     Cervical back: Neck supple.  Skin:    General: Skin is warm and dry.  Neurological:     Mental Status: She is alert and oriented to person, place, and time.     Results for orders placed or performed in visit on 11/02/19 (from the past 24 hour(s))  POCT urinalysis dipstick     Status: None   Collection Time: 11/02/19 11:56 AM  Result Value Ref Range   Color, UA yellow yellow   Clarity, UA clear clear   Glucose, UA negative negative mg/dL   Bilirubin, UA negative negative   Ketones, POC UA negative negative mg/dL   Spec Grav, UA 1.025 1.010 - 1.025   Blood, UA negative  negative   pH, UA 5.5 5.0 - 8.0   Protein Ur, POC negative negative mg/dL   Urobilinogen, UA 0.2 0.2 or 1.0 E.U./dL   Nitrite, UA Negative Negative   Leukocytes, UA Negative Negative    No results found.   ASSESSMENT and PLAN  1. Urge incontinence of urine UA is neg, cx pending. AZO for sx relief, if neg Cx then start oxybutynin for UI.  Reviewed r/se/b - POCT urinalysis dipstick - negative - Urine Culture  Other orders - phenazopyridine (PYRIDIUM) 100 MG tablet; Take 1 tablet (100 mg total) by mouth 3 (three) times daily as needed for pain.  Return if symptoms worsen or fail to improve.    Rutherford Guys, MD [facility], [place] [address] Ph.  [phone] Fax [phone]

## 2019-11-03 LAB — URINE CULTURE

## 2019-11-06 MED ORDER — OXYBUTYNIN CHLORIDE 5 MG PO TABS: 5.0000 mg | ORAL_TABLET | Freq: Two times a day (BID) | ORAL | 5 refills | Status: DC

## 2019-11-06 NOTE — Addendum Note (Signed)
Addended by: Rutherford Guys on: 11/06/2019 01:58 PM   Modules accepted: Orders

## 2019-11-19 ENCOUNTER — Other Ambulatory Visit: Payer: Self-pay | Admitting: Family Medicine

## 2019-11-30 ENCOUNTER — Other Ambulatory Visit: Payer: Self-pay | Admitting: Obstetrics and Gynecology

## 2019-11-30 DIAGNOSIS — Z1231 Encounter for screening mammogram for malignant neoplasm of breast: Secondary | ICD-10-CM

## 2019-12-01 ENCOUNTER — Other Ambulatory Visit: Payer: Self-pay

## 2019-12-01 NOTE — Progress Notes (Signed)
69 y.o. G87P1001 Married [place] American female here for annual exam.    Patient is taking Ditropan for urinary urgency.  She is having much less leakage of urine.  She was voiding every 30 minutes prior to starting medication.  No significant dry mouth.  No constipation.   Denies vaginal bleeding or spotting.   Received her Covid vaccine.   PCP: Grant Fontana, MD  Patient's last menstrual period was 08/30/1998 (approximate).           Sexually active: No.  The current method of family planning is Tubal/post menopausal status.    Exercising: Yes.    stationary bike, squats, stretching  Exercises 3 - 4 times per week.  Smoker:  no  Health Maintenance: Pap: 11-24-18 Neg, 06-08-16 Neg:Neg HR HPV History of abnormal Pap:  Yes, 1990 had abnormal pap but normal on repeat--no colposcopy. Paps normal since. MMG: 12-22-18 3D/Neg/density B/BiRads1. She has an appointment.  Colonoscopy: 03/20/17 Normal repeat 10 years BMD: 02-02-19  Result :Normal TDaP: 10-02-13 Gardasil:   no HIV: 05-17-16 NR Hep C:03-10-13 Neg Screening Labs:  PCP.    reports that she has never smoked. She has never used smokeless tobacco. She reports that she does not drink alcohol and does not use drugs.  Past Medical History:  Diagnosis Date  . Abdominal hernia    RLQ  . Allergy   . Arthritis   . Arthritis   . HYPERCHOLESTEROLEMIA 04/25/2007   takes Pravastatin daily  . HYPERTENSION 01/03/2007   takes Losartan-HCTZ  and Amlodipine daily  . Hypokalemia    takes Potassium daily  . Joint pain   . Nocturia   . Pneumonia 2 yrs ago  . Primary osteoarthritis of shoulder    Right    Past Surgical History:  Procedure Laterality Date  . CESAREAN SECTION  25 years ago  . CHOLECYSTECTOMY    . COLONOSCOPY    . DILATION AND CURETTAGE OF UTERUS  35 years ago  . HERNIA REPAIR    . INCISIONAL HERNIA REPAIR  02/21/2012   Procedure: LAPAROSCOPIC INCISIONAL HERNIA;  Surgeon: Harl Bowie, MD;  Location: [facility];   Service: General;  Laterality: N/A;  laparoscopic incisional hernia repair with mesh  . JOINT REPLACEMENT     bilateral knee replacements  . REPLACEMENT TOTAL KNEE BILATERAL  2010  . TOTAL HIP ARTHROPLASTY Right 03/19/2016   Procedure: RIGHT TOTAL HIP ARTHROPLASTY ANTERIOR APPROACH;  Surgeon: Frederik Pear, MD;  Location: [facility];  Service: Orthopedics;  Laterality: Right;  . TOTAL HIP REVISION Right 10/02/2017   Procedure: TOTAL HIP REVISION POSTERIOR;  Surgeon: Frederik Pear, MD;  Location: [place];  Service: Orthopedics;  Laterality: Right;  . TOTAL SHOULDER ARTHROPLASTY Right 12/19/2017   Procedure: RIGHT TOTAL SHOULDER ARTHROPLASTY;  Surgeon: Tania Ade, MD;  Location: [facility];  Service: Orthopedics;  Laterality: Right;  . TUBAL LIGATION      Current Outpatient Medications  Medication Sig Dispense Refill  . amLODipine (NORVASC) 2.5 MG tablet TAKE 1 TABLET BY MOUTH EVERY DAY 90 tablet 0  . cetirizine-pseudoephedrine (ZYRTEC-D) 5-120 MG tablet Take 1 tablet by mouth daily as needed for allergies.    . Olopatadine HCl 0.2 % SOLN Apply 1 drop to eye daily. 2.5 mL 5  . oxybutynin (DITROPAN) 5 MG tablet Take 1 tablet (5 mg total) by mouth 2 (two) times daily. 60 tablet 5  . potassium chloride SA (KLOR-CON M20) 20 MEQ tablet Take 1 tablet (20 mEq total) by mouth 2 (two) times  daily. 180 tablet 1  . pravastatin (PRAVACHOL) 20 MG tablet Take 1 tablet (20 mg total) by mouth every evening. 90 tablet 3  . telmisartan-hydrochlorothiazide (MICARDIS HCT) 80-25 MG tablet TAKE 1 TABLET BY MOUTH EVERY DAY 90 tablet 1  . triamcinolone cream (KENALOG) 0.1 % Apply 1 application topically 2 (two) times daily. 30 g 0   No current facility-administered medications for this visit.    Family History  Problem Relation Age of Onset  . Diabetes Mother   . Heart disease Mother   . Hypertension Mother   . Stroke Mother   . Diabetes Father   . Heart disease Father        Triple Bypass  . Hypertension Father   .  Heart attack Brother   . Congestive Heart Failure Brother   . Hypertension Brother   . Heart disease Brother   . Hypertension Sister   . Hypertension Sister   . Ovarian cancer Sister 72  . Hypertension Sister   . Hypertension Sister   . Hypertension Sister   . Hypertension Sister   . Cancer Neg Hx   . Colon cancer Neg Hx     Review of Systems  All other systems reviewed and are negative.   Exam:   BP 124/78 (Cuff Size: Large)   Pulse 70   Temp (!) 96.4 F (35.8 C) (Temporal)   Resp 16   Ht 5\' 2"  (1.575 m)   Wt 229 lb (103.9 kg)   LMP 08/30/1998 (Approximate)   BMI 41.88 kg/m     General appearance: alert, cooperative and appears stated age Head: normocephalic, without obvious abnormality, atraumatic Neck: no adenopathy, supple, symmetrical, trachea midline and thyroid normal to inspection and palpation Lungs: clear to auscultation bilaterally Breasts: normal appearance, no masses or tenderness, No nipple retraction or dimpling, No nipple discharge or bleeding, No axillary adenopathy Heart: regular rate and rhythm Abdomen: soft, non-tender; no masses, no organomegaly Extremities: extremities normal, atraumatic, no cyanosis or edema Skin: skin color, texture, turgor normal. No rashes or lesions Lymph nodes: cervical, supraclavicular, and axillary nodes normal. Neurologic: grossly normal  Pelvic: External genitalia:  no lesions              No abnormal inguinal nodes palpated.              Urethra:  normal appearing urethra with no masses, tenderness or lesions              Bartholins and Skenes: normal                 Vagina: normal appearing vagina with normal color and discharge, no lesions              Cervix: no lesions              Pap taken: No. Bimanual Exam:  Uterus:  normal size, contour, position, consistency, mobility, non-tender              Adnexa: right adnexal mass noted versus fibrosis.               Rectal exam: Yes.  .  Confirms.              Anus:   normal sphincter tone, no lesions  Chaperone was present for exam.  Assessment:   Well woman visit with gynecologic exam.  Right adnexal mass.  Overactive bladder.  Improved on Ditropan bid.  FH ovarian cancer in sister.  Plan: Mammogram screening discussed.  Self breast awareness reviewed. Pap and HR HPV as above. Guidelines for Calcium, Vitamin D, regular exercise program including cardiovascular and weight bearing exercise. Return for pelvic US and CA125.  Follow up annually and prn.   After visit summary provided.

## 2019-12-02 ENCOUNTER — Ambulatory Visit (INDEPENDENT_AMBULATORY_CARE_PROVIDER_SITE_OTHER): Payer: Medicare HMO | Admitting: Obstetrics and Gynecology

## 2019-12-02 ENCOUNTER — Encounter: Payer: Self-pay | Admitting: Obstetrics and Gynecology

## 2019-12-02 ENCOUNTER — Telehealth: Payer: Self-pay | Admitting: Obstetrics and Gynecology

## 2019-12-02 VITALS — BP 124/78 | HR 70 | Temp 96.4°F | Resp 16 | Ht 62.0 in | Wt 229.0 lb

## 2019-12-02 DIAGNOSIS — N9489 Other specified conditions associated with female genital organs and menstrual cycle: Secondary | ICD-10-CM

## 2019-12-02 DIAGNOSIS — Z01419 Encounter for gynecological examination (general) (routine) without abnormal findings: Secondary | ICD-10-CM | POA: Diagnosis not present

## 2019-12-02 NOTE — Patient Instructions (Signed)

## 2019-12-02 NOTE — Telephone Encounter (Signed)
Spoke with patient regarding benefits for recommended ultrasound. Patient is aware that ultrasound is transvaginal. Patient acknowledges understanding of information presented. Patient is aware of cancellation policy. Patient scheduled appointment for 12/10/2019 at 0800AM with Brook A. Quincy Simmonds, MD, [facility]. Encounter closed.

## 2019-12-10 ENCOUNTER — Encounter: Payer: Self-pay | Admitting: Obstetrics and Gynecology

## 2019-12-10 ENCOUNTER — Ambulatory Visit (INDEPENDENT_AMBULATORY_CARE_PROVIDER_SITE_OTHER): Payer: Medicare HMO

## 2019-12-10 ENCOUNTER — Other Ambulatory Visit: Payer: Self-pay

## 2019-12-10 ENCOUNTER — Telehealth: Payer: Self-pay | Admitting: Obstetrics and Gynecology

## 2019-12-10 ENCOUNTER — Ambulatory Visit: Payer: Medicare HMO | Admitting: Obstetrics and Gynecology

## 2019-12-10 VITALS — BP 130/82 | HR 70 | Ht 62.0 in | Wt 229.0 lb

## 2019-12-10 DIAGNOSIS — D219 Benign neoplasm of connective and other soft tissue, unspecified: Secondary | ICD-10-CM | POA: Diagnosis not present

## 2019-12-10 DIAGNOSIS — N9489 Other specified conditions associated with female genital organs and menstrual cycle: Secondary | ICD-10-CM | POA: Diagnosis not present

## 2019-12-10 DIAGNOSIS — N859 Noninflammatory disorder of uterus, unspecified: Secondary | ICD-10-CM

## 2019-12-10 NOTE — Patient Instructions (Signed)
Endometrial Biopsy  Endometrial biopsy is a procedure in which a tissue sample is taken from inside the uterus. The sample is taken from the endometrium, which is the lining of the uterus. The tissue sample is then checked under a microscope to see if the tissue is normal or abnormal. This procedure helps to determine where you are in your menstrual cycle and how hormone levels are affecting the lining of the uterus. This procedure may also be used to evaluate uterine bleeding or to diagnose endometrial cancer, endometrial tuberculosis, polyps, or other inflammatory conditions. Tell a health care provider about:  Any allergies you have.  All medicines you are taking, including vitamins, herbs, eye drops, creams, and over-the-counter medicines.  Any problems you or family members have had with anesthetic medicines.  Any blood disorders you have.  Any surgeries you have had.  Any medical conditions you have.  Whether you are pregnant or may be pregnant. What are the risks? Generally, this is a safe procedure. However, problems may occur, including:  Bleeding.  Pelvic infection.  Puncture of the wall of the uterus with the biopsy device (rare). What happens before the procedure?  Keep a record of your menstrual cycles as told by your health care provider. You may need to schedule your procedure for a specific time in your cycle.  You may want to bring a sanitary pad to wear after the procedure.  Ask your health care provider about: ? Changing or stopping your regular medicines. This is especially important if you are taking diabetes medicines or blood thinners. ? Taking medicines such as aspirin and ibuprofen. These medicines can thin your blood. Do not take these medicines before your procedure if your health care provider instructs you not to.  Plan to have someone take you home from the hospital or clinic. What happens during the procedure?  To lower your risk of  infection: ? Your health care team will wash or sanitize their hands.  You will lie on an exam table with your feet and legs supported as in a pelvic exam.  Your health care provider will insert an instrument (speculum) into your vagina to see your cervix.  Your cervix will be cleansed with an antiseptic solution.  A medicine (local anesthetic) will be used to numb the cervix.  A forceps instrument (tenaculum) will be used to hold your cervix steady for the biopsy.  A thin, rod-like instrument (uterine sound) will be inserted through your cervix to determine the length of your uterus and the location where the biopsy sample will be removed.  A thin, flexible tube (catheter) will be inserted through your cervix and into the uterus. The catheter will be used to collect the biopsy sample from your endometrial tissue.  The catheter and speculum will then be removed, and the tissue sample will be sent to a lab for examination. What happens after the procedure?  You will rest in a recovery area until you are ready to go home.  You may have mild cramping and a small amount of vaginal bleeding. This is normal.  It is up to you to get the results of your procedure. Ask your health care provider, or the department that is doing the procedure, when your results will be ready. Summary  Endometrial biopsy is a procedure in which a tissue sample is taken from the endometrium, which is the lining of the uterus.  This procedure may help to diagnose menstrual cycle problems, abnormal bleeding, or other conditions affecting   the endometrium.  Before the procedure, keep a record of your menstrual cycles as told by your health care provider.  The tissue sample that is removed will be checked under a microscope to see if it is normal or abnormal. This information is not intended to replace advice given to you by your health care provider. Make sure you discuss any questions you have with your health care  provider. Document Revised: 05/10/2017 Document Reviewed: 06/13/2016 Elsevier Patient Education  [place]. Uterine Fibroids  Uterine fibroids (leiomyomas) are noncancerous (benign) tumors that can develop in the uterus. Fibroids may also develop in the fallopian tubes, cervix, or tissues (ligaments) near the uterus. You may have one or many fibroids. Fibroids vary in size, weight, and where they grow in the uterus. Some can become quite large. Most fibroids do not require medical treatment. What are the causes? The cause of this condition is not known. What increases the risk? You are more likely to develop this condition if you:  Are in your 30s or 40s and have not gone through menopause.  Have a family history of this condition.  Are of African-American descent.  Had your first period at an early age (early menarche).  Have not had any children (nulliparity).  Are overweight or obese. What are the signs or symptoms? Many women do not have any symptoms. Symptoms of this condition may include:  Heavy menstrual bleeding.  Bleeding or spotting between periods.  Pain and pressure in the pelvic area, between the hips.  Bladder problems, such as needing to urinate urgently or more often than usual.  Inability to have children (infertility).  Failure to carry pregnancy to term (miscarriage). How is this diagnosed? This condition may be diagnosed based on:  Your symptoms and medical history.  A physical exam.  A pelvic exam that includes feeling for any tumors.  Imaging tests, such as ultrasound or MRI. How is this treated? Treatment for this condition may include:  Seeing your health care provider for follow-up visits to monitor your fibroids for any changes.  Taking NSAIDs such as ibuprofen, naproxen, or aspirin to reduce pain.  Hormone medicines. These may be taken as a pill, given in an injection, or delivered by a T-shaped device that is inserted into the  uterus (intrauterine device, IUD).  Surgery to remove one of the following: ? The fibroids (myomectomy). Your health care provider may recommend this if fibroids affect your fertility and you want to become pregnant. ? The uterus (hysterectomy). ? Blood supply to the fibroids (uterine artery embolization). Follow these instructions at home:  Take over-the-counter and prescription medicines only as told by your health care provider.  Ask your health care provider if you should take iron pills or eat more iron-rich foods, such as dark green, leafy vegetables. Heavy menstrual bleeding can cause low iron levels.  If directed, apply heat to your back or abdomen to reduce pain. Use the heat source that your health care provider recommends, such as a moist heat pack or a heating pad. ? Place a towel between your skin and the heat source. ? Leave the heat on for 20-30 minutes. ? Remove the heat if your skin turns bright red. This is especially important if you are unable to feel pain, heat, or cold. You may have a greater risk of getting burned.  Pay close attention to your menstrual cycle. Tell your health care provider about any changes, such as: ? Increased blood flow that requires you to  use more pads or tampons than usual. ? A change in the number of days that your period lasts. ? A change in symptoms that are associated with your period, such as back pain or cramps in your abdomen.  Keep all follow-up visits as told by your health care provider. This is important, especially if your fibroids need to be monitored for any changes. Contact a health care provider if you:  Have pelvic pain, back pain, or cramps in your abdomen that do not get better with medicine or heat.  Develop new bleeding between periods.  Have increased bleeding during or between periods.  Feel unusually tired or weak.  Feel light-headed. Get help right away if you:  Faint.  Have pelvic pain that suddenly gets  worse.  Have severe vaginal bleeding that soaks a tampon or pad in 30 minutes or less. Summary  Uterine fibroids are noncancerous (benign) tumors that can develop in the uterus.  The exact cause of this condition is not known.  Most fibroids do not require medical treatment unless they affect your ability to have children (fertility).  Contact a health care provider if you have pelvic pain, back pain, or cramps in your abdomen that do not get better with medicines.  Make sure you know what symptoms should cause you to get help right away. This information is not intended to replace advice given to you by your health care provider. Make sure you discuss any questions you have with your health care provider. Document Revised: 05/10/2017 Document Reviewed: 04/23/2017 Elsevier Patient Education  2020 [employer].

## 2019-12-10 NOTE — Progress Notes (Signed)
Encounter reviewed by Dr. Jesica Goheen Amundson C. Silva.  

## 2019-12-10 NOTE — Progress Notes (Signed)
GYNECOLOGY  VISIT   HPI: 69 y.o.   Married  [place] American  female   [phone] with Patient's last menstrual period was 08/30/1998 (approximate).   here for pelvic ultrasound for a right adnexal mass noted at time of routine pelvic exam on 12/02/19.   Denies vaginal bleeding or pain.   Not taking any hormonal therapy.  FH of ovarian cancer in sister.  Patient is not clear of the diagnosis today.   GYNECOLOGIC HISTORY: Patient's last menstrual period was 08/30/1998 (approximate). Contraception: Tubal/PMP Menopausal hormone therapy: none Last mammogram:  12-22-18 3D/Neg/density B/BiRads1 Last pap smear:  11-24-18 Neg, 06-08-16 Neg:Neg HR HPV        OB History    Gravida  1   Para  1   Term  1   Preterm      AB      Living  1     SAB      TAB      Ectopic      Multiple      Live Births  1              Patient Active Problem List   Diagnosis Date Noted  . Prediabetes 07/21/2019  . 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 Arthritis of right hip 03/19/2016  . Primary osteoarthritis of right hip 03/18/2016  . Hypokalemia 03/29/2014  . Arthralgia 02/11/2014  . Routine general medical examination at a health care facility 03/28/2013  . Nonspecific abnormal findings on radiological and examination of lung field 03/27/2013  . Cough 03/11/2013  . Unspecified disorder of liver 03/09/2013  . Pyuria 03/09/2013  . Arthritis   . Ventral hernia 01/18/2012  . Encounter for long-term (current) use of other medications 07/05/2011  . HYPERGLYCEMIA 08/12/2008  . HYPERCHOLESTEROLEMIA 04/25/2007  . DIZZINESS AND GIDDINESS 04/25/2007  . Essential hypertension 01/03/2007  . ALLERGIC RHINITIS 01/03/2007    Past Medical History:  Diagnosis Date  . Abdominal hernia    RLQ  . Allergy   . Arthritis   . Arthritis   . HYPERCHOLESTEROLEMIA 04/25/2007   takes Pravastatin daily  . HYPERTENSION 01/03/2007    takes Losartan-HCTZ  and Amlodipine daily  . Hypokalemia    takes Potassium daily  . Joint pain   . Nocturia   . Pneumonia 2 yrs ago  . Primary osteoarthritis of shoulder    Right    Past Surgical History:  Procedure Laterality Date  . CESAREAN SECTION  25 years ago  . CHOLECYSTECTOMY    . COLONOSCOPY    . DILATION AND CURETTAGE OF UTERUS  35 years ago  . HERNIA REPAIR    . INCISIONAL HERNIA REPAIR  02/21/2012   Procedure: LAPAROSCOPIC INCISIONAL HERNIA;  Surgeon: Harl Bowie, MD;  Location: [place] [facility];  Service: General;  Laterality: N/A;  laparoscopic incisional hernia repair with mesh  . JOINT REPLACEMENT     bilateral knee replacements  . REPLACEMENT TOTAL KNEE BILATERAL  2010  . TOTAL HIP ARTHROPLASTY Right 03/19/2016   Procedure: RIGHT TOTAL HIP ARTHROPLASTY ANTERIOR APPROACH;  Surgeon: Frederik Pear, MD;  Location: [place];  Service: Orthopedics;  Laterality: Right;  . TOTAL HIP REVISION Right 10/02/2017   Procedure: TOTAL HIP REVISION POSTERIOR;  Surgeon: Frederik Pear, MD;  Location: [place];  Service: Orthopedics;  Laterality: Right;  . TOTAL SHOULDER ARTHROPLASTY Right 12/19/2017   Procedure: RIGHT TOTAL SHOULDER ARTHROPLASTY;  Surgeon: Tania Ade, MD;  Location:  [facility];  Service: Orthopedics;  Laterality: Right;  . TUBAL LIGATION      Current Outpatient Medications  Medication Sig Dispense Refill  . amLODipine (NORVASC) 2.5 MG tablet TAKE 1 TABLET BY MOUTH EVERY DAY 90 tablet 0  . cetirizine-pseudoephedrine (ZYRTEC-D) 5-120 MG tablet Take 1 tablet by mouth daily as needed for allergies.    . Olopatadine HCl 0.2 % SOLN Apply 1 drop to eye daily. 2.5 mL 5  . oxybutynin (DITROPAN) 5 MG tablet Take 1 tablet (5 mg total) by mouth 2 (two) times daily. 60 tablet 5  . potassium chloride SA (KLOR-CON M20) 20 MEQ tablet Take 1 tablet (20 mEq total) by mouth 2 (two) times daily. 180 tablet 1  . pravastatin (PRAVACHOL) 20 MG tablet Take 1 tablet (20 mg total) by mouth every  evening. 90 tablet 3  . telmisartan-hydrochlorothiazide (MICARDIS HCT) 80-25 MG tablet TAKE 1 TABLET BY MOUTH EVERY DAY 90 tablet 1  . triamcinolone cream (KENALOG) 0.1 % Apply 1 application topically 2 (two) times daily. 30 g 0   No current facility-administered medications for this visit.     ALLERGIES: Lisinopril and Oxycodone  Family History  Problem Relation Age of Onset  . Diabetes Mother   . Heart disease Mother   . Hypertension Mother   . Stroke Mother   . Diabetes Father   . Heart disease Father        Triple Bypass  . Hypertension Father   . Heart attack Brother   . Congestive Heart Failure Brother   . Hypertension Brother   . Heart disease Brother   . Hypertension Sister   . Hypertension Sister   . Ovarian cancer Sister 86  . Hypertension Sister   . Hypertension Sister   . Hypertension Sister   . Hypertension Sister   . Cancer Neg Hx   . Colon cancer Neg Hx     Social History   Socioeconomic History  . Marital status: Married    Spouse name: Not on file  . Number of children: 1  . Years of education: 12th grade +  . Highest education level: Not on file  Occupational History  . Occupation: Beautician  Tobacco Use  . Smoking status: Never Smoker  . Smokeless tobacco: Never Used  Vaping Use  . Vaping Use: Never used  Substance and Sexual Activity  . Alcohol use: No    Alcohol/week: 0.0 standard drinks  . Drug use: No  . Sexual activity: Not Currently    Partners: Male    Birth control/protection: Post-menopausal  Other Topics Concern  . Not on file  Social History Narrative   Lives with her husband.   Adult daughter lives nearby.   Social Determinants of Health   Financial Resource Strain:   . Difficulty of Paying Living Expenses:   Food Insecurity:   . Worried About Charity fundraiser in the Last Year:   . Arboriculturist in the Last Year:   Transportation Needs:   . Film/video editor (Medical):   Marland Kitchen Lack of Transportation  (Non-Medical):   Physical Activity:   . Days of Exercise per Week:   . Minutes of Exercise per Session:   Stress:   . Feeling of Stress :   Social Connections:   . Frequency of Communication with Friends and Family:   . Frequency of Social Gatherings with Friends and Family:   . Attends Religious Services:   . Active Member of Clubs or Organizations:   .  Attends Archivist Meetings:   Marland Kitchen Marital Status:   Intimate Partner Violence:   . Fear of Current or Ex-Partner:   . Emotionally Abused:   Marland Kitchen Physically Abused:   . Sexually Abused:     Review of Systems  All other systems reviewed and are negative.   PHYSICAL EXAMINATION:    BP 130/82 (Cuff Size: Large)   Pulse 70   Ht 5\' 2"  (1.575 m)   Wt 229 lb (103.9 kg)   LMP 08/30/1998 (Approximate)   BMI 41.88 kg/m     General appearance: alert, cooperative and appears stated age  Pelvic US  Uterus with multiple fibroids, largest is right pedunculated fibroid 4.8 cm.  EMS 3.5 mm and fluid in the endometrial canal.  Ovaries are normal.  No free fluid.  ASSESSMENT  Fluid in the endometrial canal with EMS over 3 mm.  Fibroids. FH of gynecologic cancer.   PLAN  We discussed her ultrasound findings and correlation with her recent pelvic exam indicating a right adnexal mass.  This is the pedunculated fibroid. We discussed fibroids.  We reviewed the findings of fluid in the endometrial canal and the need for further evaluation with an endometrial biopsy.  Procedure explained along with risks and benefits.  She will clarify her sister's gynecologic malignancy and report this back to me at her endometrial biopsy appointment.     __30____ minutes consultation.

## 2019-12-17 NOTE — Telephone Encounter (Signed)
Spoke with pt. Pt called to schedule EMB procedure. Pt scheduled for EMB on 12/24/19 at 1100 am with Dr Quincy Simmonds. Pt agreeable and verbalized understanding.   Routing to Dr Quincy Simmonds.  Encounter closed.  Cc: Rosa for [employer].

## 2019-12-17 NOTE — Telephone Encounter (Signed)
Call placed to convey benefits. Spoke with the patient and conveyed the benefits. Patient understands/agreeable with the benefits.   Please call patient for scheduling an appointment with Dr. Quincy Simmonds.

## 2019-12-22 NOTE — Progress Notes (Signed)
GYNECOLOGY  VISIT   HPI: 69 y.o.   Married  [place] American  female   [phone] with Patient's last menstrual period was 08/30/1998 (approximate).   here for endometrial biopsy.  Pelvic US showed fluid in the endometrial canal and EMS 3.5 mm.  Her ovaries were normal.  Two sisters had uterine cancer.  No one had ovarian cancer.  GYNECOLOGIC HISTORY: Patient's last menstrual period was 08/30/1998 (approximate). Contraception: Tubal Menopausal hormone therapy:  none Last mammogram: 12-22-18 3D/Neg/density B/BiRads1 Last pap smear: 11-24-18 Neg, 06-08-16 Neg:Neg HR HPV        OB History    Gravida  1   Para  1   Term  1   Preterm      AB      Living  1     SAB      TAB      Ectopic      Multiple      Live Births  1              Patient Active Problem List   Diagnosis Date Noted  . Prediabetes 07/21/2019  . 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 Arthritis of right hip 03/19/2016  . Primary osteoarthritis of right hip 03/18/2016  . Hypokalemia 03/29/2014  . Arthralgia 02/11/2014  . Routine general medical examination at a health care facility 03/28/2013  . Nonspecific abnormal findings on radiological and examination of lung field 03/27/2013  . Cough 03/11/2013  . Unspecified disorder of liver 03/09/2013  . Pyuria 03/09/2013  . Arthritis   . Ventral hernia 01/18/2012  . Encounter for long-term (current) use of other medications 07/05/2011  . HYPERGLYCEMIA 08/12/2008  . HYPERCHOLESTEROLEMIA 04/25/2007  . DIZZINESS AND GIDDINESS 04/25/2007  . Essential hypertension 01/03/2007  . ALLERGIC RHINITIS 01/03/2007    Past Medical History:  Diagnosis Date  . Abdominal hernia    RLQ  . Allergy   . Arthritis   . Arthritis   . HYPERCHOLESTEROLEMIA 04/25/2007   takes Pravastatin daily  . HYPERTENSION 01/03/2007   takes Losartan-HCTZ  and Amlodipine daily  . Hypokalemia    takes  Potassium daily  . Joint pain   . Nocturia   . Pneumonia 2 yrs ago  . Primary osteoarthritis of shoulder    Right    Past Surgical History:  Procedure Laterality Date  . CESAREAN SECTION  25 years ago  . CHOLECYSTECTOMY    . COLONOSCOPY    . DILATION AND CURETTAGE OF UTERUS  35 years ago  . HERNIA REPAIR    . INCISIONAL HERNIA REPAIR  02/21/2012   Procedure: LAPAROSCOPIC INCISIONAL HERNIA;  Surgeon: Harl Bowie, MD;  Location: [facility];  Service: General;  Laterality: N/A;  laparoscopic incisional hernia repair with mesh  . JOINT REPLACEMENT     bilateral knee replacements  . REPLACEMENT TOTAL KNEE BILATERAL  2010  . TOTAL HIP ARTHROPLASTY Right 03/19/2016   Procedure: RIGHT TOTAL HIP ARTHROPLASTY ANTERIOR APPROACH;  Surgeon: Frederik Pear, MD;  Location: [place];  Service: Orthopedics;  Laterality: Right;  . TOTAL HIP REVISION Right 10/02/2017   Procedure: TOTAL HIP REVISION POSTERIOR;  Surgeon: Frederik Pear, MD;  Location: [place];  Service: Orthopedics;  Laterality: Right;  . TOTAL SHOULDER ARTHROPLASTY Right 12/19/2017   Procedure: RIGHT TOTAL SHOULDER ARTHROPLASTY;  Surgeon: Tania Ade, MD;  Location: [facility];  Service: Orthopedics;  Laterality: Right;  . TUBAL LIGATION  Current Outpatient Medications  Medication Sig Dispense Refill  . amLODipine (NORVASC) 2.5 MG tablet TAKE 1 TABLET BY MOUTH EVERY DAY 90 tablet 0  . cetirizine-pseudoephedrine (ZYRTEC-D) 5-120 MG tablet Take 1 tablet by mouth daily as needed for allergies.    . Olopatadine HCl 0.2 % SOLN Apply 1 drop to eye daily. 2.5 mL 5  . oxybutynin (DITROPAN) 5 MG tablet Take 1 tablet (5 mg total) by mouth 2 (two) times daily. 60 tablet 5  . potassium chloride SA (KLOR-CON M20) 20 MEQ tablet Take 1 tablet (20 mEq total) by mouth 2 (two) times daily. 180 tablet 1  . pravastatin (PRAVACHOL) 20 MG tablet Take 1 tablet (20 mg total) by mouth every evening. 90 tablet 3  . telmisartan-hydrochlorothiazide (MICARDIS HCT)  80-25 MG tablet TAKE 1 TABLET BY MOUTH EVERY DAY 90 tablet 1  . triamcinolone cream (KENALOG) 0.1 % Apply 1 application topically 2 (two) times daily. 30 g 0   No current facility-administered medications for this visit.     ALLERGIES: Lisinopril and Oxycodone  Family History  Problem Relation Age of Onset  . Diabetes Mother   . Heart disease Mother   . Hypertension Mother   . Stroke Mother   . Diabetes Father   . Heart disease Father        Triple Bypass  . Hypertension Father   . Heart attack Brother   . Congestive Heart Failure Brother   . Hypertension Brother   . Heart disease Brother   . Hypertension Sister   . Hypertension Sister   . Cancer Sister        uterine  . Hypertension Sister   . Hypertension Sister   . Hypertension Sister   . Hypertension Sister   . Cancer Sister        uterine  . Colon cancer Neg Hx     Social History   Socioeconomic History  . Marital status: Married    Spouse name: Not on file  . Number of children: 1  . Years of education: 12th grade +  . Highest education level: Not on file  Occupational History  . Occupation: Beautician  Tobacco Use  . Smoking status: Never Smoker  . Smokeless tobacco: Never Used  Vaping Use  . Vaping Use: Never used  Substance and Sexual Activity  . Alcohol use: No    Alcohol/week: 0.0 standard drinks  . Drug use: No  . Sexual activity: Not Currently    Partners: Male    Birth control/protection: Post-menopausal  Other Topics Concern  . Not on file  Social History Narrative   Lives with her husband.   Adult daughter lives nearby.   Social Determinants of Health   Financial Resource Strain:   . Difficulty of Paying Living Expenses:   Food Insecurity:   . Worried About Charity fundraiser in the Last Year:   . Arboriculturist in the Last Year:   Transportation Needs:   . Film/video editor (Medical):   Marland Kitchen Lack of Transportation (Non-Medical):   Physical Activity:   . Days of Exercise  per Week:   . Minutes of Exercise per Session:   Stress:   . Feeling of Stress :   Social Connections:   . Frequency of Communication with Friends and Family:   . Frequency of Social Gatherings with Friends and Family:   . Attends Religious Services:   . Active Member of Clubs or Organizations:   . Attends Club  or Organization Meetings:   Marland Kitchen Marital Status:   Intimate Partner Violence:   . Fear of Current or Ex-Partner:   . Emotionally Abused:   Marland Kitchen Physically Abused:   . Sexually Abused:     Review of Systems  All other systems reviewed and are negative.   PHYSICAL EXAMINATION:    BP 132/74 (Cuff Size: Large)   Pulse 80   Ht 5\' 2"  (1.575 m)   Wt 228 lb 6.4 oz (103.6 kg)   LMP 08/30/1998 (Approximate)   BMI 41.77 kg/m     General appearance: alert, cooperative and appears stated age   EMB Consent for procedure.  Speculum placed.  Hibiclens prep.  Paracervical block with 7 cc 1% lidocaine.  Lot [id number], exp 05/11/20. Tenaculum to anterior cervical lip.  Scalpel used to open os.  Cervix dilated with small metal dilator and then os finder.  Pipelle passed to 5 cm x 1 and 9 cm x 1.  Tissue to pathology.  No complications.  Minimal EBL.   Chaperone was present for exam.  ASSESSMENT  Fluid in endometrial canal.  FH endometrial cancer.    PLAN  FU EMB.  Post biopsy precautions given.

## 2019-12-24 ENCOUNTER — Other Ambulatory Visit (HOSPITAL_COMMUNITY)
Admission: RE | Admit: 2019-12-24 | Discharge: 2019-12-24 | Disposition: A | Discharge status: Home / Self Care | Payer: Medicare HMO | Source: Ambulatory Visit | Attending: Obstetrics and Gynecology | Admitting: Obstetrics and Gynecology

## 2019-12-24 ENCOUNTER — Ambulatory Visit: Payer: Medicare HMO | Admitting: Obstetrics and Gynecology

## 2019-12-24 ENCOUNTER — Encounter: Payer: Self-pay | Admitting: Obstetrics and Gynecology

## 2019-12-24 ENCOUNTER — Other Ambulatory Visit: Payer: Self-pay

## 2019-12-24 DIAGNOSIS — N858 Other specified noninflammatory disorders of uterus: Secondary | ICD-10-CM | POA: Diagnosis not present

## 2019-12-24 DIAGNOSIS — N859 Noninflammatory disorder of uterus, unspecified: Secondary | ICD-10-CM | POA: Diagnosis not present

## 2019-12-24 NOTE — Patient Instructions (Signed)

## 2019-12-25 ENCOUNTER — Ambulatory Visit: Payer: Medicare HMO

## 2019-12-28 LAB — SURGICAL PATHOLOGY

## 2020-01-15 ENCOUNTER — Ambulatory Visit
Admission: RE | Admit: 2020-01-15 | Discharge: 2020-01-15 | Disposition: A | Discharge status: Home / Self Care | Payer: Medicare HMO | Source: Ambulatory Visit | Attending: Obstetrics and Gynecology | Admitting: Obstetrics and Gynecology

## 2020-01-15 ENCOUNTER — Other Ambulatory Visit: Payer: Self-pay

## 2020-01-15 DIAGNOSIS — Z1231 Encounter for screening mammogram for malignant neoplasm of breast: Secondary | ICD-10-CM

## 2020-01-19 ENCOUNTER — Ambulatory Visit: Payer: Medicare HMO | Admitting: Family Medicine

## 2020-02-10 ENCOUNTER — Ambulatory Visit: Payer: Medicare HMO | Admitting: Registered Nurse

## 2020-02-10 ENCOUNTER — Ambulatory Visit: Payer: Self-pay

## 2020-02-10 ENCOUNTER — Telehealth: Payer: Self-pay | Admitting: *Deleted

## 2020-02-10 VITALS — BP 132/74 | Ht 62.0 in | Wt 225.0 lb

## 2020-02-10 DIAGNOSIS — Z0000 Encounter for general adult medical examination without abnormal findings: Secondary | ICD-10-CM

## 2020-02-10 NOTE — Progress Notes (Signed)
Presents today for [employer] Visit   Date of last exam: 11/02/2019  Interpreter used for this visit?  No  I connected with  Angela Estrada on 02/10/20 by a telephone and verified that I am speaking with the correct person using two identifiers.   I discussed the limitations of evaluation and management by telemedicine. The patient expressed understanding and agreed to proceed.  Patient location: home  Provider location: in office  I provided   20  minutes of non face - to - face time during this encounter.  Patient Care Team: Rutherford Guys, MD as PCP - General (Family Medicine) Nunzio Cobbs, MD as Attending Physician (Obstetrics and Gynecology) Tania Ade, MD as Consulting Physician (Orthopedic Surgery) Frederik Pear, MD as Consulting Physician (Orthopedic Surgery)   Other items to address today:   Discussed Eye/Dental Discussed Immunizations Will schedule Follow up in October for Hypertension and FLUSHOT    Other Screening: Last screening for diabetes: 07/21/2019 Last lipid screening: 07/21/2019  ADVANCE DIRECTIVES: Discussed: yes On File: no Materials Provided: yes  Immunization status:  Immunization History  Administered Date(s) Administered  . Fluad Quad(high Dose 65+) 02/10/2019  . Influenza Split 05/20/2012  . Influenza, High Dose Seasonal PF 03/13/2018  . Influenza,inj,Quad PF,6+ Mos 02/11/2014, 03/01/2015, 03/21/2016, 04/02/2017  . PFIZER SARS-COV-2 Vaccination 08/04/2019, 08/25/2019  . Pneumococcal Conjugate-13 11/01/2017  . Pneumococcal Polysaccharide-23 03/21/2016  . Td 08/04/2001  . Tdap 10/02/2013  . Zoster 03/29/2014  . Zoster Recombinat (Shingrix) 01/21/2019, 09/17/2019     Health Maintenance Due  Topic Date Due  . INFLUENZA VACCINE  01/10/2020     Functional Status Survey: Is the patient deaf or have difficulty hearing?: No Does the patient have difficulty seeing, even when wearing  glasses/contacts?: No Does the patient have difficulty concentrating, remembering, or making decisions?: No Does the patient have difficulty walking or climbing stairs?: No Does the patient have difficulty dressing or bathing?: No Does the patient have difficulty doing errands alone such as visiting a doctor's office or shopping?: No   6CIT Screen 02/10/2020 02/03/2019  What Year? 0 points 0 points  What month? 0 points 0 points  What time? 0 points 0 points  Count back from 20 0 points 0 points  Months in reverse 0 points 0 points  Repeat phrase 0 points 0 points  Total Score 0 0        Clinical Support from 02/10/2020 in [facility]  AUDIT-C Score 0       Home Environment:   No trouble climbing stairs Lives one story with basement No scattered rugs Yes grab bars Adequate lighting/ no clutter   Patient Active Problem List   Diagnosis Date Noted  . Prediabetes 07/21/2019  . 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 Arthritis of right hip 03/19/2016  . Primary osteoarthritis of right hip 03/18/2016  . Hypokalemia 03/29/2014  . Arthralgia 02/11/2014  . Routine general medical examination at a health care facility 03/28/2013  . Nonspecific abnormal findings on radiological and examination of lung field 03/27/2013  . Cough 03/11/2013  . Unspecified disorder of liver 03/09/2013  . Pyuria 03/09/2013  . Arthritis   . Ventral hernia 01/18/2012  . Encounter for long-term (current) use of other medications 07/05/2011  . HYPERGLYCEMIA 08/12/2008  . HYPERCHOLESTEROLEMIA 04/25/2007  . DIZZINESS AND GIDDINESS 04/25/2007  . Essential hypertension 01/03/2007  . ALLERGIC RHINITIS 01/03/2007  Past Medical History:  Diagnosis Date  . Abdominal hernia    RLQ  . Allergy   . Arthritis   . Arthritis   . HYPERCHOLESTEROLEMIA 04/25/2007   takes Pravastatin daily  . HYPERTENSION 01/03/2007   takes  Losartan-HCTZ  and Amlodipine daily  . Hypokalemia    takes Potassium daily  . Joint pain   . Nocturia   . Pneumonia 2 yrs ago  . Primary osteoarthritis of shoulder    Right     Past Surgical History:  Procedure Laterality Date  . CESAREAN SECTION  25 years ago  . CHOLECYSTECTOMY    . COLONOSCOPY    . DILATION AND CURETTAGE OF UTERUS  35 years ago  . HERNIA REPAIR    . INCISIONAL HERNIA REPAIR  02/21/2012   Procedure: LAPAROSCOPIC INCISIONAL HERNIA;  Surgeon: Harl Bowie, MD;  Location: [place];  Service: General;  Laterality: N/A;  laparoscopic incisional hernia repair with mesh  . JOINT REPLACEMENT     bilateral knee replacements  . REPLACEMENT TOTAL KNEE BILATERAL  2010  . TOTAL HIP ARTHROPLASTY Right 03/19/2016   Procedure: RIGHT TOTAL HIP ARTHROPLASTY ANTERIOR APPROACH;  Surgeon: Frederik Pear, MD;  Location: [place];  Service: Orthopedics;  Laterality: Right;  . TOTAL HIP REVISION Right 10/02/2017   Procedure: TOTAL HIP REVISION POSTERIOR;  Surgeon: Frederik Pear, MD;  Location: [place];  Service: Orthopedics;  Laterality: Right;  . TOTAL SHOULDER ARTHROPLASTY Right 12/19/2017   Procedure: RIGHT TOTAL SHOULDER ARTHROPLASTY;  Surgeon: Tania Ade, MD;  Location: [facility];  Service: Orthopedics;  Laterality: Right;  . TUBAL LIGATION       Family History  Problem Relation Age of Onset  . Diabetes Mother   . Heart disease Mother   . Hypertension Mother   . Stroke Mother   . Diabetes Father   . Heart disease Father        Triple Bypass  . Hypertension Father   . Heart attack Brother   . Congestive Heart Failure Brother   . Hypertension Brother   . Heart disease Brother   . Hypertension Sister   . Hypertension Sister   . Cancer Sister        uterine  . Hypertension Sister   . Hypertension Sister   . Hypertension Sister   . Hypertension Sister   . Cancer Sister        uterine  . Colon cancer Neg Hx      Social History   Socioeconomic History  . Marital  status: Married    Spouse name: Not on file  . Number of children: 1  . Years of education: 12th grade +  . Highest education level: Not on file  Occupational History  . Occupation: Beautician  Tobacco Use  . Smoking status: Never Smoker  . Smokeless tobacco: Never Used  Vaping Use  . Vaping Use: Never used  Substance and Sexual Activity  . Alcohol use: No    Alcohol/week: 0.0 standard drinks  . Drug use: No  . Sexual activity: Not Currently    Partners: Male    Birth control/protection: Post-menopausal  Other Topics Concern  . Not on file  Social History Narrative   Lives with her husband.   Adult daughter lives nearby.   Social Determinants of Health   Financial Resource Strain:   . Difficulty of Paying Living Expenses: Not on file  Food Insecurity:   . Worried About Charity fundraiser in the Last Year: Not  on file  . Ran Out of Food in the Last Year: Not on file  Transportation Needs:   . Lack of Transportation (Medical): Not on file  . Lack of Transportation (Non-Medical): Not on file  Physical Activity:   . Days of Exercise per Week: Not on file  . Minutes of Exercise per Session: Not on file  Stress:   . Feeling of Stress : Not on file  Social Connections:   . Frequency of Communication with Friends and Family: Not on file  . Frequency of Social Gatherings with Friends and Family: Not on file  . Attends Religious Services: Not on file  . Active Member of Clubs or Organizations: Not on file  . Attends Archivist Meetings: Not on file  . Marital Status: Not on file  Intimate Partner Violence:   . Fear of Current or Ex-Partner: Not on file  . Emotionally Abused: Not on file  . Physically Abused: Not on file  . Sexually Abused: Not on file     Allergies  Allergen Reactions  . Lisinopril Cough  . Oxycodone Other (See Comments)    Hallucinations     Prior to Admission medications   Medication Sig Start Date End Date Taking? Authorizing  Provider  amLODipine (NORVASC) 2.5 MG tablet TAKE 1 TABLET BY MOUTH EVERY DAY 11/19/19  Yes Rutherford Guys, MD  cetirizine-pseudoephedrine (ZYRTEC-D) 5-120 MG tablet Take 1 tablet by mouth daily as needed for allergies.   Yes [provider]  Olopatadine HCl 0.2 % SOLN Apply 1 drop to eye daily. 07/21/19  Yes Rutherford Guys, MD  oxybutynin (DITROPAN) 5 MG tablet Take 1 tablet (5 mg total) by mouth 2 (two) times daily. 11/06/19  Yes Rutherford Guys, MD  potassium chloride SA (KLOR-CON M20) 20 MEQ tablet Take 1 tablet (20 mEq total) by mouth 2 (two) times daily. 07/21/19  Yes Rutherford Guys, MD  pravastatin (PRAVACHOL) 20 MG tablet Take 1 tablet (20 mg total) by mouth every evening. 07/21/19  Yes Rutherford Guys, MD  telmisartan-hydrochlorothiazide (MICARDIS HCT) 80-25 MG tablet TAKE 1 TABLET BY MOUTH EVERY DAY 10/02/19  Yes Rutherford Guys, MD  triamcinolone cream (KENALOG) 0.1 % Apply 1 application topically 2 (two) times daily. 03/13/18  Yes Rutherford Guys, MD     Depression screen [facility] 2/9 02/10/2020 11/02/2019 07/21/2019 02/03/2019 01/19/2019  Decreased Interest 0 0 0 0 0  Down, Depressed, Hopeless 0 0 0 0 0  PHQ - 2 Score 0 0 0 0 0     Fall Risk  02/10/2020 11/02/2019 07/21/2019 02/03/2019 01/19/2019  Falls in the past year? 0 0 0 0 0  Number falls in past yr: 0 0 0 0 0  Injury with Fall? 0 0 0 0 0  Follow up Falls evaluation completed;Education provided Falls evaluation completed - Falls evaluation completed;Education provided;Falls prevention discussed Falls evaluation completed      PHYSICAL EXAM: BP 132/74 Comment: not in clinic  Ht 5\' 2"  (1.575 m)   Wt 225 lb (102.1 kg)   LMP 08/30/1998 (Approximate)   BMI 41.15 kg/m    Wt Readings from Last 3 Encounters:  02/10/20 225 lb (102.1 kg)  12/24/19 228 lb 6.4 oz (103.6 kg)  12/10/19 229 lb (103.9 kg)      ASSESSMENT/PLAN: There are no diagnoses linked to this encounter.

## 2020-02-10 NOTE — Telephone Encounter (Signed)
Please call patient and book 6th month follow up for hypertension for sometime in the middle of October.  Thank You

## 2020-02-10 NOTE — Telephone Encounter (Signed)
Called pt and sch appt for 03/14/20

## 2020-02-10 NOTE — Patient Instructions (Addendum)
Thank you for taking time to come for your Medicare Wellness Visit. I appreciate your ongoing commitment to your health goals. Please review the following plan we discussed and let me know if I can assist you in the future.  Julie Greer LPN  Preventive Care 67 Years and Older, Female Preventive care refers to lifestyle choices and visits with your health care provider that can promote health and wellness. This includes:  A yearly physical exam. This is also called an annual well check.  Regular dental and eye exams.  Immunizations.  Screening for certain conditions.  Healthy lifestyle choices, such as diet and exercise. What can I expect for my preventive care visit? Physical exam Your health care provider will check:  Height and weight. These may be used to calculate body mass index (BMI), which is a measurement that tells if you are at a healthy weight.  Heart rate and blood pressure.  Your skin for abnormal spots. Counseling Your health care provider may ask you questions about:  Alcohol, tobacco, and drug use.  Emotional well-being.  Home and relationship well-being.  Sexual activity.  Eating habits.  History of falls.  Memory and ability to understand (cognition).  Work and work environment.  Pregnancy and menstrual history. What immunizations do I need?  Influenza (flu) vaccine  This is recommended every year. Tetanus, diphtheria, and pertussis (Tdap) vaccine  You may need a Td booster every 10 years. Varicella (chickenpox) vaccine  You may need this vaccine if you have not already been vaccinated. Zoster (shingles) vaccine  You may need this after age 60. Pneumococcal conjugate (PCV13) vaccine  One dose is recommended after age 67. Pneumococcal polysaccharide (PPSV23) vaccine  One dose is recommended after age 67. Measles, mumps, and rubella (MMR) vaccine  You may need at least one dose of MMR if you were born in 1957 or later. You may also  need a second dose. Meningococcal conjugate (MenACWY) vaccine  You may need this if you have certain conditions. Hepatitis A vaccine  You may need this if you have certain conditions or if you travel or work in places where you may be exposed to hepatitis A. Hepatitis B vaccine  You may need this if you have certain conditions or if you travel or work in places where you may be exposed to hepatitis B. Haemophilus influenzae type b (Hib) vaccine  You may need this if you have certain conditions. You may receive vaccines as individual doses or as more than one vaccine together in one shot (combination vaccines). Talk with your health care provider about the risks and benefits of combination vaccines. What tests do I need? Blood tests  Lipid and cholesterol levels. These may be checked every 5 years, or more frequently depending on your overall health.  Hepatitis C test.  Hepatitis B test. Screening  Lung cancer screening. You may have this screening every year starting at age 55 if you have a 30-pack-year history of smoking and currently smoke or have quit within the past 15 years.  Colorectal cancer screening. All adults should have this screening starting at age 50 and continuing until age 75. Your health care provider may recommend screening at age 45 if you are at increased risk. You will have tests every 1-10 years, depending on your results and the type of screening test.  Diabetes screening. This is done by checking your blood sugar (glucose) after you have not eaten for a while (fasting). You may have this done every 1-3   years.  Mammogram. This may be done every 1-2 years. Talk with your health care provider about how often you should have regular mammograms.  BRCA-related cancer screening. This may be done if you have a family history of breast, ovarian, tubal, or peritoneal cancers. Other tests  Sexually transmitted disease (STD) testing.  Bone density scan. This is done  to screen for osteoporosis. You may have this done starting at age 52. Follow these instructions at home: Eating and drinking  Eat a diet that includes fresh fruits and vegetables, whole grains, lean protein, and low-fat dairy products. Limit your intake of foods with high amounts of sugar, saturated fats, and salt.  Take vitamin and mineral supplements as recommended by your health care provider.  Do not drink alcohol if your health care provider tells you not to drink.  If you drink alcohol: ? Limit how much you have to 0-1 drink a day. ? Be aware of how much alcohol is in your drink. In the [place] one drink equals one 12 oz bottle of beer (355 mL), one 5 oz glass of wine (148 mL), or one 1 oz glass of hard liquor (44 mL). Lifestyle  Take daily care of your teeth and gums.  Stay active. Exercise for at least 30 minutes on 5 or more days each week.  Do not use any products that contain nicotine or tobacco, such as cigarettes, e-cigarettes, and chewing tobacco. If you need help quitting, ask your health care provider.  If you are sexually active, practice safe sex. Use a condom or other form of protection in order to prevent STIs (sexually transmitted infections).  Talk with your health care provider about taking a low-dose aspirin or statin. What's next?  Go to your health care provider once a year for a well check visit.  Ask your health care provider how often you should have your eyes and teeth checked.  Stay up to date on all vaccines. This information is not intended to replace advice given to you by your health care provider. Make sure you discuss any questions you have with your health care provider. Document Revised: 05/22/2018 Document Reviewed: 05/22/2018 Elsevier Patient Education  2020 [employer].

## 2020-02-16 ENCOUNTER — Other Ambulatory Visit: Payer: Self-pay | Admitting: Family Medicine

## 2020-03-14 ENCOUNTER — Encounter: Payer: Self-pay | Admitting: Family Medicine

## 2020-03-14 ENCOUNTER — Other Ambulatory Visit: Payer: Self-pay

## 2020-03-14 ENCOUNTER — Ambulatory Visit (INDEPENDENT_AMBULATORY_CARE_PROVIDER_SITE_OTHER): Payer: Medicare HMO | Admitting: Family Medicine

## 2020-03-14 VITALS — BP 133/76 | HR 76 | Temp 98.0°F | Resp 15 | Ht 62.0 in | Wt 229.5 lb

## 2020-03-14 DIAGNOSIS — I10 Essential (primary) hypertension: Secondary | ICD-10-CM | POA: Diagnosis not present

## 2020-03-14 DIAGNOSIS — Z23 Encounter for immunization: Secondary | ICD-10-CM

## 2020-03-14 DIAGNOSIS — N3941 Urge incontinence: Secondary | ICD-10-CM | POA: Diagnosis not present

## 2020-03-14 DIAGNOSIS — K219 Gastro-esophageal reflux disease without esophagitis: Secondary | ICD-10-CM

## 2020-03-14 DIAGNOSIS — E7800 Pure hypercholesterolemia, unspecified: Secondary | ICD-10-CM

## 2020-03-14 DIAGNOSIS — R7309 Other abnormal glucose: Secondary | ICD-10-CM | POA: Diagnosis not present

## 2020-03-14 MED ORDER — OXYBUTYNIN CHLORIDE 5 MG PO TABS: 5.0000 mg | ORAL_TABLET | Freq: Two times a day (BID) | ORAL | 1 refills | Status: DC

## 2020-03-14 MED ORDER — PRAVASTATIN SODIUM 20 MG PO TABS: 20.0000 mg | ORAL_TABLET | Freq: Every evening | ORAL | 3 refills | Status: DC

## 2020-03-14 MED ORDER — OMEPRAZOLE 20 MG PO CPDR: 20.0000 mg | DELAYED_RELEASE_CAPSULE | Freq: Every day | ORAL | 0 refills | Status: DC | PRN

## 2020-03-14 MED ORDER — TELMISARTAN-HCTZ 80-25 MG PO TABS: 1.0000 | ORAL_TABLET | Freq: Every day | ORAL | 1 refills | Status: DC

## 2020-03-14 MED ORDER — POTASSIUM CHLORIDE CRYS ER 20 MEQ PO TBCR: 20.0000 meq | EXTENDED_RELEASE_TABLET | Freq: Two times a day (BID) | ORAL | 1 refills | Status: DC

## 2020-03-14 MED ORDER — FAMOTIDINE 20 MG PO TABS: 20.0000 mg | ORAL_TABLET | Freq: Two times a day (BID) | ORAL | 0 refills | Status: DC

## 2020-03-14 MED ORDER — AMLODIPINE BESYLATE 2.5 MG PO TABS: 2.5000 mg | ORAL_TABLET | Freq: Every day | ORAL | 1 refills | Status: DC

## 2020-03-14 NOTE — Progress Notes (Signed)
10/4/20214:33 PM  Angela Estrada [birth year], 69 y.o., female [id number]  Chief Complaint  Patient presents with  . Hypertension    pt doing well does not take BP at home but denies physical symptoms   . Gastroesophageal Reflux    pt has been struggling with reflux the last 3 months has been taking OTC prilosec 20 mg capsules but notes it has been expensive, and would like RX so they will be covered     HPI:   Patient is a 69 y.o. female with past medical history significant for HTN, HLP, prediabetes, OA right hip s/p THA, hypokalemia who presents today for routine followup  Last OV may 2021 started meds for UI  She is overall doing well She is tolerating oxybutynin well She reports it is working well for her UI She takes her meds as rx wo issues She was having some issues with reflux and started taking prilosec which helped but now every time she stops taking Prilosec has resurgence of heartburn Uses cane  Wt Readings from Last 3 Encounters:  03/14/20 229 lb 8 oz (104.1 kg)  02/10/20 225 lb (102.1 kg)  12/24/19 228 lb 6.4 oz (103.6 kg)   BP Readings from Last 3 Encounters:  03/14/20 133/76  02/10/20 132/74  12/24/19 132/74   Lab Results  Component Value Date   HGBA1C 5.9 (H) 07/21/2019   HGBA1C 6.0 (H) 01/19/2019   HGBA1C 5.9 (H) 03/13/2018   Lab Results  Component Value Date   MICROALBUR 0.7 04/05/2015   LDLCALC 102 (H) 07/21/2019   CREATININE 0.77 07/21/2019    Depression screen PHQ 2/9 03/14/2020 02/10/2020 11/02/2019  Decreased Interest 0 0 0  Down, Depressed, Hopeless 0 0 0  PHQ - 2 Score 0 0 0    Fall Risk  03/14/2020 02/10/2020 11/02/2019 07/21/2019 02/03/2019  Falls in the past year? 0 0 0 0 0  Number falls in past yr: 0 0 0 0 0  Injury with Fall? 0 0 0 0 0  Risk for fall due to : No Fall Risks - - - -  Follow up Falls evaluation completed Falls evaluation completed;Education provided Falls evaluation completed - Falls evaluation completed;Education  provided;Falls prevention discussed     Allergies  Allergen Reactions  . Lisinopril Cough  . Oxycodone Other (See Comments)    Hallucinations    Prior to Admission medications   Medication Sig Start Date End Date Taking? Authorizing Provider  amLODipine (NORVASC) 2.5 MG tablet TAKE 1 TABLET BY MOUTH EVERY DAY 02/16/20  Yes Rutherford Guys, MD  cetirizine-pseudoephedrine (ZYRTEC-D) 5-120 MG tablet Take 1 tablet by mouth daily as needed for allergies.   Yes [provider]  Olopatadine HCl 0.2 % SOLN Apply 1 drop to eye daily. 07/21/19  Yes Rutherford Guys, MD  oxybutynin (DITROPAN) 5 MG tablet Take 1 tablet (5 mg total) by mouth 2 (two) times daily. 11/06/19  Yes Rutherford Guys, MD  potassium chloride SA (KLOR-CON M20) 20 MEQ tablet Take 1 tablet (20 mEq total) by mouth 2 (two) times daily. 07/21/19  Yes Rutherford Guys, MD  pravastatin (PRAVACHOL) 20 MG tablet Take 1 tablet (20 mg total) by mouth every evening. 07/21/19  Yes Rutherford Guys, MD  telmisartan-hydrochlorothiazide (MICARDIS HCT) 80-25 MG tablet TAKE 1 TABLET BY MOUTH EVERY DAY 10/02/19  Yes Rutherford Guys, MD  triamcinolone cream (KENALOG) 0.1 % Apply 1 application topically 2 (two) times daily. 03/13/18  Yes Rutherford Guys, MD  Past Medical History:  Diagnosis Date  . Abdominal hernia    RLQ  . Allergy   . Arthritis   . Arthritis   . HYPERCHOLESTEROLEMIA 04/25/2007   takes Pravastatin daily  . HYPERTENSION 01/03/2007   takes Losartan-HCTZ  and Amlodipine daily  . Hypokalemia    takes Potassium daily  . Joint pain   . Nocturia   . Pneumonia 2 yrs ago  . Primary osteoarthritis of shoulder    Right    Past Surgical History:  Procedure Laterality Date  . CESAREAN SECTION  25 years ago  . CHOLECYSTECTOMY    . COLONOSCOPY    . DILATION AND CURETTAGE OF UTERUS  35 years ago  . HERNIA REPAIR    . INCISIONAL HERNIA REPAIR  02/21/2012   Procedure: LAPAROSCOPIC INCISIONAL HERNIA;  Surgeon: Harl Bowie, MD;  Location: [place];  Service: General;  Laterality: N/A;  laparoscopic incisional hernia repair with mesh  . JOINT REPLACEMENT     bilateral knee replacements  . REPLACEMENT TOTAL KNEE BILATERAL  2010  . TOTAL HIP ARTHROPLASTY Right 03/19/2016   Procedure: RIGHT TOTAL HIP ARTHROPLASTY ANTERIOR APPROACH;  Surgeon: Frederik Pear, MD;  Location: [place];  Service: Orthopedics;  Laterality: Right;  . TOTAL HIP REVISION Right 10/02/2017   Procedure: TOTAL HIP REVISION POSTERIOR;  Surgeon: Frederik Pear, MD;  Location: [place];  Service: Orthopedics;  Laterality: Right;  . TOTAL SHOULDER ARTHROPLASTY Right 12/19/2017   Procedure: RIGHT TOTAL SHOULDER ARTHROPLASTY;  Surgeon: Tania Ade, MD;  Location[facility];  Service: Orthopedics;  Laterality: Right;  . TUBAL LIGATION      Social History   Tobacco Use  . Smoking status: Never Smoker  . Smokeless tobacco: Never Used  Substance Use Topics  . Alcohol use: No    Alcohol/week: 0.0 standard drinks    Family History  Problem Relation Age of Onset  . Diabetes Mother   . Heart disease Mother   . Hypertension Mother   . Stroke Mother   . Diabetes Father   . Heart disease Father        Triple Bypass  . Hypertension Father   . Heart attack Brother   . Congestive Heart Failure Brother   . Hypertension Brother   . Heart disease Brother   . Hypertension Sister   . Hypertension Sister   . Cancer Sister        uterine  . Hypertension Sister   . Hypertension Sister   . Hypertension Sister   . Hypertension Sister   . Cancer Sister        uterine  . Colon cancer Neg Hx     Review of Systems  Constitutional: Negative for chills and fever.  Respiratory: Negative for cough and shortness of breath.   Cardiovascular: Negative for chest pain, palpitations and leg swelling.  Gastrointestinal: Positive for heartburn. Negative for abdominal pain, blood in stool, constipation, diarrhea, melena, nausea and vomiting.  per hpi   OBJECTIVE:   Today's Vitals   03/14/20 1553  BP: 133/76  Pulse: 76  Resp: 15  Temp: 98 F (36.7 C)  TempSrc: Temporal  SpO2: 98%  Weight: 229 lb 8 oz (104.1 kg)  Height: 5\' 2"  (1.575 m)   Body mass index is 41.98 kg/m.   Physical Exam Vitals and nursing note reviewed.  Constitutional:      Appearance: She is well-developed.  HENT:     Head: Normocephalic and atraumatic.     Mouth/Throat:  Pharynx: No oropharyngeal exudate.  Eyes:     General: No scleral icterus.    Extraocular Movements: Extraocular movements intact.     Conjunctiva/sclera: Conjunctivae normal.     Pupils: Pupils are equal, round, and reactive to light.  Cardiovascular:     Rate and Rhythm: Normal rate and regular rhythm.     Heart sounds: Normal heart sounds. No murmur heard.  No friction rub. No gallop.   Pulmonary:     Effort: Pulmonary effort is normal.     Breath sounds: Normal breath sounds. No wheezing, rhonchi or rales.  Abdominal:     Tenderness: There is no abdominal tenderness.  Musculoskeletal:     Cervical back: Neck supple.     Right lower leg: No edema.     Left lower leg: No edema.  Skin:    General: Skin is warm and dry.  Neurological:     Mental Status: She is alert and oriented to person, place, and time.     No results found for this or any previous visit (from the past 24 hour(s)).  No results found.   ASSESSMENT and PLAN  1. Need for prophylactic vaccination and inoculation against influenza - Flu Vaccine QUAD High Dose(Fluad)  2. Essential hypertension Controlled. Continue current regime.  - Comprehensive metabolic panel  3. HYPERCHOLESTEROLEMIA Checking labs today, medications will be adjusted as needed.  - Lipid panel  4. HYPERGLYCEMIA Labs pending, cont with LFM - Hemoglobin A1c  5. Urge incontinence of urine Controlled. Continue current regime.   6. GERD wo esophagitis Discussed use of pepcid to wean off PPI and then use of PPI only prn (not daily),  reviewed LFM  Other orders - amLODipine (NORVASC) 2.5 MG tablet; Take 1 tablet (2.5 mg total) by mouth daily. - oxybutynin (DITROPAN) 5 MG tablet; Take 1 tablet (5 mg total) by mouth 2 (two) times daily. - potassium chloride SA (KLOR-CON M20) 20 MEQ tablet; Take 1 tablet (20 mEq total) by mouth 2 (two) times daily. - pravastatin (PRAVACHOL) 20 MG tablet; Take 1 tablet (20 mg total) by mouth every evening. - telmisartan-hydrochlorothiazide (MICARDIS HCT) 80-25 MG tablet; Take 1 tablet by mouth daily. - famotidine (PEPCID) 20 MG tablet; Take 1 tablet (20 mg total) by mouth 2 (two) times daily. - omeprazole (PRILOSEC) 20 MG capsule; Take 1 capsule (20 mg total) by mouth daily as needed (reflux/heartburn).  Return in about 6 months (around 09/12/2020).    Rutherford Guys, MD [facility], [place] [address] Ph.  [phone] Fax [phone]

## 2020-03-14 NOTE — Patient Instructions (Addendum)
If you have lab work done today you will be contacted with your lab results within the next 2 weeks.  If you have not heard from [place] then please contact us. The fastest way to get your results is to register for My Chart.   IF you received an x-ray today, you will receive an invoice from [facility]. Please contact [facility] at [phone] with questions or concerns regarding your invoice.   IF you received labwork today, you will receive an invoice from [place]. Please contact [facility] at [phone] with questions or concerns regarding your invoice.   Our billing staff will not be able to assist you with questions regarding bills from these companies.  You will be contacted with the lab results as soon as they are available. The fastest way to get your results is to activate your My Chart account. Instructions are located on the last page of this paperwork. If you have not heard from [place] regarding the results in 2 weeks, please contact this office.     Food Choices for Gastroesophageal Reflux Disease, Adult When you have gastroesophageal reflux disease (GERD), the foods you eat and your eating habits are very important. Choosing the right foods can help ease your discomfort. Think about working with a nutrition specialist (dietitian) to help you make good choices. What are tips for following this plan?  Meals  Choose healthy foods that are low in fat, such as fruits, vegetables, whole grains, low-fat dairy products, and lean meat, fish, and poultry.  Eat small meals often instead of 3 large meals a day. Eat your meals slowly, and in a place where you are relaxed. Avoid bending over or lying down until 2-3 hours after eating.  Avoid eating meals 2-3 hours before bed.  Avoid drinking a lot of liquid with meals.  Cook foods using methods other than frying. Bake, grill, or broil food instead.  Avoid or limit: ? Chocolate. ? Peppermint or  spearmint. ? Alcohol. ? Pepper. ? Black and decaffeinated coffee. ? Black and decaffeinated tea. ? Bubbly (carbonated) soft drinks. ? Caffeinated energy drinks and soft drinks.  Limit high-fat foods such as: ? Fatty meat or fried foods. ? Whole milk, cream, butter, or ice cream. ? Nuts and nut butters. ? Pastries, donuts, and sweets made with butter or shortening.  Avoid foods that cause symptoms. These foods may be different for everyone. Common foods that cause symptoms include: ? Tomatoes. ? Oranges, lemons, and limes. ? Peppers. ? Spicy food. ? Onions and garlic. ? Vinegar. Lifestyle  Maintain a healthy weight. Ask your doctor what weight is healthy for you. If you need to lose weight, work with your doctor to do so safely.  Exercise for at least 30 minutes for 5 or more days each week, or as told by your doctor.  Wear loose-fitting clothes.  Do not smoke. If you need help quitting, ask your doctor.  Sleep with the head of your bed higher than your feet. Use a wedge under the mattress or blocks under the bed frame to raise the head of the bed. Summary  When you have gastroesophageal reflux disease (GERD), food and lifestyle choices are very important in easing your symptoms.  Eat small meals often instead of 3 large meals a day. Eat your meals slowly, and in a place where you are relaxed.  Limit high-fat foods such as fatty meat or fried foods.  Avoid bending over or lying down until 2-3 hours after  eating.  Avoid peppermint and spearmint, caffeine, alcohol, and chocolate. This information is not intended to replace advice given to you by your health care provider. Make sure you discuss any questions you have with your health care provider. Document Revised: 09/18/2018 Document Reviewed: 07/03/2016 Elsevier Patient Education  [place].

## 2020-03-14 NOTE — Progress Notes (Signed)
[id number]  

## 2020-03-15 LAB — COMPREHENSIVE METABOLIC PANEL
ALT: 19 IU/L (ref 0–32)
AST: 23 IU/L (ref 0–40)
Albumin/Globulin Ratio: 1.5 (ref 1.2–2.2)
Albumin: 4.6 g/dL (ref 3.8–4.8)
Alkaline Phosphatase: 78 IU/L (ref 44–121)
BUN/Creatinine Ratio: 13 (ref 12–28)
BUN: 9 mg/dL (ref 8–27)
Bilirubin Total: 0.5 mg/dL (ref 0.0–1.2)
CO2: 27 mmol/L (ref 20–29)
Calcium: 9.8 mg/dL (ref 8.7–10.3)
Chloride: 101 mmol/L (ref 96–106)
Creatinine, Ser: 0.72 mg/dL (ref 0.57–1.00)
GFR calc Af Amer: 100 mL/min/{1.73_m2} (ref >59)
GFR calc non Af Amer: 87 mL/min/{1.73_m2} (ref >59)
Globulin, Total: 3 g/dL (ref 1.5–4.5)
Glucose: 108 mg/dL — ABNORMAL HIGH (ref 65–99)
Potassium: 4.2 mmol/L (ref 3.5–5.2)
Sodium: 140 mmol/L (ref 134–144)
Total Protein: 7.6 g/dL (ref 6.0–8.5)

## 2020-03-15 LAB — LIPID PANEL
Chol/HDL Ratio: 3 ratio (ref 0.0–4.4)
Cholesterol, Total: 176 mg/dL (ref 100–199)
HDL: 59 mg/dL (ref >39)
LDL Chol Calc (NIH): 95 mg/dL (ref 0–99)
Triglycerides: 125 mg/dL (ref 0–149)
VLDL Cholesterol Cal: 22 mg/dL (ref 5–40)

## 2020-03-15 LAB — HEMOGLOBIN A1C
Est. average glucose Bld gHb Est-mCnc: 128 mg/dL
Hgb A1c MFr Bld: 6.1 % — ABNORMAL HIGH (ref 4.8–5.6)

## 2020-03-18 ENCOUNTER — Telehealth: Payer: Self-pay | Admitting: Family Medicine

## 2020-03-18 NOTE — Telephone Encounter (Signed)
Called pt back and she understands now the plan for the prilosec and pepcid

## 2020-03-18 NOTE — Telephone Encounter (Signed)
Pt just missed our call ok to call her on [phone]  She will pick up

## 2020-03-18 NOTE — Telephone Encounter (Signed)
Tried calling pt but documented phone number is different from what's on file and neither has a name or a VM pt will have to call back though no changes noted to her medications

## 2020-03-18 NOTE — Telephone Encounter (Signed)
Patient is needs clarification on these two medicines / how often  omeprazole (PRILOSEC) 20 MG capsule [[id number] famotidine (PEPCID) 20 MG tablet [[id number]  Patient understood MD ws weaning off Prilosec    Please advise patient [phone]

## 2020-03-29 DIAGNOSIS — Z96652 Presence of left artificial knee joint: Secondary | ICD-10-CM | POA: Diagnosis not present

## 2020-03-29 DIAGNOSIS — Z96641 Presence of right artificial hip joint: Secondary | ICD-10-CM | POA: Diagnosis not present

## 2020-03-29 DIAGNOSIS — M25561 Pain in right knee: Secondary | ICD-10-CM | POA: Diagnosis not present

## 2020-03-29 DIAGNOSIS — M25551 Pain in right hip: Secondary | ICD-10-CM | POA: Diagnosis not present

## 2020-03-29 DIAGNOSIS — M25562 Pain in left knee: Secondary | ICD-10-CM | POA: Diagnosis not present

## 2020-03-29 DIAGNOSIS — Z96651 Presence of right artificial knee joint: Secondary | ICD-10-CM | POA: Diagnosis not present

## 2020-03-31 ENCOUNTER — Other Ambulatory Visit: Payer: Self-pay | Admitting: Orthopedic Surgery

## 2020-03-31 DIAGNOSIS — M25551 Pain in right hip: Secondary | ICD-10-CM

## 2020-04-05 ENCOUNTER — Telehealth: Payer: Self-pay | Admitting: Registered Nurse

## 2020-04-05 ENCOUNTER — Other Ambulatory Visit: Payer: Self-pay

## 2020-04-05 MED ORDER — FAMOTIDINE 20 MG PO TABS
20.0000 mg | ORAL_TABLET | Freq: Two times a day (BID) | ORAL | 0 refills | Status: DC
Start: 2020-04-05 — End: 2020-06-28

## 2020-04-05 NOTE — Telephone Encounter (Signed)
Pt was checking to be sure she was taking this correctly and also needed refill, pt was taking it correctly 1 tablet two times daily pt was also provided refill to [facility]

## 2020-04-05 NOTE — Telephone Encounter (Signed)
Pt is wanting a nurse to call her regarding pts Rx below on when she should take it. She states it says twice daily but she wants to know two at one time or at different times. Covering provider is NP Orland Mustard. Please advise.   famotidine (PEPCID) 20 MG tablet [[id number]]

## 2020-04-15 ENCOUNTER — Ambulatory Visit
Admission: RE | Admit: 2020-04-15 | Discharge: 2020-04-15 | Disposition: A | Discharge status: Home / Self Care | Payer: Medicare HMO | Source: Ambulatory Visit | Attending: Orthopedic Surgery | Admitting: Orthopedic Surgery

## 2020-04-15 DIAGNOSIS — M25551 Pain in right hip: Secondary | ICD-10-CM

## 2020-04-15 DIAGNOSIS — M6258 Muscle wasting and atrophy, not elsewhere classified, other site: Secondary | ICD-10-CM | POA: Diagnosis not present

## 2020-04-26 ENCOUNTER — Other Ambulatory Visit: Payer: Self-pay | Admitting: Orthopedic Surgery

## 2020-04-26 DIAGNOSIS — T8484XA Pain due to internal orthopedic prosthetic devices, implants and grafts, initial encounter: Secondary | ICD-10-CM | POA: Diagnosis not present

## 2020-04-26 DIAGNOSIS — Z96641 Presence of right artificial hip joint: Secondary | ICD-10-CM | POA: Diagnosis not present

## 2020-04-26 DIAGNOSIS — M25551 Pain in right hip: Secondary | ICD-10-CM | POA: Diagnosis not present

## 2020-04-27 ENCOUNTER — Telehealth: Payer: Self-pay | Admitting: Family Medicine

## 2020-04-27 NOTE — Telephone Encounter (Signed)
Received a fax from [place] for surgery clearance.Pt is a former [place] pt. Paperwork is in attending provider Dr. Carlota Raspberry it is in his  folder in his box beside the nurses station. Please advise.

## 2020-04-28 NOTE — Telephone Encounter (Signed)
Disregard - has appt scheduled.

## 2020-04-28 NOTE — Telephone Encounter (Signed)
Please schedule preop clearance visit, any provider (I have not seen her, prior pt. of Dr. Pamella Pert)

## 2020-04-28 NOTE — Telephone Encounter (Signed)
Pt has surgical clearance form that needs completing.Surgery is scheduled for 05/02/2020. Form is located at nurse station

## 2020-05-02 ENCOUNTER — Encounter: Payer: Self-pay | Admitting: Family Medicine

## 2020-05-02 ENCOUNTER — Other Ambulatory Visit: Payer: Self-pay

## 2020-05-02 ENCOUNTER — Ambulatory Visit (INDEPENDENT_AMBULATORY_CARE_PROVIDER_SITE_OTHER): Payer: Medicare HMO | Admitting: Family Medicine

## 2020-05-02 ENCOUNTER — Ambulatory Visit (INDEPENDENT_AMBULATORY_CARE_PROVIDER_SITE_OTHER): Payer: Medicare HMO

## 2020-05-02 VITALS — BP 140/70 | HR 83 | Temp 97.9°F | Ht 62.0 in | Wt 227.0 lb

## 2020-05-02 DIAGNOSIS — Z01810 Encounter for preprocedural cardiovascular examination: Secondary | ICD-10-CM | POA: Diagnosis not present

## 2020-05-02 DIAGNOSIS — Z01818 Encounter for other preprocedural examination: Secondary | ICD-10-CM

## 2020-05-02 DIAGNOSIS — J9811 Atelectasis: Secondary | ICD-10-CM | POA: Diagnosis not present

## 2020-05-02 LAB — POCT URINALYSIS DIP (MANUAL ENTRY)
Bilirubin, UA: NEGATIVE
Blood, UA: NEGATIVE
Glucose, UA: NEGATIVE mg/dL
Ketones, POC UA: NEGATIVE mg/dL
Leukocytes, UA: NEGATIVE
Nitrite, UA: NEGATIVE
Protein Ur, POC: NEGATIVE mg/dL
Spec Grav, UA: 1.025 (ref 1.010–1.025)
Urobilinogen, UA: 0.2 E.U./dL
pH, UA: 5 (ref 5.0–8.0)

## 2020-05-02 NOTE — Progress Notes (Signed)
Subjective:  Patient ID: Angela Estrada, female    DOB: [birth year]  Age: 69 y.o. MRN: [id number]  CC:  Chief Complaint  Patient presents with  . surgical clearance    Pt is having hip surgery on 05/23/2020.     HPI Angela Estrada presents for   Preoperative evaluation for planned R hip surgery December 13. Prior failed THR. Loosening, planned new THR.   Intermediate risk surgery. Dr. Mayer Estrada.   Previous patient of Dr. Pamella Estrada, with history of hypertension, hyperlipidemia, prediabetes, previous THA of right hip with osteoarthritis - , and hypokalemia.  Has been treated for urge incontinence with oxybutynin.  Pepcid for GERD. Off prilosec.   Underwent general anesthesia for right total shoulder arthroplasty in July 2019.  No known reactions or difficulty with anesthesia at that time.  BP Readings from Last 3 Encounters:  05/02/20 140/70  03/14/20 133/76  02/10/20 132/74   Lab Results  Component Value Date   CREATININE 0.72 03/14/2020   Lab Results  Component Value Date   HGBA1C 6.1 (H) 03/14/2020     no missed doses of meds.  No hx of heart disease known.  No hx of TIA/CVA, CHF, ischemic cariac dz, renal insufficiency, no insulin.  RCRI Angela Estrada risk score 0.  METs - stationary bike few days per week. No CP/DOE with stairs - limited by hip only.  No hx of OSA, no known lung disease.      History Patient Active Problem List   Diagnosis Date Noted  . Prediabetes 07/21/2019  . 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 Arthritis of right hip 03/19/2016  . Primary osteoarthritis of right hip 03/18/2016  . Hypokalemia 03/29/2014  . Arthralgia 02/11/2014  . Routine general medical examination at a health care facility 03/28/2013  . Nonspecific abnormal findings on radiological and examination of lung field 03/27/2013  . Cough 03/11/2013  . Unspecified disorder of liver 03/09/2013  . Pyuria  03/09/2013  . Arthritis   . Ventral hernia 01/18/2012  . Encounter for long-term (current) use of other medications 07/05/2011  . HYPERGLYCEMIA 08/12/2008  . HYPERCHOLESTEROLEMIA 04/25/2007  . DIZZINESS AND GIDDINESS 04/25/2007  . Essential hypertension 01/03/2007  . ALLERGIC RHINITIS 01/03/2007   Past Medical History:  Diagnosis Date  . Abdominal hernia    RLQ  . Allergy   . Arthritis   . Arthritis   . HYPERCHOLESTEROLEMIA 04/25/2007   takes Pravastatin daily  . HYPERTENSION 01/03/2007   takes Losartan-HCTZ  and Amlodipine daily  . Hypokalemia    takes Potassium daily  . Joint pain   . Nocturia   . Pneumonia 2 yrs ago  . Primary osteoarthritis of shoulder    Right   Past Surgical History:  Procedure Laterality Date  . CESAREAN SECTION  25 years ago  . CHOLECYSTECTOMY    . COLONOSCOPY    . DILATION AND CURETTAGE OF UTERUS  35 years ago  . HERNIA REPAIR    . INCISIONAL HERNIA REPAIR  02/21/2012   Procedure: LAPAROSCOPIC INCISIONAL HERNIA;  Surgeon: Angela Bowie, MD;  Location: [place];  Service: General;  Laterality: N/A;  laparoscopic incisional hernia repair with mesh  . JOINT REPLACEMENT     bilateral knee replacements  . REPLACEMENT TOTAL KNEE BILATERAL  2010  . TOTAL HIP ARTHROPLASTY Right 03/19/2016   Procedure: RIGHT TOTAL HIP ARTHROPLASTY ANTERIOR APPROACH;  Surgeon: Angela Pear, MD;  Location: [place];  Service: Orthopedics;  Laterality: Right;  . TOTAL HIP REVISION Right 10/02/2017   Procedure: TOTAL HIP REVISION POSTERIOR;  Surgeon: Angela Pear, MD;  Location: [place];  Service: Orthopedics;  Laterality: Right;  . TOTAL SHOULDER ARTHROPLASTY Right 12/19/2017   Procedure: RIGHT TOTAL SHOULDER ARTHROPLASTY;  Surgeon: Angela Ade, MD;  Location: [facility];  Service: Orthopedics;  Laterality: Right;  . TUBAL LIGATION     Allergies  Allergen Reactions  . Lisinopril Cough  . Oxycodone Other (See Comments)    Hallucinations   Prior to Admission medications    Medication Sig Start Date End Date Taking? Authorizing Provider  amLODipine (NORVASC) 2.5 MG tablet Take 1 tablet (2.5 mg total) by mouth daily. 03/14/20  Yes Angela Guys, MD  cetirizine-pseudoephedrine (ZYRTEC-D) 5-120 MG tablet Take 1 tablet by mouth daily as needed for allergies.   Yes [provider]  famotidine (PEPCID) 20 MG tablet Take 1 tablet (20 mg total) by mouth 2 (two) times daily. 04/05/20  Yes Angela Coss, NP  Olopatadine HCl 0.2 % SOLN Apply 1 drop to eye daily. 07/21/19  Yes Angela Guys, MD  omeprazole (PRILOSEC) 20 MG capsule Take 1 capsule (20 mg total) by mouth daily as needed (reflux/heartburn). 03/14/20  Yes Angela Guys, MD  oxybutynin (DITROPAN) 5 MG tablet Take 1 tablet (5 mg total) by mouth 2 (two) times daily. 03/14/20  Yes Angela Guys, MD  potassium chloride SA (KLOR-CON M20) 20 MEQ tablet Take 1 tablet (20 mEq total) by mouth 2 (two) times daily. 03/14/20  Yes Angela Guys, MD  pravastatin (PRAVACHOL) 20 MG tablet Take 1 tablet (20 mg total) by mouth every evening. 03/14/20  Yes Angela Guys, MD  telmisartan-hydrochlorothiazide (MICARDIS HCT) 80-25 MG tablet Take 1 tablet by mouth daily. 03/14/20  Yes Angela Guys, MD  triamcinolone cream (KENALOG) 0.1 % Apply 1 application topically 2 (two) times daily. 03/13/18  Yes Angela Guys, MD   Social History   Socioeconomic History  . Marital status: Married    Spouse name: Not on file  . Number of children: 1  . Years of education: 12th grade +  . Highest education level: Not on file  Occupational History  . Occupation: Beautician  Tobacco Use  . Smoking status: Never Smoker  . Smokeless tobacco: Never Used  Vaping Use  . Vaping Use: Never used  Substance and Sexual Activity  . Alcohol use: No    Alcohol/week: 0.0 standard drinks  . Drug use: No  . Sexual activity: Not Currently    Partners: Male    Birth control/protection: Post-menopausal  Other Topics Concern  . Not  on file  Social History Narrative   Lives with her husband.   Adult daughter lives nearby.   Social Determinants of Health   Financial Resource Strain:   . Difficulty of Paying Living Expenses: Not on file  Food Insecurity:   . Worried About Charity fundraiser in the Last Year: Not on file  . Ran Out of Food in the Last Year: Not on file  Transportation Needs:   . Lack of Transportation (Medical): Not on file  . Lack of Transportation (Non-Medical): Not on file  Physical Activity:   . Days of Exercise per Week: Not on file  . Minutes of Exercise per Session: Not on file  Stress:   . Feeling of Stress : Not on file  Social Connections:   . Frequency of Communication with Friends and Family: Not on file  .  Frequency of Social Gatherings with Friends and Family: Not on file  . Attends Religious Services: Not on file  . Active Member of Clubs or Organizations: Not on file  . Attends Archivist Meetings: Not on file  . Marital Status: Not on file  Intimate Partner Violence:   . Fear of Current or Ex-Partner: Not on file  . Emotionally Abused: Not on file  . Physically Abused: Not on file  . Sexually Abused: Not on file    Review of Systems Per HPI.   Objective:   Vitals:   05/02/20 1121 05/02/20 1123 05/02/20 1217  BP: (!) 157/77 (!) 144/76 140/70  Pulse: 83    Temp: 97.9 F (36.6 C)    TempSrc: Temporal    SpO2: 99%    Weight: 227 lb (103 kg)    Height: 5\' 2"  (1.575 m)      Physical Exam Vitals reviewed.  Constitutional:      Appearance: She is well-developed.  HENT:     Head: Normocephalic and atraumatic.  Eyes:     Conjunctiva/sclera: Conjunctivae normal.     Pupils: Pupils are equal, round, and reactive to light.  Neck:     Vascular: No carotid bruit.     Comments: No JVD  Cardiovascular:     Rate and Rhythm: Normal rate and regular rhythm.     Heart sounds: Normal heart sounds. No gallop.   Pulmonary:     Effort: Pulmonary effort is  normal.     Breath sounds: Normal breath sounds. No wheezing or rales.  Abdominal:     Palpations: Abdomen is soft. There is no pulsatile mass.     Tenderness: There is no abdominal tenderness.  Musculoskeletal:     Comments: Antalgic, uses cane, brace on left ankle.   Skin:    General: Skin is warm and dry.  Neurological:     Mental Status: She is alert and oriented to person, place, and time.  Psychiatric:        Behavior: Behavior normal.     DG Chest 2 View  Result Date: 05/02/2020 CLINICAL DATA:  Preop EXAM: CHEST - 2 VIEW COMPARISON:  09/20/2017 FINDINGS: Heart is normal size. Minimal left basilar opacity, likely atelectasis. Right lung clear. No effusions or acute bony abnormality. IMPRESSION: Left basilar/lingular atelectasis. Electronically Signed   By: Rolm Baptise M.D.   On: 05/02/2020 16:28     EKG, sinus rhythm, rate 79.  No apparent acute ST/T wave changes. No apparent significant changes when compared to 09/10/2017 EKG.  Results for orders placed or performed in visit on 05/02/20  CBC  Result Value Ref Range   WBC 7.1 3.4 - 10.8 x10E3/uL   RBC 4.90 3.77 - 5.28 x10E6/uL   Hemoglobin 14.8 11.1 - 15.9 g/dL   Hematocrit 43.4 34.0 - 46.6 %   MCV 89 79 - 97 fL   MCH 30.2 26.6 - 33.0 pg   MCHC 34.1 31 - 35 g/dL   RDW 12.7 11.7 - 15.4 %   Platelets 286 150 - 450 x10E3/uL  Comprehensive metabolic panel  Result Value Ref Range   Glucose 106 (H) 65 - 99 mg/dL   BUN 12 8 - 27 mg/dL   Creatinine, Ser 0.84 0.57 - 1.00 mg/dL   GFR calc non Af Amer 72 >59 mL/min/1.73   GFR calc Af Amer 83 >59 mL/min/1.73   BUN/Creatinine Ratio 14 12 - 28   Sodium 141 134 - 144 mmol/L   Potassium  3.9 3.5 - 5.2 mmol/L   Chloride 103 96 - 106 mmol/L   CO2 25 20 - 29 mmol/L   Calcium 9.3 8.7 - 10.3 mg/dL   Total Protein 7.4 6.0 - 8.5 g/dL   Albumin 4.4 3.8 - 4.8 g/dL   Globulin, Total 3.0 1.5 - 4.5 g/dL   Albumin/Globulin Ratio 1.5 1.2 - 2.2   Bilirubin Total 0.7 0.0 - 1.2 mg/dL    Alkaline Phosphatase 78 44 - 121 IU/L   AST 20 0 - 40 IU/L   ALT 16 0 - 32 IU/L  Protime-INR  Result Value Ref Range   INR 1.0 0.9 - 1.2   Prothrombin Time 10.3 9.1 - 12.0 sec  POCT urinalysis dipstick  Result Value Ref Range   Color, UA yellow yellow   Clarity, UA clear clear   Glucose, UA negative negative mg/dL   Bilirubin, UA negative negative   Ketones, POC UA negative negative mg/dL   Spec Grav, UA 1.025 1.010 - 1.025   Blood, UA negative negative   pH, UA 5.0 5.0 - 8.0   Protein Ur, POC negative negative mg/dL   Urobilinogen, UA 0.2 0.2 or 1.0 E.U./dL   Nitrite, UA Negative Negative   Leukocytes, UA Negative Negative    Assessment & Plan:  Angela Estrada is a 69 y.o. female . Pre-operative cardiovascular examination - Plan: EKG 12-Lead, DG Chest 2 View, POCT urinalysis dipstick, CBC, Comprehensive metabolic panel, Protime-INR  Preop examination - Plan: POCT urinalysis dipstick, CBC, Comprehensive metabolic panel, Protime-INR  RCRI/Angela Estrada risk score 0.  Appears to obtain 4-6 metabolic equivalents of activity without chest pain/dyspnea.  No apparent complications or increased risk of major adverse cardiac event by history/exam.  Chest x-ray with probable atelectasis, but recommend repeat prior to surgery for stability. Labs overall reassuring.She appears to be at acceptable level of risk for upcoming surgery.  No orders of the defined types were placed in this encounter.  Patient Instructions    There were no apparent contraindications to your surgery based on history and your exam today.  I will review the results of your lab work and x-ray and then can complete paperwork for your surgeon if those look okay.  Please let me know if there are questions and good luck.   If you have lab work done today you will be contacted with your lab results within the next 2 weeks.  If you have not heard from [place] then please contact us. The fastest way to get your results is to  register for My Chart.   IF you received an x-ray today, you will receive an invoice from [facility]. Please contact [facility] at [phone] with questions or concerns regarding your invoice.   IF you received labwork today, you will receive an invoice from [facility]. Please contact [facility] at [phone] with questions or concerns regarding your invoice.   Our billing staff will not be able to assist you with questions regarding bills from these companies.  You will be contacted with the lab results as soon as they are available. The fastest way to get your results is to activate your My Chart account. Instructions are located on the last page of this paperwork. If you have not heard from [place] regarding the results in 2 weeks, please contact this office.        If you have lab work done today you will be contacted with your lab results within the next 2 weeks.  If you have  not heard from [place] then please contact us. The fastest way to get your results is to register for My Chart.   IF you received an x-ray today, you will receive an invoice from [facility]. Please contact [facility] at [phone] with questions or concerns regarding your invoice.   IF you received labwork today, you will receive an invoice from [place]. Please contact [facility] at [phone] with questions or concerns regarding your invoice.   Our billing staff will not be able to assist you with questions regarding bills from these companies.  You will be contacted with the lab results as soon as they are available. The fastest way to get your results is to activate your My Chart account. Instructions are located on the last page of this paperwork. If you have not heard from [place] regarding the results in 2 weeks, please contact this office.         Signed, Merri Ray, MD [facility]

## 2020-05-02 NOTE — Patient Instructions (Addendum)
  There were no apparent contraindications to your surgery based on history and your exam today.  I will review the results of your lab work and x-ray and then can complete paperwork for your surgeon if those look okay.  Please let me know if there are questions and good luck.   If you have lab work done today you will be contacted with your lab results within the next 2 weeks.  If you have not heard from [place] then please contact us. The fastest way to get your results is to register for My Chart.   IF you received an x-ray today, you will receive an invoice from [facility]. Please contact [facility] at [phone] with questions or concerns regarding your invoice.   IF you received labwork today, you will receive an invoice from [place]. Please contact [facility] at [phone] with questions or concerns regarding your invoice.   Our billing staff will not be able to assist you with questions regarding bills from these companies.  You will be contacted with the lab results as soon as they are available. The fastest way to get your results is to activate your My Chart account. Instructions are located on the last page of this paperwork. If you have not heard from [place] regarding the results in 2 weeks, please contact this office.        If you have lab work done today you will be contacted with your lab results within the next 2 weeks.  If you have not heard from [place] then please contact us. The fastest way to get your results is to register for My Chart.   IF you received an x-ray today, you will receive an invoice from [facility]. Please contact [facility] at [phone] with questions or concerns regarding your invoice.   IF you received labwork today, you will receive an invoice from [place]. Please contact [facility] at [phone] with questions or concerns regarding your invoice.   Our billing staff will not be able to assist you with questions  regarding bills from these companies.  You will be contacted with the lab results as soon as they are available. The fastest way to get your results is to activate your My Chart account. Instructions are located on the last page of this paperwork. If you have not heard from [place] regarding the results in 2 weeks, please contact this office.

## 2020-05-03 LAB — COMPREHENSIVE METABOLIC PANEL
ALT: 16 IU/L (ref 0–32)
AST: 20 IU/L (ref 0–40)
Albumin/Globulin Ratio: 1.5 (ref 1.2–2.2)
Albumin: 4.4 g/dL (ref 3.8–4.8)
Alkaline Phosphatase: 78 IU/L (ref 44–121)
BUN/Creatinine Ratio: 14 (ref 12–28)
BUN: 12 mg/dL (ref 8–27)
Bilirubin Total: 0.7 mg/dL (ref 0.0–1.2)
CO2: 25 mmol/L (ref 20–29)
Calcium: 9.3 mg/dL (ref 8.7–10.3)
Chloride: 103 mmol/L (ref 96–106)
Creatinine, Ser: 0.84 mg/dL (ref 0.57–1.00)
GFR calc Af Amer: 83 mL/min/{1.73_m2} (ref >59)
GFR calc non Af Amer: 72 mL/min/{1.73_m2} (ref >59)
Globulin, Total: 3 g/dL (ref 1.5–4.5)
Glucose: 106 mg/dL — ABNORMAL HIGH (ref 65–99)
Potassium: 3.9 mmol/L (ref 3.5–5.2)
Sodium: 141 mmol/L (ref 134–144)
Total Protein: 7.4 g/dL (ref 6.0–8.5)

## 2020-05-03 LAB — CBC
Hematocrit: 43.4 % (ref 34.0–46.6)
Hemoglobin: 14.8 g/dL (ref 11.1–15.9)
MCH: 30.2 pg (ref 26.6–33.0)
MCHC: 34.1 g/dL (ref 31.5–35.7)
MCV: 89 fL (ref 79–97)
Platelets: 286 10*3/uL (ref 150–450)
RBC: 4.9 x10E6/uL (ref 3.77–5.28)
RDW: 12.7 % (ref 11.7–15.4)
WBC: 7.1 10*3/uL (ref 3.4–10.8)

## 2020-05-03 LAB — PROTIME-INR
INR: 1 (ref 0.9–1.2)
Prothrombin Time: 10.3 s (ref 9.1–12.0)

## 2020-05-10 ENCOUNTER — Telehealth: Payer: Self-pay | Admitting: Family Medicine

## 2020-05-10 NOTE — Telephone Encounter (Signed)
Wells Guiles needing to follow up on recent surgical clearance paperwork /   Cassie Freer at [phone] / fax number [phone]

## 2020-05-11 NOTE — Telephone Encounter (Signed)
Dr. Carlota Raspberry,  Do you have the surgical clearance paperwork for this pt.   She was last seen for this on 05/02/20 and on the pt instructions it stated:  "There were no apparent contraindications to your surgery based on history and your exam today.  I will review the results of your lab work and x-ray and then can complete paperwork for your surgeon if those look okay.  Please let me know if there are questions and good luck."    I have also called [facility] back to see if they are able to send [place] a black copy of the paperwork just in case the provider doesn't have it.  I have left a voicemail for miss Wells Guiles.

## 2020-05-12 NOTE — Telephone Encounter (Signed)
Preop paperwork completed.  Did have probable atelectasis on her previous chest x-ray, but recommended repeat x-ray prior to surgery.  This can be done here or preop through her surgeon.  Let me know and I can place an order.  We will have paperwork sent today.

## 2020-05-13 ENCOUNTER — Ambulatory Visit: Payer: Medicare HMO

## 2020-05-13 ENCOUNTER — Other Ambulatory Visit: Payer: Self-pay

## 2020-05-13 ENCOUNTER — Ambulatory Visit (INDEPENDENT_AMBULATORY_CARE_PROVIDER_SITE_OTHER): Payer: Medicare HMO

## 2020-05-13 DIAGNOSIS — R0602 Shortness of breath: Secondary | ICD-10-CM | POA: Diagnosis not present

## 2020-05-13 DIAGNOSIS — Z01810 Encounter for preprocedural cardiovascular examination: Secondary | ICD-10-CM

## 2020-05-13 DIAGNOSIS — Z01818 Encounter for other preprocedural examination: Secondary | ICD-10-CM

## 2020-05-13 NOTE — Telephone Encounter (Signed)
Left a msg on Angela Estrada from Surgical clearance for [facility] machine. That the surgical clearance paperwork has been filled out and sent back to their office and Dr Carlota Raspberry do recommend patient to have a repeat xray prior to her surgery. Give [place] a call to let us know if we need to place an order for this xray or will they be placing an order.

## 2020-05-14 ENCOUNTER — Ambulatory Visit: Payer: Medicare HMO | Attending: Internal Medicine

## 2020-05-14 DIAGNOSIS — Z23 Encounter for immunization: Secondary | ICD-10-CM

## 2020-05-14 NOTE — Progress Notes (Signed)
   Covid-19 Vaccination Clinic  Name:  Angela Estrada    MRN: [id number] DOB: [birth year]  05/14/2020  [name] was observed post Covid-19 immunization for 15 minutes without incident. She was provided with Vaccine Information Sheet and instruction to access the V-Safe system.   [name] was instructed to call 911 with any severe reactions post vaccine: Marland Kitchen Difficulty breathing  . Swelling of face and throat  . A fast heartbeat  . A bad rash all over body  . Dizziness and weakness   Immunizations Administered    Name Date Dose VIS Date Route   Pfizer COVID-19 Vaccine 05/14/2020 10:45 AM 0.3 mL 03/30/2020 Intramuscular   Manufacturer: [place]   Lot: [id number]   NDC: [address]-1

## 2020-05-16 ENCOUNTER — Telehealth: Payer: Self-pay | Admitting: Family Medicine

## 2020-05-16 NOTE — Telephone Encounter (Signed)
Wells Guiles with [facility] calling looking to follow up on Surgical Clearance paperwork for this patient /    Please reach out to Wells Guiles with status of this paperwork   Phone number [phone]  Fax [phone] or [phone]

## 2020-05-16 NOTE — Telephone Encounter (Signed)
Paper work has been completed and faxed. Copies of paper work has been made and sent to be scanned into pt's chart.

## 2020-05-16 NOTE — Telephone Encounter (Signed)
This forms looks like it was taken care of by [facility]

## 2020-05-16 NOTE — Telephone Encounter (Signed)
Patient was seen last Friday for chest x-ray/ clinical will fax paperwork over again

## 2020-05-16 NOTE — H&P (Signed)
TOTAL HIP REVISION ADMISSION H&P  Patient is admitted for right revision total hip arthroplasty.  Subjective:  Chief Complaint: right hip pain  HPI: Angela Estrada, 69 y.o. female, has a history of pain and functional disability in the right hip due to aseptic loosening of acetabular component and patient has failed non-surgical conservative treatments for greater than 12 weeks to include NSAID's and/or analgesics, use of assistive devices, weight reduction as appropriate and activity modification. The indications for the revision total hip arthroplasty are loosening of one or more components.  Onset of symptoms was gradual starting 1 years ago with gradually worsening course since that time.  Prior procedures on the right hip include arthroplasty.  Patient currently rates pain in the right hip at 10 out of 10 with activity.  There is night pain, worsening of pain with activity and weight bearing, trendelenberg gait, pain that interfers with activities of daily living and pain with passive range of motion. Patient has evidence of prosthetic loosening by imaging studies.  This condition presents safety issues increasing the risk of falls.    There is no current active infection.  Patient Active Problem List   Diagnosis Date Noted  . Prediabetes 07/21/2019  . 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 Arthritis of right hip 03/19/2016  . Primary osteoarthritis of right hip 03/18/2016  . Hypokalemia 03/29/2014  . Arthralgia 02/11/2014  . Routine general medical examination at a health care facility 03/28/2013  . Nonspecific abnormal findings on radiological and examination of lung field 03/27/2013  . Cough 03/11/2013  . Unspecified disorder of liver 03/09/2013  . Pyuria 03/09/2013  . Arthritis   . Ventral hernia 01/18/2012  . Encounter for long-term (current) use of other medications 07/05/2011  . HYPERGLYCEMIA  08/12/2008  . HYPERCHOLESTEROLEMIA 04/25/2007  . DIZZINESS AND GIDDINESS 04/25/2007  . Essential hypertension 01/03/2007  . ALLERGIC RHINITIS 01/03/2007   Past Medical History:  Diagnosis Date  . Abdominal hernia    RLQ  . Allergy   . Arthritis   . Arthritis   . HYPERCHOLESTEROLEMIA 04/25/2007   takes Pravastatin daily  . HYPERTENSION 01/03/2007   takes Losartan-HCTZ  and Amlodipine daily  . Hypokalemia    takes Potassium daily  . Joint pain   . Nocturia   . Pneumonia 2 yrs ago  . Primary osteoarthritis of shoulder    Right    Past Surgical History:  Procedure Laterality Date  . CESAREAN SECTION  25 years ago  . CHOLECYSTECTOMY    . COLONOSCOPY    . DILATION AND CURETTAGE OF UTERUS  35 years ago  . HERNIA REPAIR    . INCISIONAL HERNIA REPAIR  02/21/2012   Procedure: LAPAROSCOPIC INCISIONAL HERNIA;  Surgeon: Harl Bowie, MD;  Location: [facility];  Service: General;  Laterality: N/A;  laparoscopic incisional hernia repair with mesh  . JOINT REPLACEMENT     bilateral knee replacements  . REPLACEMENT TOTAL KNEE BILATERAL  2010  . TOTAL HIP ARTHROPLASTY Right 03/19/2016   Procedure: RIGHT TOTAL HIP ARTHROPLASTY ANTERIOR APPROACH;  Surgeon: Frederik Pear, MD;  Location: [facility];  Service: Orthopedics;  Laterality: Right;  . TOTAL HIP REVISION Right 10/02/2017   Procedure: TOTAL HIP REVISION POSTERIOR;  Surgeon: Frederik Pear, MD;  Location: [facility];  Service: Orthopedics;  Laterality: Right;  . TOTAL SHOULDER ARTHROPLASTY Right 12/19/2017   Procedure: RIGHT TOTAL SHOULDER ARTHROPLASTY;  Surgeon: Tania Ade, MD;  Location: [facility];  Service: Orthopedics;  Laterality: Right;  . TUBAL LIGATION      No current facility-administered medications for this encounter.   Current Outpatient Medications  Medication Sig Dispense Refill Last Dose  . amLODipine (NORVASC) 2.5 MG tablet Take 1 tablet (2.5 mg total) by mouth daily. 90 tablet 1   . Apoaequorin (PREVAGEN) 10 MG CAPS Take 10 mg by  mouth daily.     Marland Kitchen aspirin EC 81 MG tablet Take 81 mg by mouth daily. Swallow whole.     . Carboxymethylcellul-Glycerin (LUBRICATING EYE DROPS OP) Place 1 drop into both eyes daily as needed (dry eyes).     . cetirizine-pseudoephedrine (ZYRTEC-D) 5-120 MG tablet Take 1 tablet by mouth daily as needed for allergies.     . famotidine (PEPCID) 20 MG tablet Take 1 tablet (20 mg total) by mouth 2 (two) times daily. 180 tablet 0   . Multiple Vitamin (MULTIVITAMIN WITH MINERALS) TABS tablet Take 1 tablet by mouth daily.     Marland Kitchen oxybutynin (DITROPAN) 5 MG tablet Take 1 tablet (5 mg total) by mouth 2 (two) times daily. 180 tablet 1   . potassium chloride SA (KLOR-CON M20) 20 MEQ tablet Take 1 tablet (20 mEq total) by mouth 2 (two) times daily. 180 tablet 1   . pravastatin (PRAVACHOL) 20 MG tablet Take 1 tablet (20 mg total) by mouth every evening. 90 tablet 3   . telmisartan-hydrochlorothiazide (MICARDIS HCT) 80-25 MG tablet Take 1 tablet by mouth daily. 90 tablet 1   . Olopatadine HCl 0.2 % SOLN Apply 1 drop to eye daily. (Patient not taking: Reported on 05/13/2020) 2.5 mL 5 Not Taking at Unknown time  . omeprazole (PRILOSEC) 20 MG capsule Take 1 capsule (20 mg total) by mouth daily as needed (reflux/heartburn). (Patient not taking: Reported on 05/13/2020) 30 capsule 0 Not Taking at Unknown time  . triamcinolone cream (KENALOG) 0.1 % Apply 1 application topically 2 (two) times daily. (Patient not taking: Reported on 05/13/2020) 30 g 0 Not Taking at Unknown time   Allergies  Allergen Reactions  . Lisinopril Cough  . Oxycodone Other (See Comments)    Hallucinations    Social History   Tobacco Use  . Smoking status: Never Smoker  . Smokeless tobacco: Never Used  Substance Use Topics  . Alcohol use: No    Alcohol/week: 0.0 standard drinks    Family History  Problem Relation Age of Onset  . Diabetes Mother   . Heart disease Mother   . Hypertension Mother   . Stroke Mother   . Diabetes Father   .  Heart disease Father        Triple Bypass  . Hypertension Father   . Heart attack Brother   . Congestive Heart Failure Brother   . Hypertension Brother   . Heart disease Brother   . Hypertension Sister   . Hypertension Sister   . Cancer Sister        uterine  . Hypertension Sister   . Hypertension Sister   . Hypertension Sister   . Hypertension Sister   . Cancer Sister        uterine  . Colon cancer Neg Hx       Review of Systems  Constitutional: Negative.   HENT: Positive for sinus pain.   Eyes: Negative.   Respiratory: Negative.   Cardiovascular: Positive for leg swelling.       Htn  Endocrine: Negative.   Genitourinary: Negative.   Musculoskeletal: Positive for arthralgias and  myalgias.  Allergic/Immunologic: Negative.   Neurological: Negative.   Hematological: Negative.   Psychiatric/Behavioral: Negative.     Objective:  Physical Exam Constitutional:      Appearance: Normal appearance. She is obese.  HENT:     Head: Normocephalic and atraumatic.     Nose: Nose normal.  Eyes:     Pupils: Pupils are equal, round, and reactive to light.  Cardiovascular:     Pulses: Normal pulses.  Pulmonary:     Effort: Pulmonary effort is normal.  Musculoskeletal:        General: Tenderness present.     Cervical back: Normal range of motion and neck supple.     Comments: Patient walks with a right-sided limp internal rotation of the right hip causes significant pain.  Both the anterior and posterior drill hip scars are well-healed.    Skin:    General: Skin is warm and dry.  Neurological:     General: No focal deficit present.     Mental Status: She is alert and oriented to person, place, and time. Mental status is at baseline.  Psychiatric:        Mood and Affect: Mood normal.        Behavior: Behavior normal.        Thought Content: Thought content normal.        Judgment: Judgment normal.     Vital signs in last 24 hours:     Labs:   Estimated body mass  index is 41.52 kg/m as calculated from the following:   Height as of 05/02/20: 5\' 2"  (1.575 m).   Weight as of 05/02/20: 103 kg.  Imaging Review:   CT scan images reviewed with the patient and do show loosening and verticality of the Pinnacle shell that was not present in 2019 when she had a revision of the total hip stem.     Assessment/Plan:  End stage arthritis, right hip(s) with failed previous arthroplasty.  The patient history, physical examination, clinical judgement of the provider and imaging studies are consistent with end stage degenerative joint disease of the right hip(s), previous total hip arthroplasty. Revision total hip arthroplasty is deemed medically necessary. The treatment options including medical management, injection therapy, arthroscopy and arthroplasty were discussed at length. The risks and benefits of total hip arthroplasty were presented and reviewed. The risks due to aseptic loosening, infection, stiffness, dislocation/subluxation,  thromboembolic complications and other imponderables were discussed.  The patient acknowledged the explanation, agreed to proceed with the plan and consent was signed. Patient is being admitted for inpatient treatment for surgery, pain control, PT, OT, prophylactic antibiotics, VTE prophylaxis, progressive ambulation and ADL's and discharge planning. The patient is planning to be discharged home with home health services

## 2020-05-17 NOTE — Progress Notes (Signed)
DUE TO COVID-19 ONLY ONE VISITOR IS ALLOWED TO COME WITH YOU AND STAY IN THE WAITING ROOM ONLY DURING PRE OP AND PROCEDURE DAY OF SURGERY. THE 1 VISITOR  MAY VISIT WITH YOU AFTER SURGERY IN YOUR PRIVATE ROOM DURING VISITING HOURS ONLY!  YOU NEED TO HAVE A COVID 19 TEST ON_12/02/2020 ______ @_______ , THIS TEST MUST BE DONE BEFORE SURGERY,  COVID TESTING SITE [address] [place] [address], IT IS ON THE RIGHT GOING OUT [address] APPROXIMATELY  2 MINUTES PAST [employer] ON THE RIGHT. ONCE YOUR COVID TEST IS COMPLETED,  PLEASE BEGIN THE QUARANTINE INSTRUCTIONS AS OUTLINED IN YOUR HANDOUT.                Angela Estrada  05/17/2020   Your procedure is scheduled on: 05/23/2020    Report to [facility] Main  Entrance   Report to admitting at   [place] AM     Call this number if you have problems the morning of surgery [phone]    REMEMBER: NO  SOLID FOOD CANDY OR GUM AFTER MIDNIGHT. CLEAR LIQUIDS UNTIL 0745am         . NOTHING BY MOUTH EXCEPT CLEAR LIQUIDS UNTIL    . PLEASE FINISH  G2 DRINK PER SURGEON ORDER  WHICH NEEDS TO BE COMPLETED AT  0745am     .      CLEAR LIQUID DIET   Foods Allowed                                                                    Coffee and tea, regular and decaf                            Fruit ices (not with fruit pulp)                                      Iced Popsicles                                    Carbonated beverages, regular and diet                                    Cranberry, grape and apple juices Sports drinks like Gatorade Lightly seasoned clear broth or consume(fat free) Sugar, honey syrup ___________________________________________________________________      BRUSH YOUR TEETH MORNING OF SURGERY AND RINSE YOUR MOUTH OUT, NO CHEWING GUM CANDY OR MINTS.     Take these medicines the morning of surgery with A SIP OF WATER:  Amldsipine, pepcid, ditropan  DO NOT TAKE ANY DIABETIC MEDICATIONS DAY OF  YOUR SURGERY                               You may not have any metal on your body including hair pins and              piercings  Do not wear jewelry, make-up, lotions, powders or perfumes, deodorant             Do not wear nail polish on your fingernails.  Do not shave  48 hours prior to surgery.              Men may shave face and neck.   Do not bring valuables to the hospital. [place].  Contacts, dentures or bridgework may not be worn into surgery.  Leave suitcase in the car. After surgery it may be brought to your room.     Patients discharged the day of surgery will not be allowed to drive home. IF YOU ARE HAVING SURGERY AND GOING HOME THE SAME DAY, YOU MUST HAVE AN ADULT TO DRIVE YOU HOME AND BE WITH YOU FOR 24 HOURS. YOU MAY GO HOME BY TAXI OR UBER OR ORTHERWISE, BUT AN ADULT MUST ACCOMPANY YOU HOME AND STAY WITH YOU FOR 24 HOURS.  Name and phone number of your driver:  Special Instructions: N/A              Please read over the following fact sheets you were given: _____________________________________________________________________  [facility] - Preparing for Surgery Before surgery, you can play an important role.  Because skin is not sterile, your skin needs to be as free of germs as possible.  You can reduce the number of germs on your skin by washing with CHG (chlorahexidine gluconate) soap before surgery.  CHG is an antiseptic cleaner which kills germs and bonds with the skin to continue killing germs even after washing. Please DO NOT use if you have an allergy to CHG or antibacterial soaps.  If your skin becomes reddened/irritated stop using the CHG and inform your nurse when you arrive at Short Stay. Do not shave (including legs and underarms) for at least 48 hours prior to the first CHG shower.  You may shave your face/neck. Please follow these instructions carefully:  1.  Shower with CHG Soap the night before surgery and  the  morning of Surgery.  2.  If you choose to wash your hair, wash your hair first as usual with your  normal  shampoo.  3.  After you shampoo, rinse your hair and body thoroughly to remove the  shampoo.                           4.  Use CHG as you would any other liquid soap.  You can apply chg directly  to the skin and wash                       Gently with a scrungie or clean washcloth.  5.  Apply the CHG Soap to your body ONLY FROM THE NECK DOWN.   Do not use on face/ open                           Wound or open sores. Avoid contact with eyes, ears mouth and genitals (private parts).                       Wash face,  Genitals (private parts) with your normal soap.             6.  Wash  thoroughly, paying special attention to the area where your surgery  will be performed.  7.  Thoroughly rinse your body with warm water from the neck down.  8.  DO NOT shower/wash with your normal soap after using and rinsing off  the CHG Soap.                9.  Pat yourself dry with a clean towel.            10.  Wear clean pajamas.            11.  Place clean sheets on your bed the night of your first shower and do not  sleep with pets. Day of Surgery : Do not apply any lotions/deodorants the morning of surgery.  Please wear clean clothes to the hospital/surgery center.  FAILURE TO FOLLOW THESE INSTRUCTIONS MAY RESULT IN THE CANCELLATION OF YOUR SURGERY PATIENT SIGNATURE_________________________________  NURSE SIGNATURE__________________________________  ________________________________________________________________________

## 2020-05-19 ENCOUNTER — Other Ambulatory Visit (HOSPITAL_COMMUNITY)
Admission: RE | Admit: 2020-05-19 | Discharge: 2020-05-19 | Disposition: A | Discharge status: Home / Self Care | Payer: Medicare HMO | Source: Ambulatory Visit | Attending: Orthopedic Surgery | Admitting: Orthopedic Surgery

## 2020-05-19 ENCOUNTER — Encounter (HOSPITAL_COMMUNITY): Payer: Self-pay

## 2020-05-19 ENCOUNTER — Other Ambulatory Visit: Payer: Self-pay

## 2020-05-19 ENCOUNTER — Encounter (HOSPITAL_COMMUNITY)
Admission: RE | Admit: 2020-05-19 | Discharge: 2020-05-19 | Disposition: A | Discharge status: Home / Self Care | Payer: Medicare HMO | Source: Ambulatory Visit | Attending: Orthopedic Surgery | Admitting: Orthopedic Surgery

## 2020-05-19 DIAGNOSIS — R7303 Prediabetes: Secondary | ICD-10-CM | POA: Diagnosis not present

## 2020-05-19 DIAGNOSIS — Z01812 Encounter for preprocedural laboratory examination: Secondary | ICD-10-CM | POA: Insufficient documentation

## 2020-05-19 DIAGNOSIS — Z20822 Contact with and (suspected) exposure to covid-19: Secondary | ICD-10-CM | POA: Insufficient documentation

## 2020-05-19 HISTORY — DX: Prediabetes: R73.03

## 2020-05-19 HISTORY — DX: Gastro-esophageal reflux disease without esophagitis: K21.9

## 2020-05-19 LAB — BASIC METABOLIC PANEL
Anion gap: 9 (ref 5–15)
BUN: 11 mg/dL (ref 8–23)
CO2: 27 mmol/L (ref 22–32)
Calcium: 9.8 mg/dL (ref 8.9–10.3)
Chloride: 102 mmol/L (ref 98–111)
Creatinine, Ser: 0.62 mg/dL (ref 0.44–1.00)
GFR, Estimated: >60 mL/min (ref >60)
Glucose, Bld: 96 mg/dL (ref 70–99)
Potassium: 3.7 mmol/L (ref 3.5–5.1)
Sodium: 138 mmol/L (ref 135–145)

## 2020-05-19 LAB — PROTIME-INR
INR: 1 (ref 0.8–1.2)
Prothrombin Time: 12.7 seconds (ref 11.4–15.2)

## 2020-05-19 LAB — CBC WITH DIFFERENTIAL/PLATELET
Abs Immature Granulocytes: 0.01 10*3/uL (ref 0.00–0.07)
Basophils Absolute: 0 10*3/uL (ref 0.0–0.1)
Basophils Relative: 1 %
Eosinophils Absolute: 0.2 10*3/uL (ref 0.0–0.5)
Eosinophils Relative: 3 %
HCT: 44.9 % (ref 36.0–46.0)
Hemoglobin: 15.1 g/dL — ABNORMAL HIGH (ref 12.0–15.0)
Immature Granulocytes: 0 %
Lymphocytes Relative: 24 %
Lymphs Abs: 1.7 10*3/uL (ref 0.7–4.0)
MCH: 30.4 pg (ref 26.0–34.0)
MCHC: 33.6 g/dL (ref 30.0–36.0)
MCV: 90.3 fL (ref 80.0–100.0)
Monocytes Absolute: 0.6 10*3/uL (ref 0.1–1.0)
Monocytes Relative: 9 %
Neutro Abs: 4.5 10*3/uL (ref 1.7–7.7)
Neutrophils Relative %: 63 %
Platelets: 266 10*3/uL (ref 150–400)
RBC: 4.97 MIL/uL (ref 3.87–5.11)
RDW: 13.4 % (ref 11.5–15.5)
WBC: 7 10*3/uL (ref 4.0–10.5)
nRBC: 0 % (ref 0.0–0.2)

## 2020-05-19 LAB — URINALYSIS, ROUTINE W REFLEX MICROSCOPIC
Bilirubin Urine: NEGATIVE
Glucose, UA: NEGATIVE mg/dL
Hgb urine dipstick: NEGATIVE
Ketones, ur: NEGATIVE mg/dL
Leukocytes,Ua: NEGATIVE
Nitrite: NEGATIVE
Protein, ur: NEGATIVE mg/dL
Specific Gravity, Urine: 1.008 (ref 1.005–1.030)
pH: 6 (ref 5.0–8.0)

## 2020-05-19 LAB — HEMOGLOBIN A1C
Hgb A1c MFr Bld: 5.9 % — ABNORMAL HIGH (ref 4.8–5.6)
Mean Plasma Glucose: 122.63 mg/dL

## 2020-05-19 LAB — SURGICAL PCR SCREEN
MRSA, PCR: NEGATIVE
Staphylococcus aureus: NEGATIVE

## 2020-05-19 LAB — SARS CORONAVIRUS 2 (TAT 6-24 HRS): SARS Coronavirus 2: NEGATIVE

## 2020-05-19 LAB — APTT: aPTT: 37 seconds — ABNORMAL HIGH (ref 24–36)

## 2020-05-19 LAB — GLUCOSE, CAPILLARY: Glucose-Capillary: 125 mg/dL — ABNORMAL HIGH (ref 70–99)

## 2020-05-19 NOTE — Progress Notes (Signed)
Anesthesia Review:  PCP: DR Merri Ray LOV 05/02/20- preop clearance  Cardiologist : Chest x-ray :05/13/2020  EKG :05/02/2020  Echo : Stress test: Cardiac Cath :  Activity level:  Cannot do a flight of stairs without difficulty  Sleep Study/ CPAP : no  Fasting Blood Sugar :      / Checks Blood Sugar -- times a day:   Blood Thinner/ Instructions /Last Dose: ASA / Instructions/ Last Dose :  DM- type 2

## 2020-05-22 MED ORDER — TRANEXAMIC ACID 1000 MG/10ML IV SOLN
2000.0000 mg | INTRAVENOUS | Status: AC
  Filled 2020-05-22: qty 20

## 2020-05-22 MED ORDER — BUPIVACAINE LIPOSOME 1.3 % IJ SUSP
10.0000 mL | Freq: Once | INTRAMUSCULAR | Status: AC
  Filled 2020-05-22: qty 10

## 2020-05-23 ENCOUNTER — Encounter (HOSPITAL_COMMUNITY)
Admission: RE | Disposition: A | Discharge status: Home / Self Care | Payer: Self-pay | Source: Home / Self Care | Attending: Orthopedic Surgery

## 2020-05-23 ENCOUNTER — Observation Stay (HOSPITAL_COMMUNITY)
Admission: RE | Admit: 2020-05-23 | Discharge: 2020-05-24 | Disposition: A | Discharge status: Home / Self Care | Payer: Medicare HMO | Attending: Orthopedic Surgery | Admitting: Orthopedic Surgery

## 2020-05-23 ENCOUNTER — Ambulatory Visit (HOSPITAL_COMMUNITY): Payer: Medicare HMO | Admitting: Physician Assistant

## 2020-05-23 ENCOUNTER — Ambulatory Visit (HOSPITAL_COMMUNITY): Payer: Medicare HMO | Admitting: Anesthesiology

## 2020-05-23 ENCOUNTER — Encounter (HOSPITAL_COMMUNITY): Payer: Self-pay | Admitting: Orthopedic Surgery

## 2020-05-23 ENCOUNTER — Other Ambulatory Visit: Payer: Self-pay

## 2020-05-23 DIAGNOSIS — M25551 Pain in right hip: Secondary | ICD-10-CM | POA: Diagnosis present

## 2020-05-23 DIAGNOSIS — T84030A Mechanical loosening of internal right hip prosthetic joint, initial encounter: Secondary | ICD-10-CM | POA: Diagnosis not present

## 2020-05-23 DIAGNOSIS — Z96641 Presence of right artificial hip joint: Secondary | ICD-10-CM | POA: Insufficient documentation

## 2020-05-23 DIAGNOSIS — T84090A Other mechanical complication of internal right hip prosthesis, initial encounter: Secondary | ICD-10-CM | POA: Diagnosis not present

## 2020-05-23 DIAGNOSIS — Z7982 Long term (current) use of aspirin: Secondary | ICD-10-CM | POA: Insufficient documentation

## 2020-05-23 DIAGNOSIS — E7800 Pure hypercholesterolemia, unspecified: Secondary | ICD-10-CM | POA: Diagnosis not present

## 2020-05-23 DIAGNOSIS — I10 Essential (primary) hypertension: Secondary | ICD-10-CM | POA: Insufficient documentation

## 2020-05-23 DIAGNOSIS — Z96611 Presence of right artificial shoulder joint: Secondary | ICD-10-CM | POA: Diagnosis not present

## 2020-05-23 DIAGNOSIS — J309 Allergic rhinitis, unspecified: Secondary | ICD-10-CM | POA: Diagnosis not present

## 2020-05-23 DIAGNOSIS — T84018A Broken internal joint prosthesis, other site, initial encounter: Secondary | ICD-10-CM

## 2020-05-23 DIAGNOSIS — Z96653 Presence of artificial knee joint, bilateral: Secondary | ICD-10-CM | POA: Insufficient documentation

## 2020-05-23 DIAGNOSIS — Z79899 Other long term (current) drug therapy: Secondary | ICD-10-CM | POA: Insufficient documentation

## 2020-05-23 DIAGNOSIS — Z96649 Presence of unspecified artificial hip joint: Secondary | ICD-10-CM

## 2020-05-23 DIAGNOSIS — Z01818 Encounter for other preprocedural examination: Secondary | ICD-10-CM

## 2020-05-23 HISTORY — PX: TOTAL HIP REVISION: SHX763

## 2020-05-23 LAB — TYPE AND SCREEN
ABO/RH(D): A POS
Antibody Screen: NEGATIVE

## 2020-05-23 LAB — GLUCOSE, CAPILLARY: Glucose-Capillary: 99 mg/dL (ref 70–99)

## 2020-05-23 SURGERY — TOTAL HIP REVISION
Anesthesia: General | Site: Hip | Laterality: Right

## 2020-05-23 MED ORDER — HYDROMORPHONE HCL 1 MG/ML IJ SOLN: 0.5000 mg | INTRAMUSCULAR | Status: DC | PRN

## 2020-05-23 MED ORDER — DEXAMETHASONE SODIUM PHOSPHATE 10 MG/ML IJ SOLN
INTRAMUSCULAR | Status: DC | PRN
  Administered 2020-05-23: 10 mg via INTRAVENOUS

## 2020-05-23 MED ORDER — HYDROMORPHONE HCL 2 MG/ML IJ SOLN
INTRAMUSCULAR | Status: AC
  Filled 2020-05-23: qty 1

## 2020-05-23 MED ORDER — PHENOL 1.4 % MT LIQD: 1.0000 | OROMUCOSAL | Status: DC | PRN

## 2020-05-23 MED ORDER — MIDAZOLAM HCL 2 MG/2ML IJ SOLN
INTRAMUSCULAR | Status: AC
  Filled 2020-05-23: qty 2

## 2020-05-23 MED ORDER — POLYETHYLENE GLYCOL 3350 17 G PO PACK: 17.0000 g | PACK | Freq: Every day | ORAL | Status: DC | PRN

## 2020-05-23 MED ORDER — DEXMEDETOMIDINE (PRECEDEX) IN NS 20 MCG/5ML (4 MCG/ML) IV SYRINGE
PREFILLED_SYRINGE | INTRAVENOUS | Status: DC | PRN
  Administered 2020-05-23 (×2): 4 ug via INTRAVENOUS

## 2020-05-23 MED ORDER — ASPIRIN 81 MG PO CHEW
81.0000 mg | CHEWABLE_TABLET | Freq: Two times a day (BID) | ORAL | Status: DC
  Administered 2020-05-23 – 2020-05-24 (×2): 81 mg via ORAL
  Filled 2020-05-23 (×2): qty 1

## 2020-05-23 MED ORDER — TIZANIDINE HCL 2 MG PO TABS: 2.0000 mg | ORAL_TABLET | Freq: Four times a day (QID) | ORAL | 0 refills | Status: DC | PRN

## 2020-05-23 MED ORDER — PRAVASTATIN SODIUM 20 MG PO TABS
20.0000 mg | ORAL_TABLET | Freq: Every evening | ORAL | Status: DC
  Administered 2020-05-23: 20 mg via ORAL
  Filled 2020-05-23: qty 1

## 2020-05-23 MED ORDER — HYDROMORPHONE HCL 2 MG PO TABS: 2.0000 mg | ORAL_TABLET | ORAL | 0 refills | Status: DC | PRN

## 2020-05-23 MED ORDER — PHENYLEPHRINE 40 MCG/ML (10ML) SYRINGE FOR IV PUSH (FOR BLOOD PRESSURE SUPPORT)
PREFILLED_SYRINGE | INTRAVENOUS | Status: AC
  Filled 2020-05-23: qty 10

## 2020-05-23 MED ORDER — SODIUM CHLORIDE 0.9% FLUSH
INTRAVENOUS | Status: DC | PRN
  Administered 2020-05-23: 20 mL

## 2020-05-23 MED ORDER — ASPIRIN EC 81 MG PO TBEC: 81.0000 mg | DELAYED_RELEASE_TABLET | Freq: Two times a day (BID) | ORAL | 0 refills | Status: AC

## 2020-05-23 MED ORDER — EPHEDRINE SULFATE-NACL 50-0.9 MG/10ML-% IV SOSY
PREFILLED_SYRINGE | INTRAVENOUS | Status: DC | PRN
  Administered 2020-05-23 (×2): 5 mg via INTRAVENOUS

## 2020-05-23 MED ORDER — LACTATED RINGERS IV SOLN: INTRAVENOUS | Status: DC

## 2020-05-23 MED ORDER — CHLORHEXIDINE GLUCONATE 0.12 % MT SOLN
15.0000 mL | Freq: Once | OROMUCOSAL | Status: AC
  Administered 2020-05-23: 15 mL via OROMUCOSAL

## 2020-05-23 MED ORDER — MENTHOL 3 MG MT LOZG
1.0000 | LOZENGE | OROMUCOSAL | Status: DC | PRN
  Administered 2020-05-23: 3 mg via ORAL
  Filled 2020-05-23 (×2): qty 9

## 2020-05-23 MED ORDER — FENTANYL CITRATE (PF) 100 MCG/2ML IJ SOLN: 25.0000 ug | INTRAMUSCULAR | Status: DC | PRN

## 2020-05-23 MED ORDER — IRBESARTAN 150 MG PO TABS
300.0000 mg | ORAL_TABLET | Freq: Every day | ORAL | Status: DC
  Administered 2020-05-23: 300 mg via ORAL
  Filled 2020-05-23: qty 2

## 2020-05-23 MED ORDER — BUPIVACAINE LIPOSOME 1.3 % IJ SUSP
10.0000 mL | Freq: Once | INTRAMUSCULAR | Status: AC
  Filled 2020-05-23: qty 10

## 2020-05-23 MED ORDER — HYDROMORPHONE HCL 1 MG/ML IJ SOLN
INTRAMUSCULAR | Status: DC | PRN
  Administered 2020-05-23: 1 mg via INTRAVENOUS

## 2020-05-23 MED ORDER — FENTANYL CITRATE (PF) 100 MCG/2ML IJ SOLN
INTRAMUSCULAR | Status: AC
  Filled 2020-05-23: qty 2

## 2020-05-23 MED ORDER — FENTANYL CITRATE (PF) 100 MCG/2ML IJ SOLN
INTRAMUSCULAR | Status: DC | PRN
  Administered 2020-05-23 (×2): 50 ug via INTRAVENOUS
  Administered 2020-05-23: 100 ug via INTRAVENOUS

## 2020-05-23 MED ORDER — FLEET ENEMA 7-19 GM/118ML RE ENEM: 1.0000 | ENEMA | Freq: Once | RECTAL | Status: DC | PRN

## 2020-05-23 MED ORDER — BUPIVACAINE LIPOSOME 1.3 % IJ SUSP
INTRAMUSCULAR | Status: DC | PRN
  Administered 2020-05-23: 20 mL

## 2020-05-23 MED ORDER — PROPOFOL 10 MG/ML IV BOLUS
INTRAVENOUS | Status: DC | PRN
  Administered 2020-05-23: 150 mg via INTRAVENOUS

## 2020-05-23 MED ORDER — SODIUM CHLORIDE 0.9 % IR SOLN
Status: DC | PRN
  Administered 2020-05-23: 3000 mL
  Administered 2020-05-23: 1000 mL

## 2020-05-23 MED ORDER — ROCURONIUM BROMIDE 10 MG/ML (PF) SYRINGE
PREFILLED_SYRINGE | INTRAVENOUS | Status: DC | PRN
  Administered 2020-05-23: 10 mg via INTRAVENOUS
  Administered 2020-05-23: 60 mg via INTRAVENOUS

## 2020-05-23 MED ORDER — METOCLOPRAMIDE HCL 5 MG/ML IJ SOLN: 5.0000 mg | Freq: Three times a day (TID) | INTRAMUSCULAR | Status: DC | PRN

## 2020-05-23 MED ORDER — EPHEDRINE 5 MG/ML INJ
INTRAVENOUS | Status: AC
  Filled 2020-05-23: qty 20

## 2020-05-23 MED ORDER — DIPHENHYDRAMINE HCL 12.5 MG/5ML PO ELIX: 12.5000 mg | ORAL_SOLUTION | ORAL | Status: DC | PRN

## 2020-05-23 MED ORDER — POLYVINYL ALCOHOL 1.4 % OP SOLN: 1.0000 [drp] | OPHTHALMIC | Status: DC | PRN

## 2020-05-23 MED ORDER — APOAEQUORIN 10 MG PO CAPS: 10.0000 mg | ORAL_CAPSULE | Freq: Every day | ORAL | Status: DC

## 2020-05-23 MED ORDER — DOCUSATE SODIUM 100 MG PO CAPS
100.0000 mg | ORAL_CAPSULE | Freq: Two times a day (BID) | ORAL | Status: DC
  Administered 2020-05-23 – 2020-05-24 (×2): 100 mg via ORAL
  Filled 2020-05-23 (×2): qty 1

## 2020-05-23 MED ORDER — POVIDONE-IODINE 10 % EX SWAB
2.0000 "application " | Freq: Once | CUTANEOUS | Status: AC
  Administered 2020-05-23: 2 via TOPICAL

## 2020-05-23 MED ORDER — LIDOCAINE 2% (20 MG/ML) 5 ML SYRINGE
INTRAMUSCULAR | Status: DC | PRN
  Administered 2020-05-23: 50 mg via INTRAVENOUS

## 2020-05-23 MED ORDER — DEXAMETHASONE SODIUM PHOSPHATE 10 MG/ML IJ SOLN: 10.0000 mg | Freq: Once | INTRAMUSCULAR | Status: DC

## 2020-05-23 MED ORDER — BUPIVACAINE-EPINEPHRINE (PF) 0.5% -1:200000 IJ SOLN
INTRAMUSCULAR | Status: AC
  Filled 2020-05-23: qty 30

## 2020-05-23 MED ORDER — MIDAZOLAM HCL 5 MG/5ML IJ SOLN
INTRAMUSCULAR | Status: DC | PRN
  Administered 2020-05-23: 2 mg via INTRAVENOUS

## 2020-05-23 MED ORDER — SUGAMMADEX SODIUM 500 MG/5ML IV SOLN
INTRAVENOUS | Status: AC
  Filled 2020-05-23: qty 5

## 2020-05-23 MED ORDER — TRANEXAMIC ACID-NACL 1000-0.7 MG/100ML-% IV SOLN
1000.0000 mg | INTRAVENOUS | Status: AC
  Administered 2020-05-23: 1000 mg via INTRAVENOUS
  Filled 2020-05-23: qty 100

## 2020-05-23 MED ORDER — PHENYLEPHRINE HCL-NACL 10-0.9 MG/250ML-% IV SOLN
INTRAVENOUS | Status: DC | PRN
  Administered 2020-05-23: 25 ug/min via INTRAVENOUS

## 2020-05-23 MED ORDER — PANTOPRAZOLE SODIUM 40 MG PO TBEC
40.0000 mg | DELAYED_RELEASE_TABLET | Freq: Every day | ORAL | Status: DC
  Administered 2020-05-23 – 2020-05-24 (×2): 40 mg via ORAL
  Filled 2020-05-23 (×3): qty 1

## 2020-05-23 MED ORDER — KCL IN DEXTROSE-NACL 20-5-0.45 MEQ/L-%-% IV SOLN
INTRAVENOUS | Status: DC
  Filled 2020-05-23: qty 1000

## 2020-05-23 MED ORDER — TELMISARTAN-HCTZ 80-25 MG PO TABS: 1.0000 | ORAL_TABLET | Freq: Every day | ORAL | Status: DC

## 2020-05-23 MED ORDER — OXYBUTYNIN CHLORIDE 5 MG PO TABS
5.0000 mg | ORAL_TABLET | Freq: Two times a day (BID) | ORAL | Status: DC
  Administered 2020-05-23 – 2020-05-24 (×2): 5 mg via ORAL
  Filled 2020-05-23 (×2): qty 1

## 2020-05-23 MED ORDER — BUPIVACAINE-EPINEPHRINE (PF) 0.5% -1:200000 IJ SOLN
INTRAMUSCULAR | Status: DC | PRN
  Administered 2020-05-23: 30 mL

## 2020-05-23 MED ORDER — ALBUTEROL SULFATE HFA 108 (90 BASE) MCG/ACT IN AERS
INHALATION_SPRAY | RESPIRATORY_TRACT | Status: AC
  Filled 2020-05-23: qty 6.7

## 2020-05-23 MED ORDER — ONDANSETRON HCL 4 MG/2ML IJ SOLN
INTRAMUSCULAR | Status: DC | PRN
  Administered 2020-05-23: 4 mg via INTRAVENOUS

## 2020-05-23 MED ORDER — SODIUM CHLORIDE (PF) 0.9 % IJ SOLN
INTRAMUSCULAR | Status: AC
  Filled 2020-05-23: qty 50

## 2020-05-23 MED ORDER — TRANEXAMIC ACID-NACL 1000-0.7 MG/100ML-% IV SOLN
1000.0000 mg | Freq: Once | INTRAVENOUS | Status: AC
  Administered 2020-05-23: 1000 mg via INTRAVENOUS
  Filled 2020-05-23: qty 100

## 2020-05-23 MED ORDER — POTASSIUM CHLORIDE CRYS ER 20 MEQ PO TBCR
20.0000 meq | EXTENDED_RELEASE_TABLET | Freq: Two times a day (BID) | ORAL | Status: DC
  Administered 2020-05-23 – 2020-05-24 (×2): 20 meq via ORAL
  Filled 2020-05-23 (×2): qty 1

## 2020-05-23 MED ORDER — PROPOFOL 10 MG/ML IV BOLUS
INTRAVENOUS | Status: AC
  Filled 2020-05-23: qty 20

## 2020-05-23 MED ORDER — TRANEXAMIC ACID 1000 MG/10ML IV SOLN
INTRAVENOUS | Status: DC | PRN
  Administered 2020-05-23: 2000 mg via TOPICAL

## 2020-05-23 MED ORDER — HYDROMORPHONE HCL 2 MG PO TABS
2.0000 mg | ORAL_TABLET | Freq: Four times a day (QID) | ORAL | Status: DC | PRN
  Administered 2020-05-23 – 2020-05-24 (×3): 2 mg via ORAL
  Filled 2020-05-23 (×3): qty 1

## 2020-05-23 MED ORDER — ORAL CARE MOUTH RINSE: 15.0000 mL | Freq: Once | OROMUCOSAL | Status: AC

## 2020-05-23 MED ORDER — LORATADINE 10 MG PO TABS
10.0000 mg | ORAL_TABLET | Freq: Every day | ORAL | Status: DC | PRN
  Administered 2020-05-23: 10 mg via ORAL
  Filled 2020-05-23: qty 1

## 2020-05-23 MED ORDER — SUGAMMADEX SODIUM 500 MG/5ML IV SOLN
INTRAVENOUS | Status: DC | PRN
  Administered 2020-05-23: 300 mg via INTRAVENOUS

## 2020-05-23 MED ORDER — ONDANSETRON HCL 4 MG PO TABS: 4.0000 mg | ORAL_TABLET | Freq: Four times a day (QID) | ORAL | Status: DC | PRN

## 2020-05-23 MED ORDER — PHENYLEPHRINE 40 MCG/ML (10ML) SYRINGE FOR IV PUSH (FOR BLOOD PRESSURE SUPPORT)
PREFILLED_SYRINGE | INTRAVENOUS | Status: DC | PRN
  Administered 2020-05-23 (×5): 80 ug via INTRAVENOUS

## 2020-05-23 MED ORDER — ONDANSETRON HCL 4 MG/2ML IJ SOLN: 4.0000 mg | Freq: Four times a day (QID) | INTRAMUSCULAR | Status: DC | PRN

## 2020-05-23 MED ORDER — METOCLOPRAMIDE HCL 5 MG PO TABS: 5.0000 mg | ORAL_TABLET | Freq: Three times a day (TID) | ORAL | Status: DC | PRN

## 2020-05-23 MED ORDER — HYDROCHLOROTHIAZIDE 25 MG PO TABS
25.0000 mg | ORAL_TABLET | Freq: Every day | ORAL | Status: DC
  Administered 2020-05-23: 25 mg via ORAL
  Filled 2020-05-23: qty 1

## 2020-05-23 MED ORDER — CARBOXYMETHYLCELLUL-GLYCERIN 0.5-0.9 % OP SOLN: 1.0000 [drp] | Freq: Three times a day (TID) | OPHTHALMIC | Status: DC | PRN

## 2020-05-23 MED ORDER — CEFAZOLIN SODIUM-DEXTROSE 2-4 GM/100ML-% IV SOLN
2.0000 g | INTRAVENOUS | Status: AC
  Administered 2020-05-23: 2 g via INTRAVENOUS
  Filled 2020-05-23: qty 100

## 2020-05-23 MED ORDER — ALUM & MAG HYDROXIDE-SIMETH 200-200-20 MG/5ML PO SUSP: 30.0000 mL | ORAL | Status: DC | PRN

## 2020-05-23 MED ORDER — BISACODYL 5 MG PO TBEC: 5.0000 mg | DELAYED_RELEASE_TABLET | Freq: Every day | ORAL | Status: DC | PRN

## 2020-05-23 MED ORDER — DEXMEDETOMIDINE (PRECEDEX) IN NS 20 MCG/5ML (4 MCG/ML) IV SYRINGE
PREFILLED_SYRINGE | INTRAVENOUS | Status: AC
  Filled 2020-05-23: qty 5

## 2020-05-23 MED ORDER — AMLODIPINE BESYLATE 5 MG PO TABS
2.5000 mg | ORAL_TABLET | Freq: Every day | ORAL | Status: DC
  Filled 2020-05-23: qty 1

## 2020-05-23 SURGICAL SUPPLY — 67 items
APL PRP STRL LF DISP 70% ISPRP (MISCELLANEOUS) ×1
BAG DECANTER FOR FLEXI CONT (MISCELLANEOUS) ×2 IMPLANT
BLADE SAW SAG 73X25 THK (BLADE)
BLADE SAW SGTL 73X25 THK (BLADE) IMPLANT
CHLORAPREP W/TINT 26 (MISCELLANEOUS) ×2 IMPLANT
COVER SURGICAL LIGHT HANDLE (MISCELLANEOUS) ×2 IMPLANT
COVER WAND RF STERILE (DRAPES) IMPLANT
CUP SECTOR GRIPTON 58MM (Orthopedic Implant) ×1 IMPLANT
DECANTER SPIKE VIAL GLASS SM (MISCELLANEOUS) ×2 IMPLANT
DRAPE C-ARM 42X120 X-RAY (DRAPES) IMPLANT
DRAPE C-ARMOR (DRAPES) IMPLANT
DRAPE ORTHO SPLIT 77X108 STRL (DRAPES) ×4
DRAPE SHEET LG 3/4 BI-LAMINATE (DRAPES) ×2 IMPLANT
DRAPE SURG ORHT 6 SPLT 77X108 (DRAPES) ×2 IMPLANT
DRAPE U-SHAPE 47X51 STRL (DRAPES) ×2 IMPLANT
DRSG AQUACEL AG ADV 3.5X10 (GAUZE/BANDAGES/DRESSINGS) ×2 IMPLANT
DRSG AQUACEL AG ADV 3.5X14 (GAUZE/BANDAGES/DRESSINGS) ×1 IMPLANT
ELECT BLADE TIP CTD 4 INCH (ELECTRODE) ×2 IMPLANT
ELECT REM PT RETURN 15FT ADLT (MISCELLANEOUS) ×2 IMPLANT
GAUZE SPONGE 4X4 12PLY STRL (GAUZE/BANDAGES/DRESSINGS) IMPLANT
GAUZE XEROFORM 5X9 LF (GAUZE/BANDAGES/DRESSINGS) IMPLANT
GLOVE BIO SURGEON STRL SZ7.5 (GLOVE) ×2 IMPLANT
GLOVE BIO SURGEON STRL SZ8.5 (GLOVE) ×2 IMPLANT
GLOVE BIOGEL PI IND STRL 8 (GLOVE) ×1 IMPLANT
GLOVE BIOGEL PI IND STRL 9 (GLOVE) ×1 IMPLANT
GLOVE BIOGEL PI INDICATOR 8 (GLOVE) ×1
GLOVE BIOGEL PI INDICATOR 9 (GLOVE) ×1
GOWN STRL REIN XL XLG (GOWN DISPOSABLE) ×4 IMPLANT
HEAD CERAMIC BIOLOX DELTA 36 6 (Hips) ×1 IMPLANT
HOLDER FOLEY CATH W/STRAP (MISCELLANEOUS) ×2 IMPLANT
HOOD PEEL AWAY FLYTE STAYCOOL (MISCELLANEOUS) ×8 IMPLANT
IMMOBILIZER KNEE 20 (SOFTGOODS)
IMMOBILIZER KNEE 20 THIGH 36 (SOFTGOODS) IMPLANT
IV NS IRRIG 3000ML ARTHROMATIC (IV SOLUTION) ×2 IMPLANT
KIT BASIN OR (CUSTOM PROCEDURE TRAY) ×2 IMPLANT
KIT TURNOVER KIT A (KITS) IMPLANT
LINER NEUTRAL 36X58 PLUS4 ×1 IMPLANT
NDL HYPO 21X1.5 SAFETY (NEEDLE) ×1 IMPLANT
NDL MAYO CATGUT SZ4 TPR NDL (NEEDLE) IMPLANT
NDL SAFETY ECLIPSE 18X1.5 (NEEDLE) ×1 IMPLANT
NEEDLE HYPO 18GX1.5 SHARP (NEEDLE) ×2
NEEDLE HYPO 21X1.5 SAFETY (NEEDLE) ×2 IMPLANT
NEEDLE MAYO CATGUT SZ4 (NEEDLE) IMPLANT
NS IRRIG 1000ML POUR BTL (IV SOLUTION) ×2 IMPLANT
PACK TOTAL JOINT (CUSTOM PROCEDURE TRAY) ×2 IMPLANT
PASSER SUT SWANSON 36MM LOOP (INSTRUMENTS) ×1 IMPLANT
PENCIL SMOKE EVACUATOR (MISCELLANEOUS) IMPLANT
PROTECTOR NERVE ULNAR (MISCELLANEOUS) ×2 IMPLANT
SCREW 6.5MMX35MM (Screw) ×1 IMPLANT
SPONGE LAP 18X18 RF (DISPOSABLE) IMPLANT
STAPLER VISISTAT 35W (STAPLE) ×2 IMPLANT
SUT ETHIBOND 2 V 37 (SUTURE) ×5 IMPLANT
SUT VIC AB 0 CT1 36 (SUTURE) ×3 IMPLANT
SUT VIC AB 1 CTX 36 (SUTURE) ×2
SUT VIC AB 1 CTX36XBRD ANBCTR (SUTURE) ×1 IMPLANT
SUT VIC AB 2-0 CT1 27 (SUTURE) ×2
SUT VIC AB 2-0 CT1 TAPERPNT 27 (SUTURE) IMPLANT
SUT VIC AB 3-0 CT1 27 (SUTURE) ×2
SUT VIC AB 3-0 CT1 TAPERPNT 27 (SUTURE) ×1 IMPLANT
SWAB COLLECTION DEVICE MRSA (MISCELLANEOUS) IMPLANT
SWAB CULTURE ESWAB REG 1ML (MISCELLANEOUS) IMPLANT
SYR CONTROL 10ML LL (SYRINGE) ×4 IMPLANT
TOWEL OR 17X26 10 PK STRL BLUE (TOWEL DISPOSABLE) ×2 IMPLANT
TOWEL OR NON WOVEN STRL DISP B (DISPOSABLE) ×2 IMPLANT
TOWER CARTRIDGE SMART MIX (DISPOSABLE) IMPLANT
TRAY FOLEY MTR SLVR 14FR STAT (SET/KITS/TRAYS/PACK) ×1 IMPLANT
WATER STERILE IRR 1000ML POUR (IV SOLUTION) ×4 IMPLANT

## 2020-05-23 NOTE — Discharge Instructions (Signed)

## 2020-05-23 NOTE — Op Note (Signed)
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  Postoperative diagnosis: Same  Procedure: Revision right total hip arthroplasty with removal of loose pinnacle cup and femoral head and revision to a 58 mm Gryption cup, +4 polyethylene liner, and a +6 36 mm ceramic head.  Surgeon: Kathalene Frames. Mayer Camel M.D.  Assistant: Kerry Hough. Barton Dubois  (present throughout entire procedure and necessary for timely completion of the procedure)  Estimated blood loss: 400 cc  Fluid replacement: 1600 cc of crystalloid  Complications: None  Indications: Patient had a primary right total hip arthroplasty using a DePuy Pinnacle shell a few years ago.  She came in for an annual checkup and it was noted that the acetabular component had become vertical.  She asked had very little pain.  CT scan showed fibrous layer around the acetabular shell which was obviously loose.  In order to prevent loss of function and increasing pain revision of the acetabular component to a larger Gryption shell with screws was recommended and consented to by the patient.  The risks and benefits of surgery were discussed.  Risks and benefits of revision surgery have been discussed and questions answered.  Procedure: Patient was identified by arm band receive preoperative IV antibiotics in the holding area at, and hospital. He was then taken to the operating room where the appropriate anesthetic monitors were attached and general endotracheal anesthesia induced with the patient in the supine position. He was then rolled into the left lateral decubitus position and fixed there with a mark 2 pelvic clamp. A Foley catheter was inserted and the limb prepped and draped in usual sterile fashion from the ankle to the hemipelvis. Time out procedure performed. Skin along the lateral hip and thigh infiltrated with 10 cc of 1/2% Marcaine and epinephrine  solution. We began the operation by recreating the old posterior lateral incision 15 cm in line through the skin and subcutaneous tissue down to the level of the IT band which was cut in line with the skin incision.  We encountered clear joint fluid in the tissues themselves did not appear to be inflamed.  We then elevated tissue off the posterior neck of the intertrochanteric region down to the neck of the femoral component extended the capsular incision superiorly and inferiorly exposing the femoral head which was dislocated with internal rotation.  The head was removed with a metal cylinder and a mallet the stem was noted to be stable.  We then dissected soft tissue from around the rim of the acetabular component which was obviously loose and vertical.  We are able to talk the proximal stem anterior and superior to the acetabulum and held it there with a Hohmann retractor.  Using the 54 mm Innomed curved osteotomes we then remove the acetabular component which had no evidence of bony ingrowth.  Fortunately after removing the fibrous tissue from the acetabulum the underlying bone was in good condition.  We are able to ream up to a 57 mm basket reamer obtaining good cut in all quadrants.  We then selected a 58 mm Gryption sector cup with dome screw holes and hammered into place and 45 degrees of abduction and 30 degrees of anteversion.  Good fit and fill was accomplished.  For safety we then placed a superior dome screw 35 mm again obtaining a good tight fit.  We then performed trial reductions with a +3 x 36 mm head and a +  6 by 36 mm head.  The +6 head had the best tension and stability to 90 degrees of flexion and 60 of internal rotation.  At this point the trial components were removed we then hammered into place a +4 polyethylene liner followed by a +6 x 36 mm ceramic head on the S-ROM stem.  The hip was again reduced stability was noted to be excellent.  The soft tissues were then injected with Exparel  solution.  The posterior pseudocapsule was repaired back to the intertrochanteric crest through drill holes with #2 Ethibond suture x2.  The wound was thoroughly irrigated out with normal saline solution the IT band was closed with running #1 Vicryl suture in 2 layers subcutaneous tissue with 0 and 3-0 undyed Vicryl suture and the skin with 3-0 undyed Vicryl subcuticular suture.  An Aquacil dressing was applied.  The patient was unclamped awaken and taken to the recovery room without difficulty.

## 2020-05-23 NOTE — Anesthesia Postprocedure Evaluation (Signed)
Anesthesia Post Note  Patient: [name]  Procedure(s) Performed: RIGHT TOTAL POSTERIOR HIP REVISION (Right Hip)     Patient location during evaluation: PACU Anesthesia Type: General Level of consciousness: awake Pain management: pain level controlled Vital Signs Assessment: post-procedure vital signs reviewed and stable Cardiovascular status: stable Postop Assessment: no apparent nausea or vomiting Anesthetic complications: no   No complications documented.  Last Vitals:  Vitals:   05/23/20 1430 05/23/20 1445  BP: 119/67 123/65  Pulse: 65 (!) 53  Resp: 14 11  Temp:    SpO2: 99% 99%    Last Pain:  Vitals:   05/23/20 1430  TempSrc:   PainSc: 0-No pain                 Yecheskel Kurek

## 2020-05-23 NOTE — Evaluation (Signed)
Physical Therapy Evaluation Patient Details Name: Angela Estrada MRN: [id number] DOB: 1950/07/12 Today's Date: 05/23/2020   History of Present Illness  s/p R posterior THA. PMH: R THA, R TSA  Clinical Impression  Pt is s/p THA resulting in the deficits listed below (see PT Problem List).  Pt sleepy but agreeable to OOB, wanting to move. amb ~ 30' with RW  min assist. Anticipate pt will progress steadily.   Pt will benefit from skilled PT to increase their independence and safety with mobility to allow discharge to the venue listed below.      Follow Up Recommendations Follow surgeons recommendation for DC plan and follow-up therapies    Equipment Recommendations  3in1 (PT)    Recommendations for Other Services       Precautions / Restrictions Precautions Precautions: Posterior Hip;Fall Restrictions Weight Bearing Restrictions: No Other Position/Activity Restrictions: WBAT      Mobility  Bed Mobility Overal bed mobility: Needs Assistance Bed Mobility: Supine to Sit     Supine to sit: Min assist     General bed mobility comments: cues for technique and  posterior THP    Transfers Overall transfer level: Needs assistance Equipment used: Rolling walker (2 wheeled) Transfers: Sit to/from Stand Sit to Stand: Min assist         General transfer comment: cues for R LE position, THP, hand placement  Ambulation/Gait Ambulation/Gait assistance: Min assist;Min guard Gait Distance (Feet): 30 Feet Assistive device: Rolling walker (2 wheeled) Gait Pattern/deviations: Step-to pattern     General Gait Details: cues for sequence, posture and RW position  Stairs            Wheelchair Mobility    Modified Rankin (Stroke Patients Only)       Balance                                             Pertinent Vitals/Pain Pain Assessment: 0-10 Pain Score: 2  Pain Location: right hip Pain Descriptors / Indicators: Discomfort Pain  Intervention(s): Limited activity within patient's tolerance;Monitored during session;Premedicated before session;Repositioned    Home Living Family/patient expects to be discharged to:: Private residence Living Arrangements: Spouse/significant other Available Help at Discharge: Family;Available 24 hours/day Type of Home: House Home Access: Stairs to enter   [employer] of Steps: 4 Home Layout: One level;Laundry or work area in [place]: Kasandra Knudsen - single point;Walker - 2 wheels      Prior Function Level of Independence: Independent;Independent with assistive device(s)         Comments: amb with cane prior to surgery     Hand Dominance        Extremity/Trunk Assessment   Upper Extremity Assessment Upper Extremity Assessment: Overall WFL for tasks assessed (hx R TSA)    Lower Extremity Assessment Lower Extremity Assessment: RLE deficits/detail RLE Deficits / Details: ankle WFL, knee and hip grossly 2+/5       Communication   Communication: No difficulties  Cognition Arousal/Alertness: Awake/alert (very sleepy likley d/t anesthesia) Behavior During Therapy: WFL for tasks assessed/performed Overall Cognitive Status: Within Functional Limits for tasks assessed                                        General Comments  Exercises Total Joint Exercises Ankle Circles/Pumps: AROM;Both;10 reps   Assessment/Plan    PT Assessment Patient needs continued PT services  PT Problem List Decreased strength;Decreased range of motion;Decreased activity tolerance;Decreased mobility;Pain;Decreased knowledge of use of DME;Decreased knowledge of precautions       PT Treatment Interventions DME instruction;Therapeutic activities;Gait training;Functional mobility training;Stair training;Therapeutic exercise;Patient/family education    PT Goals (Current goals can be found in the Care Plan section)  Acute Rehab PT Goals Patient Stated Goal:  have less pain PT Goal Formulation: With patient Time For Goal Achievement: 05/30/20 Potential to Achieve Goals: Good    Frequency 7X/week   Barriers to discharge        Co-evaluation               AM-PAC PT "6 Clicks" Mobility  Outcome Measure Help needed turning from your back to your side while in a flat bed without using bedrails?: A Little Help needed moving from lying on your back to sitting on the side of a flat bed without using bedrails?: A Little Help needed moving to and from a bed to a chair (including a wheelchair)?: A Little Help needed standing up from a chair using your arms (e.g., wheelchair or bedside chair)?: A Little Help needed to walk in hospital room?: A Little Help needed climbing 3-5 steps with a railing? : A Lot 6 Click Score: 17    End of Session Equipment Utilized During Treatment: Gait belt Activity Tolerance: Patient tolerated treatment well Patient left: in chair;with call bell/phone within reach;with chair alarm set;with family/visitor present;with nursing/sitter in room Nurse Communication: Mobility status PT Visit Diagnosis: Difficulty in walking, not elsewhere classified (R26.2)    Time: 7035-0093 PT Time Calculation (min) (ACUTE ONLY): 23 min   Charges:   PT Evaluation $PT Eval Low Complexity: 1 Low PT Treatments $Gait Training: 8-22 mins        Baxter Flattery, PT  Acute Rehab Dept ([place]) [phone] Pager [phone]  05/23/2020   [facility] 05/23/2020, 5:32 PM

## 2020-05-23 NOTE — Anesthesia Procedure Notes (Signed)
Procedure Name: Intubation Performed by: Lavina Hamman, CRNA Pre-anesthesia Checklist: Patient identified, Emergency Drugs available, Suction available, Patient being monitored and Timeout performed Patient Re-evaluated:Patient Re-evaluated prior to induction Oxygen Delivery Method: Circle system utilized Preoxygenation: Pre-oxygenation with 100% oxygen Induction Type: IV induction Ventilation: Mask ventilation without difficulty Laryngoscope Size: Mac and 3 Grade View: Grade I Tube type: Oral Tube size: 7.5 mm Number of attempts: 1 Airway Equipment and Method: Stylet Placement Confirmation: ETT inserted through vocal cords under direct vision,  positive ETCO2,  CO2 detector and breath sounds checked- equal and bilateral Secured at: 22 cm Tube secured with: Tape Dental Injury: Teeth and Oropharynx as per pre-operative assessment

## 2020-05-23 NOTE — Anesthesia Preprocedure Evaluation (Addendum)
Anesthesia Evaluation  Patient identified by MRN, date of birth, ID band Patient awake    Reviewed: Allergy & Precautions, NPO status , Patient's Chart, lab work & pertinent test results  Airway Mallampati: II  TM Distance: >3 FB     Dental   Pulmonary pneumonia,    breath sounds clear to auscultation       Cardiovascular hypertension,  Rhythm:Regular Rate:Normal     Neuro/Psych    GI/Hepatic Neg liver ROS, GERD  ,  Endo/Other  negative endocrine ROS  Renal/GU negative Renal ROS     Musculoskeletal   Abdominal   Peds  Hematology   Anesthesia Other Findings   Reproductive/Obstetrics                             Anesthesia Physical Anesthesia Plan  ASA: III  Anesthesia Plan: Spinal   Post-op Pain Management:    Induction: Intravenous  PONV Risk Score and Plan: 3 and Ondansetron, Dexamethasone and Midazolam  Airway Management Planned: Nasal Cannula and Simple Face Mask  Additional Equipment:   Intra-op Plan:   Post-operative Plan: Extubation in OR  Informed Consent: I have reviewed the patients History and Physical, chart, labs and discussed the procedure including the risks, benefits and alternatives for the proposed anesthesia with the patient or authorized representative who has indicated his/her understanding and acceptance.       Plan Discussed with: CRNA and Anesthesiologist  Anesthesia Plan Comments:        Anesthesia Quick Evaluation

## 2020-05-23 NOTE — Plan of Care (Signed)
  Problem: Activity: Goal: Risk for activity intolerance will decrease Outcome: Progressing   Problem: Nutrition: Goal: Adequate nutrition will be maintained Outcome: Progressing   Problem: Pain Managment: Goal: General experience of comfort will improve Outcome: Progressing   

## 2020-05-23 NOTE — Transfer of Care (Signed)
Immediate Anesthesia Transfer of Care Note  Patient: Angela Estrada  Procedure(s) Performed: Procedure(s): RIGHT TOTAL POSTERIOR HIP REVISION (Right)  Patient Location: PACU  Anesthesia Type:General  Level of Consciousness:  sedated, patient cooperative and responds to stimulation  Airway & Oxygen Therapy:Patient Spontanous Breathing and Patient connected to face mask oxgen  Post-op Assessment:  Report given to PACU RN and Post -op Vital signs reviewed and stable  Post vital signs:  Reviewed and stable  Last Vitals:  Vitals:   05/23/20 0835 05/23/20 0848  BP: (!) 191/105 (!) 163/81  Pulse: 77   Resp: 16   Temp: 36.8 C   SpO2: 98%     Complications: No apparent anesthesia complications

## 2020-05-23 NOTE — Interval H&P Note (Signed)
History and Physical Interval Note:  05/23/2020 8:51 AM  Angela Estrada  has presented today for surgery, with the diagnosis of LOOSE RIGHT HIP REPLACEMENT.  The various methods of treatment have been discussed with the patient and family. After consideration of risks, benefits and other options for treatment, the patient has consented to  Procedure(s): RIGHT TOTAL POSTERIOR HIP REVISION (Right) as a surgical intervention.  The patient's history has been reviewed, patient examined, no change in status, stable for surgery.  I have reviewed the patient's chart and labs.  Questions were answered to the patient's satisfaction.     Kerin Salen

## 2020-05-23 NOTE — Progress Notes (Signed)
PHARMACIST - PHYSICIAN ORDER COMMUNICATION  CONCERNING: P&T Medication Policy on Herbal Medications  DESCRIPTION:  This patient's order for:  prevagen  has been noted.  This product(s) is classified as an "herbal" or natural product. Due to a lack of definitive safety studies or FDA approval, nonstandard manufacturing practices, plus the potential risk of unknown drug-drug interactions while on inpatient medications, the Pharmacy and Therapeutics Committee does not permit the use of "herbal" or natural products of this type within [facility].   ACTION TAKEN: The pharmacy department is unable to verify this order at this time and your patient has been informed of this safety policy. Please reevaluate patient's clinical condition at discharge and address if the herbal or natural product(s) should be resumed at that time. Eudelia Bunch, Pharm.D 05/23/2020 3:41 PM

## 2020-05-24 ENCOUNTER — Encounter (HOSPITAL_COMMUNITY): Payer: Self-pay | Admitting: Orthopedic Surgery

## 2020-05-24 DIAGNOSIS — T84090A Other mechanical complication of internal right hip prosthesis, initial encounter: Secondary | ICD-10-CM | POA: Diagnosis not present

## 2020-05-24 LAB — CBC
HCT: 36.1 % (ref 36.0–46.0)
Hemoglobin: 12.2 g/dL (ref 12.0–15.0)
MCH: 30.6 pg (ref 26.0–34.0)
MCHC: 33.8 g/dL (ref 30.0–36.0)
MCV: 90.5 fL (ref 80.0–100.0)
Platelets: 230 10*3/uL (ref 150–400)
RBC: 3.99 MIL/uL (ref 3.87–5.11)
RDW: 13.2 % (ref 11.5–15.5)
WBC: 11.7 10*3/uL — ABNORMAL HIGH (ref 4.0–10.5)
nRBC: 0 % (ref 0.0–0.2)

## 2020-05-24 LAB — BASIC METABOLIC PANEL
Anion gap: 9 (ref 5–15)
BUN: 11 mg/dL (ref 8–23)
CO2: 24 mmol/L (ref 22–32)
Calcium: 8.6 mg/dL — ABNORMAL LOW (ref 8.9–10.3)
Chloride: 102 mmol/L (ref 98–111)
Creatinine, Ser: 0.54 mg/dL (ref 0.44–1.00)
GFR, Estimated: >60 mL/min (ref >60)
Glucose, Bld: 139 mg/dL — ABNORMAL HIGH (ref 70–99)
Potassium: 3.6 mmol/L (ref 3.5–5.1)
Sodium: 135 mmol/L (ref 135–145)

## 2020-05-24 NOTE — TOC Transition Note (Signed)
Transition of Care [facility]) - CM/SW Discharge Note   Patient Details  Name: Angela Estrada MRN: [id number] Date of Birth: 07/22/1950  Transition of Care [facility]) CM/SW Contact:  Lia Hopping, [place] Phone Number: 05/24/2020, 10:22 AM   Clinical Narrative:    Therapy Plan: HHPT [facility] Patient confirm she has RW, however needs a 3 IN1. 3 IN 1 ordered through [place] and delivered to the patient bedside.    Final next level of care: [place] Barriers to Discharge: Barriers Resolved   Patient Goals and CMS Choice        Discharge Placement                       Discharge Plan and Services                DME Arranged: 3-N-1 DME Agency: [employer] Date DME Agency Contacted: 05/24/20 Time DME Agency Contacted: [id number] Representative spoke with at DME Agency: Ovid Curd HH Arranged: PT [place]: [facility] (formerly [employer]) Date [place]: 05/24/20 Time [place]: 0945 Representative spoke with at [place]: [place] ([place]) Interventions     Readmission Risk Interventions No flowsheet data found.

## 2020-05-24 NOTE — Progress Notes (Signed)
Physical Therapy Treatment Patient Details Name: Angela Estrada MRN: [id number] DOB: May 07, 1951 Today's Date: 05/24/2020    History of Present Illness s/p R posterior THA revision. PMH: R THA, R TSA    PT Comments    Pt progressing well. Reviewed stairs, amb, THP. See below for details. Will see for a second session and pt should be ready for d/c later today   Follow Up Recommendations  Follow surgeon's recommendation for DC plan and follow-up therapies     Equipment Recommendations  3in1 (PT)    Recommendations for Other Services       Precautions / Restrictions Precautions Precautions: Posterior Hip;Fall Restrictions Weight Bearing Restrictions: No Other Position/Activity Restrictions: WBAT    Mobility  Bed Mobility               General bed mobility comments: in recliner on arrival  Transfers Overall transfer level: Needs assistance Equipment used: Rolling walker (2 wheeled) Transfers: Sit to/from Stand Sit to Stand: Min guard;Supervision         General transfer comment: cues for R LE position, THP, hand placement  Ambulation/Gait Ambulation/Gait assistance: Min guard Gait Distance (Feet): 80 Feet Assistive device: Rolling walker (2 wheeled) Gait Pattern/deviations: Step-to pattern     General Gait Details: cues for sequence, posture and RW position   Stairs Stairs: Yes Stairs assistance: Min guard Stair Management: One rail Left;Step to pattern;Forwards Number of Stairs: 3 General stair comments: cues for sequence, min/guard for safety   Wheelchair Mobility    Modified Rankin (Stroke Patients Only)       Balance                                            Cognition Arousal/Alertness: Awake/alert Behavior During Therapy: WFL for tasks assessed/performed Overall Cognitive Status: Within Functional Limits for tasks assessed                                        Exercises      General  Comments        Pertinent Vitals/Pain Pain Assessment: 0-10 Pain Score: 3  Pain Location: right hip Pain Descriptors / Indicators: Discomfort Pain Intervention(s): Limited activity within patient's tolerance;Monitored during session;Premedicated before session;Repositioned;Ice applied    Home Living                      Prior Function            PT Goals (current goals can now be found in the care plan section) Acute Rehab PT Goals Patient Stated Goal: have less pain PT Goal Formulation: With patient Time For Goal Achievement: 05/30/20 Potential to Achieve Goals: Good Progress towards PT goals: Progressing toward goals    Frequency    7X/week      PT Plan Current plan remains appropriate    Co-evaluation              AM-PAC PT "6 Clicks" Mobility   Outcome Measure  Help needed turning from your back to your side while in a flat bed without using bedrails?: A Little Help needed moving from lying on your back to sitting on the side of a flat bed without using bedrails?: A Little Help needed moving to and from a bed to  a chair (including a wheelchair)?: A Little Help needed standing up from a chair using your arms (e.g., wheelchair or bedside chair)?: A Little Help needed to walk in hospital room?: A Little Help needed climbing 3-5 steps with a railing? : A Little 6 Click Score: 18    End of Session Equipment Utilized During Treatment: Gait belt Activity Tolerance: Patient tolerated treatment well Patient left: in chair;with call bell/phone within reach;with chair alarm set;with family/visitor present;with nursing/sitter in room   PT Visit Diagnosis: Difficulty in walking, not elsewhere classified (R26.2)     Time: 7591-6384 PT Time Calculation (min) (ACUTE ONLY): 19 min  Charges:  $Gait Training: 8-22 mins                     Baxter Flattery, PT  Acute Rehab Dept ([place]) [phone] Pager [phone]  05/24/2020    [facility] 05/24/2020,  11:37 AM

## 2020-05-24 NOTE — Progress Notes (Signed)
PATIENT ID: Angela Estrada  MRN: [id number]  DOB/AGE:  February 28, 1951 / 69 y.o.  1 Day Post-Op Procedure(s) (LRB): RIGHT TOTAL POSTERIOR HIP REVISION (Right)    PROGRESS NOTE Subjective: Patient is alert, oriented, no Nausea, no Vomiting, yes passing gas, . Taking PO well. Denies SOB, Chest or Calf Pain. Using Incentive Spirometer, PAS in place. Ambulate 21' Patient reports pain as  2/10  .    Objective: Vital signs in last 24 hours: Vitals:   05/23/20 1840 05/23/20 2009 05/24/20 0207 05/24/20 0538  BP: (Abnormal) 163/75 134/65 140/64 (Abnormal) 151/87  Pulse: 72 75 71 76  Resp: 18 16 14 16   Temp: (Abnormal) 97.4 F (36.3 C) 97.8 F (36.6 C) 98.3 F (36.8 C) 98.1 F (36.7 C)  TempSrc: Oral Oral Oral Oral  SpO2: 98% 98% 95% 94%  Weight:      Height:          Intake/Output from previous day: I/O last 3 completed shifts: In: 2798.9 [P.O.:720; I.V.:1978.9; IV Piggyback:100] Out: 2340 [Urine:2190; Blood:150]   Intake/Output this shift: No intake/output data recorded.   LABORATORY DATA: Recent Labs    05/23/20 0837 05/24/20 0315  WBC  --  11.7*  HGB  --  12.2  HCT  --  36.1  PLT  --  230  NA  --  135  K  --  3.6  CL  --  102  CO2  --  24  BUN  --  11  CREATININE  --  0.54  GLUCOSE  --  139*  GLUCAP 99  --   CALCIUM  --  8.6*    Examination: Neurologically intact ABD soft Neurovascular intact Sensation intact distally Intact pulses distally Dorsiflexion/Plantar flexion intact Incision: dressing C/D/I No cellulitis present Compartment soft} XR AP&Lat of hip shows well placed\fixed THA  Assessment:   1 Day Post-Op Procedure(s) (LRB): RIGHT TOTAL POSTERIOR HIP REVISION (Right) ADDITIONAL DIAGNOSIS:  Expected Acute Blood Loss Anemia, Diabetes  Patient's anticipated LOS is less than 2 midnights, meeting these requirements: - Younger than 42 - Lives within 1 hour of care - Has a competent adult at home to recover with post-op recover - NO history of  -  Chronic pain requiring opiods  - Diabetes  - Coronary Artery Disease  - Heart failure  - Heart attack  - Stroke  - DVT/VTE  - Cardiac arrhythmia  - Respiratory Failure/COPD  - Renal failure  - Anemia  - Advanced Liver disease       Plan: PT/OT WBAT, THA  DVT Prophylaxis: SCDx72 hrs, ASA 81 mg BID x 2 weeks  DISCHARGE PLAN: Home, today if she passes physical therapy  DISCHARGE NEEDS: HHPT, Walker and 3-in-1 comode seatPatient ID: Angela Estrada, female   DOB: 1950-11-13, 69 y.o.   MRN: [id number]

## 2020-05-24 NOTE — Discharge Summary (Signed)
Patient ID: Angela Estrada MRN: [id number] DOB/AGE: 1950-07-26 69 y.o.  Admit date: 05/23/2020 Discharge date: 05/24/2020  Admission Diagnoses:  Principal Problem:   Failed total hip arthroplasty [facility]) Active Problems:   History of revision of total replacement of right hip joint   Discharge Diagnoses:  Same  Past Medical History:  Diagnosis Date  . Abdominal hernia    RLQ  . Allergy   . Arthritis   . Arthritis   . Borderline diabetes   . GERD (gastroesophageal reflux disease)   . HYPERCHOLESTEROLEMIA 04/25/2007   takes Pravastatin daily  . HYPERTENSION 01/03/2007   takes Losartan-HCTZ  and Amlodipine daily  . Hypokalemia    takes Potassium daily  . Joint pain   . Nocturia   . Pneumonia 2 yrs ago  . Primary osteoarthritis of shoulder    Right    Surgeries: Procedure(s): RIGHT TOTAL POSTERIOR HIP REVISION on 05/23/2020   Consultants:   Discharged Condition: Improved  Hospital Course: Angela Estrada is an 69 y.o. female who was admitted 05/23/2020 for operative treatment ofFailed total hip arthroplasty ([place]). Patient has severe unremitting pain that affects sleep, daily activities, and work/hobbies. After pre-op clearance the patient was taken to the operating room on 05/23/2020 and underwent  Procedure(s): RIGHT TOTAL POSTERIOR HIP REVISION.    Patient was given perioperative antibiotics:  Anti-infectives (From admission, onward)   Start     Dose/Rate Route Frequency Ordered Stop   05/23/20 0830  ceFAZolin (ANCEF) IVPB 2g/100 mL premix        2 g 200 mL/hr over 30 Minutes Intravenous On call to O.R. 05/23/20 0820 05/23/20 1107       Patient was given sequential compression devices, early ambulation, and chemoprophylaxis to prevent DVT.  Patient benefited maximally from hospital stay and there were no complications.    Recent vital signs:  Patient Vitals for the past 24 hrs:  BP Temp Temp src Pulse Resp SpO2 Height Weight  05/24/20 0538  (Abnormal) 151/87 98.1 F (36.7 C) Oral 76 16 94 % no documentation no documentation  05/24/20 0207 140/64 98.3 F (36.8 C) Oral 71 14 95 % no documentation no documentation  05/23/20 2009 134/65 97.8 F (36.6 C) Oral 75 16 98 % no documentation no documentation  05/23/20 1840 (Abnormal) 163/75 (Abnormal) 97.4 F (36.3 C) Oral 72 18 98 % no documentation no documentation  05/23/20 1740 (Abnormal) 170/84 (Abnormal) 97.3 F (36.3 C) Axillary 60 20 100 % no documentation no documentation  05/23/20 1648 135/78 (Abnormal) 97.2 F (36.2 C) Oral (Abnormal) 59 18 100 % no documentation no documentation  05/23/20 1528 138/66 (Abnormal) 97.4 F (36.3 C) no documentation 61 14 99 % no documentation no documentation  05/23/20 1500 123/65 no documentation no documentation (Abnormal) 54 (Abnormal) 8 94 % no documentation no documentation  05/23/20 1445 123/65 no documentation no documentation (Abnormal) 53 11 99 % no documentation no documentation  05/23/20 1430 119/67 no documentation no documentation 65 14 99 % no documentation no documentation  05/23/20 1415 115/67 no documentation no documentation (Abnormal) 55 (Abnormal) 9 97 % no documentation no documentation  05/23/20 1401 120/71 97.9 F (36.6 C) no documentation 70 12 93 % no documentation no documentation  05/23/20 0848 (Abnormal) 163/81 no documentation no documentation no documentation no documentation no documentation no documentation no documentation  05/23/20 0837 no documentation no documentation no documentation no documentation no documentation no documentation 5\' 2"  (1.575 m) 103 kg  05/23/20 0835 (Abnormal) 191/105 98.2 F (36.8  C) Oral 77 16 97 % no documentation no documentation     Recent laboratory studies:  Recent Labs    05/24/20 0315  WBC 11.7*  HGB 12.2  HCT 36.1  PLT 230  NA 135  K 3.6  CL 102  CO2 24  BUN 11  CREATININE 0.54  GLUCOSE 139*  CALCIUM 8.6*     Discharge Medications:   Allergies as of  05/24/2020    Allergen Reactions Comments   Lisinopril Cough    Oxycodone Other (See Comments) Hallucinations      Medication List    Take these medications   amLODipine 2.5 MG tablet Commonly known as: NORVASC Take 1 tablet (2.5 mg total) by mouth daily.   aspirin EC 81 MG tablet Take 1 tablet (81 mg total) by mouth 2 (two) times daily. What changed:   when to take this  additional instructions   cetirizine-pseudoephedrine 5-120 MG tablet Commonly known as: ZYRTEC-D Take 1 tablet by mouth daily as needed for allergies.   famotidine 20 MG tablet Commonly known as: PEPCID Take 1 tablet (20 mg total) by mouth 2 (two) times daily.   HYDROmorphone 2 MG tablet Commonly known as: Dilaudid Take 1 tablet (2 mg total) by mouth every 4 (four) hours as needed for severe pain.   LUBRICATING EYE DROPS OP Place 1 drop into both eyes daily as needed (dry eyes).   multivitamin with minerals Tabs tablet Take 1 tablet by mouth daily.   Olopatadine HCl 0.2 % Soln Apply 1 drop to eye daily.   omeprazole 20 MG capsule Commonly known as: PRILOSEC Take 1 capsule (20 mg total) by mouth daily as needed (reflux/heartburn).   oxybutynin 5 MG tablet Commonly known as: DITROPAN Take 1 tablet (5 mg total) by mouth 2 (two) times daily.   potassium chloride SA 20 MEQ tablet Commonly known as: Klor-Con M20 Take 1 tablet (20 mEq total) by mouth 2 (two) times daily.   pravastatin 20 MG tablet Commonly known as: PRAVACHOL Take 1 tablet (20 mg total) by mouth every evening.   Prevagen 10 MG Caps Generic drug: Apoaequorin Take 10 mg by mouth daily.   telmisartan-hydrochlorothiazide 80-25 MG tablet Commonly known as: MICARDIS HCT Take 1 tablet by mouth daily.   tiZANidine 2 MG tablet Commonly known as: ZANAFLEX Take 1 tablet (2 mg total) by mouth every 6 (six) hours as needed for muscle spasms.   triamcinolone 0.1 % Commonly known as: KENALOG Apply 1 application topically 2 (two)  times daily.        Durable Medical Equipment  (From admission, onward)         Start     Ordered   05/23/20 1531  DME Walker rolling  Once       Question:  Patient needs a walker to treat with the following condition  Answer:  Status post right hip replacement   05/23/20 1530   05/23/20 1531  DME 3 n 1  Once        05/23/20 1530           Discharge Care Instructions  (From admission, onward)         Start     Ordered   05/24/20 0000  Change dressing       Comments: Change dressing Only if drainage exceeds 40% of window on dressing   05/24/20 0729          Diagnostic Studies: DG Chest 2 View  Result Date: 05/13/2020 CLINICAL  DATA:  Shortness of breath.  Preop for right hip revision. EXAM: CHEST - 2 VIEW COMPARISON:  May 02, 2020. FINDINGS: The heart size and mediastinal contours are within normal limits. Both lungs are clear. No pneumothorax or pleural effusion is noted. The visualized skeletal structures are unremarkable. IMPRESSION: No active cardiopulmonary disease. Electronically Signed   By: Marijo Conception M.D.   On: 05/13/2020 10:24   DG Chest 2 View  Result Date: 05/02/2020 CLINICAL DATA:  Preop EXAM: CHEST - 2 VIEW COMPARISON:  09/20/2017 FINDINGS: Heart is normal size. Minimal left basilar opacity, likely atelectasis. Right lung clear. No effusions or acute bony abnormality. IMPRESSION: Left basilar/lingular atelectasis. Electronically Signed   By: Rolm Baptise M.D.   On: 05/02/2020 16:28    Disposition: Discharge disposition: 01-Home or Self Care       Discharge Instructions    Call MD / Call 911   Complete by: As directed    If you experience chest pain or shortness of breath, CALL 911 and be transported to the hospital emergency room.  If you develope a fever above 101 F, pus (white drainage) or increased drainage or redness at the wound, or calf pain, call your surgeon's office.   Change dressing   Complete by: As directed    Change  dressing Only if drainage exceeds 40% of window on dressing   Constipation Prevention   Complete by: As directed    Drink plenty of fluids.  Prune juice may be helpful.  You may use a stool softener, such as Colace (over the counter) 100 mg twice a day.  Use MiraLax (over the counter) for constipation as needed.   Diet - low sodium heart healthy   Complete by: As directed    Increase activity slowly as tolerated   Complete by: As directed        Follow-up Information    Frederik Pear, MD In 2 weeks.   Specialty: Orthopedic Surgery Contact information: [place] [address] [phone]                Signed: Kerin Salen 05/24/2020, 7:30 AM

## 2020-05-24 NOTE — Progress Notes (Signed)
   05/24/20 1300  PT Visit Information  Last PT Received On 05/24/20  Assistance Needed +1   Reviewed posterior THA  HEP. Pt tolerated well. Reviewed THP with regard to mobility, sleeping with pillow between knees of s/l, etc. Pt and spouse verbalize understanding. Handouts given on precautions and ex program. Pt is ready to d/c with family assist from PT standpoint.   History of Present Illness s/p R posterior THA revision. PMH: R THA, R TSA  Subjective Data  Patient Stated Goal have less pain  Precautions  Precautions Posterior Hip;Fall  Restrictions  Other Position/Activity Restrictions WBAT  Pain Assessment  Pain Assessment 0-10  Pain Score 3  Pain Location right hip  Pain Descriptors / Indicators Discomfort  Pain Intervention(s) Limited activity within patient's tolerance;Monitored during session;Ice applied;Premedicated before session  Cognition  Arousal/Alertness Awake/alert  Behavior During Therapy WFL for tasks assessed/performed  Overall Cognitive Status Within Functional Limits for tasks assessed  Bed Mobility  General bed mobility comments in recliner on arrival  Exercises  Exercises Total Joint  Total Joint Exercises  Ankle Circles/Pumps AROM;Both;10 reps  Quad Sets AROM;Right;10 reps  Short Arc Quad AROM;Right;10 reps  Long Arc Quad AROM;Right;10 reps  Heel Slides AROM;Right;10 reps  Hip ABduction/ADduction AROM;Right;10 reps  PT - End of Session  Activity Tolerance Patient tolerated treatment well  Patient left in chair;with call bell/phone within reach;with chair alarm set;with family/visitor present  Nurse Communication Mobility status   PT - Assessment/Plan  PT Plan Current plan remains appropriate  PT Visit Diagnosis Difficulty in walking, not elsewhere classified (R26.2)  PT Frequency (ACUTE ONLY) 7X/week  Follow Up Recommendations Follow surgeon's recommendation for DC plan and follow-up therapies (HHPT)  PT equipment 3in1 (PT) (delivered)  AM-PAC  PT "6 Clicks" Mobility Outcome Measure (Version 2)  Help needed turning from your back to your side while in a flat bed without using bedrails? 3  Help needed moving from lying on your back to sitting on the side of a flat bed without using bedrails? 3  Help needed moving to and from a bed to a chair (including a wheelchair)? 3  Help needed standing up from a chair using your arms (e.g., wheelchair or bedside chair)? 3  Help needed to walk in hospital room? 3  Help needed climbing 3-5 steps with a railing?  3  6 Click Score 18  Consider Recommendation of Discharge To: Home with [facility]  PT Goal Progression  Progress towards PT goals Progressing toward goals  Acute Rehab PT Goals  PT Goal Formulation With patient  Time For Goal Achievement 05/30/20  Potential to Achieve Goals Good  PT Time Calculation  PT Start Time (ACUTE ONLY) 1340  PT Stop Time (ACUTE ONLY) 1354  PT Time Calculation (min) (ACUTE ONLY) 14 min  PT General Charges  $$ ACUTE PT VISIT 1 Visit  PT Treatments  $Therapeutic Exercise 8-22 mins

## 2020-05-24 NOTE — Plan of Care (Signed)
  Problem: Health Behavior/Discharge Planning: Goal: Ability to manage health-related needs will improve Outcome: Progressing   Problem: Clinical Measurements: Goal: Ability to maintain clinical measurements within normal limits will improve Outcome: Progressing Goal: Will remain free from infection Outcome: Progressing Goal: Diagnostic test results will improve Outcome: Progressing Goal: Respiratory complications will improve Outcome: Progressing Goal: Cardiovascular complication will be avoided Outcome: Progressing   Problem: Activity: Goal: Risk for activity intolerance will decrease Outcome: Progressing   Problem: Nutrition: Goal: Adequate nutrition will be maintained Outcome: Progressing   Problem: Elimination: Goal: Will not experience complications related to bowel motility Outcome: Progressing Goal: Will not experience complications related to urinary retention Outcome: Progressing   Problem: Pain Managment: Goal: General experience of comfort will improve Outcome: Progressing   Problem: Skin Integrity: Goal: Risk for impaired skin integrity will decrease Outcome: Progressing   Problem: Safety: Goal: Ability to remain free from injury will improve Outcome: Progressing   Problem: Education: Goal: Knowledge of the prescribed therapeutic regimen will improve Outcome: Progressing Goal: Understanding of discharge needs will improve Outcome: Progressing   Problem: Activity: Goal: Ability to avoid complications of mobility impairment will improve Outcome: Progressing Goal: Ability to tolerate increased activity will improve Outcome: Progressing   Problem: Pain Management: Goal: Pain level will decrease with appropriate interventions Outcome: Progressing   Problem: Clinical Measurements: Goal: Postoperative complications will be avoided or minimized Outcome: Progressing   Problem: Skin Integrity: Goal: Will show signs of wound healing Outcome:  Progressing

## 2020-05-25 DIAGNOSIS — M1611 Unilateral primary osteoarthritis, right hip: Secondary | ICD-10-CM | POA: Diagnosis not present

## 2020-05-25 DIAGNOSIS — R7303 Prediabetes: Secondary | ICD-10-CM | POA: Diagnosis not present

## 2020-05-25 DIAGNOSIS — Z96653 Presence of artificial knee joint, bilateral: Secondary | ICD-10-CM | POA: Diagnosis not present

## 2020-05-25 DIAGNOSIS — I10 Essential (primary) hypertension: Secondary | ICD-10-CM | POA: Diagnosis not present

## 2020-05-25 DIAGNOSIS — K769 Liver disease, unspecified: Secondary | ICD-10-CM | POA: Diagnosis not present

## 2020-05-25 DIAGNOSIS — E669 Obesity, unspecified: Secondary | ICD-10-CM | POA: Diagnosis not present

## 2020-05-25 DIAGNOSIS — E7800 Pure hypercholesterolemia, unspecified: Secondary | ICD-10-CM | POA: Diagnosis not present

## 2020-05-25 DIAGNOSIS — K439 Ventral hernia without obstruction or gangrene: Secondary | ICD-10-CM | POA: Diagnosis not present

## 2020-05-25 DIAGNOSIS — Z471 Aftercare following joint replacement surgery: Secondary | ICD-10-CM | POA: Diagnosis not present

## 2020-05-25 DIAGNOSIS — Z6841 Body Mass Index (BMI) 40.0 and over, adult: Secondary | ICD-10-CM | POA: Diagnosis not present

## 2020-05-25 DIAGNOSIS — T84030D Mechanical loosening of internal right hip prosthetic joint, subsequent encounter: Secondary | ICD-10-CM | POA: Diagnosis not present

## 2020-05-25 DIAGNOSIS — Z7982 Long term (current) use of aspirin: Secondary | ICD-10-CM | POA: Diagnosis not present

## 2020-06-07 DIAGNOSIS — Z471 Aftercare following joint replacement surgery: Secondary | ICD-10-CM | POA: Diagnosis not present

## 2020-06-07 DIAGNOSIS — Z96641 Presence of right artificial hip joint: Secondary | ICD-10-CM | POA: Diagnosis not present

## 2020-06-13 ENCOUNTER — Ambulatory Visit (HOSPITAL_COMMUNITY): Payer: Medicare HMO | Admitting: Physical Therapy

## 2020-06-15 ENCOUNTER — Other Ambulatory Visit: Payer: Self-pay

## 2020-06-15 ENCOUNTER — Ambulatory Visit (HOSPITAL_COMMUNITY): Payer: Medicare HMO | Attending: Orthopedic Surgery | Admitting: Physical Therapy

## 2020-06-15 ENCOUNTER — Encounter (HOSPITAL_COMMUNITY): Payer: Self-pay | Admitting: Physical Therapy

## 2020-06-15 DIAGNOSIS — R2689 Other abnormalities of gait and mobility: Secondary | ICD-10-CM

## 2020-06-15 DIAGNOSIS — M25551 Pain in right hip: Secondary | ICD-10-CM | POA: Insufficient documentation

## 2020-06-15 DIAGNOSIS — M6281 Muscle weakness (generalized): Secondary | ICD-10-CM | POA: Diagnosis not present

## 2020-06-15 NOTE — Patient Instructions (Signed)
Access Code: [email or web address] URL: [email or web address] Date: 06/15/2020 Prepared by: Georges Lynch  Exercises Supine Heel Slide - 2-3 x daily - 7 x weekly - 1-2 sets - 10 reps

## 2020-06-15 NOTE — Therapy (Signed)
[place], [place], [address] Phone: [phone]   Fax:  [phone]  Physical Therapy Evaluation  Patient Details  Name: Angela Estrada MRN: [id number] Date of Birth: 22-Nov-1950 Referring Provider (PT): Frederik Pear MD   Encounter Date: 06/15/2020   PT End of Session - 06/15/20 1201    Visit Number 1    Number of Visits 12    Date for PT Re-Evaluation 07/29/20    Authorization Type [employer]    Progress Note Due on Visit 10    PT Start Time 1120    PT Stop Time 1155    PT Time Calculation (min) 35 min    Equipment Utilized During Treatment Gait belt    Activity Tolerance Patient tolerated treatment well    Behavior During Therapy [facility] for tasks assessed/performed           Past Medical History:  Diagnosis Date  . Abdominal hernia    RLQ  . Allergy   . Arthritis   . Arthritis   . Borderline diabetes   . GERD (gastroesophageal reflux disease)   . HYPERCHOLESTEROLEMIA 04/25/2007   takes Pravastatin daily  . HYPERTENSION 01/03/2007   takes Losartan-HCTZ  and Amlodipine daily  . Hypokalemia    takes Potassium daily  . Joint pain   . Nocturia   . Pneumonia 2 yrs ago  . Primary osteoarthritis of shoulder    Right    Past Surgical History:  Procedure Laterality Date  . CESAREAN SECTION  25 years ago  . CHOLECYSTECTOMY    . COLONOSCOPY    . DILATION AND CURETTAGE OF UTERUS  35 years ago  . HERNIA REPAIR    . INCISIONAL HERNIA REPAIR  02/21/2012   Procedure: LAPAROSCOPIC INCISIONAL HERNIA;  Surgeon: Harl Bowie, MD;  Location: [place];  Service: General;  Laterality: N/A;  laparoscopic incisional hernia repair with mesh  . JOINT REPLACEMENT     bilateral knee replacements  . REPLACEMENT TOTAL KNEE BILATERAL  2010  . TOTAL HIP ARTHROPLASTY Right 03/19/2016   Procedure: RIGHT TOTAL HIP ARTHROPLASTY ANTERIOR APPROACH;  Surgeon: Frederik Pear, MD;  Location: [place];  Service: Orthopedics;   Laterality: Right;  . TOTAL HIP REVISION Right 10/02/2017   Procedure: TOTAL HIP REVISION POSTERIOR;  Surgeon: Frederik Pear, MD;  Location: [place];  Service: Orthopedics;  Laterality: Right;  . TOTAL HIP REVISION Right 05/23/2020   Procedure: RIGHT TOTAL POSTERIOR HIP REVISION;  Surgeon: Frederik Pear, MD;  Location: [facility];  Service: Orthopedics;  Laterality: Right;  . TOTAL SHOULDER ARTHROPLASTY Right 12/19/2017   Procedure: RIGHT TOTAL SHOULDER ARTHROPLASTY;  Surgeon: Tania Ade, MD;  Location: [facility];  Service: Orthopedics;  Laterality: Right;  . TUBAL LIGATION      There were no vitals filed for this visit.    Subjective Assessment - 06/15/20 1126    Subjective Patient presents to physical therapy with complaint of RT hip pain s/p RT THA 05/23/20. Patient says things are going well overall. She notes pain fluctuates, and is managing symptoms with pain medication. Patient currently ambulates with RW, states she has used cane prior. She notes her balance has been historically poor. Patient currently wearing compression stockings. Patient had home health therapy for 5 visits.    Limitations Sitting;Lifting;Standing;Walking;House hold activities    Currently in Pain? Yes    Pain Score 4     Pain Location Hip    Pain Orientation Right;Lateral  Pain Descriptors / Indicators Sore    Pain Type Surgical pain    Pain Onset 1 to 4 weeks ago    Pain Frequency Intermittent    Aggravating Factors  prolonged sitting    Pain Relieving Factors pain meds, movement, walking    Effect of Pain on Daily Activities Limits              [facility] PT Assessment - 06/15/20 0001      Assessment   Medical Diagnosis RT THA    Referring Provider (PT) Gean Birchwood MD    Onset Date/Surgical Date 05/23/20    Prior Therapy yes HH      Precautions   Precautions Fall      Restrictions   Weight Bearing Restrictions No      Balance Screen   Has the patient fallen in the past 6 months No      Home  Environment   Living Environment Private residence    Living Arrangements Spouse/significant other    Home Access Stairs to enter    Entrance Stairs-Number of Steps 4    Entrance Stairs-Rails Can reach both      Prior Function   Level of Independence Independent      Cognition   Overall Cognitive Status Within Functional Limits for tasks assessed      Observation/Other Assessments   Focus on Therapeutic Outcomes (FOTO)  52% function      ROM / Strength   AROM / PROM / Strength AROM      AROM   AROM Assessment Site Hip    Right/Left Hip Right;Left    Right Hip Extension 0    Right Hip Flexion 75      Transfers   Five time sit to stand comments  23.5 seconds with UEs      Ambulation/Gait   Ambulation/Gait Yes    Ambulation/Gait Assistance 6: Modified independent (Device/Increase time)    Ambulation Distance (Feet) 235 Feet    Assistive device Rolling walker    Gait Pattern Decreased stance time - right;Decreased step length - left;Decreased stride length;Trunk flexed    Ambulation Surface Level;Indoor    Gait Comments                      Objective measurements completed on examination: See above findings.       [facility] Adult PT Treatment/Exercise - 06/15/20 0001      Exercises   Exercises Knee/Hip      Knee/Hip Exercises: Supine   Heel Slides Right;5 reps                  PT Education - 06/15/20 1128    Education Details on evaluation findings, POC and HEP    Person(s) Educated Patient    Methods Explanation;Handout    Comprehension Verbalized understanding            PT Short Term Goals - 06/15/20 1207      PT SHORT TERM GOAL #1   Title Patient will be independent with initial HEP and self-management strategies to improve functional outcomes    Time 2    Period Weeks    Status New    Target Date 07/01/20             PT Long Term Goals - 06/15/20 1207      PT LONG TERM GOAL #1   Title Patient will improve FOTO  score by 10% to indicate improvement in  functional outcomes    Time 6    Period Weeks    Status New    Target Date 07/29/20      PT LONG TERM GOAL #2   Title Patient will report at least 75% overall improvement in subjective complaint to indicate improvement in ability to perform ADLs.    Time 6    Period Weeks    Status New    Target Date 07/29/20      PT LONG TERM GOAL #3   Title Patient will be able to perform stand x 5 in < 15 seconds to demonstrate improvement in functional mobility and reduced risk for falls.    Time 6    Period Weeks    Status New    Target Date 07/29/20      PT LONG TERM GOAL #4   Title Patient will be able to ambulate at least 350 feet during 2MWT with LRAD to demonstrate improved ability to perform functional mobility and associated tasks.    Time 6    Period Weeks    Status New    Target Date 07/29/20                  Plan - 06/15/20 1202    Clinical Impression Statement Patient is a 70 y.o. female who presents to physical therapy with complaint of RT hip pain s/p RT THA on 05/23/20. Patient demonstrates decreased functional strength, ROM restriction, balance deficits and gait abnormalities which are likely contributing to symptoms of pain and are negatively impacting patient ability to perform ADLs and functional mobility tasks. Patient will benefit from skilled physical therapy services to address these deficits to reduce pain, improve level of function with ADLs, functional mobility tasks, and reduce risk for falls.    Personal Factors and Comorbidities Past/Current Experience    Examination-Activity Limitations Locomotion Level;Transfers;Stairs;Stand    Examination-Participation Restrictions Art gallery manager;Dorita Sciara    Stability/Clinical Decision Making Stable/Uncomplicated    Clinical Decision Making Low    Rehab Potential Good    PT Frequency 2x / week    PT Duration 6 weeks    PT Treatment/Interventions ADLs/Self  Care Home Management;Aquatic Therapy;Biofeedback;Cryotherapy;Electrical Stimulation;Gait training;Stair training;Functional mobility training;Orthotic Fit/Training;Manual techniques;Energy conservation;Splinting;Taping;Vasopneumatic Device;Therapeutic activities;Iontophoresis 4mg /ml Dexamethasone;Therapeutic exercise;Moist Heat;Traction;Balance training;Manual lymph drainage;Dry needling;Joint Manipulations;Spinal Manipulations;Compression bandaging;Visual/perceptual remediation/compensation;Scar mobilization;Neuromuscular re-education;Ultrasound;Parrafin;Fluidtherapy;Contrast Bath;Patient/family education;DME Instruction;Passive range of motion    PT Next Visit Plan Review goals. Progress HEP (added heel slides, patetient reports already doing heel raises, standing hip abduction/ extension). Progress standing LE strenght to tolerance (add sit/stands, standing knee flexion), add static balance when able and progress to gait training with LRAD.    PT Home Exercise Plan Eval: added heel slides (patient reports already doing heel raises, standing hip abduction/ extension).    Consulted and Agree with Plan of Care Patient           Patient will benefit from skilled therapeutic intervention in order to improve the following deficits and impairments:  Abnormal gait,Decreased range of motion,Decreased balance,Decreased mobility,Difficulty walking,Pain,Decreased strength  Visit Diagnosis: Pain in right hip  Other abnormalities of gait and mobility  Muscle weakness (generalized)     Problem List Patient Active Problem List   Diagnosis Date Noted  . History of revision of total replacement of right hip joint 05/23/2020  . Prediabetes 07/21/2019  . 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 Failed total hip arthroplasty ([place]) 10/01/2017  . Arthritis of right hip 03/19/2016  . Primary osteoarthritis of right hip  03/18/2016  . Hypokalemia 03/29/2014  . Arthralgia 02/11/2014  .  Routine general medical examination at a health care facility 03/28/2013  . Nonspecific abnormal findings on radiological and examination of lung field 03/27/2013  . Cough 03/11/2013  . Unspecified disorder of liver 03/09/2013  . Pyuria 03/09/2013  . Arthritis   . Ventral hernia 01/18/2012  . Encounter for long-term (current) use of other medications 07/05/2011  . HYPERGLYCEMIA 08/12/2008  . HYPERCHOLESTEROLEMIA 04/25/2007  . DIZZINESS AND GIDDINESS 04/25/2007  . Essential hypertension 01/03/2007  . ALLERGIC RHINITIS 01/03/2007    12:11 PM, 06/15/20 Josue Hector PT DPT  Physical Therapist with [facility]  [phone]   [facility] [address] [place], [place], [address] Phone: [phone]   Fax:  [phone]  Name: Angela Estrada MRN: [id number] Date of Birth: 05-26-1951

## 2020-06-20 ENCOUNTER — Ambulatory Visit (HOSPITAL_COMMUNITY): Payer: Medicare HMO | Admitting: Physical Therapy

## 2020-06-20 ENCOUNTER — Other Ambulatory Visit: Payer: Self-pay

## 2020-06-20 ENCOUNTER — Encounter (HOSPITAL_COMMUNITY): Payer: Self-pay | Admitting: Physical Therapy

## 2020-06-20 DIAGNOSIS — R2689 Other abnormalities of gait and mobility: Secondary | ICD-10-CM | POA: Diagnosis not present

## 2020-06-20 DIAGNOSIS — M25551 Pain in right hip: Secondary | ICD-10-CM

## 2020-06-20 DIAGNOSIS — M6281 Muscle weakness (generalized): Secondary | ICD-10-CM | POA: Diagnosis not present

## 2020-06-20 NOTE — Therapy (Signed)
[facility] [address] [place], [place], [address] Phone: [phone]   Fax:  [phone]  Physical Therapy Treatment  Patient Details  Name: Angela Estrada MRN: [id number] Date of Birth: 17-Apr-1951 Referring Provider (PT): Frederik Pear MD   Encounter Date: 06/20/2020   PT End of Session - 06/20/20 1143    Visit Number 2    Number of Visits 12    Date for PT Re-Evaluation 07/29/20    Authorization Type [employer]    Progress Note Due on Visit 10    PT Start Time 1115    PT Stop Time 1155    PT Time Calculation (min) 40 min    Equipment Utilized During Treatment Gait belt    Activity Tolerance Patient tolerated treatment well    Behavior During Therapy [facility] for tasks assessed/performed           Past Medical History:  Diagnosis Date  . Abdominal hernia    RLQ  . Allergy   . Arthritis   . Arthritis   . Borderline diabetes   . GERD (gastroesophageal reflux disease)   . HYPERCHOLESTEROLEMIA 04/25/2007   takes Pravastatin daily  . HYPERTENSION 01/03/2007   takes Losartan-HCTZ  and Amlodipine daily  . Hypokalemia    takes Potassium daily  . Joint pain   . Nocturia   . Pneumonia 2 yrs ago  . Primary osteoarthritis of shoulder    Right    Past Surgical History:  Procedure Laterality Date  . CESAREAN SECTION  25 years ago  . CHOLECYSTECTOMY    . COLONOSCOPY    . DILATION AND CURETTAGE OF UTERUS  35 years ago  . HERNIA REPAIR    . INCISIONAL HERNIA REPAIR  02/21/2012   Procedure: LAPAROSCOPIC INCISIONAL HERNIA;  Surgeon: Harl Bowie, MD;  Location: [place];  Service: General;  Laterality: N/A;  laparoscopic incisional hernia repair with mesh  . JOINT REPLACEMENT     bilateral knee replacements  . REPLACEMENT TOTAL KNEE BILATERAL  2010  . TOTAL HIP ARTHROPLASTY Right 03/19/2016   Procedure: RIGHT TOTAL HIP ARTHROPLASTY ANTERIOR APPROACH;  Surgeon: Frederik Pear, MD;  Location: [place];  Service: Orthopedics;   Laterality: Right;  . TOTAL HIP REVISION Right 10/02/2017   Procedure: TOTAL HIP REVISION POSTERIOR;  Surgeon: Frederik Pear, MD;  Location: [facility];  Service: Orthopedics;  Laterality: Right;  . TOTAL HIP REVISION Right 05/23/2020   Procedure: RIGHT TOTAL POSTERIOR HIP REVISION;  Surgeon: Frederik Pear, MD;  Location: [facility];  Service: Orthopedics;  Laterality: Right;  . TOTAL SHOULDER ARTHROPLASTY Right 12/19/2017   Procedure: RIGHT TOTAL SHOULDER ARTHROPLASTY;  Surgeon: Tania Ade, MD;  Location: [facility];  Service: Orthopedics;  Laterality: Right;  . TUBAL LIGATION      There were no vitals filed for this visit.   Subjective Assessment - 06/20/20 1120    Subjective Pt states that she did not have any difficulty with her HEP.   She is trying to walk a little more each day.    Limitations Sitting;Lifting;Standing;Walking;House hold activities    Currently in Pain? Yes    Pain Score 3     Pain Location Hip    Pain Orientation Right    Pain Descriptors / Indicators Sore    Pain Type Acute pain    Pain Onset 1 to 4 weeks ago    Pain Frequency Intermittent    Aggravating Factors  activyt    Pain Relieving Factors icing  [place] Adult PT Treatment/Exercise - 06/20/20 0001      Ambulation/Gait   Ambulation/Gait Yes    Ambulation Distance (Feet) 200 Feet    Assistive device Straight cane    Gait Pattern Step-through pattern    Ambulation Surface Level      Exercises   Exercises Knee/Hip      Knee/Hip Exercises: Stretches   Gastroc Stretch Limitations --      Knee/Hip Exercises: Standing   Heel Raises Both;10 reps    Heel Raises Limitations toe raises x 10   heel/toe raises done sitting as well   Forward Step Up 10 reps;Both    Functional Squat 10 reps    Rocker Board 2 minutes    Other Standing Knee Exercises semitandem stance, heel to MTP 2 rep B x 20"      Knee/Hip Exercises: Seated   Sit to Sand 10 reps                     PT Short Term Goals - 06/20/20 1153      PT SHORT TERM GOAL #1   Title Patient will be independent with initial HEP and self-management strategies to improve functional outcomes    Time 2    Period Weeks    Status On-going    Target Date 07/01/20             PT Long Term Goals - 06/20/20 1154      PT LONG TERM GOAL #1   Title Patient will improve FOTO score by 10% to indicate improvement in functional outcomes    Time 6    Period Weeks    Status On-going      PT LONG TERM GOAL #2   Title Patient will report at least 75% overall improvement in subjective complaint to indicate improvement in ability to perform ADLs.    Time 6    Period Weeks    Status On-going      PT LONG TERM GOAL #3   Title Patient will be able to perform stand x 5 in < 15 seconds to demonstrate improvement in functional mobility and reduced risk for falls.    Time 6    Period Weeks    Status On-going      PT LONG TERM GOAL #4   Title Patient will be able to ambulate at least 350 feet during 2MWT with LRAD to demonstrate improved ability to perform functional mobility and associated tasks.    Time 6    Period Weeks    Status On-going                 Plan - 06/20/20 1144    Clinical Impression Statement Evaluation and goals  reveiwed.  Pt progressed thru both strengthening and educated on gt training with cane.    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[facility]    Stability/Clinical Decision Making Stable/Uncomplicated    Rehab Potential Good    PT Frequency 2x / week    PT Duration 6 weeks    PT Treatment/Interventions ADLs/Self Care Home Management;Aquatic Therapy;Biofeedback;Cryotherapy;Electrical Stimulation;Gait training;Stair training;Functional mobility training;Orthotic Fit/Training;Manual  techniques;Energy conservation;Splinting;Taping;Vasopneumatic Device;Therapeutic activities;Iontophoresis 4mg /ml Dexamethasone;Therapeutic exercise;Moist Heat;Traction;Balance training;Manual lymph drainage;Dry needling;Joint Manipulations;Spinal Manipulations;Compression bandaging;Visual/perceptual remediation/compensation;Scar mobilization;Neuromuscular re-education;Ultrasound;Parrafin;Fluidtherapy;Contrast Bath;Patient/family education;DME Instruction;Passive range of motion    PT Next Visit Plan Begin forward lunge, lateral step up and normal tandem stance.    PT Home  Exercise Plan Eval: added heel slides (patient reports already doing heel raises, standing hip abduction/ extension). 1/10:  sit to stand, sitting heel raise/toe raise, functional squat, semitandem stance and forward step up.    Consulted and Agree with Plan of Care Patient           Patient will benefit from skilled therapeutic intervention in order to improve the following deficits and impairments:  Abnormal gait,Decreased range of motion,Decreased balance,Decreased mobility,Difficulty walking,Pain,Decreased strength  Visit Diagnosis: Pain in right hip  Other abnormalities of gait and mobility  Muscle weakness (generalized)     Problem List Patient Active Problem List   Diagnosis Date Noted  . History of revision of total replacement of right hip joint 05/23/2020  . Prediabetes 07/21/2019  . 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 Arthritis of right hip 03/19/2016  . Primary osteoarthritis of right hip 03/18/2016  . Hypokalemia 03/29/2014  . Arthralgia 02/11/2014  . Routine general medical examination at a health care facility 03/28/2013  . Nonspecific abnormal findings on radiological and examination of lung field 03/27/2013  . Cough 03/11/2013  . Unspecified disorder of liver 03/09/2013  . Pyuria 03/09/2013  . Arthritis   . Ventral  hernia 01/18/2012  . Encounter for long-term (current) use of other medications 07/05/2011  . HYPERGLYCEMIA 08/12/2008  . HYPERCHOLESTEROLEMIA 04/25/2007  . DIZZINESS AND GIDDINESS 04/25/2007  . Essential hypertension 01/03/2007  . ALLERGIC RHINITIS 01/03/2007    Rayetta Humphrey, PT CLT [phone] 06/20/2020, 12:08 PM  [facility] [address] [place], [place], [address] Phone: [phone]   Fax:  [phone]  Name: JASMA SEEVERS MRN: [id number] Date of Birth: 1950/06/15

## 2020-06-22 ENCOUNTER — Encounter (HOSPITAL_COMMUNITY): Payer: Self-pay | Admitting: Physical Therapy

## 2020-06-22 ENCOUNTER — Other Ambulatory Visit: Payer: Self-pay

## 2020-06-22 ENCOUNTER — Ambulatory Visit (HOSPITAL_COMMUNITY): Payer: Medicare HMO | Admitting: Physical Therapy

## 2020-06-22 DIAGNOSIS — M25551 Pain in right hip: Secondary | ICD-10-CM | POA: Diagnosis not present

## 2020-06-22 DIAGNOSIS — M6281 Muscle weakness (generalized): Secondary | ICD-10-CM

## 2020-06-22 DIAGNOSIS — R2689 Other abnormalities of gait and mobility: Secondary | ICD-10-CM

## 2020-06-22 NOTE — Therapy (Signed)
[facility] [address] [place], [place], [address] Phone: [phone]   Fax:  [phone]  Physical Therapy Treatment  Patient Details  Name: Angela Estrada MRN: [id number] Date of Birth: Jul 05, 1950 Referring Provider (PT): Frederik Pear MD   Encounter Date: 06/22/2020   PT End of Session - 06/22/20 1125    Visit Number 3    Number of Visits 12    Date for PT Re-Evaluation 07/29/20    Authorization Type [employer] Medicare    Progress Note Due on Visit 10    PT Start Time 1120    PT Stop Time 1158    PT Time Calculation (min) 38 min    Equipment Utilized During Treatment Gait belt    Activity Tolerance Patient tolerated treatment well    Behavior During Therapy WFL for tasks assessed/performed           Past Medical History:  Diagnosis Date  . Abdominal hernia    RLQ  . Allergy   . Arthritis   . Arthritis   . Borderline diabetes   . GERD (gastroesophageal reflux disease)   . HYPERCHOLESTEROLEMIA 04/25/2007   takes Pravastatin daily  . HYPERTENSION 01/03/2007   takes Losartan-HCTZ  and Amlodipine daily  . Hypokalemia    takes Potassium daily  . Joint pain   . Nocturia   . Pneumonia 2 yrs ago  . Primary osteoarthritis of shoulder    Right    Past Surgical History:  Procedure Laterality Date  . CESAREAN SECTION  25 years ago  . CHOLECYSTECTOMY    . COLONOSCOPY    . DILATION AND CURETTAGE OF UTERUS  35 years ago  . HERNIA REPAIR    . INCISIONAL HERNIA REPAIR  02/21/2012   Procedure: LAPAROSCOPIC INCISIONAL HERNIA;  Surgeon: Harl Bowie, MD;  Location: [facility];  Service: General;  Laterality: N/A;  laparoscopic incisional hernia repair with mesh  . JOINT REPLACEMENT     bilateral knee replacements  . REPLACEMENT TOTAL KNEE BILATERAL  2010  . TOTAL HIP ARTHROPLASTY Right 03/19/2016   Procedure: RIGHT TOTAL HIP ARTHROPLASTY ANTERIOR APPROACH;  Surgeon: Frederik Pear, MD;  Location: [place] [facility];  Service: Orthopedics;   Laterality: Right;  . TOTAL HIP REVISION Right 10/02/2017   Procedure: TOTAL HIP REVISION POSTERIOR;  Surgeon: Frederik Pear, MD;  Location: [facility] [facility];  Service: Orthopedics;  Laterality: Right;  . TOTAL HIP REVISION Right 05/23/2020   Procedure: RIGHT TOTAL POSTERIOR HIP REVISION;  Surgeon: Frederik Pear, MD;  Location: [facility];  Service: Orthopedics;  Laterality: Right;  . TOTAL SHOULDER ARTHROPLASTY Right 12/19/2017   Procedure: RIGHT TOTAL SHOULDER ARTHROPLASTY;  Surgeon: Tania Ade, MD;  Location[facility];  Service: Orthopedics;  Laterality: Right;  . TUBAL LIGATION      There were no vitals filed for this visit.   Subjective Assessment - 06/22/20 1124    Subjective Patient says she is doing well. Was a little sore after last visit but not bad.    Limitations Sitting;Lifting;Standing;Walking;House hold activities    Currently in Pain? Yes    Pain Score 2     Pain Location Hip    Pain Orientation Right    Pain Descriptors / Indicators Sore    Pain Type Surgical pain    Pain Onset 1 to 4 weeks ago    Pain Frequency Intermittent  [facility] Adult PT Treatment/Exercise - 06/22/20 0001      Knee/Hip Exercises: Standing   Heel Raises Both;2 sets;10 reps    Knee Flexion Both;2 sets;10 reps    Hip Flexion Both;1 set;20 reps    Hip Flexion Limitations marching    Hip Abduction Both;2 sets;10 reps    Forward Step Up Both;1 set;10 reps;Hand Hold: 1;Step Height: 4"    Gait Training 226 feet using SPC with cues for sequencing    Other Standing Knee Exercises semi tandem stance 3 x 30"      Knee/Hip Exercises: Seated   Sit to Sand 10 reps;without UE support                    PT Short Term Goals - 06/20/20 1153      PT SHORT TERM GOAL #1   Title Patient will be independent with initial HEP and self-management strategies to improve functional outcomes    Time 2    Period Weeks    Status On-going    Target Date 07/01/20              PT Long Term Goals - 06/20/20 1154      PT LONG TERM GOAL #1   Title Patient will improve FOTO score by 10% to indicate improvement in functional outcomes    Time 6    Period Weeks    Status On-going      PT LONG TERM GOAL #2   Title Patient will report at least 75% overall improvement in subjective complaint to indicate improvement in ability to perform ADLs.    Time 6    Period Weeks    Status On-going      PT LONG TERM GOAL #3   Title Patient will be able to perform stand x 5 in < 15 seconds to demonstrate improvement in functional mobility and reduced risk for falls.    Time 6    Period Weeks    Status On-going      PT LONG TERM GOAL #4   Title Patient will be able to ambulate at least 350 feet during 2MWT with LRAD to demonstrate improved ability to perform functional mobility and associated tasks.    Time 6    Period Weeks    Status On-going                 Plan - 06/22/20 1158    Clinical Impression Statement Patient progressing activity well. Added standing hip abduction and knee flexion for LE strength progression. Cued patient on reduced use of UEs for sit to stands and semi tandem balance. Patient tolerated these well. Patient able to increase ambulatory distance using SPC with minimal fatigue but shows continued signs of RLE weakness notably with ascending 4 inch steps. Patient will continue to benefit from skilled therapy services to progress LE strength and mobility to reduce pain and improve LOF with functional mobility.    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[facility]    Stability/Clinical Decision Making Stable/Uncomplicated    Rehab Potential Good    PT Frequency 2x / week    PT Duration 6 weeks    PT Treatment/Interventions ADLs/Self Care Home Management;Aquatic  Therapy;Biofeedback;Cryotherapy;Electrical Stimulation;Gait training;Stair training;Functional mobility training;Orthotic Fit/Training;Manual techniques;Energy conservation;Splinting;Taping;Vasopneumatic Device;Therapeutic activities;Iontophoresis 4mg /ml Dexamethasone;Therapeutic exercise;Moist Heat;Traction;Balance training;Manual lymph drainage;Dry needling;Joint Manipulations;Spinal Manipulations;Compression bandaging;Visual/perceptual remediation/compensation;Scar mobilization;Neuromuscular re-education;Ultrasound;Parrafin;Fluidtherapy;Contrast Bath;Patient/family education;DME Instruction;Passive range of motion  PT Next Visit Plan Continue to progress LE strength, hip mobility, balance and gait training. Try sidestepping, lateral step ups next visit. Try increasing step height when ready.    PT Home Exercise Plan Eval: added heel slides (patient reports already doing heel raises, standing hip abduction/ extension). 1/10:  sit to stand, sitting heel raise/toe raise, functional squat, semitandem stance and forward step up.    Consulted and Agree with Plan of Care Patient           Patient will benefit from skilled therapeutic intervention in order to improve the following deficits and impairments:  Abnormal gait,Decreased range of motion,Decreased balance,Decreased mobility,Difficulty walking,Pain,Decreased strength  Visit Diagnosis: Pain in right hip  Other abnormalities of gait and mobility  Muscle weakness (generalized)     Problem List Patient Active Problem List   Diagnosis Date Noted  . History of revision of total replacement of right hip joint 05/23/2020  . Prediabetes 07/21/2019  . 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 Arthritis of right hip 03/19/2016  . Primary osteoarthritis of right hip 03/18/2016  . Hypokalemia 03/29/2014  . Arthralgia 02/11/2014  . Routine general medical examination at a  health care facility 03/28/2013  . Nonspecific abnormal findings on radiological and examination of lung field 03/27/2013  . Cough 03/11/2013  . Unspecified disorder of liver 03/09/2013  . Pyuria 03/09/2013  . Arthritis   . Ventral hernia 01/18/2012  . Encounter for long-term (current) use of other medications 07/05/2011  . HYPERGLYCEMIA 08/12/2008  . HYPERCHOLESTEROLEMIA 04/25/2007  . DIZZINESS AND GIDDINESS 04/25/2007  . Essential hypertension 01/03/2007  . ALLERGIC RHINITIS 01/03/2007   12:01 PM, 06/22/20 Josue Hector PT DPT  Physical Therapist with [facility]  [phone]   [facility] [address] [place], [place], [address] Phone: [phone]   Fax:  [phone]  Name: Angela Estrada MRN: [id number] Date of Birth: 1951-04-05

## 2020-06-27 ENCOUNTER — Ambulatory Visit (HOSPITAL_COMMUNITY): Payer: Medicare HMO

## 2020-06-28 ENCOUNTER — Other Ambulatory Visit: Payer: Self-pay | Admitting: Registered Nurse

## 2020-06-29 ENCOUNTER — Ambulatory Visit (HOSPITAL_COMMUNITY): Payer: Medicare HMO

## 2020-06-29 ENCOUNTER — Other Ambulatory Visit: Payer: Self-pay

## 2020-06-29 ENCOUNTER — Encounter (HOSPITAL_COMMUNITY): Payer: Self-pay

## 2020-06-29 DIAGNOSIS — R2689 Other abnormalities of gait and mobility: Secondary | ICD-10-CM | POA: Diagnosis not present

## 2020-06-29 DIAGNOSIS — M6281 Muscle weakness (generalized): Secondary | ICD-10-CM | POA: Diagnosis not present

## 2020-06-29 DIAGNOSIS — M25551 Pain in right hip: Secondary | ICD-10-CM | POA: Diagnosis not present

## 2020-06-29 NOTE — Therapy (Signed)
[facility] [address] [place], [place], [address] Phone: [phone]   Fax:  [phone]  Physical Therapy Treatment  Patient Details  Name: Angela Estrada MRN: [id number] Date of Birth: July 05, 1950 Referring Provider (PT): Frederik Pear MD   Encounter Date: 06/29/2020   PT End of Session - 06/29/20 1110    Visit Number 4    Number of Visits 12    Date for PT Re-Evaluation 07/29/20    Authorization Type [employer]    Progress Note Due on Visit 10    PT Start Time 1111    PT Stop Time 1153    PT Time Calculation (min) 42 min    Equipment Utilized During Treatment Gait belt    Activity Tolerance Patient tolerated treatment well    Behavior During Therapy [facility] for tasks assessed/performed           Past Medical History:  Diagnosis Date  . Abdominal hernia    RLQ  . Allergy   . Arthritis   . Arthritis   . Borderline diabetes   . GERD (gastroesophageal reflux disease)   . HYPERCHOLESTEROLEMIA 04/25/2007   takes Pravastatin daily  . HYPERTENSION 01/03/2007   takes Losartan-HCTZ  and Amlodipine daily  . Hypokalemia    takes Potassium daily  . Joint pain   . Nocturia   . Pneumonia 2 yrs ago  . Primary osteoarthritis of shoulder    Right    Past Surgical History:  Procedure Laterality Date  . CESAREAN SECTION  25 years ago  . CHOLECYSTECTOMY    . COLONOSCOPY    . DILATION AND CURETTAGE OF UTERUS  35 years ago  . HERNIA REPAIR    . INCISIONAL HERNIA REPAIR  02/21/2012   Procedure: LAPAROSCOPIC INCISIONAL HERNIA;  Surgeon: Harl Bowie, MD;  Location: [facility];  Service: General;  Laterality: N/A;  laparoscopic incisional hernia repair with mesh  . JOINT REPLACEMENT     bilateral knee replacements  . REPLACEMENT TOTAL KNEE BILATERAL  2010  . TOTAL HIP ARTHROPLASTY Right 03/19/2016   Procedure: RIGHT TOTAL HIP ARTHROPLASTY ANTERIOR APPROACH;  Surgeon: Frederik Pear, MD;  Location: [facility];  Service: Orthopedics;   Laterality: Right;  . TOTAL HIP REVISION Right 10/02/2017   Procedure: TOTAL HIP REVISION POSTERIOR;  Surgeon: Frederik Pear, MD;  Location: [facility];  Service: Orthopedics;  Laterality: Right;  . TOTAL HIP REVISION Right 05/23/2020   Procedure: RIGHT TOTAL POSTERIOR HIP REVISION;  Surgeon: Frederik Pear, MD;  Location: [facility];  Service: Orthopedics;  Laterality: Right;  . TOTAL SHOULDER ARTHROPLASTY Right 12/19/2017   Procedure: RIGHT TOTAL SHOULDER ARTHROPLASTY;  Surgeon: Tania Ade, MD;  Location: [facility];  Service: Orthopedics;  Laterality: Right;  . TUBAL LIGATION      There were no vitals filed for this visit.   Subjective Assessment - 06/29/20 1119    Subjective Patient says she is doing well and feeling that the RLE is getting stronger with less pain present and she reports increased walking in the house    Limitations Sitting;Lifting;Standing;Walking;House hold activities    Pain Onset 1 to 4 weeks ago                             [facility] Adult PT Treatment/Exercise - 06/29/20 0001      Knee/Hip Exercises: Standing   Heel Raises Both;3 sets;10 reps   with ball between heels for form  Functional Squat --    SLS with 4'' step height performing LLE lift-offs for RLE loading response    Gait Training staggered stance (left foot forward) with overhead reach to facilitate hip extension at terminal stance 3x10. Multiple LOB    Other Standing Knee Exercises static balance on airex pad 2x30 sec wide/narrow stance eyes open/closed    Other Standing Knee Exercises sidestepping along wall 2x2 min      Knee/Hip Exercises: Seated   Long Arc Quad Strengthening;Both;3 sets;10 reps    Long Arc Quad Weight 3 lbs.    Ball Squeeze 3x10, 2 sec hold    Sit to Starbucks CorporationSand 5 reps;without UE support   5 sets from 24" seat height with mirror for LE alignment                 PT Education - 06/29/20 1130    Education Details education on HEP progression and new additions for RLE  strength/balance    Person(s) Educated Patient    Methods Explanation;Demonstration    Comprehension Verbalized understanding;Returned demonstration            PT Short Term Goals - 06/20/20 1153      PT SHORT TERM GOAL #1   Title Patient will be independent with initial HEP and self-management strategies to improve functional outcomes    Time 2    Period Weeks    Status On-going    Target Date 07/01/20             PT Long Term Goals - 06/20/20 1154      PT LONG TERM GOAL #1   Title Patient will improve FOTO score by 10% to indicate improvement in functional outcomes    Time 6    Period Weeks    Status On-going      PT LONG TERM GOAL #2   Title Patient will report at least 75% overall improvement in subjective complaint to indicate improvement in ability to perform ADLs.    Time 6    Period Weeks    Status On-going      PT LONG TERM GOAL #3   Title Patient will be able to perform stand x 5 in < 15 seconds to demonstrate improvement in functional mobility and reduced risk for falls.    Time 6    Period Weeks    Status On-going      PT LONG TERM GOAL #4   Title Patient will be able to ambulate at least 350 feet during 2MWT with LRAD to demonstrate improved ability to perform functional mobility and associated tasks.    Time 6    Period Weeks    Status On-going                 Plan - 06/29/20 1201    Clinical Impression Statement Pt progressing with POC details and demonstrates pronounced postural sway with static standing on compliant surfaces with increase when eyes closed.  Difficulty with maintaining narrow BOS during split stance requiring frequent cues for right hip extension recruitment.  Continues to demo decrease in right lateral weight shift in stance during gait.  Continued tx sessions to focus on quad/hip extension strength    Personal Factors and Comorbidities Past/Current Experience    Examination-Activity Limitations Locomotion  Level;Transfers;Stairs;Stand    Examination-Participation Restrictions Psychologist, forensicCommunity Activity;Cleaning;Pinc[facility] BadderYard Advanced Surgical Care Of Baton Rouge LLCWork;Laundry    Stability/Clinical Decision Making Stable/Uncomplicated    Rehab Potential Good    PT Frequency 2x / week    PT Duration  6 weeks    PT Treatment/Interventions ADLs/Self Care Home Management;Aquatic Therapy;Biofeedback;Cryotherapy;Electrical Stimulation;Gait training;Stair training;Functional mobility training;Orthotic Fit/Training;Manual techniques;Energy conservation;Splinting;Taping;Vasopneumatic Device;Therapeutic activities;Iontophoresis 4mg /ml Dexamethasone;Therapeutic exercise;Moist Heat;Traction;Balance training;Manual lymph drainage;Dry needling;Joint Manipulations;Spinal Manipulations;Compression bandaging;Visual/perceptual remediation/compensation;Scar mobilization;Neuromuscular re-education;Ultrasound;Parrafin;Fluidtherapy;Contrast Bath;Patient/family education;DME Instruction;Passive range of motion    PT Next Visit Plan Continue to progress LE strength, hip mobility, balance and gait training. Try sidestepping, lateral step ups next visit. Try increasing step height when ready. Continue with quad/hip extension coordination/strengthening    PT Home Exercise Plan Eval: added heel slides (patient reports already doing heel raises, standing hip abduction/ extension). 1/10:  sit to stand, sitting heel raise/toe raise, functional squat, semitandem stance and forward step up. Staggered stance with UE flexion overhead    Consulted and Agree with Plan of Care Patient           Patient will benefit from skilled therapeutic intervention in order to improve the following deficits and impairments:  Abnormal gait,Decreased range of motion,Decreased balance,Decreased mobility,Difficulty walking,Pain,Decreased strength  Visit Diagnosis: Pain in right hip  Other abnormalities of gait and mobility  Muscle weakness (generalized)     Problem List Patient Active Problem List    Diagnosis Date Noted  . History of revision of total replacement of right hip joint 05/23/2020  . Prediabetes 07/21/2019  . 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 Arthritis of right hip 03/19/2016  . Primary osteoarthritis of right hip 03/18/2016  . Hypokalemia 03/29/2014  . Arthralgia 02/11/2014  . Routine general medical examination at a health care facility 03/28/2013  . Nonspecific abnormal findings on radiological and examination of lung field 03/27/2013  . Cough 03/11/2013  . Unspecified disorder of liver 03/09/2013  . Pyuria 03/09/2013  . Arthritis   . Ventral hernia 01/18/2012  . Encounter for long-term (current) use of other medications 07/05/2011  . HYPERGLYCEMIA 08/12/2008  . HYPERCHOLESTEROLEMIA 04/25/2007  . DIZZINESS AND GIDDINESS 04/25/2007  . Essential hypertension 01/03/2007  . ALLERGIC RHINITIS 01/03/2007    12:05 PM, 06/29/20 M. Sherlyn Lees, PT, DPT Physical Therapist- [facility] Office Number: [phone]  [facility] [address] [place], [place], [address] Phone: [phone]   Fax:  [phone]  Name: Angela Estrada MRN: [id number] Date of Birth: 06-25-1950

## 2020-06-29 NOTE — Patient Instructions (Signed)
Step your left foot forward, with the weight on your right leg (keep your buttocks tight on the right) and slowly lift your arms overhead.  Do this by the wall for safety and keep your walker in front

## 2020-07-04 ENCOUNTER — Ambulatory Visit (HOSPITAL_COMMUNITY): Payer: Medicare HMO

## 2020-07-05 ENCOUNTER — Encounter (HOSPITAL_COMMUNITY): Payer: Medicare HMO | Admitting: Physical Therapy

## 2020-07-05 DIAGNOSIS — Z471 Aftercare following joint replacement surgery: Secondary | ICD-10-CM | POA: Diagnosis not present

## 2020-07-05 DIAGNOSIS — Z96641 Presence of right artificial hip joint: Secondary | ICD-10-CM | POA: Diagnosis not present

## 2020-07-06 ENCOUNTER — Encounter (HOSPITAL_COMMUNITY): Payer: Self-pay | Admitting: Physical Therapy

## 2020-07-06 ENCOUNTER — Ambulatory Visit (HOSPITAL_COMMUNITY): Payer: Medicare HMO | Admitting: Physical Therapy

## 2020-07-06 ENCOUNTER — Other Ambulatory Visit: Payer: Self-pay

## 2020-07-06 DIAGNOSIS — M6281 Muscle weakness (generalized): Secondary | ICD-10-CM

## 2020-07-06 DIAGNOSIS — M25551 Pain in right hip: Secondary | ICD-10-CM | POA: Diagnosis not present

## 2020-07-06 DIAGNOSIS — R2689 Other abnormalities of gait and mobility: Secondary | ICD-10-CM

## 2020-07-06 NOTE — Therapy (Signed)
[facility] [address] [place], [place], [address] Phone: [phone]   Fax:  [phone]  Physical Therapy Treatment  Patient Details  Name: Angela Estrada MRN: [id number] Date of Birth: 23-Mar-1951 Referring Provider (PT): Frederik Pear MD   Encounter Date: 07/06/2020   PT End of Session - 07/06/20 1128    Visit Number 5    Number of Visits 12    Date for PT Re-Evaluation 07/29/20    Authorization Type [employer] Medicare    Progress Note Due on Visit 10    PT Start Time 1121    PT Stop Time 1159    PT Time Calculation (min) 38 min    Equipment Utilized During Treatment Gait belt    Activity Tolerance Patient tolerated treatment well    Behavior During Therapy WFL for tasks assessed/performed           Past Medical History:  Diagnosis Date  . Abdominal hernia    RLQ  . Allergy   . Arthritis   . Arthritis   . Borderline diabetes   . GERD (gastroesophageal reflux disease)   . HYPERCHOLESTEROLEMIA 04/25/2007   takes Pravastatin daily  . HYPERTENSION 01/03/2007   takes Losartan-HCTZ  and Amlodipine daily  . Hypokalemia    takes Potassium daily  . Joint pain   . Nocturia   . Pneumonia 2 yrs ago  . Primary osteoarthritis of shoulder    Right    Past Surgical History:  Procedure Laterality Date  . CESAREAN SECTION  25 years ago  . CHOLECYSTECTOMY    . COLONOSCOPY    . DILATION AND CURETTAGE OF UTERUS  35 years ago  . HERNIA REPAIR    . INCISIONAL HERNIA REPAIR  02/21/2012   Procedure: LAPAROSCOPIC INCISIONAL HERNIA;  Surgeon: Harl Bowie, MD;  Location: [facility];  Service: General;  Laterality: N/A;  laparoscopic incisional hernia repair with mesh  . JOINT REPLACEMENT     bilateral knee replacements  . REPLACEMENT TOTAL KNEE BILATERAL  2010  . TOTAL HIP ARTHROPLASTY Right 03/19/2016   Procedure: RIGHT TOTAL HIP ARTHROPLASTY ANTERIOR APPROACH;  Surgeon: Frederik Pear, MD;  Location: [facility];  Service: Orthopedics;   Laterality: Right;  . TOTAL HIP REVISION Right 10/02/2017   Procedure: TOTAL HIP REVISION POSTERIOR;  Surgeon: Frederik Pear, MD;  Location: [facility];  Service: Orthopedics;  Laterality: Right;  . TOTAL HIP REVISION Right 05/23/2020   Procedure: RIGHT TOTAL POSTERIOR HIP REVISION;  Surgeon: Frederik Pear, MD;  Location[facility];  Service: Orthopedics;  Laterality: Right;  . TOTAL SHOULDER ARTHROPLASTY Right 12/19/2017   Procedure: RIGHT TOTAL SHOULDER ARTHROPLASTY;  Surgeon: Tania Ade, MD;  Location[facility];  Service: Orthopedics;  Laterality: Right;  . TUBAL LIGATION      There were no vitals filed for this visit.   Subjective Assessment - 07/06/20 1128    Subjective Patient says she is doing well. She says she had a checkup with MD yesterday. They say she is doing good, x-rays look good. She is to start using walker less.    Limitations Sitting;Lifting;Standing;Walking;House hold activities    Currently in Pain? No/denies    Pain Onset 1 to 4 weeks ago                             [facility] Adult PT Treatment/Exercise - 07/06/20 0001      Knee/Hip Exercises: Standing   Heel Raises  Both;2 sets;10 reps    Hip Abduction Both;2 sets;10 reps    Abduction Limitations RTB    Hip Extension Both;2 sets;10 reps    Extension Limitations RTB    Forward Step Up Both;1 set;15 reps;Hand Hold: 2;Step Height: 4"    Gait Training 450 feet with SPC, cues fro RT heel strike    Other Standing Knee Exercises semi tandem stance 3 x 30" solid floor    Other Standing Knee Exercises sidesteeping at blue line 1 x down at end of session, difficulty due to fatigue      Knee/Hip Exercises: Seated   Sit to Sand 1 set;10 reps;without UE support                    PT Short Term Goals - 06/20/20 1153      PT SHORT TERM GOAL #1   Title Patient will be independent with initial HEP and self-management strategies to improve functional outcomes    Time 2    Period Weeks    Status  On-going    Target Date 07/01/20             PT Long Term Goals - 06/20/20 1154      PT LONG TERM GOAL #1   Title Patient will improve FOTO score by 10% to indicate improvement in functional outcomes    Time 6    Period Weeks    Status On-going      PT LONG TERM GOAL #2   Title Patient will report at least 75% overall improvement in subjective complaint to indicate improvement in ability to perform ADLs.    Time 6    Period Weeks    Status On-going      PT LONG TERM GOAL #3   Title Patient will be able to perform stand x 5 in < 15 seconds to demonstrate improvement in functional mobility and reduced risk for falls.    Time 6    Period Weeks    Status On-going      PT LONG TERM GOAL #4   Title Patient will be able to ambulate at least 350 feet during 2MWT with LRAD to demonstrate improved ability to perform functional mobility and associated tasks.    Time 6    Period Weeks    Status On-going                 Plan - 07/06/20 1221    Clinical Impression Statement Patient is progressing well. She shows much improvement with ambulating using SPC, but continues to limited by RLE weakness. This is especially evident with ascending stairs, as she need both hands to support herself. She does fairly well with static balance. Added band resisted hip abduction and extension to improve hip strength for stair ambulation. Patient also needed cues to take larger step with RLE and land with heel strike. Patient will continue to benefit from skilled therapy services to progress strength and balance for improved functional mobility.    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[facility]    Stability/Clinical Decision Making Stable/Uncomplicated    Rehab Potential Good    PT Frequency 2x / week    PT Duration 6 weeks     PT Treatment/Interventions ADLs/Self Care Home Management;Aquatic Therapy;Biofeedback;Cryotherapy;Electrical Stimulation;Gait training;Stair training;Functional mobility training;Orthotic Fit/Training;Manual techniques;Energy conservation;Splinting;Taping;Vasopneumatic Device;Therapeutic activities;Iontophoresis 4mg /ml Dexamethasone;Therapeutic exercise;Moist Heat;Traction;Balance training;Manual lymph drainage;Dry needling;Joint Manipulations;Spinal Manipulations;Compression bandaging;Visual/perceptual remediation/compensation;Scar mobilization;Neuromuscular  re-education;Ultrasound;Parrafin;Fluidtherapy;Contrast Bath;Patient/family education;DME Instruction;Passive range of motion    PT Next Visit Plan Continue to progress LE strength, hip mobility, balance and gait training. Try sidestepping, lateral step ups next visit. Try increasing step height when ready. Continue with quad/hip extension coordination/strengthening    PT Home Exercise Plan Eval: added heel slides (patient reports already doing heel raises, standing hip abduction/ extension). 1/10:  sit to stand, sitting heel raise/toe raise, functional squat, semitandem stance and forward step up. Staggered stance with UE flexion overhead    Consulted and Agree with Plan of Care Patient           Patient will benefit from skilled therapeutic intervention in order to improve the following deficits and impairments:  Abnormal gait,Decreased range of motion,Decreased balance,Decreased mobility,Difficulty walking,Pain,Decreased strength  Visit Diagnosis: Pain in right hip  Other abnormalities of gait and mobility  Muscle weakness (generalized)     Problem List Patient Active Problem List   Diagnosis Date Noted  . History of revision of total replacement of right hip joint 05/23/2020  . Prediabetes 07/21/2019  . 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 Arthritis of  right hip 03/19/2016  . Primary osteoarthritis of right hip 03/18/2016  . Hypokalemia 03/29/2014  . Arthralgia 02/11/2014  . Routine general medical examination at a health care facility 03/28/2013  . Nonspecific abnormal findings on radiological and examination of lung field 03/27/2013  . Cough 03/11/2013  . Unspecified disorder of liver 03/09/2013  . Pyuria 03/09/2013  . Arthritis   . Ventral hernia 01/18/2012  . Encounter for long-term (current) use of other medications 07/05/2011  . HYPERGLYCEMIA 08/12/2008  . HYPERCHOLESTEROLEMIA 04/25/2007  . DIZZINESS AND GIDDINESS 04/25/2007  . Essential hypertension 01/03/2007  . ALLERGIC RHINITIS 01/03/2007   12:24 PM, 07/06/20 Josue Hector PT DPT  Physical Therapist with [facility]  [phone]   [facility] [address] [place], [place], [address] Phone: [phone]   Fax:  [phone]  Name: Angela Estrada MRN: [id number] Date of Birth: September 09, 1950

## 2020-07-11 ENCOUNTER — Encounter (HOSPITAL_COMMUNITY): Payer: Self-pay | Admitting: Physical Therapy

## 2020-07-11 ENCOUNTER — Ambulatory Visit (HOSPITAL_COMMUNITY): Payer: Medicare HMO | Admitting: Physical Therapy

## 2020-07-11 ENCOUNTER — Other Ambulatory Visit: Payer: Self-pay

## 2020-07-11 DIAGNOSIS — M25551 Pain in right hip: Secondary | ICD-10-CM | POA: Diagnosis not present

## 2020-07-11 DIAGNOSIS — R2689 Other abnormalities of gait and mobility: Secondary | ICD-10-CM

## 2020-07-11 DIAGNOSIS — M6281 Muscle weakness (generalized): Secondary | ICD-10-CM | POA: Diagnosis not present

## 2020-07-11 NOTE — Therapy (Signed)
[place], [place], [address] Phone: [phone]   Fax:  [phone]  Physical Therapy Treatment  Patient Details  Name: Angela Estrada MRN: [id number] Date of Birth: July 29, 1950 Referring Provider (PT): Frederik Pear MD   Encounter Date: 07/11/2020   PT End of Session - 07/11/20 1127    Visit Number 6    Number of Visits 12    Date for PT Re-Evaluation 07/29/20    Authorization Type [employer]    Progress Note Due on Visit 10    PT Start Time 1120    PT Stop Time 1200    PT Time Calculation (min) 40 min    Equipment Utilized During Treatment Gait belt    Activity Tolerance Patient tolerated treatment well    Behavior During Therapy [facility] for tasks assessed/performed           Past Medical History:  Diagnosis Date  . Abdominal hernia    RLQ  . Allergy   . Arthritis   . Arthritis   . Borderline diabetes   . GERD (gastroesophageal reflux disease)   . HYPERCHOLESTEROLEMIA 04/25/2007   takes Pravastatin daily  . HYPERTENSION 01/03/2007   takes Losartan-HCTZ  and Amlodipine daily  . Hypokalemia    takes Potassium daily  . Joint pain   . Nocturia   . Pneumonia 2 yrs ago  . Primary osteoarthritis of shoulder    Right    Past Surgical History:  Procedure Laterality Date  . CESAREAN SECTION  25 years ago  . CHOLECYSTECTOMY    . COLONOSCOPY    . DILATION AND CURETTAGE OF UTERUS  35 years ago  . HERNIA REPAIR    . INCISIONAL HERNIA REPAIR  02/21/2012   Procedure: LAPAROSCOPIC INCISIONAL HERNIA;  Surgeon: Harl Bowie, MD;  Location: [place];  Service: General;  Laterality: N/A;  laparoscopic incisional hernia repair with mesh  . JOINT REPLACEMENT     bilateral knee replacements  . REPLACEMENT TOTAL KNEE BILATERAL  2010  . TOTAL HIP ARTHROPLASTY Right 03/19/2016   Procedure: RIGHT TOTAL HIP ARTHROPLASTY ANTERIOR APPROACH;  Surgeon: Frederik Pear, MD;  Location: [place];  Service: Orthopedics;   Laterality: Right;  . TOTAL HIP REVISION Right 10/02/2017   Procedure: TOTAL HIP REVISION POSTERIOR;  Surgeon: Frederik Pear, MD;  Location: [place];  Service: Orthopedics;  Laterality: Right;  . TOTAL HIP REVISION Right 05/23/2020   Procedure: RIGHT TOTAL POSTERIOR HIP REVISION;  Surgeon: Frederik Pear, MD;  Location: [facility];  Service: Orthopedics;  Laterality: Right;  . TOTAL SHOULDER ARTHROPLASTY Right 12/19/2017   Procedure: RIGHT TOTAL SHOULDER ARTHROPLASTY;  Surgeon: Tania Ade, MD;  Location: [facility];  Service: Orthopedics;  Laterality: Right;  . TUBAL LIGATION      There were no vitals filed for this visit.   Subjective Assessment - 07/11/20 1124    Subjective Patient reports without complaint. She is doing well, walking more with cane. Did have some muscle soreness above hip from HEP exercises. No pain currently.    Limitations Sitting;Lifting;Standing;Walking;House hold activities    Currently in Pain? No/denies    Pain Onset 1 to 4 weeks ago                             [facility] Adult PT Treatment/Exercise - 07/11/20 0001      Knee/Hip Exercises: Standing   Heel Raises Both;2 sets;10 reps  Heel Raises Limitations toe raises 2 x 10    Hip Abduction Both;2 sets;10 reps    Abduction Limitations RTB    Hip Extension Both;2 sets;10 reps    Extension Limitations RTB    Lateral Step Up Both;1 set;10 reps;Hand Hold: 2;Step Height: 4"    Forward Step Up Both;1 set;15 reps;Hand Hold: 2;Step Height: 4"    SLS 2 x 15 second holds HHA x 2    Gait Training 226 ft with SPC    Other Standing Knee Exercises semi tandem stance 3 x 30" on foam      Knee/Hip Exercises: Seated   Sit to Sand 1 set;10 reps;without UE support                    PT Short Term Goals - 06/20/20 1153      PT SHORT TERM GOAL #1   Title Patient will be independent with initial HEP and self-management strategies to improve functional outcomes    Time 2    Period Weeks    Status  On-going    Target Date 07/01/20             PT Long Term Goals - 06/20/20 1154      PT LONG TERM GOAL #1   Title Patient will improve FOTO score by 10% to indicate improvement in functional outcomes    Time 6    Period Weeks    Status On-going      PT LONG TERM GOAL #2   Title Patient will report at least 75% overall improvement in subjective complaint to indicate improvement in ability to perform ADLs.    Time 6    Period Weeks    Status On-going      PT LONG TERM GOAL #3   Title Patient will be able to perform stand x 5 in < 15 seconds to demonstrate improvement in functional mobility and reduced risk for falls.    Time 6    Period Weeks    Status On-going      PT LONG TERM GOAL #4   Title Patient will be able to ambulate at least 350 feet during 2MWT with LRAD to demonstrate improved ability to perform functional mobility and associated tasks.    Time 6    Period Weeks    Status On-going                 Plan - 07/11/20 1201    Clinical Impression Statement Patient tolerated ther ex progressions well today. Added lateral step ups for LE strength progression. Patient shows steady ambulation using SPC but continues to have decreased RLE stance time, likely due to ongoing weakness. Added supported single leg stand holds to help with this. Patient issued HEP handout. Patient will continue to benefit from skilled therapy services to progress strength and balance for improved functional mobility.    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    Stability/Clinical Decision Making Stable/Uncomplicated    Rehab Potential Good    PT Frequency 2x / week    PT Duration 6 weeks    PT Treatment/Interventions ADLs/Self Care Home Management;Aquatic Therapy;Biofeedback;Cryotherapy;Electrical  Stimulation;Gait training;Stair training;Functional mobility training;Orthotic Fit/Training;Manual techniques;Energy conservation;Splinting;Taping;Vasopneumatic Device;Therapeutic activities;Iontophoresis 4mg /ml Dexamethasone;Therapeutic exercise;Moist Heat;Traction;Balance training;Manual lymph drainage;Dry needling;Joint Manipulations;Spinal Manipulations;Compression bandaging;Visual/perceptual remediation/compensation;Scar mobilization;Neuromuscular re-education;Ultrasound;Parrafin;Fluidtherapy;Contrast Bath;Patient/family education;DME Instruction;Passive range of motion    PT Next Visit Plan Continue to progress LE strength,  hip mobility, balance and gait training. Try sidestepping, increasing step height when ready. Continue with quad/hip extension coordination/strengthening    PT Home Exercise Plan Eval: added heel slides (patient reports already doing heel raises, standing hip abduction/ extension). 1/10:  sit to stand, sitting heel raise/toe raise, functional squat, semitandem stance and forward step up. Staggered stance with UE flexion overhead 1/31 supported single leg stance    Consulted and Agree with Plan of Care Patient           Patient will benefit from skilled therapeutic intervention in order to improve the following deficits and impairments:  Abnormal gait,Decreased range of motion,Decreased balance,Decreased mobility,Difficulty walking,Pain,Decreased strength  Visit Diagnosis: Pain in right hip  Other abnormalities of gait and mobility  Muscle weakness (generalized)     Problem List Patient Active Problem List   Diagnosis Date Noted  . History of revision of total replacement of right hip joint 05/23/2020  . Prediabetes 07/21/2019  . 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 Arthritis of right hip 03/19/2016  . Primary osteoarthritis of right hip 03/18/2016  . Hypokalemia 03/29/2014  . Arthralgia  02/11/2014  . Routine general medical examination at a health care facility 03/28/2013  . Nonspecific abnormal findings on radiological and examination of lung field 03/27/2013  . Cough 03/11/2013  . Unspecified disorder of liver 03/09/2013  . Pyuria 03/09/2013  . Arthritis   . Ventral hernia 01/18/2012  . Encounter for long-term (current) use of other medications 07/05/2011  . HYPERGLYCEMIA 08/12/2008  . HYPERCHOLESTEROLEMIA 04/25/2007  . DIZZINESS AND GIDDINESS 04/25/2007  . Essential hypertension 01/03/2007  . ALLERGIC RHINITIS 01/03/2007    12:06 PM, 07/11/20 Josue Hector PT DPT  Physical Therapist with [facility]  [phone]   [facility] [address] [place], [place], [address] Phone: [phone]   Fax:  [phone]  Name: Angela Estrada MRN: [id number] Date of Birth: 05-Jun-1951

## 2020-07-11 NOTE — Patient Instructions (Signed)
Access Code: [email or web address] URL: [email or web address] Date: 07/11/2020 Prepared by: Josue Hector  Exercises Standing Single Leg Stance with Counter Support - 2-3 x daily - 7 x weekly - 1 sets - 3 reps - 10 second hold

## 2020-07-13 ENCOUNTER — Other Ambulatory Visit: Payer: Self-pay

## 2020-07-13 ENCOUNTER — Ambulatory Visit (HOSPITAL_COMMUNITY): Payer: Medicare HMO | Attending: Orthopedic Surgery | Admitting: Physical Therapy

## 2020-07-13 ENCOUNTER — Encounter (HOSPITAL_COMMUNITY): Payer: Self-pay | Admitting: Physical Therapy

## 2020-07-13 DIAGNOSIS — R2689 Other abnormalities of gait and mobility: Secondary | ICD-10-CM | POA: Insufficient documentation

## 2020-07-13 DIAGNOSIS — M6281 Muscle weakness (generalized): Secondary | ICD-10-CM | POA: Diagnosis not present

## 2020-07-13 DIAGNOSIS — M25551 Pain in right hip: Secondary | ICD-10-CM | POA: Diagnosis not present

## 2020-07-13 NOTE — Therapy (Signed)
[place] [address] [place], [place], [address] Phone: [phone]   Fax:  [phone]  Physical Therapy Treatment  Patient Details  Name: Angela Estrada MRN: [id number] Date of Birth: 1950-11-06 Referring Provider (PT): Frederik Pear MD   Encounter Date: 07/13/2020   PT End of Session - 07/13/20 1125    Visit Number 7    Number of Visits 12    Date for PT Re-Evaluation 07/29/20    Authorization Type [employer] Medicare    Progress Note Due on Visit 10    PT Start Time 1120    PT Stop Time 1200    PT Time Calculation (min) 40 min    Equipment Utilized During Treatment Gait belt    Activity Tolerance Patient tolerated treatment well    Behavior During Therapy [facility] for tasks assessed/performed           Past Medical History:  Diagnosis Date  . Abdominal hernia    RLQ  . Allergy   . Arthritis   . Arthritis   . Borderline diabetes   . GERD (gastroesophageal reflux disease)   . HYPERCHOLESTEROLEMIA 04/25/2007   takes Pravastatin daily  . HYPERTENSION 01/03/2007   takes Losartan-HCTZ  and Amlodipine daily  . Hypokalemia    takes Potassium daily  . Joint pain   . Nocturia   . Pneumonia 2 yrs ago  . Primary osteoarthritis of shoulder    Right    Past Surgical History:  Procedure Laterality Date  . CESAREAN SECTION  25 years ago  . CHOLECYSTECTOMY    . COLONOSCOPY    . DILATION AND CURETTAGE OF UTERUS  35 years ago  . HERNIA REPAIR    . INCISIONAL HERNIA REPAIR  02/21/2012   Procedure: LAPAROSCOPIC INCISIONAL HERNIA;  Surgeon: Harl Bowie, MD;  Location: [place];  Service: General;  Laterality: N/A;  laparoscopic incisional hernia repair with mesh  . JOINT REPLACEMENT     bilateral knee replacements  . REPLACEMENT TOTAL KNEE BILATERAL  2010  . TOTAL HIP ARTHROPLASTY Right 03/19/2016   Procedure: RIGHT TOTAL HIP ARTHROPLASTY ANTERIOR APPROACH;  Surgeon: Frederik Pear, MD;  Location: [place];  Service: Orthopedics;   Laterality: Right;  . TOTAL HIP REVISION Right 10/02/2017   Procedure: TOTAL HIP REVISION POSTERIOR;  Surgeon: Frederik Pear, MD;  Location: [place];  Service: Orthopedics;  Laterality: Right;  . TOTAL HIP REVISION Right 05/23/2020   Procedure: RIGHT TOTAL POSTERIOR HIP REVISION;  Surgeon: Frederik Pear, MD;  Location[facility];  Service: Orthopedics;  Laterality: Right;  . TOTAL SHOULDER ARTHROPLASTY Right 12/19/2017   Procedure: RIGHT TOTAL SHOULDER ARTHROPLASTY;  Surgeon: Tania Ade, MD;  Location[facility];  Service: Orthopedics;  Laterality: Right;  . TUBAL LIGATION      There were no vitals filed for this visit.   Subjective Assessment - 07/13/20 1124    Subjective Patient says she is doing good. She was able to go up and down her basement steps using rails., Did well with this. No pain today. HEP going well.    Limitations Sitting;Lifting;Standing;Walking;House hold activities    Currently in Pain? No/denies    Pain Onset 1 to 4 weeks ago                             [facility] Adult PT Treatment/Exercise - 07/13/20 0001      Knee/Hip Exercises: Standing   Heel Raises Both;2 sets;10  reps    Heel Raises Limitations toe raises 2 x 10    Hip Abduction Both;2 sets;10 reps    Hip Extension Both;2 sets;10 reps    Extension Limitations RTB    Forward Step Up Both;1 set;15 reps;Hand Hold: 2;Step Height: 6"    Step Down Right;1 set;15 reps;Hand Hold: 2;Step Height: 6"    SLS 2 x 15 second holds HHA x 2    Other Standing Knee Exercises tandem satnce on foam 3 x 30" int HHA    Other Standing Knee Exercises forward and lateral stepping over 4 inch hurdle 2 RT each using SPC                    PT Short Term Goals - 06/20/20 1153      PT SHORT TERM GOAL #1   Title Patient will be independent with initial HEP and self-management strategies to improve functional outcomes    Time 2    Period Weeks    Status On-going    Target Date 07/01/20             PT Long  Term Goals - 06/20/20 1154      PT LONG TERM GOAL #1   Title Patient will improve FOTO score by 10% to indicate improvement in functional outcomes    Time 6    Period Weeks    Status On-going      PT LONG TERM GOAL #2   Title Patient will report at least 75% overall improvement in subjective complaint to indicate improvement in ability to perform ADLs.    Time 6    Period Weeks    Status On-going      PT LONG TERM GOAL #3   Title Patient will be able to perform stand x 5 in < 15 seconds to demonstrate improvement in functional mobility and reduced risk for falls.    Time 6    Period Weeks    Status On-going      PT LONG TERM GOAL #4   Title Patient will be able to ambulate at least 350 feet during 2MWT with LRAD to demonstrate improved ability to perform functional mobility and associated tasks.    Time 6    Period Weeks    Status On-going                 Plan - 07/13/20 1830    Clinical Impression Statement Patient progressing well to long term goals. Patient able to progress step height with step ups and added step downs. Patient also showing improvement with dynamic balance during added forward and lateral stepping over hurdles. Patient required verbal cues for ground clearance. Patient was well challenged with added foam pad to tandem stance noting increased difficulty with RLE in rear position. Patient will continue to benefit from skilled therapy services to progress hip strength and mobility for improved functional mobility and reduced risk for falls.    Personal Factors and Comorbidities Past/Current Experience    Examination-Activity Limitations Locomotion Level;Transfers;Stairs;Stand    Examination-Participation Restrictions Psychologist, forensicCommunity Activity;Cleaning;Pinc[facility] BadderYard [facility] Medical CenterWork;Laundry    Stability/Clinical Decision Making Stable/Uncomplicated    Rehab Potential Good    PT Frequency 2x / week    PT Duration 6 weeks    PT Treatment/Interventions ADLs/Self Care Home  Management;Aquatic Therapy;Biofeedback;Cryotherapy;Electrical Stimulation;Gait training;Stair training;Functional mobility training;Orthotic Fit/Training;Manual techniques;Energy conservation;Splinting;Taping;Vasopneumatic Device;Therapeutic activities;Iontophoresis 4mg /ml Dexamethasone;Therapeutic exercise;Moist Heat;Traction;Balance training;Manual lymph drainage;Dry needling;Joint Manipulations;Spinal Manipulations;Compression bandaging;Visual/perceptual remediation/compensation;Scar mobilization;Neuromuscular re-education;Ultrasound;Parrafin;Fluidtherapy;Contrast Bath;Patient/family education;DME Instruction;Passive range of motion  PT Next Visit Plan Continue to progress LE strength, hip mobility, balance and gait training. Continue with quad/hip extension coordination/strengthening    PT Home Exercise Plan Eval: added heel slides (patient reports already doing heel raises, standing hip abduction/ extension). 1/10:  sit to stand, sitting heel raise/toe raise, functional squat, semitandem stance and forward step up. Staggered stance with UE flexion overhead 1/31 supported single leg stance    Consulted and Agree with Plan of Care Patient           Patient will benefit from skilled therapeutic intervention in order to improve the following deficits and impairments:  Abnormal gait,Decreased range of motion,Decreased balance,Decreased mobility,Difficulty walking,Pain,Decreased strength  Visit Diagnosis: Pain in right hip  Other abnormalities of gait and mobility  Muscle weakness (generalized)     Problem List Patient Active Problem List   Diagnosis Date Noted  . History of revision of total replacement of right hip joint 05/23/2020  . Prediabetes 07/21/2019  . 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 Arthritis of right hip 03/19/2016  . Primary osteoarthritis of right hip 03/18/2016  . Hypokalemia 03/29/2014  .  Arthralgia 02/11/2014  . Routine general medical examination at a health care facility 03/28/2013  . Nonspecific abnormal findings on radiological and examination of lung field 03/27/2013  . Cough 03/11/2013  . Unspecified disorder of liver 03/09/2013  . Pyuria 03/09/2013  . Arthritis   . Ventral hernia 01/18/2012  . Encounter for long-term (current) use of other medications 07/05/2011  . HYPERGLYCEMIA 08/12/2008  . HYPERCHOLESTEROLEMIA 04/25/2007  . DIZZINESS AND GIDDINESS 04/25/2007  . Essential hypertension 01/03/2007  . ALLERGIC RHINITIS 01/03/2007   6:36 PM, 07/13/20 Josue Hector PT DPT  Physical Therapist with [facility]  [phone]   [facility] [address] [place], [place], [address] Phone: [phone]   Fax:  [phone]  Name: MCKENZEE BEEM MRN: [id number] Date of Birth: [birth year]

## 2020-07-18 ENCOUNTER — Ambulatory Visit (HOSPITAL_COMMUNITY): Payer: Medicare HMO | Admitting: Physical Therapy

## 2020-07-20 ENCOUNTER — Other Ambulatory Visit: Payer: Self-pay

## 2020-07-20 ENCOUNTER — Encounter (HOSPITAL_COMMUNITY): Payer: Self-pay | Admitting: Physical Therapy

## 2020-07-20 ENCOUNTER — Ambulatory Visit (HOSPITAL_COMMUNITY): Payer: Medicare HMO | Admitting: Physical Therapy

## 2020-07-20 DIAGNOSIS — M6281 Muscle weakness (generalized): Secondary | ICD-10-CM

## 2020-07-20 DIAGNOSIS — R2689 Other abnormalities of gait and mobility: Secondary | ICD-10-CM | POA: Diagnosis not present

## 2020-07-20 DIAGNOSIS — M25551 Pain in right hip: Secondary | ICD-10-CM | POA: Diagnosis not present

## 2020-07-20 NOTE — Therapy (Signed)
[place], [place], [address] Phone: [phone]   Fax:  [phone]  Physical Therapy Treatment  Patient Details  Name: Angela Estrada MRN: [id number] Date of Birth: March 22, 1951 Referring Provider (PT): Frederik Pear MD   Encounter Date: 07/20/2020   PT End of Session - 07/20/20 1118    Visit Number 8    Number of Visits 12    Date for PT Re-Evaluation 07/29/20    Authorization Type [employer]    Progress Note Due on Visit 10    PT Start Time 1116    PT Stop Time 1159    PT Time Calculation (min) 43 min    Equipment Utilized During Treatment Gait belt    Activity Tolerance Patient tolerated treatment well    Behavior During Therapy [facility] for tasks assessed/performed           Past Medical History:  Diagnosis Date  . Abdominal hernia    RLQ  . Allergy   . Arthritis   . Arthritis   . Borderline diabetes   . GERD (gastroesophageal reflux disease)   . HYPERCHOLESTEROLEMIA 04/25/2007   takes Pravastatin daily  . HYPERTENSION 01/03/2007   takes Losartan-HCTZ  and Amlodipine daily  . Hypokalemia    takes Potassium daily  . Joint pain   . Nocturia   . Pneumonia 2 yrs ago  . Primary osteoarthritis of shoulder    Right    Past Surgical History:  Procedure Laterality Date  . CESAREAN SECTION  25 years ago  . CHOLECYSTECTOMY    . COLONOSCOPY    . DILATION AND CURETTAGE OF UTERUS  35 years ago  . HERNIA REPAIR    . INCISIONAL HERNIA REPAIR  02/21/2012   Procedure: LAPAROSCOPIC INCISIONAL HERNIA;  Surgeon: Harl Bowie, MD;  Location: [place];  Service: General;  Laterality: N/A;  laparoscopic incisional hernia repair with mesh  . JOINT REPLACEMENT     bilateral knee replacements  . REPLACEMENT TOTAL KNEE BILATERAL  2010  . TOTAL HIP ARTHROPLASTY Right 03/19/2016   Procedure: RIGHT TOTAL HIP ARTHROPLASTY ANTERIOR APPROACH;  Surgeon: Frederik Pear, MD;  Location: [place];  Service: Orthopedics;   Laterality: Right;  . TOTAL HIP REVISION Right 10/02/2017   Procedure: TOTAL HIP REVISION POSTERIOR;  Surgeon: Frederik Pear, MD;  Location: [place];  Service: Orthopedics;  Laterality: Right;  . TOTAL HIP REVISION Right 05/23/2020   Procedure: RIGHT TOTAL POSTERIOR HIP REVISION;  Surgeon: Frederik Pear, MD;  [facility];  Service: Orthopedics;  Laterality: Right;  . TOTAL SHOULDER ARTHROPLASTY Right 12/19/2017   Procedure: RIGHT TOTAL SHOULDER ARTHROPLASTY;  Surgeon: Tania Ade, MD;  Location[facility];  Service: Orthopedics;  Laterality: Right;  . TUBAL LIGATION      There were no vitals filed for this visit.   Subjective Assessment - 07/20/20 1117    Subjective Patient says she is doing good. No new issues.    Limitations Sitting;Lifting;Standing;Walking;House hold activities    Currently in Pain? No/denies    Pain Onset 1 to 4 weeks ago                             [facility] Adult PT Treatment/Exercise - 07/20/20 0001      Knee/Hip Exercises: Standing   Heel Raises Both;2 sets;10 reps    Heel Raises Limitations toe raises 2 x 10    Hip Abduction Both;2 sets;10 reps  Abduction Limitations GTB    Hip Extension Both;2 sets;10 reps    Extension Limitations GTB    Forward Step Up Both;1 set;15 reps;Hand Hold: 2;Step Height: 6"    Step Down Right;1 set;15 reps;Hand Hold: 2;Step Height: 6"    SLS 3 x 15" with int HHA    Gait Training 200 feet with SPC cues for even stride length    Other Standing Knee Exercises tandem stance on foam 3 x 30" int HHA    Other Standing Knee Exercises tandem gait, sidestepping, retro walk 1 RT each      Knee/Hip Exercises: Seated   Sit to Sand 2 sets;10 reps;without UE support                    PT Short Term Goals - 06/20/20 1153      PT SHORT TERM GOAL #1   Title Patient will be independent with initial HEP and self-management strategies to improve functional outcomes    Time 2    Period Weeks    Status On-going     Target Date 07/01/20             PT Long Term Goals - 06/20/20 1154      PT LONG TERM GOAL #1   Title Patient will improve FOTO score by 10% to indicate improvement in functional outcomes    Time 6    Period Weeks    Status On-going      PT LONG TERM GOAL #2   Title Patient will report at least 75% overall improvement in subjective complaint to indicate improvement in ability to perform ADLs.    Time 6    Period Weeks    Status On-going      PT LONG TERM GOAL #3   Title Patient will be able to perform stand x 5 in < 15 seconds to demonstrate improvement in functional mobility and reduced risk for falls.    Time 6    Period Weeks    Status On-going      PT LONG TERM GOAL #4   Title Patient will be able to ambulate at least 350 feet during 2MWT with LRAD to demonstrate improved ability to perform functional mobility and associated tasks.    Time 6    Period Weeks    Status On-going                 Plan - 07/20/20 1633    Clinical Impression Statement Patient tolerated session well today. She shows progressing strength and balance. She is still limited with gait deficits and decreased stance on RLE, which makes her more reliant on cane at present. She had difficulty with dynamic gait activity today because of this and does not feel steady without her cane. She has an ankle brace for ligamentous instability but has not been wearing it. Discussed with patient to wear brace next visit to get a better idea of how well her gait has progressed, and to attempt progressing to ambulation without AD in clinic, as this is her ultimate goal.    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[facility]    Stability/Clinical Decision Making Stable/Uncomplicated    Rehab Potential Good    PT Frequency 2x / week    PT  Duration 6 weeks    PT Treatment/Interventions ADLs/Self Care Home Management;Aquatic Therapy;Biofeedback;Cryotherapy;Electrical Stimulation;Gait training;Stair training;Functional mobility training;Orthotic Fit/Training;Manual techniques;Energy conservation;Splinting;Taping;Vasopneumatic  Device;Therapeutic activities;Iontophoresis 4mg /ml Dexamethasone;Therapeutic exercise;Moist Heat;Traction;Balance training;Manual lymph drainage;Dry needling;Joint Manipulations;Spinal Manipulations;Compression bandaging;Visual/perceptual remediation/compensation;Scar mobilization;Neuromuscular re-education;Ultrasound;Parrafin;Fluidtherapy;Contrast Bath;Patient/family education;DME Instruction;Passive range of motion    PT Next Visit Plan Continue to progress gait, work toward ambualtion with no AD as able. F/u about ankle brace at next session    PT Home Exercise Plan Eval: added heel slides (patient reports already doing heel raises, standing hip abduction/ extension). 1/10: sit to stand, sitting heel raise/toe raise, functional squat, semi tandem stance and forward step up. Staggered stance with UE flexion overhead 1/31: supported single leg stance    Consulted and Agree with Plan of Care Patient           Patient will benefit from skilled therapeutic intervention in order to improve the following deficits and impairments:  Abnormal gait,Decreased range of motion,Decreased balance,Decreased mobility,Difficulty walking,Pain,Decreased strength  Visit Diagnosis: Pain in right hip  Other abnormalities of gait and mobility  Muscle weakness (generalized)     Problem List Patient Active Problem List   Diagnosis Date Noted  . History of revision of total replacement of right hip joint 05/23/2020  . Prediabetes 07/21/2019  . 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 Arthritis of right hip 03/19/2016  . Primary osteoarthritis of right hip  03/18/2016  . Hypokalemia 03/29/2014  . Arthralgia 02/11/2014  . Routine general medical examination at a health care facility 03/28/2013  . Nonspecific abnormal findings on radiological and examination of lung field 03/27/2013  . Cough 03/11/2013  . Unspecified disorder of liver 03/09/2013  . Pyuria 03/09/2013  . Arthritis   . Ventral hernia 01/18/2012  . Encounter for long-term (current) use of other medications 07/05/2011  . HYPERGLYCEMIA 08/12/2008  . HYPERCHOLESTEROLEMIA 04/25/2007  . DIZZINESS AND GIDDINESS 04/25/2007  . Essential hypertension 01/03/2007  . ALLERGIC RHINITIS 01/03/2007    4:48 PM, 07/20/20 Josue Hector PT DPT  Physical Therapist with [facility]  [phone]   [facility] [address] [place], [place], [address] Phone: [phone]   Fax:  [phone]  Name: Angela Estrada MRN: [id number] Date of Birth: 04-28-1951

## 2020-07-25 ENCOUNTER — Encounter (HOSPITAL_COMMUNITY): Payer: Medicare HMO | Admitting: Physical Therapy

## 2020-07-27 ENCOUNTER — Ambulatory Visit (INDEPENDENT_AMBULATORY_CARE_PROVIDER_SITE_OTHER): Payer: Medicare HMO | Admitting: Family Medicine

## 2020-07-27 ENCOUNTER — Other Ambulatory Visit: Payer: Self-pay

## 2020-07-27 ENCOUNTER — Emergency Department (HOSPITAL_COMMUNITY): Payer: Medicare HMO

## 2020-07-27 ENCOUNTER — Encounter (HOSPITAL_COMMUNITY): Payer: Self-pay

## 2020-07-27 ENCOUNTER — Emergency Department (HOSPITAL_COMMUNITY)
Admission: EM | Admit: 2020-07-27 | Discharge: 2020-07-28 | Disposition: A | Discharge status: Home / Self Care | Payer: Medicare HMO | Attending: Emergency Medicine | Admitting: Emergency Medicine

## 2020-07-27 ENCOUNTER — Encounter: Payer: Self-pay | Admitting: Family Medicine

## 2020-07-27 ENCOUNTER — Ambulatory Visit (HOSPITAL_COMMUNITY): Payer: Medicare HMO

## 2020-07-27 VITALS — BP 137/84 | HR 90 | Temp 97.9°F | Resp 16 | Ht 63.0 in | Wt 226.0 lb

## 2020-07-27 DIAGNOSIS — R2689 Other abnormalities of gait and mobility: Secondary | ICD-10-CM

## 2020-07-27 DIAGNOSIS — D649 Anemia, unspecified: Secondary | ICD-10-CM

## 2020-07-27 DIAGNOSIS — Z96653 Presence of artificial knee joint, bilateral: Secondary | ICD-10-CM | POA: Diagnosis not present

## 2020-07-27 DIAGNOSIS — J984 Other disorders of lung: Secondary | ICD-10-CM | POA: Diagnosis not present

## 2020-07-27 DIAGNOSIS — R0789 Other chest pain: Secondary | ICD-10-CM

## 2020-07-27 DIAGNOSIS — M6281 Muscle weakness (generalized): Secondary | ICD-10-CM | POA: Diagnosis not present

## 2020-07-27 DIAGNOSIS — Z8249 Family history of ischemic heart disease and other diseases of the circulatory system: Secondary | ICD-10-CM | POA: Diagnosis not present

## 2020-07-27 DIAGNOSIS — I10 Essential (primary) hypertension: Secondary | ICD-10-CM | POA: Diagnosis not present

## 2020-07-27 DIAGNOSIS — Z96611 Presence of right artificial shoulder joint: Secondary | ICD-10-CM | POA: Insufficient documentation

## 2020-07-27 DIAGNOSIS — Z79899 Other long term (current) drug therapy: Secondary | ICD-10-CM | POA: Insufficient documentation

## 2020-07-27 DIAGNOSIS — M25551 Pain in right hip: Secondary | ICD-10-CM

## 2020-07-27 DIAGNOSIS — E7800 Pure hypercholesterolemia, unspecified: Secondary | ICD-10-CM

## 2020-07-27 DIAGNOSIS — Z7982 Long term (current) use of aspirin: Secondary | ICD-10-CM | POA: Diagnosis not present

## 2020-07-27 DIAGNOSIS — K219 Gastro-esophageal reflux disease without esophagitis: Secondary | ICD-10-CM | POA: Diagnosis not present

## 2020-07-27 DIAGNOSIS — R079 Chest pain, unspecified: Secondary | ICD-10-CM | POA: Diagnosis not present

## 2020-07-27 DIAGNOSIS — Z96641 Presence of right artificial hip joint: Secondary | ICD-10-CM | POA: Diagnosis not present

## 2020-07-27 LAB — BASIC METABOLIC PANEL
Anion gap: 11 (ref 5–15)
BUN: 10 mg/dL (ref 8–23)
CO2: 26 mmol/L (ref 22–32)
Calcium: 9.7 mg/dL (ref 8.9–10.3)
Chloride: 101 mmol/L (ref 98–111)
Creatinine, Ser: 0.78 mg/dL (ref 0.44–1.00)
GFR, Estimated: >60 mL/min (ref >60)
Glucose, Bld: 118 mg/dL — ABNORMAL HIGH (ref 70–99)
Potassium: 3.7 mmol/L (ref 3.5–5.1)
Sodium: 138 mmol/L (ref 135–145)

## 2020-07-27 LAB — CBC
HCT: 33.4 % — ABNORMAL LOW (ref 36.0–46.0)
Hemoglobin: 9.7 g/dL — ABNORMAL LOW (ref 12.0–15.0)
MCH: 24.2 pg — ABNORMAL LOW (ref 26.0–34.0)
MCHC: 29 g/dL — ABNORMAL LOW (ref 30.0–36.0)
MCV: 83.3 fL (ref 80.0–100.0)
Platelets: 313 10*3/uL (ref 150–400)
RBC: 4.01 MIL/uL (ref 3.87–5.11)
RDW: 16 % — ABNORMAL HIGH (ref 11.5–15.5)
WBC: 5.9 10*3/uL (ref 4.0–10.5)
nRBC: 0 % (ref 0.0–0.2)

## 2020-07-27 LAB — TROPONIN I (HIGH SENSITIVITY)
Troponin I (High Sensitivity): 7 ng/L (ref <18)
Troponin I (High Sensitivity): 7 ng/L (ref <18)

## 2020-07-27 NOTE — Patient Instructions (Addendum)
  I am concerned about your chest pain and radiation to neck/jaw, especially as it is more frequent. GO to [facility] ER tonight for evaluation.    If you have lab work done today you will be contacted with your lab results within the next 2 weeks.  If you have not heard from [place] then please contact us. The fastest way to get your results is to register for My Chart.   IF you received an x-ray today, you will receive an invoice from [facility]. Please contact [facility] at [phone] with questions or concerns regarding your invoice.   IF you received labwork today, you will receive an invoice from [place]. Please contact [facility] at [phone] with questions or concerns regarding your invoice.   Our billing staff will not be able to assist you with questions regarding bills from these companies.  You will be contacted with the lab results as soon as they are available. The fastest way to get your results is to activate your My Chart account. Instructions are located on the last page of this paperwork. If you have not heard from [place] regarding the results in 2 weeks, please contact this office.

## 2020-07-27 NOTE — Progress Notes (Signed)
Subjective:  Patient ID: Angela Estrada, female    DOB: [birth year]  Age: 70 y.o. MRN: [id number]  CC:  Chief Complaint  Patient presents with  . Chest Pain    Per patient pain starts on right side of ribcage radiates up to neck and both ears with chest tightness for 4-5 months, that comes and goes. Also, per patient with chest tightness.    HPI [name] presents for    Chest pain/tightness. Pain on and off for past 6 months about once per week. Lasts 15 minutes then resolves. Occurs at rest.  Starts under R rib, moves to neck, and into jaw and ears. Some center chest pain - Tightness at times, not all the time. No pain with exertion. 3 episodes since Saturday - past week feel like happening more. Woke up with pain 2 weeks ago with nausea, chest tightness. Feeling of need to belch, but could not. R arm radiation last week.   Most recent chest pain few hours ago. Center of chest- tightness,  radiating up to neck, and ears.no arm radiation, no diaphoresis, no n/v. No dyspnea. Lasted about 46min.   No treatments.   Personal cardiac RF of HTN, HLD.Nonsmoker. prediabetes.   FH of of CAD - brother had 32 MI's earliest at age 32.  Mother had CHF, MI in 67's stroke in her sleep in her 73's.     History Patient Active Problem List   Diagnosis Date Noted  . History of revision of total replacement of right hip joint 05/23/2020  . Prediabetes 07/21/2019  . 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 Arthritis of right hip 03/19/2016  . Primary osteoarthritis of right hip 03/18/2016  . Hypokalemia 03/29/2014  . Arthralgia 02/11/2014  . Routine general medical examination at a health care facility 03/28/2013  . Nonspecific abnormal findings on radiological and examination of lung field 03/27/2013  . Cough 03/11/2013  . Unspecified disorder of liver 03/09/2013  . Pyuria 03/09/2013  . Arthritis   . Ventral  hernia 01/18/2012  . Encounter for long-term (current) use of other medications 07/05/2011  . HYPERGLYCEMIA 08/12/2008  . HYPERCHOLESTEROLEMIA 04/25/2007  . DIZZINESS AND GIDDINESS 04/25/2007  . Essential hypertension 01/03/2007  . ALLERGIC RHINITIS 01/03/2007   Past Medical History:  Diagnosis Date  . Abdominal hernia    RLQ  . Allergy   . Arthritis   . Arthritis   . Borderline diabetes   . GERD (gastroesophageal reflux disease)   . HYPERCHOLESTEROLEMIA 04/25/2007   takes Pravastatin daily  . HYPERTENSION 01/03/2007   takes Losartan-HCTZ  and Amlodipine daily  . Hypokalemia    takes Potassium daily  . Joint pain   . Nocturia   . Pneumonia 2 yrs ago  . Primary osteoarthritis of shoulder    Right   Past Surgical History:  Procedure Laterality Date  . CESAREAN SECTION  25 years ago  . CHOLECYSTECTOMY    . COLONOSCOPY    . DILATION AND CURETTAGE OF UTERUS  35 years ago  . HERNIA REPAIR    . INCISIONAL HERNIA REPAIR  02/21/2012   Procedure: LAPAROSCOPIC INCISIONAL HERNIA;  Surgeon: Harl Bowie, MD;  Location: [place];  Service: General;  Laterality: N/A;  laparoscopic incisional hernia repair with mesh  . JOINT REPLACEMENT     bilateral knee replacements  . REPLACEMENT TOTAL KNEE BILATERAL  2010  . TOTAL HIP ARTHROPLASTY Right 03/19/2016   Procedure: RIGHT  TOTAL HIP ARTHROPLASTY ANTERIOR APPROACH;  Surgeon: Frederik Pear, MD;  Location: [place];  Service: Orthopedics;  Laterality: Right;  . TOTAL HIP REVISION Right 10/02/2017   Procedure: TOTAL HIP REVISION POSTERIOR;  Surgeon: Frederik Pear, MD;  Location: [place];  Service: Orthopedics;  Laterality: Right;  . TOTAL HIP REVISION Right 05/23/2020   Procedure: RIGHT TOTAL POSTERIOR HIP REVISION;  Surgeon: Frederik Pear, MD;  [facility];  Service: Orthopedics;  Laterality: Right;  . TOTAL SHOULDER ARTHROPLASTY Right 12/19/2017   Procedure: RIGHT TOTAL SHOULDER ARTHROPLASTY;  Surgeon: Tania Ade, MD;  [facility];   Service: Orthopedics;  Laterality: Right;  . TUBAL LIGATION     Allergies  Allergen Reactions  . Lisinopril Cough  . Oxycodone Other (See Comments)    Hallucinations   Prior to Admission medications   Medication Sig Start Date End Date Taking? Authorizing Provider  amLODipine (NORVASC) 2.5 MG tablet Take 1 tablet (2.5 mg total) by mouth daily. 03/14/20  Yes Jacelyn Pi, Irma M, MD  Apoaequorin (PREVAGEN) 10 MG CAPS Take 10 mg by mouth daily.   Yes [provider]  aspirin EC 81 MG tablet Take 1 tablet (81 mg total) by mouth 2 (two) times daily. 05/23/20  Yes Leighton Parody, PA-C  Carboxymethylcellul-Glycerin (LUBRICATING EYE DROPS OP) Place 1 drop into both eyes daily as needed (dry eyes).   Yes [provider]  cetirizine-pseudoephedrine (ZYRTEC-D) 5-120 MG tablet Take 1 tablet by mouth daily as needed for allergies.   Yes [provider]  famotidine (PEPCID) 20 MG tablet TAKE 1 TABLET BY MOUTH TWICE A DAY 06/28/20  Yes Maximiano Coss, NP  Multiple Vitamin (MULTIVITAMIN WITH MINERALS) TABS tablet Take 1 tablet by mouth daily.   Yes [provider]  oxybutynin (DITROPAN) 5 MG tablet Take 1 tablet (5 mg total) by mouth 2 (two) times daily. 03/14/20  Yes Jacelyn Pi, Lilia Argue, MD  potassium chloride SA (KLOR-CON M20) 20 MEQ tablet Take 1 tablet (20 mEq total) by mouth 2 (two) times daily. 03/14/20  Yes Jacelyn Pi, Lilia Argue, MD  pravastatin (PRAVACHOL) 20 MG tablet Take 1 tablet (20 mg total) by mouth every evening. 03/14/20  Yes Jacelyn Pi, Lilia Argue, MD  telmisartan-hydrochlorothiazide (MICARDIS HCT) 80-25 MG tablet Take 1 tablet by mouth daily. 03/14/20  Yes Jacelyn Pi, Lilia Argue, MD  triamcinolone cream (KENALOG) 0.1 % Apply 1 application topically 2 (two) times daily. 03/13/18  Yes Jacelyn Pi, Lilia Argue, MD  HYDROmorphone (DILAUDID) 2 MG tablet Take 1 tablet (2 mg total) by mouth every 4 (four) hours as needed for severe pain. Patient not taking: Reported  on 07/27/2020 05/23/20   Leighton Parody, PA-C  Olopatadine HCl 0.2 % SOLN Apply 1 drop to eye daily. Patient not taking: Reported on 07/27/2020 07/21/19   Jacelyn Pi, Lilia Argue, MD  omeprazole (PRILOSEC) 20 MG capsule Take 1 capsule (20 mg total) by mouth daily as needed (reflux/heartburn). Patient not taking: Reported on 07/27/2020 03/14/20   Jacelyn Pi, Lilia Argue, MD  tiZANidine (ZANAFLEX) 2 MG tablet Take 1 tablet (2 mg total) by mouth every 6 (six) hours as needed for muscle spasms. Patient not taking: Reported on 07/27/2020 05/23/20   Leighton Parody, PA-C   Social History   Socioeconomic History  . Marital status: Married    Spouse name: Not on file  . Number of children: 1  . Years of education: 12th grade +  . Highest education level: Not on file  Occupational History  . Occupation: Beautician  Tobacco Use  . Smoking status: Never Smoker  . Smokeless tobacco: Never Used  Vaping Use  . Vaping Use: Never used  Substance and Sexual Activity  . Alcohol use: No    Alcohol/week: 0.0 standard drinks  . Drug use: No  . Sexual activity: Not Currently    Partners: Male    Birth control/protection: Post-menopausal  Other Topics Concern  . Not on file  Social History Narrative   Lives with her husband.   Adult daughter lives nearby.   Social Determinants of Health   Financial Resource Strain: Not on file  Food Insecurity: Not on file  Transportation Needs: Not on file  Physical Activity: Not on file  Stress: Not on file  Social Connections: Not on file  Intimate Partner Violence: Not on file    Review of Systems  Per HPI.  Objective:   Vitals:   07/27/20 1655  BP: 137/84  Pulse: 90  Resp: 16  Temp: 97.9 F (36.6 C)  TempSrc: Temporal  SpO2: 97%  Weight: 226 lb (102.5 kg)  Height: 5\' 3"  (1.6 m)     Physical Exam Vitals reviewed.  Constitutional:      Appearance: She is well-developed and well-nourished.  HENT:     Head: Normocephalic and atraumatic.   Eyes:     Extraocular Movements: EOM normal.     Conjunctiva/sclera: Conjunctivae normal.     Pupils: Pupils are equal, round, and reactive to light.  Neck:     Vascular: No carotid bruit.  Cardiovascular:     Rate and Rhythm: Normal rate and regular rhythm.     Pulses: Intact distal pulses.     Heart sounds: Normal heart sounds.     Comments: Minimal discomfort with palpation over anterior chest. Pulmonary:     Effort: Pulmonary effort is normal.     Breath sounds: Normal breath sounds.  Abdominal:     Palpations: Abdomen is soft. There is no pulsatile mass.     Tenderness: There is no abdominal tenderness.  Skin:    General: Skin is warm and dry.  Neurological:     Mental Status: She is alert and oriented to person, place, and time.  Psychiatric:        Mood and Affect: Mood and affect normal.        Behavior: Behavior normal.     EKG: Sinus rhythm, rate 67.  Compared to 05/02/2020.  No apparent ST or T wave changes.  31 minutes spent during visit, greater than 50% counseling and assimilation of information, chart review, and discussion of plan.   Assessment & Plan:  Angela Estrada is a 70 y.o. female . Chest tightness - Plan: EKG 12-Lead  Essential hypertension  HYPERCHOLESTEROLEMIA  Family history of heart disease  Recurrent episodes of right lower chest, infra costal pain radiating to center chest, tightness, radiation to the jaw, ears.  Symptoms have been present about once per week reportedly over the past 4 to 6 months but have increased in frequency in the past week.  Most recent episode today prior to evaluation in the office.  Asymptomatic at present.  No acute findings on EKG but cardiac risk factors of hypertension, hyperlipidemia, prediabetes.  Family history of early cardiac disease in brother.  -Differential includes unstable angina/anginal equivalents.  Minimal reproduction of discomfort on exam but still concerned history as above.  With episode on  the way here today we will have her be evaluated  through ER tonight, then if thought to be stable for outpatient evaluation can refer to cardiology.  Nurse advised at [facility], ER.   No orders of the defined types were placed in this encounter.  Patient Instructions    I am concerned about your chest pain and radiation to neck/jaw, especially as it is more frequent. GO to [facility] ER tonight for evaluation.    If you have lab work done today you will be contacted with your lab results within the next 2 weeks.  If you have not heard from [place] then please contact us. The fastest way to get your results is to register for My Chart.   IF you received an x-ray today, you will receive an invoice from [facility]. Please contact [facility] at [phone] with questions or concerns regarding your invoice.   IF you received labwork today, you will receive an invoice from [place]. Please contact [facility] at [phone] with questions or concerns regarding your invoice.   Our billing staff will not be able to assist you with questions regarding bills from these companies.  You will be contacted with the lab results as soon as they are available. The fastest way to get your results is to activate your My Chart account. Instructions are located on the last page of this paperwork. If you have not heard from [place] regarding the results in 2 weeks, please contact this office.         Signed, Merri Ray, MD [facility]

## 2020-07-27 NOTE — Therapy (Signed)
[facility] [address] [place], [place], [address] Phone: [phone]   Fax:  [phone]  Physical Therapy Treatment  Patient Details  Name: Angela Estrada MRN: [id number] Date of Birth: [birth year] Referring Provider (PT): Frederik Pear MD   Encounter Date: 07/27/2020   PT End of Session - 07/27/20 1120    Visit Number 9    Number of Visits 12    Date for PT Re-Evaluation 07/29/20    Authorization Type [employer]    Progress Note Due on Visit 10    PT Start Time 1116    PT Stop Time 1200    PT Time Calculation (min) 44 min    Equipment Utilized During Treatment Gait belt    Activity Tolerance Patient tolerated treatment well    Behavior During Therapy [facility] for tasks assessed/performed           Past Medical History:  Diagnosis Date  . Abdominal hernia    RLQ  . Allergy   . Arthritis   . Arthritis   . Borderline diabetes   . GERD (gastroesophageal reflux disease)   . HYPERCHOLESTEROLEMIA 04/25/2007   takes Pravastatin daily  . HYPERTENSION 01/03/2007   takes Losartan-HCTZ  and Amlodipine daily  . Hypokalemia    takes Potassium daily  . Joint pain   . Nocturia   . Pneumonia 2 yrs ago  . Primary osteoarthritis of shoulder    Right    Past Surgical History:  Procedure Laterality Date  . CESAREAN SECTION  25 years ago  . CHOLECYSTECTOMY    . COLONOSCOPY    . DILATION AND CURETTAGE OF UTERUS  35 years ago  . HERNIA REPAIR    . INCISIONAL HERNIA REPAIR  02/21/2012   Procedure: LAPAROSCOPIC INCISIONAL HERNIA;  Surgeon: Harl Bowie, MD;  Location: [facility];  Service: General;  Laterality: N/A;  laparoscopic incisional hernia repair with mesh  . JOINT REPLACEMENT     bilateral knee replacements  . REPLACEMENT TOTAL KNEE BILATERAL  2010  . TOTAL HIP ARTHROPLASTY Right 03/19/2016   Procedure: RIGHT TOTAL HIP ARTHROPLASTY ANTERIOR APPROACH;  Surgeon: Frederik Pear, MD;  Location: [facility];  Service: Orthopedics;   Laterality: Right;  . TOTAL HIP REVISION Right 10/02/2017   Procedure: TOTAL HIP REVISION POSTERIOR;  Surgeon: Frederik Pear, MD;  Location: [facility];  Service: Orthopedics;  Laterality: Right;  . TOTAL HIP REVISION Right 05/23/2020   Procedure: RIGHT TOTAL POSTERIOR HIP REVISION;  Surgeon: Frederik Pear, MD;  Location: [facility];  Service: Orthopedics;  Laterality: Right;  . TOTAL SHOULDER ARTHROPLASTY Right 12/19/2017   Procedure: RIGHT TOTAL SHOULDER ARTHROPLASTY;  Surgeon: Tania Ade, MD;  Location: [facility];  Service: Orthopedics;  Laterality: Right;  . TUBAL LIGATION      There were no vitals filed for this visit.   Subjective Assessment - 07/27/20 1120    Subjective Pt reports feeling better every day and improved walking with cane    Limitations Sitting;Lifting;Standing;Walking;House hold activities    Pain Onset 1 to 4 weeks ago              [facility] PT Assessment - 07/27/20 0001      Assessment   Medical Diagnosis RT THA    Referring Provider (PT) Frederik Pear MD    Onset Date/Surgical Date 05/23/20    Prior Therapy yes [place]  [place] Adult PT Treatment/Exercise - 07/27/20 0001      Knee/Hip Exercises: Standing   Knee Flexion Strengthening;Right;2 sets;10 reps    Knee Flexion Limitations 5 lbs    SLS left foot elevated on 6" step performing lift-offs for RLE stance stability 3x10    Other Standing Knee Exercises static standing airex pad 2x60 sec with red band paloff press. Offset stance to improve RLE loading response requiring tactile cues/support to right hip abductors to facilitate stability and enable unsupported position.  Techniques to facilitate right hip external rotation with planted RLE to improve rotational stability/support    Other Standing Knee Exercises sidestepping 2x2 min with 5 lbs ankle weights      Knee/Hip Exercises: Seated   Long Arc Quad Strengthening;Both;3 sets;10 reps    Long Arc Quad Weight 5 lbs.    Ball Squeeze  3x10, 2 sec hold                  PT Education - 07/27/20 1127    Education Details education on progress with STG/LTG    Person(s) Educated Patient    Methods Explanation    Comprehension Verbalized understanding            PT Short Term Goals - 06/20/20 1153      PT SHORT TERM GOAL #1   Title Patient will be independent with initial HEP and self-management strategies to improve functional outcomes    Time 2    Period Weeks    Status On-going    Target Date 07/01/20             PT Long Term Goals - 06/20/20 1154      PT LONG TERM GOAL #1   Title Patient will improve FOTO score by 10% to indicate improvement in functional outcomes    Time 6    Period Weeks    Status On-going      PT LONG TERM GOAL #2   Title Patient will report at least 75% overall improvement in subjective complaint to indicate improvement in ability to perform ADLs.    Time 6    Period Weeks    Status On-going      PT LONG TERM GOAL #3   Title Patient will be able to perform stand x 5 in < 15 seconds to demonstrate improvement in functional mobility and reduced risk for falls.    Time 6    Period Weeks    Status On-going      PT LONG TERM GOAL #4   Title Patient will be able to ambulate at least 350 feet during 2MWT with LRAD to demonstrate improved ability to perform functional mobility and associated tasks.    Time 6    Period Weeks    Status On-going                 Plan - 07/27/20 1128    Clinical Impression Statement Exhibits right hip drop in stance phase and difficulty with sustaining unsupported standing, especially with RLE bias due to glute med/max weakness which limits single leg balance and stance phase stability.  Improved performance with tactile cues/lateral hip support to right LE allowing single leg balance. Patient would benefit from continued PT intervention to improve right hip abduction, extension, external rotation strength to improve stance phase control     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[facility]    Stability/Clinical  Decision Making Stable/Uncomplicated    Rehab Potential Good    PT Frequency 2x / week    PT Duration 6 weeks    PT Treatment/Interventions ADLs/Self Care Home Management;Aquatic Therapy;Biofeedback;Cryotherapy;Electrical Stimulation;Gait training;Stair training;Functional mobility training;Orthotic Fit/Training;Manual techniques;Energy conservation;Splinting;Taping;Vasopneumatic Device;Therapeutic activities;Iontophoresis 4mg /ml Dexamethasone;Therapeutic exercise;Moist Heat;Traction;Balance training;Manual lymph drainage;Dry needling;Joint Manipulations;Spinal Manipulations;Compression bandaging;Visual/perceptual remediation/compensation;Scar mobilization;Neuromuscular re-education;Ultrasound;Parrafin;Fluidtherapy;Contrast Bath;Patient/family education;DME Instruction;Passive range of motion    PT Next Visit Plan Continue to progress gait, work toward ambualtion with no AD as able. F/u about ankle brace at next session. PROGRESS NOTE, increase hip abduction, extension, external rotation strength    PT Home Exercise Plan Eval: added heel slides (patient reports already doing heel raises, standing hip abduction/ extension). 1/10: sit to stand, sitting heel raise/toe raise, functional squat, semi tandem stance and forward step up. Staggered stance with UE flexion overhead 1/31: supported single leg stance    Consulted and Agree with Plan of Care Patient           Patient will benefit from skilled therapeutic intervention in order to improve the following deficits and impairments:  Abnormal gait,Decreased range of motion,Decreased balance,Decreased mobility,Difficulty walking,Pain,Decreased strength  Visit Diagnosis: Pain in right hip  Other  abnormalities of gait and mobility  Muscle weakness (generalized)     Problem List Patient Active Problem List   Diagnosis Date Noted  . History of revision of total replacement of right hip joint 05/23/2020  . Prediabetes 07/21/2019  . 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 Arthritis of right hip 03/19/2016  . Primary osteoarthritis of right hip 03/18/2016  . Hypokalemia 03/29/2014  . Arthralgia 02/11/2014  . Routine general medical examination at a health care facility 03/28/2013  . Nonspecific abnormal findings on radiological and examination of lung field 03/27/2013  . Cough 03/11/2013  . Unspecified disorder of liver 03/09/2013  . Pyuria 03/09/2013  . Arthritis   . Ventral hernia 01/18/2012  . Encounter for long-term (current) use of other medications 07/05/2011  . HYPERGLYCEMIA 08/12/2008  . HYPERCHOLESTEROLEMIA 04/25/2007  . DIZZINESS AND GIDDINESS 04/25/2007  . Essential hypertension 01/03/2007  . ALLERGIC RHINITIS 01/03/2007    12:13 PM, 07/27/20 M. Sherlyn Lees, PT, DPT Physical Therapist- [place] Office Number: [phone]  [facility] [address] [place], [place], [address] Phone: [phone]   Fax:  [phone]  Name: Angela Estrada MRN: [id number] Date of Birth: 19-Feb-1951

## 2020-07-27 NOTE — ED Triage Notes (Signed)
Patient reports she went to her PCP today and was referred here for chest pain, states it has been going on for a little while, midsternal in nature that radiates into her neck and jaw.

## 2020-07-28 NOTE — ED Notes (Signed)
Pt verbalized understanding of d/c paperwork and follow up care. IV removed and bleeding controlled. Pt taken to lobby in wheelchair for safety.

## 2020-07-28 NOTE — ED Provider Notes (Signed)
[facility] EMERGENCY DEPARTMENT Provider Note   CSN: [id number] Arrival date & time: 07/27/20  1846     History Chief Complaint  Patient presents with  . Chest Pain    Angela Estrada is a 70 y.o. female presenting for evaluation of chest pain.  Patient states that the past 6 months, she has had intermittent episodes of chest pain.  They always feel the same, starting in her chest and radiating to her jaw.  Last for 10 to 15 minutes before resolving without intervention.  No associated shortness of breath, nausea, vomiting, clamminess or sweatiness.  No associated dizziness or lightheadedness.  The episodes have been slightly more frequent.  She saw her PCP yesterday, and due to the increasing frequency of these episodes, sent to the ER for further evaluation.  Patient has been chest pain-free for the entire time in the ER.  She has never had a cardiac evaluation, does not have a cardiologist.  No recent change in medications.  She has a history of hypertension, hyperlipidemia, high cholesterol, GERD.  Episodes occur both at rest and with exertion.  She denies tobacco use.  She does have a family history of heart problems.  No recent illnesses including fever, cough, urinary symptoms, abnormal bowel movements.  Additional history taken chart reviewed.  Patient with a history of arthritis, borderline diabetes, GERD, hypertension, hypercholesterolemia,  HPI     Past Medical History:  Diagnosis Date  . Abdominal hernia    RLQ  . Allergy   . Arthritis   . Arthritis   . Borderline diabetes   . GERD (gastroesophageal reflux disease)   . HYPERCHOLESTEROLEMIA 04/25/2007   takes Pravastatin daily  . HYPERTENSION 01/03/2007   takes Losartan-HCTZ  and Amlodipine daily  . Hypokalemia    takes Potassium daily  . Joint pain   . Nocturia   . Pneumonia 2 yrs ago  . Primary osteoarthritis of shoulder    Right    Patient Active Problem List   Diagnosis Date Noted   . History of revision of total replacement of right hip joint 05/23/2020  . Prediabetes 07/21/2019  . 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 Arthritis of right hip 03/19/2016  . Primary osteoarthritis of right hip 03/18/2016  . Hypokalemia 03/29/2014  . Arthralgia 02/11/2014  . Routine general medical examination at a health care facility 03/28/2013  . Nonspecific abnormal findings on radiological and examination of lung field 03/27/2013  . Cough 03/11/2013  . Unspecified disorder of liver 03/09/2013  . Pyuria 03/09/2013  . Arthritis   . Ventral hernia 01/18/2012  . Encounter for long-term (current) use of other medications 07/05/2011  . HYPERGLYCEMIA 08/12/2008  . HYPERCHOLESTEROLEMIA 04/25/2007  . DIZZINESS AND GIDDINESS 04/25/2007  . Essential hypertension 01/03/2007  . ALLERGIC RHINITIS 01/03/2007    Past Surgical History:  Procedure Laterality Date  . CESAREAN SECTION  25 years ago  . CHOLECYSTECTOMY    . COLONOSCOPY    . DILATION AND CURETTAGE OF UTERUS  35 years ago  . HERNIA REPAIR    . INCISIONAL HERNIA REPAIR  02/21/2012   Procedure: LAPAROSCOPIC INCISIONAL HERNIA;  Surgeon: Harl Bowie, MD;  Location: [facility];  Service: General;  Laterality: N/A;  laparoscopic incisional hernia repair with mesh  . JOINT REPLACEMENT     bilateral knee replacements  . REPLACEMENT TOTAL KNEE BILATERAL  2010  . TOTAL HIP ARTHROPLASTY Right 03/19/2016   Procedure:  RIGHT TOTAL HIP ARTHROPLASTY ANTERIOR APPROACH;  Surgeon: Frederik Pear, MD;  Location: [facility];  Service: Orthopedics;  Laterality: Right;  . TOTAL HIP REVISION Right 10/02/2017   Procedure: TOTAL HIP REVISION POSTERIOR;  Surgeon: Frederik Pear, MD;  Location: [facility];  Service: Orthopedics;  Laterality: Right;  . TOTAL HIP REVISION Right 05/23/2020   Procedure: RIGHT TOTAL POSTERIOR HIP REVISION;  Surgeon: Frederik Pear, MD;  Location[facility];  Service:  Orthopedics;  Laterality: Right;  . TOTAL SHOULDER ARTHROPLASTY Right 12/19/2017   Procedure: RIGHT TOTAL SHOULDER ARTHROPLASTY;  Surgeon: Tania Ade, MD;  Location[facility];  Service: Orthopedics;  Laterality: Right;  . TUBAL LIGATION       OB History    Gravida  1   Para  1   Term  1   Preterm      AB      Living  1     SAB      IAB      Ectopic      Multiple      Live Births  1           Family History  Problem Relation Age of Onset  . Diabetes Mother   . Heart disease Mother   . Hypertension Mother   . Stroke Mother   . Diabetes Father   . Heart disease Father        Triple Bypass  . Hypertension Father   . Heart attack Brother   . Congestive Heart Failure Brother   . Hypertension Brother   . Heart disease Brother   . Hypertension Sister   . Hypertension Sister   . Cancer Sister        uterine  . Hypertension Sister   . Hypertension Sister   . Hypertension Sister   . Hypertension Sister   . Cancer Sister        uterine  . Colon cancer Neg Hx     Social History   Tobacco Use  . Smoking status: Never Smoker  . Smokeless tobacco: Never Used  Vaping Use  . Vaping Use: Never used  Substance Use Topics  . Alcohol use: No    Alcohol/week: 0.0 standard drinks  . Drug use: No    Home Medications Prior to Admission medications   Medication Sig Start Date End Date Taking? Authorizing Provider  amLODipine (NORVASC) 2.5 MG tablet Take 1 tablet (2.5 mg total) by mouth daily. 03/14/20   Jacelyn Pi, Lilia Argue, MD  Apoaequorin (PREVAGEN) 10 MG CAPS Take 10 mg by mouth daily.    [provider]  aspirin EC 81 MG tablet Take 1 tablet (81 mg total) by mouth 2 (two) times daily. 05/23/20   Leighton Parody, PA-C  Carboxymethylcellul-Glycerin (LUBRICATING EYE DROPS OP) Place 1 drop into both eyes daily as needed (dry eyes).    [provider]  cetirizine-pseudoephedrine (ZYRTEC-D) 5-120 MG tablet Take 1 tablet by mouth daily as  needed for allergies.    [provider]  famotidine (PEPCID) 20 MG tablet TAKE 1 TABLET BY MOUTH TWICE A DAY 06/28/20   Maximiano Coss, NP  HYDROmorphone (DILAUDID) 2 MG tablet Take 1 tablet (2 mg total) by mouth every 4 (four) hours as needed for severe pain. Patient not taking: Reported on 07/27/2020 05/23/20   Leighton Parody, PA-C  Multiple Vitamin (MULTIVITAMIN WITH MINERALS) TABS tablet Take 1 tablet by mouth daily.    [provider]  Olopatadine HCl 0.2 % SOLN  Apply 1 drop to eye daily. Patient not taking: Reported on 07/27/2020 07/21/19   Jacelyn Pi, Lilia Argue, MD  omeprazole (PRILOSEC) 20 MG capsule Take 1 capsule (20 mg total) by mouth daily as needed (reflux/heartburn). Patient not taking: Reported on 07/27/2020 03/14/20   Jacelyn Pi, Lilia Argue, MD  oxybutynin (DITROPAN) 5 MG tablet Take 1 tablet (5 mg total) by mouth 2 (two) times daily. 03/14/20   Jacelyn Pi, Lilia Argue, MD  potassium chloride SA (KLOR-CON M20) 20 MEQ tablet Take 1 tablet (20 mEq total) by mouth 2 (two) times daily. 03/14/20   Daleen Squibb, MD  pravastatin (PRAVACHOL) 20 MG tablet Take 1 tablet (20 mg total) by mouth every evening. 03/14/20   Daleen Squibb, MD  telmisartan-hydrochlorothiazide (MICARDIS HCT) 80-25 MG tablet Take 1 tablet by mouth daily. 03/14/20   Jacelyn Pi, Lilia Argue, MD  tiZANidine (ZANAFLEX) 2 MG tablet Take 1 tablet (2 mg total) by mouth every 6 (six) hours as needed for muscle spasms. Patient not taking: Reported on 07/27/2020 05/23/20   Leighton Parody, PA-C  triamcinolone cream (KENALOG) 0.1 % Apply 1 application topically 2 (two) times daily. 03/13/18   Daleen Squibb, MD    Allergies    Lisinopril and Oxycodone  Review of Systems   Review of Systems  Cardiovascular: Positive for chest pain (Intermittent, none currently).  All other systems reviewed and are negative.   Physical Exam Updated Vital Signs BP 132/75   Pulse 64   Temp (!) 97.4 F (36.3  C) (Oral)   Resp 17   LMP 08/30/1998 (Approximate)   SpO2 100%   Physical Exam Vitals and nursing note reviewed.  Constitutional:      General: She is not in acute distress.    Appearance: She is well-developed and well-nourished.     Comments: Resting in the bed in NAD  HENT:     Head: Normocephalic and atraumatic.  Eyes:     Extraocular Movements: Extraocular movements intact and EOM normal.     Conjunctiva/sclera: Conjunctivae normal.     Pupils: Pupils are equal, round, and reactive to light.  Cardiovascular:     Rate and Rhythm: Normal rate and regular rhythm.     Pulses: Normal pulses and intact distal pulses.  Pulmonary:     Effort: Pulmonary effort is normal. No respiratory distress.     Breath sounds: Normal breath sounds. No wheezing.     Comments: Mild ttp of the central chest wall Chest:     Chest wall: Tenderness present.  Abdominal:     General: There is no distension.     Palpations: Abdomen is soft. There is no mass.     Tenderness: There is no abdominal tenderness. There is no guarding or rebound.     Comments: No ttp of the abd. No rigidity or distention  Musculoskeletal:        General: Normal range of motion.     Cervical back: Normal range of motion and neck supple.     Comments: L leg swollen when compared to R, baseline per pt. No ttp of the calf  Skin:    General: Skin is warm and dry.     Capillary Refill: Capillary refill takes less than 2 seconds.  Neurological:     Mental Status: She is alert and oriented to person, place, and time.  Psychiatric:        Mood and Affect: Mood and affect normal.  ED Results / Procedures / Treatments   Labs (all labs ordered are listed, but only abnormal results are displayed) Labs Reviewed  BASIC METABOLIC PANEL - Abnormal; Notable for the following components:      Result Value   Glucose, Bld 118 (*)    All other components within normal limits  CBC - Abnormal; Notable for the following components:    Hemoglobin 9.7 (*)    HCT 33.4 (*)    MCH 24.2 (*)    MCHC 29.0 (*)    RDW 16.0 (*)    All other components within normal limits  TROPONIN I (HIGH SENSITIVITY)  TROPONIN I (HIGH SENSITIVITY)    EKG EKG Interpretation  Date/Time:  Wednesday July 27 2020 19:18:38 EST Ventricular Rate:  78 PR Interval:  176 QRS Duration: 82 QT Interval:  384 QTC Calculation: 437 R Axis:   22 Text Interpretation: Normal sinus rhythm Minimal voltage criteria for LVH, may be normal variant ( R in aVL ) Septal infarct , age undetermined Abnormal ECG No significant change from May 2007 ecg Confirmed by Octaviano Glow [phone]) on 07/27/2020 10:12:16 PM   Radiology DG Chest 2 View  Result Date: 07/27/2020 CLINICAL DATA:  Chest, neck and jaw pain. EXAM: CHEST - 2 VIEW COMPARISON:  05/13/2020 FINDINGS: Heart size is normal. There is tortuosity of the aorta. There is mild linear scarring at the lung bases as seen previously. No sign of active infiltrate, mass, effusion or collapse. No pulmonary edema. IMPRESSION: No active disease. Mild linear scarring at the lung bases. Electronically Signed   By: Nelson Chimes M.D.   On: 07/27/2020 19:48    Procedures Procedures   Medications Ordered in ED Medications - No data to display  ED Course  I have reviewed the triage vital signs and the nursing notes.  Pertinent labs & imaging results that were available during my care of the patient were reviewed by me and considered in my medical decision making (see chart for details).    MDM Rules/Calculators/A&P                          Patient presented for evaluation of chest pain.  She is not currently having chest pain.  This has been intermittent, going on for many months.  I saw patient approximately 11.5 hours after arrival to the ER.  On my exam, patient appears nontoxic.  No infectious symptoms, doubt pneumonia.  Considering intermittent nature, unstable vital signs, doubt PE.  Labs obtained from triage  interpreted by me.  Does show mild anemia when compared to previous, however not at a level needing a transfusion.  Due to patient's intermittent symptoms, doubt that this is related to her chest pain.  She is not on a blood thinner, denies hematemesis, hematochezia, melena, hematochezia. Troponins negative x2.  EKG unchanged from previous.  Chest x-ray viewed interpreted by me, no pneumonia, pneumothorax effusion. Per heart pathway, pt with score of 5 due to her risk factors.  Will consult with cardiology to help facilitate close outpatient follow-up.  Discussed with Dr. Terrence Dupont from cardiology, who can see patient in the office tomorrow.  Discussed findings and plan with patient and husband.  Discussed importance of close outpatient follow-up due to her risk factors, however at this time, there does not appear to be an acute or life-threatening event requiring hospitalization.  Discussed low blood counts, follow-up with PCP.  At this time, patient appears safe for discharge.  Return  precautions given.  Patient and husband state they understand and agree to plan.  Final Clinical Impression(s) / ED Diagnoses Final diagnoses:  Atypical chest pain  Anemia, unspecified type    Rx / DC Orders ED Discharge Orders    None       Franchot Heidelberg, PA-C 07/28/20 0737    Malvin Johns, MD 07/28/20 [phone]

## 2020-07-28 NOTE — Discharge Instructions (Signed)
Call Dr. Zenia Resides office today to set up an appointment for tomorrow.  Your blood counts were slightly low today.  This should be rechecked by your primary care doctor. Continue taking Coumadin patient as prescribed. Return to the emergency room if you develop severe/worsening chest pain, especially if associated with shortness of breath, sweatiness, nausea/vomiting.  Return with any new, worsening, concerning symptoms.

## 2020-08-01 ENCOUNTER — Other Ambulatory Visit (HOSPITAL_COMMUNITY): Payer: Self-pay | Admitting: Cardiology

## 2020-08-01 DIAGNOSIS — E785 Hyperlipidemia, unspecified: Secondary | ICD-10-CM | POA: Diagnosis not present

## 2020-08-01 DIAGNOSIS — R7303 Prediabetes: Secondary | ICD-10-CM | POA: Diagnosis not present

## 2020-08-01 DIAGNOSIS — R079 Chest pain, unspecified: Secondary | ICD-10-CM

## 2020-08-01 DIAGNOSIS — I10 Essential (primary) hypertension: Secondary | ICD-10-CM | POA: Diagnosis not present

## 2020-08-01 DIAGNOSIS — R0789 Other chest pain: Secondary | ICD-10-CM | POA: Diagnosis not present

## 2020-08-02 DIAGNOSIS — Z96651 Presence of right artificial knee joint: Secondary | ICD-10-CM | POA: Diagnosis not present

## 2020-08-02 DIAGNOSIS — Z471 Aftercare following joint replacement surgery: Secondary | ICD-10-CM | POA: Diagnosis not present

## 2020-08-02 DIAGNOSIS — Z96641 Presence of right artificial hip joint: Secondary | ICD-10-CM | POA: Diagnosis not present

## 2020-08-03 ENCOUNTER — Encounter: Payer: Self-pay | Admitting: Family Medicine

## 2020-08-03 ENCOUNTER — Ambulatory Visit (INDEPENDENT_AMBULATORY_CARE_PROVIDER_SITE_OTHER): Payer: Medicare HMO | Admitting: Family Medicine

## 2020-08-03 ENCOUNTER — Encounter (HOSPITAL_COMMUNITY): Payer: Self-pay | Admitting: Physical Therapy

## 2020-08-03 ENCOUNTER — Other Ambulatory Visit: Payer: Self-pay

## 2020-08-03 ENCOUNTER — Ambulatory Visit (HOSPITAL_COMMUNITY): Payer: Medicare HMO | Admitting: Physical Therapy

## 2020-08-03 VITALS — BP 118/79 | HR 94 | Temp 98.3°F | Ht 63.0 in | Wt 228.0 lb

## 2020-08-03 DIAGNOSIS — M6281 Muscle weakness (generalized): Secondary | ICD-10-CM | POA: Diagnosis not present

## 2020-08-03 DIAGNOSIS — R0789 Other chest pain: Secondary | ICD-10-CM | POA: Diagnosis not present

## 2020-08-03 DIAGNOSIS — M25551 Pain in right hip: Secondary | ICD-10-CM | POA: Diagnosis not present

## 2020-08-03 DIAGNOSIS — E7800 Pure hypercholesterolemia, unspecified: Secondary | ICD-10-CM | POA: Diagnosis not present

## 2020-08-03 DIAGNOSIS — D649 Anemia, unspecified: Secondary | ICD-10-CM | POA: Diagnosis not present

## 2020-08-03 DIAGNOSIS — R2689 Other abnormalities of gait and mobility: Secondary | ICD-10-CM | POA: Diagnosis not present

## 2020-08-03 DIAGNOSIS — I10 Essential (primary) hypertension: Secondary | ICD-10-CM | POA: Diagnosis not present

## 2020-08-03 DIAGNOSIS — N3941 Urge incontinence: Secondary | ICD-10-CM

## 2020-08-03 NOTE — Progress Notes (Signed)
Subjective:  Patient ID: [name], female    DOB: 07/27/1950  Age: 70 y.o. MRN: [id number]  CC:  Chief Complaint  Patient presents with  . Transitions Of Care    PT reports she feels pretty good today. Pt states her recent surgery went well. Pt is l doing PT to recover.    HPI [name] presents for   Initially scheduled for transition of care, previous patient of Dr. Pamella Pert.  Chest pain: I last saw her 1 week ago for chest tightness.  Sent to ER due to increasing episodes.  ER note reviewed.  In the ER she was chest pain-free.  Noted to have symptoms with both rest and exertion.  Some tenderness central chest wall.  CBC noted for anemia of hemoglobin 9.7.  Glucose 118.  EKG without significant change from May 2017.  Troponin negative x2.Chest x-ray no active disease, mild linear scarring at the lung bases.  Outpatient cardiology follow-up planned.  Saw cardiology 2 days ago. Plan on stress test this Monday, new BP med added, and NTG Rx if needed for chest pain.  Few episodes of pressure/pain sensation in ears. Similar as last week, but less frequent.   Anemia: Low hemoglobin of 9.7 at recent ER visit on the 16th.  Previous hemoglobin 12.2 on 05/24/2020, down from 15.1 on 05/19/2020, 14.8 on 05/02/2020.  Denies near-syncope/syncope/DOE/fatigue/lightheadedness.  Hx of GERD, no hx of PUD, no melena, or hematochezia. No dark stools.  prior on PPI for 30 days in 03/2020. Continued famotidine 20mg  BID since that time.  Occasional mild heartburn- 3-4 times past few weeks.  Colonoscopy October 2018 showed diverticulosis otherwise normal.  Repeat screening in 10 years planned. Hip surgery 05/23/20  Lab Results  Component Value Date   WBC 5.9 07/27/2020   HGB 9.7 (L) 07/27/2020   HCT 33.4 (L) 07/27/2020   MCV 83.3 07/27/2020   PLT 313 07/27/2020     Hypertension: Amlodipine 2.5 mg daily. telmisartan hct 80/25mg  qd. ? New med added recently.  Home readings: BP  Readings from Last 3 Encounters:  08/03/20 118/79  07/28/20 124/66  07/27/20 137/84   Lab Results  Component Value Date   CREATININE 0.78 07/27/2020    Prediabetes: Stable A1c in December. Still in PT for hip.  Soda/sweet tea - rare, no fast food usually.  Lab Results  Component Value Date   HGBA1C 5.9 (H) 05/19/2020   Wt Readings from Last 3 Encounters:  08/03/20 228 lb (103.4 kg)  07/27/20 226 lb (102.5 kg)  05/23/20 227 lb 1 oz (103 kg)   Hyperlipidemia: Pravastatin daily -no new side effects.  Lab Results  Component Value Date   CHOL 176 03/14/2020   HDL 59 03/14/2020   LDLCALC 95 03/14/2020   TRIG 125 03/14/2020   CHOLHDL 3.0 03/14/2020   Lab Results  Component Value Date   ALT 16 05/02/2020   AST 20 05/02/2020   ALKPHOS 78 05/02/2020   BILITOT 0.7 05/02/2020   Urge incontinence: Controlled with Ditropan - no new side effects. Min dry mouth.   History Patient Active Problem List   Diagnosis Date Noted  . History of revision of total replacement of right hip joint 05/23/2020  . Prediabetes 07/21/2019  . 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 Arthritis of right hip 03/19/2016  . Primary osteoarthritis of right hip 03/18/2016  . Hypokalemia 03/29/2014  . Arthralgia 02/11/2014  . Routine general  medical examination at a health care facility 03/28/2013  . Nonspecific abnormal findings on radiological and examination of lung field 03/27/2013  . Cough 03/11/2013  . Unspecified disorder of liver 03/09/2013  . Pyuria 03/09/2013  . Arthritis   . Ventral hernia 01/18/2012  . Encounter for long-term (current) use of other medications 07/05/2011  . HYPERGLYCEMIA 08/12/2008  . HYPERCHOLESTEROLEMIA 04/25/2007  . DIZZINESS AND GIDDINESS 04/25/2007  . Essential hypertension 01/03/2007  . ALLERGIC RHINITIS 01/03/2007   Past Medical History:  Diagnosis Date  . Abdominal hernia    RLQ  .  Allergy   . Arthritis   . Arthritis   . Borderline diabetes   . GERD (gastroesophageal reflux disease)   . HYPERCHOLESTEROLEMIA 04/25/2007   takes Pravastatin daily  . HYPERTENSION 01/03/2007   takes Losartan-HCTZ  and Amlodipine daily  . Hypokalemia    takes Potassium daily  . Joint pain   . Nocturia   . Pneumonia 2 yrs ago  . Primary osteoarthritis of shoulder    Right   Past Surgical History:  Procedure Laterality Date  . CESAREAN SECTION  25 years ago  . CHOLECYSTECTOMY    . COLONOSCOPY    . DILATION AND CURETTAGE OF UTERUS  35 years ago  . HERNIA REPAIR    . INCISIONAL HERNIA REPAIR  02/21/2012   Procedure: LAPAROSCOPIC INCISIONAL HERNIA;  Surgeon: Harl Bowie, MD;  Location: [facility];  Service: General;  Laterality: N/A;  laparoscopic incisional hernia repair with mesh  . JOINT REPLACEMENT     bilateral knee replacements  . REPLACEMENT TOTAL KNEE BILATERAL  2010  . TOTAL HIP ARTHROPLASTY Right 03/19/2016   Procedure: RIGHT TOTAL HIP ARTHROPLASTY ANTERIOR APPROACH;  Surgeon: Frederik Pear, MD;  Location: [facility];  Service: Orthopedics;  Laterality: Right;  . TOTAL HIP REVISION Right 10/02/2017   Procedure: TOTAL HIP REVISION POSTERIOR;  Surgeon: Frederik Pear, MD;  Location: [facility];  Service: Orthopedics;  Laterality: Right;  . TOTAL HIP REVISION Right 05/23/2020   Procedure: RIGHT TOTAL POSTERIOR HIP REVISION;  Surgeon: Frederik Pear, MD;  Location[facility];  Service: Orthopedics;  Laterality: Right;  . TOTAL SHOULDER ARTHROPLASTY Right 12/19/2017   Procedure: RIGHT TOTAL SHOULDER ARTHROPLASTY;  Surgeon: Tania Ade, MD;  Location: [facility];  Service: Orthopedics;  Laterality: Right;  . TUBAL LIGATION     Allergies  Allergen Reactions  . Lisinopril Cough  . Oxycodone Other (See Comments)    Hallucinations   Prior to Admission medications   Medication Sig Start Date End Date Taking? Authorizing Provider  amLODipine (NORVASC) 2.5 MG tablet Take 1 tablet (2.5 mg total) by  mouth daily. 03/14/20  Yes Jacelyn Pi, Irma M, MD  Apoaequorin (PREVAGEN) 10 MG CAPS Take 10 mg by mouth daily.   Yes [provider]  aspirin EC 81 MG tablet Take 1 tablet (81 mg total) by mouth 2 (two) times daily. 05/23/20  Yes Leighton Parody, PA-C  Carboxymethylcellul-Glycerin (LUBRICATING EYE DROPS OP) Place 1 drop into both eyes daily as needed (dry eyes).   Yes [provider]  cetirizine-pseudoephedrine (ZYRTEC-D) 5-120 MG tablet Take 1 tablet by mouth daily as needed for allergies.   Yes [provider]  famotidine (PEPCID) 20 MG tablet TAKE 1 TABLET BY MOUTH TWICE A DAY 06/28/20  Yes Maximiano Coss, NP  Multiple Vitamin (MULTIVITAMIN WITH MINERALS) TABS tablet Take 1 tablet by mouth daily.   Yes [provider]  oxybutynin (DITROPAN) 5 MG tablet Take 1 tablet (5 mg  total) by mouth 2 (two) times daily. 03/14/20  Yes Jacelyn Pi, Lilia Argue, MD  potassium chloride SA (KLOR-CON M20) 20 MEQ tablet Take 1 tablet (20 mEq total) by mouth 2 (two) times daily. 03/14/20  Yes Jacelyn Pi, Lilia Argue, MD  pravastatin (PRAVACHOL) 20 MG tablet Take 1 tablet (20 mg total) by mouth every evening. 03/14/20  Yes Jacelyn Pi, Lilia Argue, MD  telmisartan-hydrochlorothiazide (MICARDIS HCT) 80-25 MG tablet Take 1 tablet by mouth daily. 03/14/20  Yes Jacelyn Pi, Lilia Argue, MD  triamcinolone cream (KENALOG) 0.1 % Apply 1 application topically 2 (two) times daily. 03/13/18  Yes Jacelyn Pi, Lilia Argue, MD  HYDROmorphone (DILAUDID) 2 MG tablet Take 1 tablet (2 mg total) by mouth every 4 (four) hours as needed for severe pain. Patient not taking: No sig reported 05/23/20   Leighton Parody, PA-C  Olopatadine HCl 0.2 % SOLN Apply 1 drop to eye daily. Patient not taking: No sig reported 07/21/19   Jacelyn Pi, Lilia Argue, MD  omeprazole (PRILOSEC) 20 MG capsule Take 1 capsule (20 mg total) by mouth daily as needed (reflux/heartburn). Patient not taking: No sig reported 03/14/20   Jacelyn Pi,  Lilia Argue, MD  tiZANidine (ZANAFLEX) 2 MG tablet Take 1 tablet (2 mg total) by mouth every 6 (six) hours as needed for muscle spasms. Patient not taking: No sig reported 05/23/20   Leighton Parody, PA-C   Social History   Socioeconomic History  . Marital status: Married    Spouse name: Not on file  . Number of children: 1  . Years of education: 12th grade +  . Highest education level: Not on file  Occupational History  . Occupation: Beautician  Tobacco Use  . Smoking status: Never Smoker  . Smokeless tobacco: Never Used  Vaping Use  . Vaping Use: Never used  Substance and Sexual Activity  . Alcohol use: No    Alcohol/week: 0.0 standard drinks  . Drug use: No  . Sexual activity: Not Currently    Partners: Male    Birth control/protection: Post-menopausal  Other Topics Concern  . Not on file  Social History Narrative   Lives with her husband.   Adult daughter lives nearby.   Social Determinants of Health   Financial Resource Strain: Not on file  Food Insecurity: Not on file  Transportation Needs: Not on file  Physical Activity: Not on file  Stress: Not on file  Social Connections: Not on file  Intimate Partner Violence: Not on file    Review of Systems Per HPI  Objective:   Vitals:   08/03/20 1507  BP: 118/79  Pulse: 94  Temp: 98.3 F (36.8 C)  TempSrc: Temporal  SpO2: 97%  Weight: 228 lb (103.4 kg)  Height: 5\' 3"  (1.6 m)     Physical Exam Vitals reviewed.  Constitutional:      Appearance: She is well-developed and well-nourished.  HENT:     Head: Normocephalic and atraumatic.  Eyes:     Extraocular Movements: EOM normal.     Conjunctiva/sclera: Conjunctivae normal.     Pupils: Pupils are equal, round, and reactive to light.  Neck:     Vascular: No carotid bruit.  Cardiovascular:     Rate and Rhythm: Normal rate and regular rhythm.     Pulses: Intact distal pulses.     Heart sounds: Normal heart sounds.  Pulmonary:     Effort: Pulmonary effort  is normal.     Breath sounds: Normal breath sounds.  Abdominal:     General: There is no distension.     Palpations: Abdomen is soft. There is no pulsatile mass.     Tenderness: There is no abdominal tenderness. There is no guarding.  Skin:    General: Skin is warm and dry.     Coloration: Skin is not pale.     Findings: No bruising or rash.  Neurological:     Mental Status: She is alert and oriented to person, place, and time.  Psychiatric:        Mood and Affect: Mood and affect normal.        Behavior: Behavior normal.      Assessment & Plan:  NATAKI MCCRUMB is a 70 y.o. female . Anemia, unspecified type - Plan: CBC with Differential/Platelet, Iron  -Most likely postoperative anemia given better readings in December.  Asymptomatic.  Check iron, repeat CBC, then decide on next step.  Denies symptoms of gastrointestinal bleeding at this time.  RTC/ER precautions given.  Chest tightness  -Overall improved, status post cardiology eval with planned stress testing.  Continue follow-up with cardiology, ER precautions.  Essential hypertension  -Stable, new med started by cardiology.  No changes at this time.  Pure hypercholesterolemia  -Tolerating current regimen, no changes  Urge incontinence  -Stable with Ditropan.  Continue same.  No orders of the defined types were placed in this encounter.  Patient Instructions    Keep follow-up as planned with cardiology.  If any worsening chest symptoms be seen.  I will recheck her blood counts today as they were lower last week then in December.  If any new lightheadedness, dizziness, worsening fatigue, dark stools or other new symptoms be seen right away.  Return to the clinic or go to the nearest emergency room if any of your symptoms worsen or new symptoms occur.    If you have lab work done today you will be contacted with your lab results within the next 2 weeks.  If you have not heard from [place] then please contact us. The  fastest way to get your results is to register for My Chart.   IF you received an x-ray today, you will receive an invoice from [facility]. Please contact [facility] Radiology at [phone] with questions or concerns regarding your invoice.   IF you received labwork today, you will receive an invoice from [facility]. Please contact [facility] at [phone] with questions or concerns regarding your invoice.   Our billing staff will not be able to assist you with questions regarding bills from these companies.  You will be contacted with the lab results as soon as they are available. The fastest way to get your results is to activate your My Chart account. Instructions are located on the last page of this paperwork. If you have not heard from [place] regarding the results in 2 weeks, please contact this office.         Signed, Merri Ray, MD [facility]

## 2020-08-03 NOTE — Therapy (Signed)
[facility] [address] [place], [place], [address] Phone: [phone]   Fax:  [phone]  Physical Therapy Treatment  Patient Details  Name: Angela Estrada MRN: [id number] Date of Birth: 1951-01-26 Referring Provider (PT): Frederik Pear MD  Progress Note Reporting Period 06/15/20 to 08/03/20  See note below for Objective Data and Assessment of Progress/Goals.       Encounter Date: 08/03/2020   PT End of Session - 08/03/20 1038    Visit Number 10    Number of Visits 12    Date for PT Re-Evaluation 08/17/20    Authorization Type [employer]    Progress Note Due on Visit 10    PT Start Time 1032    PT Stop Time 1115    PT Time Calculation (min) 43 min    Equipment Utilized During Treatment Gait belt    Activity Tolerance Patient tolerated treatment well    Behavior During Therapy WFL for tasks assessed/performed           Past Medical History:  Diagnosis Date  . Abdominal hernia    RLQ  . Allergy   . Arthritis   . Arthritis   . Borderline diabetes   . GERD (gastroesophageal reflux disease)   . HYPERCHOLESTEROLEMIA 04/25/2007   takes Pravastatin daily  . HYPERTENSION 01/03/2007   takes Losartan-HCTZ  and Amlodipine daily  . Hypokalemia    takes Potassium daily  . Joint pain   . Nocturia   . Pneumonia 2 yrs ago  . Primary osteoarthritis of shoulder    Right    Past Surgical History:  Procedure Laterality Date  . CESAREAN SECTION  25 years ago  . CHOLECYSTECTOMY    . COLONOSCOPY    . DILATION AND CURETTAGE OF UTERUS  35 years ago  . HERNIA REPAIR    . INCISIONAL HERNIA REPAIR  02/21/2012   Procedure: LAPAROSCOPIC INCISIONAL HERNIA;  Surgeon: Harl Bowie, MD;  Location: [facility];  Service: General;  Laterality: N/A;  laparoscopic incisional hernia repair with mesh  . JOINT REPLACEMENT     bilateral knee replacements  . REPLACEMENT TOTAL KNEE BILATERAL  2010  . TOTAL HIP ARTHROPLASTY Right 03/19/2016    Procedure: RIGHT TOTAL HIP ARTHROPLASTY ANTERIOR APPROACH;  Surgeon: Frederik Pear, MD;  Location: [facility];  Service: Orthopedics;  Laterality: Right;  . TOTAL HIP REVISION Right 10/02/2017   Procedure: TOTAL HIP REVISION POSTERIOR;  Surgeon: Frederik Pear, MD;  Location: [facility];  Service: Orthopedics;  Laterality: Right;  . TOTAL HIP REVISION Right 05/23/2020   Procedure: RIGHT TOTAL POSTERIOR HIP REVISION;  Surgeon: Frederik Pear, MD;  [facility];  Service: Orthopedics;  Laterality: Right;  . TOTAL SHOULDER ARTHROPLASTY Right 12/19/2017   Procedure: RIGHT TOTAL SHOULDER ARTHROPLASTY;  Surgeon: Tania Ade, MD;  [facility];  Service: Orthopedics;  Laterality: Right;  . TUBAL LIGATION      There were no vitals filed for this visit.   Subjective Assessment - 08/03/20 1037    Subjective Patient says she is doing well and her hip is good. She had to go to hospital last week for chest pains. She was cleared with an EKG, but she has an upcoming stress test this week. She also had some fluid on her knee which she has been icing, otherwise things are good and her follow up with ortho MD went well. No pain currently. She reports 60% improvement with hip. Still feels she has some issues with  strength and balance.    Limitations Sitting;Lifting;Standing;Walking;House hold activities    Currently in Pain? No/denies    Pain Onset 1 to 4 weeks ago              [facility] PT Assessment - 08/03/20 0001      Assessment   Medical Diagnosis RT THA    Referring Provider (PT) Frederik Pear MD    Onset Date/Surgical Date 05/23/20    Prior Therapy yes HH      Precautions   Precautions Fall      Restrictions   Weight Bearing Restrictions No      Balance Screen   Has the patient fallen in the past 6 months No   none since starting therapy     [place] residence    Living Arrangements Spouse/significant other      Prior Function   Level of Independence  Independent      Cognition   Overall Cognitive Status Within Functional Limits for tasks assessed      Observation/Other Assessments   Focus on Therapeutic Outcomes (FOTO)  63% function   was 52%     Balance   Balance Assessed Yes      Static Standing Balance   Static Standing Balance -  Activities  Single Leg Stance - Right Leg;Single Leg Stance - Left Leg    Static Standing - Comment/# of Minutes 18 sec, 7 sec                         [facility] Adult PT Treatment/Exercise - 08/03/20 0001      Transfers   Five time sit to stand comments  14.8 sec with no UE   was 23.5 seconds     Ambulation/Gait   Ambulation/Gait Yes    Ambulation/Gait Assistance 6: Modified independent (Device/Increase time)    Ambulation Distance (Feet) 280 Feet    Assistive device Straight cane    Gait Pattern Decreased step length - left;Decreased stance time - right;Decreased stride length;Decreased dorsiflexion - right    Ambulation Surface Level;Indoor    Gait Comments 2MWT      Knee/Hip Exercises: Aerobic   Recumbent Bike 4 min seat 11 for dynamic warmup      Knee/Hip Exercises: Machines for Strengthening   Cybex Leg Press 30# 2 x 10      Knee/Hip Exercises: Standing   Hip Abduction Both;2 sets;10 reps    Abduction Limitations GTB    Hip Extension Both;2 sets;10 reps    Extension Limitations GTB    Other Standing Knee Exercises tandem stance 3 x 20" solid floor                    PT Short Term Goals - 08/03/20 1052      PT SHORT TERM GOAL #1   Title Patient will be independent with initial HEP and self-management strategies to improve functional outcomes    Baseline Reports compliance    Time 2    Period Weeks    Status Achieved    Target Date 07/01/20             PT Long Term Goals - 08/03/20 1053      PT LONG TERM GOAL #1   Title Patient will improve FOTO score by 10% to indicate improvement in functional outcomes    Baseline Improved by 11%    Time 6     Period  Weeks    Status Achieved      PT LONG TERM GOAL #2   Title Patient will report at least 75% overall improvement in subjective complaint to indicate improvement in ability to perform ADLs.    Baseline Reports 60% improvement    Time 6    Period Weeks    Status On-going      PT LONG TERM GOAL #3   Title Patient will be able to perform stand x 5 in < 15 seconds to demonstrate improvement in functional mobility and reduced risk for falls.    Baseline Current 14.8 seconds with no UE    Time 6    Period Weeks    Status Achieved      PT LONG TERM GOAL #4   Title Patient will be able to ambulate at least 350 feet during 2MWT with LRAD to demonstrate improved ability to perform functional mobility and associated tasks.    Baseline Current 280 feet with SPC and mild gait deviations    Time 6    Period Weeks    Status On-going                 Plan - 08/03/20 1142    Clinical Impression Statement Patient is progressing well overall. She shows improvement with stairs and transfers, but remains limited by mild RT hip weakness and balance difficulty. Patient will continue to benefit from skilled therapy services to address remaining deficits for improved functional mobility and decreased risk for falls.    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    Stability/Clinical Decision Making Stable/Uncomplicated    Rehab Potential Good    PT Frequency 1x / week    PT Duration 3 weeks    PT Treatment/Interventions ADLs/Self Care Home Management;Aquatic Therapy;Biofeedback;Cryotherapy;Electrical Stimulation;Gait training;Stair training;Functional mobility training;Orthotic Fit/Training;Manual techniques;Energy conservation;Splinting;Taping;Vasopneumatic Device;Therapeutic activities;Iontophoresis 4mg /ml  Dexamethasone;Therapeutic exercise;Moist Heat;Traction;Balance training;Manual lymph drainage;Dry needling;Joint Manipulations;Spinal Manipulations;Compression bandaging;Visual/perceptual remediation/compensation;Scar mobilization;Neuromuscular re-education;Ultrasound;Parrafin;Fluidtherapy;Contrast Bath;Patient/family education;DME Instruction;Passive range of motion    PT Next Visit Plan Continue to progress hip strength, gait and balance. add dynamic balance drills. continue with machines and ankle weights, add weighted hip flexion.    PT Home Exercise Plan Eval: added heel slides (patient reports already doing heel raises, standing hip abduction/ extension). 1/10: sit to stand, sitting heel raise/toe raise, functional squat, semi tandem stance and forward step up. Staggered stance with UE flexion overhead 1/31: supported single leg stance    Consulted and Agree with Plan of Care Patient           Patient will benefit from skilled therapeutic intervention in order to improve the following deficits and impairments:  Abnormal gait,Decreased range of motion,Decreased balance,Decreased mobility,Difficulty walking,Pain,Decreased strength  Visit Diagnosis: Pain in right hip  Other abnormalities of gait and mobility  Muscle weakness (generalized)     Problem List Patient Active Problem List   Diagnosis Date Noted  . History of revision of total replacement of right hip joint 05/23/2020  . Prediabetes 07/21/2019  . 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 Arthritis of right hip 03/19/2016  . Primary osteoarthritis of right hip 03/18/2016  . Hypokalemia 03/29/2014  . Arthralgia 02/11/2014  . Routine general medical examination at a health care facility 03/28/2013  . Nonspecific abnormal findings on radiological and examination of lung field 03/27/2013  . Cough 03/11/2013  . Unspecified disorder  of liver 03/09/2013  .  Pyuria 03/09/2013  . Arthritis   . Ventral hernia 01/18/2012  . Encounter for long-term (current) use of other medications 07/05/2011  . HYPERGLYCEMIA 08/12/2008  . HYPERCHOLESTEROLEMIA 04/25/2007  . DIZZINESS AND GIDDINESS 04/25/2007  . Essential hypertension 01/03/2007  . ALLERGIC RHINITIS 01/03/2007   11:50 AM, 08/03/20 Josue Hector PT DPT  Physical Therapist with [facility]  [phone] [facility] [address] [place], [place], [address] Phone: [phone]   Fax:  [phone]  Name: LETITA PRENTISS MRN: [id number] Date of Birth: 1951-05-22

## 2020-08-03 NOTE — Patient Instructions (Addendum)
  Keep follow-up as planned with cardiology.  If any worsening chest symptoms be seen.  I will recheck her blood counts today as they were lower last week then in December.  If any new lightheadedness, dizziness, worsening fatigue, dark stools or other new symptoms be seen right away.  Return to the clinic or go to the nearest emergency room if any of your symptoms worsen or new symptoms occur.    If you have lab work done today you will be contacted with your lab results within the next 2 weeks.  If you have not heard from [place] then please contact us. The fastest way to get your results is to register for My Chart.   IF you received an x-ray today, you will receive an invoice from [facility] Radiology. Please contact [facility] Radiology at [phone] with questions or concerns regarding your invoice.   IF you received labwork today, you will receive an invoice from [facility]. Please contact [facility] at [phone] with questions or concerns regarding your invoice.   Our billing staff will not be able to assist you with questions regarding bills from these companies.  You will be contacted with the lab results as soon as they are available. The fastest way to get your results is to activate your My Chart account. Instructions are located on the last page of this paperwork. If you have not heard from [place] regarding the results in 2 weeks, please contact this office.

## 2020-08-04 LAB — CBC WITH DIFFERENTIAL/PLATELET
Basophils Absolute: 0.1 10*3/uL (ref 0.0–0.2)
Basos: 1 %
EOS (ABSOLUTE): 0.3 10*3/uL (ref 0.0–0.4)
Eos: 3 %
Hematocrit: 42.2 % (ref 34.0–46.6)
Hemoglobin: 14.2 g/dL (ref 11.1–15.9)
Immature Grans (Abs): 0 10*3/uL (ref 0.0–0.1)
Immature Granulocytes: 0 %
Lymphocytes Absolute: 2.3 10*3/uL (ref 0.7–3.1)
Lymphs: 30 %
MCH: 30.3 pg (ref 26.6–33.0)
MCHC: 33.6 g/dL (ref 31.5–35.7)
MCV: 90 fL (ref 79–97)
Monocytes Absolute: 0.7 10*3/uL (ref 0.1–0.9)
Monocytes: 9 %
Neutrophils Absolute: 4.3 10*3/uL (ref 1.4–7.0)
Neutrophils: 57 %
Platelets: 279 10*3/uL (ref 150–450)
RBC: 4.68 x10E6/uL (ref 3.77–5.28)
RDW: 13 % (ref 11.7–15.4)
WBC: 7.6 10*3/uL (ref 3.4–10.8)

## 2020-08-04 LAB — IRON: Iron: 70 ug/dL (ref 27–139)

## 2020-08-08 ENCOUNTER — Ambulatory Visit (HOSPITAL_COMMUNITY)
Admission: RE | Admit: 2020-08-08 | Discharge: 2020-08-08 | Disposition: A | Discharge status: Home / Self Care | Payer: Medicare HMO | Source: Ambulatory Visit | Attending: Cardiology | Admitting: Cardiology

## 2020-08-08 ENCOUNTER — Other Ambulatory Visit: Payer: Self-pay

## 2020-08-08 ENCOUNTER — Ambulatory Visit (HOSPITAL_COMMUNITY): Payer: Medicare HMO

## 2020-08-08 ENCOUNTER — Encounter (HOSPITAL_COMMUNITY): Payer: Self-pay

## 2020-08-08 DIAGNOSIS — R7303 Prediabetes: Secondary | ICD-10-CM | POA: Diagnosis not present

## 2020-08-08 DIAGNOSIS — I10 Essential (primary) hypertension: Secondary | ICD-10-CM | POA: Diagnosis not present

## 2020-08-08 DIAGNOSIS — R079 Chest pain, unspecified: Secondary | ICD-10-CM | POA: Diagnosis not present

## 2020-08-08 DIAGNOSIS — E785 Hyperlipidemia, unspecified: Secondary | ICD-10-CM | POA: Diagnosis not present

## 2020-08-08 DIAGNOSIS — R0789 Other chest pain: Secondary | ICD-10-CM | POA: Diagnosis not present

## 2020-08-08 MED ORDER — TECHNETIUM TC 99M TETROFOSMIN IV KIT
10.6000 | PACK | Freq: Once | INTRAVENOUS | Status: AC | PRN
  Administered 2020-08-08: 10.6 via INTRAVENOUS

## 2020-08-08 MED ORDER — REGADENOSON 0.4 MG/5ML IV SOLN
INTRAVENOUS | Status: AC
  Administered 2020-08-08: 0.4 mg via INTRAVENOUS
  Filled 2020-08-08: qty 5

## 2020-08-08 MED ORDER — REGADENOSON 0.4 MG/5ML IV SOLN: 0.4000 mg | Freq: Once | INTRAVENOUS | Status: AC

## 2020-08-08 MED ORDER — TECHNETIUM TC 99M TETROFOSMIN IV KIT
30.6000 | PACK | Freq: Once | INTRAVENOUS | Status: AC | PRN
  Administered 2020-08-08: 30.6 via INTRAVENOUS

## 2020-08-09 ENCOUNTER — Telehealth: Payer: Self-pay | Admitting: Family Medicine

## 2020-08-09 NOTE — Telephone Encounter (Signed)
Called pt to discuss and made follow up as she is having a hard time understanding some labs and wants to discuss stress test

## 2020-08-09 NOTE — Telephone Encounter (Signed)
Pt found out her Hemoglobin is low 9.7 from [facility] 07/27/20. In epic. She would like to know what she can do about this issue. Please advise at [phone].

## 2020-08-10 ENCOUNTER — Ambulatory Visit (HOSPITAL_COMMUNITY): Payer: Medicare HMO | Attending: Orthopedic Surgery | Admitting: Physical Therapy

## 2020-08-10 ENCOUNTER — Encounter (HOSPITAL_COMMUNITY): Payer: Self-pay | Admitting: Physical Therapy

## 2020-08-10 ENCOUNTER — Other Ambulatory Visit: Payer: Self-pay

## 2020-08-10 DIAGNOSIS — M6281 Muscle weakness (generalized): Secondary | ICD-10-CM | POA: Diagnosis not present

## 2020-08-10 DIAGNOSIS — R2689 Other abnormalities of gait and mobility: Secondary | ICD-10-CM | POA: Diagnosis not present

## 2020-08-10 DIAGNOSIS — M25551 Pain in right hip: Secondary | ICD-10-CM | POA: Diagnosis not present

## 2020-08-10 NOTE — Therapy (Signed)
[facility] [address] [place], [place], [address] Phone: [phone]   Fax:  [phone]  Physical Therapy Treatment  Patient Details  Name: Angela Estrada MRN: [id number] Date of Birth: August 26, 1950 Referring Provider (PT): Frederik Pear MD   Encounter Date: 08/10/2020   PT End of Session - 08/10/20 1356    Visit Number 11    Number of Visits 12    Date for PT Re-Evaluation 08/17/20    Authorization Type [employer]    Progress Note Due on Visit 10    PT Start Time 1350    PT Stop Time 1430    PT Time Calculation (min) 40 min    Equipment Utilized During Treatment Gait belt    Activity Tolerance Patient tolerated treatment well    Behavior During Therapy [facility] for tasks assessed/performed           Past Medical History:  Diagnosis Date  . Abdominal hernia    RLQ  . Allergy   . Arthritis   . Arthritis   . Borderline diabetes   . GERD (gastroesophageal reflux disease)   . HYPERCHOLESTEROLEMIA 04/25/2007   takes Pravastatin daily  . HYPERTENSION 01/03/2007   takes Losartan-HCTZ  and Amlodipine daily  . Hypokalemia    takes Potassium daily  . Joint pain   . Nocturia   . Pneumonia 2 yrs ago  . Primary osteoarthritis of shoulder    Right    Past Surgical History:  Procedure Laterality Date  . CESAREAN SECTION  25 years ago  . CHOLECYSTECTOMY    . COLONOSCOPY    . DILATION AND CURETTAGE OF UTERUS  35 years ago  . HERNIA REPAIR    . INCISIONAL HERNIA REPAIR  02/21/2012   Procedure: LAPAROSCOPIC INCISIONAL HERNIA;  Surgeon: Harl Bowie, MD;  Location: [place];  Service: General;  Laterality: N/A;  laparoscopic incisional hernia repair with mesh  . JOINT REPLACEMENT     bilateral knee replacements  . REPLACEMENT TOTAL KNEE BILATERAL  2010  . TOTAL HIP ARTHROPLASTY Right 03/19/2016   Procedure: RIGHT TOTAL HIP ARTHROPLASTY ANTERIOR APPROACH;  Surgeon: Frederik Pear, MD;  Location: [place];  Service: Orthopedics;   Laterality: Right;  . TOTAL HIP REVISION Right 10/02/2017   Procedure: TOTAL HIP REVISION POSTERIOR;  Surgeon: Frederik Pear, MD;  Location: [place];  Service: Orthopedics;  Laterality: Right;  . TOTAL HIP REVISION Right 05/23/2020   Procedure: RIGHT TOTAL POSTERIOR HIP REVISION;  Surgeon: Frederik Pear, MD;  [facility];  Service: Orthopedics;  Laterality: Right;  . TOTAL SHOULDER ARTHROPLASTY Right 12/19/2017   Procedure: RIGHT TOTAL SHOULDER ARTHROPLASTY;  Surgeon: Tania Ade, MD;  Location[facility];  Service: Orthopedics;  Laterality: Right;  . TUBAL LIGATION      There were no vitals filed for this visit.   Subjective Assessment - 08/10/20 1355    Subjective Patient report no new issues. She says she is doing good, except still needs to work on her balance.    Limitations Sitting;Lifting;Standing;Walking;House hold activities    Currently in Pain? No/denies    Pain Onset 1 to 4 weeks ago                             [facility] Adult PT Treatment/Exercise - 08/10/20 0001      Knee/Hip Exercises: Aerobic   Recumbent Bike 4 min seat 11 for dynamic warmup  Knee/Hip Exercises: Standing   Knee Flexion Strengthening;2 sets;10 reps;Both    Knee Flexion Limitations 2#    Hip Flexion Both;1 set;20 reps    Hip Flexion Limitations marching   2#   Hip Abduction Both;2 sets;10 reps    Abduction Limitations 2#    Stairs 3RT 4 inch recpirocal single hand rail    Other Standing Knee Exercises tandem stance 2 x 30" solid floor int HHA               Balance Exercises - 08/10/20 0001      Balance Exercises: Standing   SLS Eyes open;3 reps;15 secs;Intermittent upper extremity support    Gait with Head Turns 2 reps   with [facility]   Tandem Gait Forward;2 reps   with SPC   Sidestepping 2 reps   with [facility]              PT Short Term Goals - 08/03/20 1052      PT SHORT TERM GOAL #1   Title Patient will be independent with initial HEP and self-management strategies  to improve functional outcomes    Baseline Reports compliance    Time 2    Period Weeks    Status Achieved    Target Date 07/01/20             PT Long Term Goals - 08/03/20 1053      PT LONG TERM GOAL #1   Title Patient will improve FOTO score by 10% to indicate improvement in functional outcomes    Baseline Improved by 11%    Time 6    Period Weeks    Status Achieved      PT LONG TERM GOAL #2   Title Patient will report at least 75% overall improvement in subjective complaint to indicate improvement in ability to perform ADLs.    Baseline Reports 60% improvement    Time 6    Period Weeks    Status On-going      PT LONG TERM GOAL #3   Title Patient will be able to perform stand x 5 in < 15 seconds to demonstrate improvement in functional mobility and reduced risk for falls.    Baseline Current 14.8 seconds with no UE    Time 6    Period Weeks    Status Achieved      PT LONG TERM GOAL #4   Title Patient will be able to ambulate at least 350 feet during 2MWT with LRAD to demonstrate improved ability to perform functional mobility and associated tasks.    Baseline Current 280 feet with SPC and mild gait deviations    Time 6    Period Weeks    Status On-going                 Plan - 08/10/20 1428    Clinical Impression Statement Patient continues to be limited by functional RLE weakness. She does well with isolated exercises, but still shows decreased weight shift and difficulty putting weight on RLE with ascending stairs. She does well with descending. Also still quite challenged with static balance, though improved since starting therapy. She still requires intermittent HHA for stability with tandem stance and single leg balance. Added dynamic balance training. Patient tolerates this well but feels unsafe without use of SPC for additional support. Patient will benefit from balance progressions to reduce risk for falls. Likely to transition to DC with HEP next session.     Personal Factors and  Comorbidities Past/Current Experience    Examination-Activity Limitations Locomotion Level;Transfers;Stairs;Stand    Examination-Participation Restrictions Art gallery manager;Valla Leaver [facility]    Stability/Clinical Decision Making Stable/Uncomplicated    Rehab Potential Good    PT Frequency 1x / week    PT Duration 3 weeks    PT Treatment/Interventions ADLs/Self Care Home Management;Aquatic Therapy;Biofeedback;Cryotherapy;Electrical Stimulation;Gait training;Stair training;Functional mobility training;Orthotic Fit/Training;Manual techniques;Energy conservation;Splinting;Taping;Vasopneumatic Device;Therapeutic activities;Iontophoresis 4mg /ml Dexamethasone;Therapeutic exercise;Moist Heat;Traction;Balance training;Manual lymph drainage;Dry needling;Joint Manipulations;Spinal Manipulations;Compression bandaging;Visual/perceptual remediation/compensation;Scar mobilization;Neuromuscular re-education;Ultrasound;Parrafin;Fluidtherapy;Contrast Bath;Patient/family education;DME Instruction;Passive range of motion    PT Next Visit Plan Review HEP. Mini reassess, DC to HEP if indicated    PT Home Exercise Plan Eval: added heel slides (patient reports already doing heel raises, standing hip abduction/ extension). 1/10: sit to stand, sitting heel raise/toe raise, functional squat, semi tandem stance and forward step up. Staggered stance with UE flexion overhead 1/31: supported single leg stance    Consulted and Agree with Plan of Care Patient           Patient will benefit from skilled therapeutic intervention in order to improve the following deficits and impairments:  Abnormal gait,Decreased range of motion,Decreased balance,Decreased mobility,Difficulty walking,Pain,Decreased strength  Visit Diagnosis: Pain in right hip  Other abnormalities of gait and mobility  Muscle weakness (generalized)     Problem List Patient Active Problem List   Diagnosis Date Noted  .  History of revision of total replacement of right hip joint 05/23/2020  . Prediabetes 07/21/2019  . 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 Arthritis of right hip 03/19/2016  . Primary osteoarthritis of right hip 03/18/2016  . Hypokalemia 03/29/2014  . Arthralgia 02/11/2014  . Routine general medical examination at a health care facility 03/28/2013  . Nonspecific abnormal findings on radiological and examination of lung field 03/27/2013  . Cough 03/11/2013  . Unspecified disorder of liver 03/09/2013  . Pyuria 03/09/2013  . Arthritis   . Ventral hernia 01/18/2012  . Encounter for long-term (current) use of other medications 07/05/2011  . HYPERGLYCEMIA 08/12/2008  . HYPERCHOLESTEROLEMIA 04/25/2007  . DIZZINESS AND GIDDINESS 04/25/2007  . Essential hypertension 01/03/2007  . ALLERGIC RHINITIS 01/03/2007   2:36 PM, 08/10/20 Josue Hector PT DPT  Physical Therapist with [facility]  [phone]   [facility] [address] [place], [place], [address] Phone: [phone]   Fax:  [phone]  Name: LUVA METZGER MRN: [id number] Date of Birth: 08-Sep-1950

## 2020-08-11 DIAGNOSIS — N3941 Urge incontinence: Secondary | ICD-10-CM | POA: Diagnosis not present

## 2020-08-11 DIAGNOSIS — M1990 Unspecified osteoarthritis, unspecified site: Secondary | ICD-10-CM | POA: Diagnosis not present

## 2020-08-11 DIAGNOSIS — Z6841 Body Mass Index (BMI) 40.0 and over, adult: Secondary | ICD-10-CM | POA: Diagnosis not present

## 2020-08-11 DIAGNOSIS — I10 Essential (primary) hypertension: Secondary | ICD-10-CM | POA: Diagnosis not present

## 2020-08-11 DIAGNOSIS — E785 Hyperlipidemia, unspecified: Secondary | ICD-10-CM | POA: Diagnosis not present

## 2020-08-11 DIAGNOSIS — Z7722 Contact with and (suspected) exposure to environmental tobacco smoke (acute) (chronic): Secondary | ICD-10-CM | POA: Diagnosis not present

## 2020-08-11 DIAGNOSIS — K219 Gastro-esophageal reflux disease without esophagitis: Secondary | ICD-10-CM | POA: Diagnosis not present

## 2020-08-11 DIAGNOSIS — G8929 Other chronic pain: Secondary | ICD-10-CM | POA: Diagnosis not present

## 2020-08-11 DIAGNOSIS — J302 Other seasonal allergic rhinitis: Secondary | ICD-10-CM | POA: Diagnosis not present

## 2020-08-12 ENCOUNTER — Encounter: Payer: Self-pay | Admitting: Family Medicine

## 2020-08-12 ENCOUNTER — Ambulatory Visit (INDEPENDENT_AMBULATORY_CARE_PROVIDER_SITE_OTHER): Payer: Medicare HMO | Admitting: Family Medicine

## 2020-08-12 ENCOUNTER — Other Ambulatory Visit: Payer: Self-pay

## 2020-08-12 VITALS — BP 130/65 | HR 73 | Temp 97.7°F | Ht 62.0 in | Wt 228.0 lb

## 2020-08-12 DIAGNOSIS — D649 Anemia, unspecified: Secondary | ICD-10-CM | POA: Diagnosis not present

## 2020-08-12 DIAGNOSIS — K625 Hemorrhage of anus and rectum: Secondary | ICD-10-CM

## 2020-08-12 DIAGNOSIS — M542 Cervicalgia: Secondary | ICD-10-CM | POA: Diagnosis not present

## 2020-08-12 DIAGNOSIS — Z87898 Personal history of other specified conditions: Secondary | ICD-10-CM | POA: Diagnosis not present

## 2020-08-12 DIAGNOSIS — R42 Dizziness and giddiness: Secondary | ICD-10-CM | POA: Diagnosis not present

## 2020-08-12 NOTE — Progress Notes (Signed)
Subjective:  Patient ID: Angela Estrada, female    DOB: 1951-02-27  Age: 70 y.o. MRN: [id number]  CC:  Chief Complaint  Patient presents with  . lab folllow up     Questions about Stress test results/ iron level results.     HPI Angela Estrada presents for   Chest pain See last office visit February 23, overall improved at that time and status post cardiology eval with stress testing.  Cardiology adjusted hypertension medications, now on Toprol-XL 25 mg daily, potassium 20 mg daily, Micardis HCT 80/25 and  amlodipine 2.5 mg daily.  She has remained on pravastatin 20 mg daily.  Myocardial imaging with SPECT on 08/08/2020, ordered by Dr. Terrence Dupont.  Negative for ischemia, small anterior wall focus of attenuation versus scar.  Normal left ventricular wall motion.  EF 62%.  Low noninvasive risk stratification. Did have call from cardiology-  Told looked good.  No further chest pain. Still some pain moving up neck toward ears at times. Last episode few days ago. Usually R side, sometimes both, no posterior neck pain.  Rare lightheadededness, no syncope/near syncope. Allergies with sinus HA at times.   Anemia Previously low hemoglobin from ER visit at 9.7.  Repeat hemoglobin at February 23 visit was normal at 14.2 with normal iron.  History of GERD but no history of peptic ulcer disease or dark stools.  Has continued on famotidine 20 mg twice daily with minimal heartburn breakthrough.  Up-to-date on colonoscopy, 2019 with diverticulosis. Small amt bright blood with wiping after hard stool. Usually regular - constipation few weeks ago.  Has not noticed symptoms when not constipated, no current symptoms.  Results for orders placed or performed in visit on 08/03/20  CBC with Differential/Platelet  Result Value Ref Range   WBC 7.6 3.4 - 10.8 x10E3/uL   RBC 4.68 3.77 - 5.28 x10E6/uL   Hemoglobin 14.2 11.1 - 15.9 g/dL   Hematocrit 42.2 34.0 - 46.6 %   MCV 90 79 - 97 fL   MCH 30.3 26.6 -  33.0 pg   MCHC 33.6 31.5 - 35.7 g/dL   RDW 13.0 11.7 - 15.4 %   Platelets 279 150 - 450 x10E3/uL   Neutrophils 57 Not Estab. %   Lymphs 30 Not Estab. %   Monocytes 9 Not Estab. %   Eos 3 Not Estab. %   Basos 1 Not Estab. %   Neutrophils Absolute 4.3 1.4 - 7.0 x10E3/uL   Lymphocytes Absolute 2.3 0.7 - 3.1 x10E3/uL   Monocytes Absolute 0.7 0.1 - 0.9 x10E3/uL   EOS (ABSOLUTE) 0.3 0.0 - 0.4 x10E3/uL   Basophils Absolute 0.1 0.0 - 0.2 x10E3/uL   Immature Granulocytes 0 Not Estab. %   Immature Grans (Abs) 0.0 0.0 - 0.1 x10E3/uL  Iron  Result Value Ref Range   Iron 70 27 - 139 ug/dL     History Patient Active Problem List   Diagnosis Date Noted  . History of revision of total replacement of right hip joint 05/23/2020  . Prediabetes 07/21/2019  . 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 Arthritis of right hip 03/19/2016  . Primary osteoarthritis of right hip 03/18/2016  . Hypokalemia 03/29/2014  . Arthralgia 02/11/2014  . Routine general medical examination at a health care facility 03/28/2013  . Nonspecific abnormal findings on radiological and examination of lung field 03/27/2013  . Cough 03/11/2013  . Unspecified disorder of liver 03/09/2013  .  Pyuria 03/09/2013  . Arthritis   . Ventral hernia 01/18/2012  . Encounter for long-term (current) use of other medications 07/05/2011  . HYPERGLYCEMIA 08/12/2008  . HYPERCHOLESTEROLEMIA 04/25/2007  . DIZZINESS AND GIDDINESS 04/25/2007  . Essential hypertension 01/03/2007  . ALLERGIC RHINITIS 01/03/2007   Past Medical History:  Diagnosis Date  . Abdominal hernia    RLQ  . Allergy   . Arthritis   . Arthritis   . Borderline diabetes   . GERD (gastroesophageal reflux disease)   . HYPERCHOLESTEROLEMIA 04/25/2007   takes Pravastatin daily  . HYPERTENSION 01/03/2007   takes Losartan-HCTZ  and Amlodipine daily  . Hypokalemia    takes Potassium daily  . Joint  pain   . Nocturia   . Pneumonia 2 yrs ago  . Primary osteoarthritis of shoulder    Right   Past Surgical History:  Procedure Laterality Date  . CESAREAN SECTION  25 years ago  . CHOLECYSTECTOMY    . COLONOSCOPY    . DILATION AND CURETTAGE OF UTERUS  35 years ago  . HERNIA REPAIR    . INCISIONAL HERNIA REPAIR  02/21/2012   Procedure: LAPAROSCOPIC INCISIONAL HERNIA;  Surgeon: Harl Bowie, MD;  Location: [place];  Service: General;  Laterality: N/A;  laparoscopic incisional hernia repair with mesh  . JOINT REPLACEMENT     bilateral knee replacements  . REPLACEMENT TOTAL KNEE BILATERAL  2010  . TOTAL HIP ARTHROPLASTY Right 03/19/2016   Procedure: RIGHT TOTAL HIP ARTHROPLASTY ANTERIOR APPROACH;  Surgeon: Frederik Pear, MD;  Location: [place];  Service: Orthopedics;  Laterality: Right;  . TOTAL HIP REVISION Right 10/02/2017   Procedure: TOTAL HIP REVISION POSTERIOR;  Surgeon: Frederik Pear, MD;  Location: [place];  Service: Orthopedics;  Laterality: Right;  . TOTAL HIP REVISION Right 05/23/2020   Procedure: RIGHT TOTAL POSTERIOR HIP REVISION;  Surgeon: Frederik Pear, MD;  Location[facility];  Service: Orthopedics;  Laterality: Right;  . TOTAL SHOULDER ARTHROPLASTY Right 12/19/2017   Procedure: RIGHT TOTAL SHOULDER ARTHROPLASTY;  Surgeon: Tania Ade, MD;  Location[facility];  Service: Orthopedics;  Laterality: Right;  . TUBAL LIGATION     Allergies  Allergen Reactions  . Lisinopril Cough  . Oxycodone Other (See Comments)    Hallucinations   Prior to Admission medications   Medication Sig Start Date End Date Taking? Authorizing Provider  amLODipine (NORVASC) 2.5 MG tablet Take 1 tablet (2.5 mg total) by mouth daily. 03/14/20  Yes Jacelyn Pi, Irma M, MD  Apoaequorin (PREVAGEN) 10 MG CAPS Take 10 mg by mouth daily.   Yes [provider]  aspirin EC 81 MG tablet Take 1 tablet (81 mg total) by mouth 2 (two) times daily. 05/23/20  Yes Leighton Parody, PA-C   Carboxymethylcellul-Glycerin (LUBRICATING EYE DROPS OP) Place 1 drop into both eyes daily as needed (dry eyes).   Yes [provider]  cetirizine-pseudoephedrine (ZYRTEC-D) 5-120 MG tablet Take 1 tablet by mouth daily as needed for allergies.   Yes [provider]  famotidine (PEPCID) 20 MG tablet TAKE 1 TABLET BY MOUTH TWICE A DAY 06/28/20  Yes Maximiano Coss, NP  metoprolol succinate (TOPROL-XL) 25 MG 24 hr tablet Take 25 mg by mouth daily. 08/03/20  Yes [provider]  Multiple Vitamin (MULTIVITAMIN WITH MINERALS) TABS tablet Take 1 tablet by mouth daily.   Yes [provider]  oxybutynin (DITROPAN) 5 MG tablet Take 1 tablet (5 mg total) by mouth 2 (two) times daily. 03/14/20  Yes Jacelyn Pi, [place]  M, MD  potassium chloride SA (KLOR-CON M20) 20 MEQ tablet Take 1 tablet (20 mEq total) by mouth 2 (two) times daily. 03/14/20  Yes Jacelyn Pi, Lilia Argue, MD  pravastatin (PRAVACHOL) 20 MG tablet Take 1 tablet (20 mg total) by mouth every evening. 03/14/20  Yes Jacelyn Pi, Lilia Argue, MD  telmisartan-hydrochlorothiazide (MICARDIS HCT) 80-25 MG tablet Take 1 tablet by mouth daily. 03/14/20  Yes Jacelyn Pi, Lilia Argue, MD   Social History   Socioeconomic History  . Marital status: Married    Spouse name: Not on file  . Number of children: 1  . Years of education: 12th grade +  . Highest education level: Not on file  Occupational History  . Occupation: Beautician  Tobacco Use  . Smoking status: Never Smoker  . Smokeless tobacco: Never Used  Vaping Use  . Vaping Use: Never used  Substance and Sexual Activity  . Alcohol use: No    Alcohol/week: 0.0 standard drinks  . Drug use: No  . Sexual activity: Not Currently    Partners: Male    Birth control/protection: Post-menopausal  Other Topics Concern  . Not on file  Social History Narrative   Lives with her husband.   Adult daughter lives nearby.   Social Determinants of Health   Financial Resource Strain:  Not on file  Food Insecurity: Not on file  Transportation Needs: Not on file  Physical Activity: Not on file  Stress: Not on file  Social Connections: Not on file  Intimate Partner Violence: Not on file    Review of Systems   Objective:   Vitals:   08/12/20 1024  BP: 130/65  Pulse: 73  Temp: 97.7 F (36.5 C)  TempSrc: Temporal  SpO2: 97%  Weight: 228 lb (103.4 kg)  Height: 5\' 2"  (1.575 m)     Physical Exam Vitals reviewed.  Constitutional:      Appearance: She is well-developed and well-nourished.  HENT:     Head: Normocephalic and atraumatic.  Eyes:     Extraocular Movements: EOM normal.     Conjunctiva/sclera: Conjunctivae normal.     Pupils: Pupils are equal, round, and reactive to light.  Neck:     Vascular: No carotid bruit.     Comments: Per neck exam no carotid bruits.  Pain-free range of motion.  Area of discomfort appears to be sternomastoid muscle with some reproducible tenderness along the muscle belly and proximal attachment. Cardiovascular:     Rate and Rhythm: Normal rate and regular rhythm.     Pulses: Intact distal pulses.     Heart sounds: Normal heart sounds.  Pulmonary:     Effort: Pulmonary effort is normal.     Breath sounds: Normal breath sounds.  Abdominal:     Palpations: Abdomen is soft. There is no pulsatile mass.     Tenderness: There is no abdominal tenderness.  Skin:    General: Skin is warm and dry.  Neurological:     General: No focal deficit present.     Mental Status: She is alert and oriented to person, place, and time.  Psychiatric:        Mood and Affect: Mood and affect normal.        Behavior: Behavior normal.       Assessment & Plan:  Angela Estrada is a 70 y.o. female . Neck pain - Plan: US Carotid Duplex Bilateral  Episodic lightheadedness - Plan: US Carotid Duplex Bilateral  Anemia, unspecified type  History of  chest pain  BRBPR (bright red blood per rectum)  Chest pain has resolved, reassuring  stress testing.  RTC precautions.  Blood pressure stable.  Previous part of blood per rectum with wiping after constipation possible fissure versus hemorrhoid, asymptomatic at present.  Fiber, fluid in diet, constipation prevention recommended.  RTC precautions if symptoms recur.  Anemia resolved on recent testing.  Can recheck levels in the next few months, sooner if any new lightheadedness or dizziness.  Rare lightheadedness at this time.  Neck pain appears to be related to the sternocleidomastoid muscle but with history of dizziness will check carotid ultrasound.  Gentle range of motion, stretches, Tylenol discussed but if those symptoms persist recommended follow-up, consider other imaging versus evaluation for carotodynia, less likely.  RTC precautions    No orders of the defined types were placed in this encounter.  Patient Instructions    Make sure to drink plenty of fluids, fiber in the diet to help prevent constipation.  You notice any further blood with wiping be seen right away - we can do an exam to see what may be causing the symptoms.  I will check an ultrasound of the carotid arteries but it appears your discomfort may be more related to the sternocleidomastoid muscle or one of the neck muscles.  Try gentle range of motion and stretches of the neck throughout the day, occasional Tylenol if needed.  If the symptoms are not improving in the next few weeks, follow-up and we can discuss other testing.  I am glad to hear the heart testing looked okay. Return to the clinic or go to the nearest emergency room if any of your symptoms worsen or new symptoms occur.   If you have lab work done today you will be contacted with your lab results within the next 2 weeks.  If you have not heard from [place] then please contact us. The fastest way to get your results is to register for My Chart.   IF you received an x-ray today, you will receive an invoice from [facility]. Please contact  [facility] at [phone] with questions or concerns regarding your invoice.   IF you received labwork today, you will receive an invoice from [place]. Please contact [facility] at [phone] with questions or concerns regarding your invoice.   Our billing staff will not be able to assist you with questions regarding bills from these companies.  You will be contacted with the lab results as soon as they are available. The fastest way to get your results is to activate your My Chart account. Instructions are located on the last page of this paperwork. If you have not heard from [place] regarding the results in 2 weeks, please contact this office.          Signed, Merri Ray, MD [facility]

## 2020-08-12 NOTE — Patient Instructions (Addendum)
  Make sure to drink plenty of fluids, fiber in the diet to help prevent constipation.  You notice any further blood with wiping be seen right away - we can do an exam to see what may be causing the symptoms.  I will check an ultrasound of the carotid arteries but it appears your discomfort may be more related to the sternocleidomastoid muscle or one of the neck muscles.  Try gentle range of motion and stretches of the neck throughout the day, occasional Tylenol if needed.  If the symptoms are not improving in the next few weeks, follow-up and we can discuss other testing.  I am glad to hear the heart testing looked okay. Return to the clinic or go to the nearest emergency room if any of your symptoms worsen or new symptoms occur.   If you have lab work done today you will be contacted with your lab results within the next 2 weeks.  If you have not heard from [place] then please contact us. The fastest way to get your results is to register for My Chart.   IF you received an x-ray today, you will receive an invoice from [facility]. Please contact [facility] at [phone] with questions or concerns regarding your invoice.   IF you received labwork today, you will receive an invoice from [place]. Please contact [facility] at [phone] with questions or concerns regarding your invoice.   Our billing staff will not be able to assist you with questions regarding bills from these companies.  You will be contacted with the lab results as soon as they are available. The fastest way to get your results is to activate your My Chart account. Instructions are located on the last page of this paperwork. If you have not heard from [place] regarding the results in 2 weeks, please contact this office.

## 2020-08-15 ENCOUNTER — Ambulatory Visit (HOSPITAL_COMMUNITY): Payer: Medicare HMO

## 2020-08-17 ENCOUNTER — Ambulatory Visit (HOSPITAL_COMMUNITY): Payer: Medicare HMO | Admitting: Physical Therapy

## 2020-08-17 ENCOUNTER — Encounter (HOSPITAL_COMMUNITY): Payer: Self-pay | Admitting: Physical Therapy

## 2020-08-17 ENCOUNTER — Other Ambulatory Visit: Payer: Self-pay

## 2020-08-17 DIAGNOSIS — R2689 Other abnormalities of gait and mobility: Secondary | ICD-10-CM | POA: Diagnosis not present

## 2020-08-17 DIAGNOSIS — M6281 Muscle weakness (generalized): Secondary | ICD-10-CM

## 2020-08-17 DIAGNOSIS — M25551 Pain in right hip: Secondary | ICD-10-CM

## 2020-08-17 NOTE — Therapy (Signed)
[facility] [address] [place], [place], [address] Phone: [phone]   Fax:  [phone]  Physical Therapy Treatment  Patient Details  Name: Angela Estrada MRN: [id number] Date of Birth: 02-26-1951 Referring Provider (PT): Frederik Pear MD  PHYSICAL THERAPY DISCHARGE SUMMARY  Visits from Start of Care: 12  Current functional level related to goals / functional outcomes: See below    Remaining deficits: See below     Education / Equipment: See assessment  Plan: Patient agrees to discharge.  Patient goals were partially met. Patient is being discharged due to being pleased with the current functional level.  ?????      Encounter Date: 08/17/2020   PT End of Session - 08/17/20 1132    Visit Number 12    Number of Visits 12    Date for PT Re-Evaluation 08/17/20    Authorization Type [employer]    Progress Note Due on Visit 10    PT Start Time 1120    PT Stop Time 1150    PT Time Calculation (min) 30 min    Equipment Utilized During Treatment --    Activity Tolerance Patient tolerated treatment well    Behavior During Therapy WFL for tasks assessed/performed           Past Medical History:  Diagnosis Date  . Abdominal hernia    RLQ  . Allergy   . Arthritis   . Arthritis   . Borderline diabetes   . GERD (gastroesophageal reflux disease)   . HYPERCHOLESTEROLEMIA 04/25/2007   takes Pravastatin daily  . HYPERTENSION 01/03/2007   takes Losartan-HCTZ  and Amlodipine daily  . Hypokalemia    takes Potassium daily  . Joint pain   . Nocturia   . Pneumonia 2 yrs ago  . Primary osteoarthritis of shoulder    Right    Past Surgical History:  Procedure Laterality Date  . CESAREAN SECTION  25 years ago  . CHOLECYSTECTOMY    . COLONOSCOPY    . DILATION AND CURETTAGE OF UTERUS  35 years ago  . HERNIA REPAIR    . INCISIONAL HERNIA REPAIR  02/21/2012   Procedure: LAPAROSCOPIC INCISIONAL HERNIA;  Surgeon: Harl Bowie, MD;  Location: [facility];  Service: General;  Laterality: N/A;  laparoscopic incisional hernia repair with mesh  . JOINT REPLACEMENT     bilateral knee replacements  . REPLACEMENT TOTAL KNEE BILATERAL  2010  . TOTAL HIP ARTHROPLASTY Right 03/19/2016   Procedure: RIGHT TOTAL HIP ARTHROPLASTY ANTERIOR APPROACH;  Surgeon: Frederik Pear, MD;  Location: [facility];  Service: Orthopedics;  Laterality: Right;  . TOTAL HIP REVISION Right 10/02/2017   Procedure: TOTAL HIP REVISION POSTERIOR;  Surgeon: Frederik Pear, MD;  Location: [facility];  Service: Orthopedics;  Laterality: Right;  . TOTAL HIP REVISION Right 05/23/2020   Procedure: RIGHT TOTAL POSTERIOR HIP REVISION;  Surgeon: Frederik Pear, MD;  Location: [facility];  Service: Orthopedics;  Laterality: Right;  . TOTAL SHOULDER ARTHROPLASTY Right 12/19/2017   Procedure: RIGHT TOTAL SHOULDER ARTHROPLASTY;  Surgeon: Tania Ade, MD;  Location: [facility];  Service: Orthopedics;  Laterality: Right;  . TUBAL LIGATION      There were no vitals filed for this visit.   Subjective Assessment - 08/17/20 1128    Subjective Patient says she is doing well. She says she is able to do what she wants to do. She says she feels ready for DC form therapy today. She feels 85% better since  starting therapy.    Limitations Sitting;Lifting;Standing;Walking;House hold activities    Currently in Pain? No/denies    Pain Onset 1 to 4 weeks ago              [facility] PT Assessment - 08/17/20 0001      Assessment   Medical Diagnosis RT THA    Referring Provider (PT) Frederik Pear MD    Onset Date/Surgical Date 05/23/20    Prior Therapy yes HH      Restrictions   Weight Bearing Restrictions No      Balance Screen   Has the patient fallen in the past 6 months No      [place] residence    Living Arrangements Spouse/significant other      Prior Function   Level of Independence Independent      Cognition   Overall Cognitive Status  Within Functional Limits for tasks assessed      Observation/Other Assessments   Focus on Therapeutic Outcomes (FOTO)  69% function      ROM / Strength   AROM / PROM / Strength Strength      AROM   Right Hip Extension 0   compensates with trunk flexion, but is the same on non operative side   Right Hip Flexion 107   was 75     Strength   Strength Assessment Site Hip;Knee;Ankle    Right/Left Hip Right;Left    Right Hip Flexion 4+/5    Left Hip Flexion 4+/5    Right/Left Knee Right;Left    Right Knee Extension 4+/5    Left Knee Extension 4+/5    Right/Left Ankle Right;Left    Right Ankle Dorsiflexion 4+/5    Left Ankle Dorsiflexion 4+/5      Transfers   Five time sit to stand comments  14.8 sec with no UE      Ambulation/Gait   Ambulation/Gait Yes    Ambulation/Gait Assistance 6: Modified independent (Device/Increase time)    Ambulation Distance (Feet) 300 Feet    Assistive device Straight cane    Gait Pattern Decreased step length - left;Decreased stance time - right;Decreased stride length;Decreased dorsiflexion - right    Ambulation Surface Level;Indoor    Gait Comments 2MWT      Static Standing Balance   Static Standing Balance -  Activities  Single Leg Stance - Right Leg;Single Leg Stance - Left Leg    Static Standing - Comment/# of Minutes 18 sec, 7 sec                                 PT Education - 08/17/20 1159    Education Details on progress to goals, walking program and exercise progression, transition to HEP for DC today    Person(s) Educated Patient    Methods Explanation    Comprehension Verbalized understanding            PT Short Term Goals - 08/03/20 1052      PT SHORT TERM GOAL #1   Title Patient will be independent with initial HEP and self-management strategies to improve functional outcomes    Baseline Reports compliance    Time 2    Period Weeks    Status Achieved    Target Date 07/01/20             PT Long  Term Goals - 08/17/20 1142  PT LONG TERM GOAL #1   Title Patient will improve FOTO score by 10% to indicate improvement in functional outcomes    Baseline Improved by 17%    Time 6    Period Weeks    Status Achieved      PT LONG TERM GOAL #2   Title Patient will report at least 75% overall improvement in subjective complaint to indicate improvement in ability to perform ADLs.    Baseline Reports 85% improvement    Time 6    Period Weeks    Status Achieved      PT LONG TERM GOAL #3   Title Patient will be able to perform stand x 5 in < 15 seconds to demonstrate improvement in functional mobility and reduced risk for falls.    Baseline Current 14.8 seconds with no UE    Time 6    Period Weeks    Status Achieved      PT LONG TERM GOAL #4   Title Patient will be able to ambulate at least 350 feet during 2MWT with LRAD to demonstrate improved ability to perform functional mobility and associated tasks.    Baseline Current 300 feet with SPC and mild gait deviations    Time 6    Period Weeks    Status Not Met                 Plan - 08/17/20 1200    Clinical Impression Statement Patient has made good progress toward therapy goals overall. Improved gait, ROM, strength and functional balance. Still slightly limited by mild gait deviation, difficulty with isolated static balance. Patient has met all therapy goals except 2MWT. At this time patient would like to continue to HEP at home, for DC from therapy today. Reviewed HEP, and discussed walking program and exercise progressions for continued strengthening. Answered all patient questions. Patient encouraged to follow up with therapy services with any further questions or concerns.    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    Stability/Clinical  Decision Making Stable/Uncomplicated    Rehab Potential Good    PT Frequency 1x / week    PT Duration 3 weeks    PT Treatment/Interventions ADLs/Self Care Home Management;Aquatic Therapy;Biofeedback;Cryotherapy;Electrical Stimulation;Gait training;Stair training;Functional mobility training;Orthotic Fit/Training;Manual techniques;Energy conservation;Splinting;Taping;Vasopneumatic Device;Therapeutic activities;Iontophoresis 56m/ml Dexamethasone;Therapeutic exercise;Moist Heat;Traction;Balance training;Manual lymph drainage;Dry needling;Joint Manipulations;Spinal Manipulations;Compression bandaging;Visual/perceptual remediation/compensation;Scar mobilization;Neuromuscular re-education;Ultrasound;Parrafin;Fluidtherapy;Contrast Bath;Patient/family education;DME Instruction;Passive range of motion    PT Next Visit Plan DC to HEP    PT Home Exercise Plan Eval: added heel slides (patient reports already doing heel raises, standing hip abduction/ extension). 1/10: sit to stand, sitting heel raise/toe raise, functional squat, semi tandem stance and forward step up. Staggered stance with UE flexion overhead 1/31: supported single leg stance    Consulted and Agree with Plan of Care Patient           Patient will benefit from skilled therapeutic intervention in order to improve the following deficits and impairments:  Abnormal gait,Decreased range of motion,Decreased balance,Decreased mobility,Difficulty walking,Pain,Decreased strength  Visit Diagnosis: Pain in right hip  Other abnormalities of gait and mobility  Muscle weakness (generalized)     Problem List Patient Active Problem List   Diagnosis Date Noted  . History of revision of total replacement of right hip joint 05/23/2020  . Prediabetes 07/21/2019  . 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 Failed total hip arthroplasty ([facility]  10/01/2017  . Arthritis of right hip 03/19/2016  . Primary osteoarthritis of right hip  03/18/2016  . Hypokalemia 03/29/2014  . Arthralgia 02/11/2014  . Routine general medical examination at a health care facility 03/28/2013  . Nonspecific abnormal findings on radiological and examination of lung field 03/27/2013  . Cough 03/11/2013  . Unspecified disorder of liver 03/09/2013  . Pyuria 03/09/2013  . Arthritis   . Ventral hernia 01/18/2012  . Encounter for long-term (current) use of other medications 07/05/2011  . HYPERGLYCEMIA 08/12/2008  . HYPERCHOLESTEROLEMIA 04/25/2007  . DIZZINESS AND GIDDINESS 04/25/2007  . Essential hypertension 01/03/2007  . ALLERGIC RHINITIS 01/03/2007   12:07 PM, 08/17/20 Josue Hector PT DPT  Physical Therapist with [facility]  [phone]   [facility] [address] [place], [place], [address] Phone: [phone]   Fax:  [phone]  Name: Angela Estrada MRN: [id number] Date of Birth: 05-20-1951

## 2020-08-18 ENCOUNTER — Ambulatory Visit
Admission: RE | Admit: 2020-08-18 | Discharge: 2020-08-18 | Disposition: A | Discharge status: Home / Self Care | Payer: Medicare HMO | Source: Ambulatory Visit | Attending: Family Medicine | Admitting: Family Medicine

## 2020-08-18 DIAGNOSIS — R42 Dizziness and giddiness: Secondary | ICD-10-CM | POA: Diagnosis not present

## 2020-08-18 DIAGNOSIS — E782 Mixed hyperlipidemia: Secondary | ICD-10-CM | POA: Diagnosis not present

## 2020-08-18 DIAGNOSIS — I10 Essential (primary) hypertension: Secondary | ICD-10-CM | POA: Diagnosis not present

## 2020-08-18 DIAGNOSIS — I6521 Occlusion and stenosis of right carotid artery: Secondary | ICD-10-CM | POA: Diagnosis not present

## 2020-08-18 DIAGNOSIS — M542 Cervicalgia: Secondary | ICD-10-CM

## 2020-08-26 ENCOUNTER — Telehealth: Payer: Self-pay | Admitting: Family Medicine

## 2020-08-26 NOTE — Telephone Encounter (Signed)
Patient is calling still waiting to hear back from [place] with results from her recent ultrasaound on knee.   Patient can be reached at [phone]

## 2020-08-29 NOTE — Telephone Encounter (Signed)
Called back she was asking about neck n knee. Advised per note Pt voiced understanding

## 2020-10-11 ENCOUNTER — Telehealth: Payer: Self-pay | Admitting: Family Medicine

## 2020-10-11 ENCOUNTER — Other Ambulatory Visit: Payer: Self-pay

## 2020-10-11 MED ORDER — TELMISARTAN-HCTZ 80-25 MG PO TABS: 1.0000 | ORAL_TABLET | Freq: Every day | ORAL | 0 refills | Status: DC

## 2020-10-11 NOTE — Telephone Encounter (Signed)
Med sent to pharmacy.

## 2020-10-11 NOTE — Telephone Encounter (Signed)
She is out of this medication.

## 2020-10-11 NOTE — Telephone Encounter (Signed)
Pt called in asking for a refill on the Telmisartan [facility]othiazide [facility] [address]

## 2020-10-31 ENCOUNTER — Ambulatory Visit (INDEPENDENT_AMBULATORY_CARE_PROVIDER_SITE_OTHER): Payer: Medicare HMO | Admitting: Family Medicine

## 2020-10-31 ENCOUNTER — Other Ambulatory Visit: Payer: Self-pay

## 2020-10-31 ENCOUNTER — Encounter: Payer: Self-pay | Admitting: Family Medicine

## 2020-10-31 VITALS — BP 132/74 | HR 55 | Temp 98.3°F | Resp 16 | Ht 62.0 in | Wt 226.8 lb

## 2020-10-31 DIAGNOSIS — I10 Essential (primary) hypertension: Secondary | ICD-10-CM

## 2020-10-31 DIAGNOSIS — M542 Cervicalgia: Secondary | ICD-10-CM | POA: Diagnosis not present

## 2020-10-31 DIAGNOSIS — K625 Hemorrhage of anus and rectum: Secondary | ICD-10-CM

## 2020-10-31 DIAGNOSIS — N3941 Urge incontinence: Secondary | ICD-10-CM

## 2020-10-31 DIAGNOSIS — E7800 Pure hypercholesterolemia, unspecified: Secondary | ICD-10-CM | POA: Diagnosis not present

## 2020-10-31 DIAGNOSIS — R7303 Prediabetes: Secondary | ICD-10-CM | POA: Diagnosis not present

## 2020-10-31 DIAGNOSIS — E785 Hyperlipidemia, unspecified: Secondary | ICD-10-CM | POA: Diagnosis not present

## 2020-10-31 DIAGNOSIS — R0789 Other chest pain: Secondary | ICD-10-CM | POA: Diagnosis not present

## 2020-10-31 MED ORDER — OXYBUTYNIN CHLORIDE 5 MG PO TABS: 5.0000 mg | ORAL_TABLET | Freq: Two times a day (BID) | ORAL | 1 refills | Status: DC

## 2020-10-31 MED ORDER — TELMISARTAN-HCTZ 80-25 MG PO TABS
1.0000 | ORAL_TABLET | Freq: Every day | ORAL | 1 refills | Status: DC
Start: 2020-10-31 — End: 2021-05-23

## 2020-10-31 MED ORDER — PRAVASTATIN SODIUM 20 MG PO TABS
20.0000 mg | ORAL_TABLET | Freq: Every evening | ORAL | 3 refills | Status: DC
Start: 2020-10-31 — End: 2022-01-22

## 2020-10-31 MED ORDER — AMLODIPINE BESYLATE 2.5 MG PO TABS: 2.5000 mg | ORAL_TABLET | Freq: Every day | ORAL | 1 refills | Status: DC

## 2020-10-31 NOTE — Progress Notes (Signed)
Subjective:  Patient ID: Angela Estrada, female    DOB: 03-11-1951  Age: 70 y.o. MRN: [id number]  CC:  Chief Complaint  Patient presents with  . Hypertension    Pt denies physical sxs, doing well needs refill   . Hyperlipidemia    Pt in need of refill pravastatin today doing well.     HPI Angela Estrada presents for   Follow up - see 08/12/20.  No further BRBPR. Constipation is better.  Neck pain from last vist much better - nearly resolved.  Lab Results  Component Value Date   WBC 7.6 08/03/2020   HGB 14.2 08/03/2020   HCT 42.2 08/03/2020   MCV 90 08/03/2020   PLT 279 08/03/2020     Hypertension: Had appt with cardiology today - planning on fasting labs. No med changes. Still on toprol, potassium, micardis HCT, norvasc Home readings: same as here. 130/70 range.  BP Readings from Last 3 Encounters:  10/31/20 132/74  08/12/20 130/65  08/08/20 132/81   Lab Results  Component Value Date   CREATININE 0.78 07/27/2020    Overactive bladder: Ditropan BID - has  Been helping with urges and no recent incontinence. No lightheadedness, dizziness or new side effects.   Hyperlipidemia: Orders pending for labs with cardiology.  Pravastatin 20mg  qd.  Lab Results  Component Value Date   CHOL 176 03/14/2020   HDL 59 03/14/2020   LDLCALC 95 03/14/2020   TRIG 125 03/14/2020   CHOLHDL 3.0 03/14/2020   Lab Results  Component Value Date   ALT 16 05/02/2020   AST 20 05/02/2020   ALKPHOS 78 05/02/2020   BILITOT 0.7 05/02/2020     History Patient Active Problem List   Diagnosis Date Noted  . History of revision of total replacement of right hip joint 05/23/2020  . Prediabetes 07/21/2019  . 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 Failed total hip arthroplasty ([facility]) 10/01/2017  . Arthritis of right hip 03/19/2016  . Primary osteoarthritis of right hip 03/18/2016  . Hypokalemia 03/29/2014  . Arthralgia 02/11/2014  . Routine general  medical examination at a health care facility 03/28/2013  . Nonspecific abnormal findings on radiological and examination of lung field 03/27/2013  . Cough 03/11/2013  . Unspecified disorder of liver 03/09/2013  . Pyuria 03/09/2013  . Arthritis   . Ventral hernia 01/18/2012  . Encounter for long-term (current) use of other medications 07/05/2011  . HYPERGLYCEMIA 08/12/2008  . HYPERCHOLESTEROLEMIA 04/25/2007  . DIZZINESS AND GIDDINESS 04/25/2007  . Essential hypertension 01/03/2007  . ALLERGIC RHINITIS 01/03/2007   Past Medical History:  Diagnosis Date  . Abdominal hernia    RLQ  . Allergy   . Arthritis   . Arthritis   . Borderline diabetes   . GERD (gastroesophageal reflux disease)   . HYPERCHOLESTEROLEMIA 04/25/2007   takes Pravastatin daily  . HYPERTENSION 01/03/2007   takes Losartan-HCTZ  and Amlodipine daily  . Hypokalemia    takes Potassium daily  . Joint pain   . Nocturia   . Pneumonia 2 yrs ago  . Primary osteoarthritis of shoulder    Right   Past Surgical History:  Procedure Laterality Date  . CESAREAN SECTION  25 years ago  . CHOLECYSTECTOMY    . COLONOSCOPY    . DILATION AND CURETTAGE OF UTERUS  35 years ago  . HERNIA REPAIR    . INCISIONAL HERNIA REPAIR  02/21/2012   Procedure: LAPAROSCOPIC INCISIONAL HERNIA;  Surgeon: Harl Bowie, MD;  [facility];  Service: General;  Laterality: N/A;  laparoscopic incisional hernia repair with mesh  . JOINT REPLACEMENT     bilateral knee replacements  . REPLACEMENT TOTAL KNEE BILATERAL  2010  . TOTAL HIP ARTHROPLASTY Right 03/19/2016   Procedure: RIGHT TOTAL HIP ARTHROPLASTY ANTERIOR APPROACH;  Surgeon: Frederik Pear, MD;  Location: [facility];  Service: Orthopedics;  Laterality: Right;  . TOTAL HIP REVISION Right 10/02/2017   Procedure: TOTAL HIP REVISION POSTERIOR;  Surgeon: Frederik Pear, MD;  Location: [facility];  Service: Orthopedics;  Laterality: Right;  . TOTAL HIP REVISION Right 05/23/2020   Procedure: RIGHT TOTAL  POSTERIOR HIP REVISION;  Surgeon: Frederik Pear, MD;  [facility];  Service: Orthopedics;  Laterality: Right;  . TOTAL SHOULDER ARTHROPLASTY Right 12/19/2017   Procedure: RIGHT TOTAL SHOULDER ARTHROPLASTY;  Surgeon: Tania Ade, MD;  Location[facility];  Service: Orthopedics;  Laterality: Right;  . TUBAL LIGATION     Allergies  Allergen Reactions  . Lisinopril Cough  . Oxycodone Other (See Comments)    Hallucinations   Prior to Admission medications   Medication Sig Start Date End Date Taking? Authorizing Provider  amLODipine (NORVASC) 2.5 MG tablet Take 1 tablet (2.5 mg total) by mouth daily. 03/14/20  Yes Jacelyn Pi, Irma M, MD  Apoaequorin (PREVAGEN) 10 MG CAPS Take 10 mg by mouth daily.   Yes [provider]  aspirin EC 81 MG tablet Take 1 tablet (81 mg total) by mouth 2 (two) times daily. 05/23/20  Yes Leighton Parody, PA-C  Carboxymethylcellul-Glycerin (LUBRICATING EYE DROPS OP) Place 1 drop into both eyes daily as needed (dry eyes).   Yes [provider]  cetirizine-pseudoephedrine (ZYRTEC-D) 5-120 MG tablet Take 1 tablet by mouth daily as needed for allergies.   Yes [provider]  famotidine (PEPCID) 20 MG tablet TAKE 1 TABLET BY MOUTH TWICE A DAY 06/28/20  Yes Maximiano Coss, NP  metoprolol succinate (TOPROL-XL) 25 MG 24 hr tablet Take 25 mg by mouth daily. 08/03/20  Yes [provider]  Multiple Vitamin (MULTIVITAMIN WITH MINERALS) TABS tablet Take 1 tablet by mouth daily.   Yes [provider]  oxybutynin (DITROPAN) 5 MG tablet Take 1 tablet (5 mg total) by mouth 2 (two) times daily. 03/14/20  Yes Jacelyn Pi, Lilia Argue, MD  potassium chloride SA (KLOR-CON M20) 20 MEQ tablet Take 1 tablet (20 mEq total) by mouth 2 (two) times daily. 03/14/20  Yes Jacelyn Pi, Lilia Argue, MD  pravastatin (PRAVACHOL) 20 MG tablet Take 1 tablet (20 mg total) by mouth every evening. 03/14/20  Yes Jacelyn Pi, Lilia Argue, MD  telmisartan-hydrochlorothiazide  (MICARDIS HCT) 80-25 MG tablet Take 1 tablet by mouth daily. 10/11/20  Yes Wendie Agreste, MD   Social History   Socioeconomic History  . Marital status: Married    Spouse name: Not on file  . Number of children: 1  . Years of education: 12th grade +  . Highest education level: Not on file  Occupational History  . Occupation: Beautician  Tobacco Use  . Smoking status: Never Smoker  . Smokeless tobacco: Never Used  Vaping Use  . Vaping Use: Never used  Substance and Sexual Activity  . Alcohol use: No    Alcohol/week: 0.0 standard drinks  . Drug use: No  . Sexual activity: Not Currently    Partners: Male    Birth control/protection: Post-menopausal  Other Topics Concern  . Not on file  Social History Narrative   Lives with  her husband.   Adult daughter lives nearby.   Social Determinants of Health   Financial Resource Strain: Not on file  Food Insecurity: Not on file  Transportation Needs: Not on file  Physical Activity: Not on file  Stress: Not on file  Social Connections: Not on file  Intimate Partner Violence: Not on file    Review of Systems  Constitutional: Negative for fatigue and unexpected weight change.  Respiratory: Negative for chest tightness and shortness of breath.   Cardiovascular: Negative for chest pain, palpitations and leg swelling.  Gastrointestinal: Negative for abdominal pain and blood in stool.  Neurological: Negative for dizziness, syncope, light-headedness and headaches.     Objective:   Vitals:   10/31/20 1530  BP: 132/74  Pulse: (!) 55  Resp: 16  Temp: 98.3 F (36.8 C)  TempSrc: Temporal  SpO2: 97%  Weight: 226 lb 12.8 oz (102.9 kg)  Height: 5\' 2"  (1.575 m)     Physical Exam Vitals reviewed.  Constitutional:      Appearance: She is well-developed.  HENT:     Head: Normocephalic and atraumatic.  Eyes:     Conjunctiva/sclera: Conjunctivae normal.     Pupils: Pupils are equal, round, and reactive to light.  Neck:      Vascular: No carotid bruit.  Cardiovascular:     Rate and Rhythm: Normal rate and regular rhythm.     Heart sounds: Normal heart sounds.  Pulmonary:     Effort: Pulmonary effort is normal.     Breath sounds: Normal breath sounds.  Abdominal:     Palpations: Abdomen is soft. There is no pulsatile mass.     Tenderness: There is no abdominal tenderness.  Musculoskeletal:     Comments: Sternocleidomastoid muscle nontender, carotid nontender bilaterally  Skin:    General: Skin is warm and dry.  Neurological:     Mental Status: She is alert and oriented to person, place, and time.  Psychiatric:        Behavior: Behavior normal.      Assessment & Plan:  Angela Estrada is a 70 y.o. female . Neck pain  -improved, likely SCM muscle prior. rtc precautions if recurs.   BRBPR (bright red blood per rectum)  - resolved. Rtc precautions.   Essential hypertension - Plan: amLODipine (NORVASC) 2.5 MG tablet, telmisartan-hydrochlorothiazide (MICARDIS HCT) 80-25 MG tablet  -  Stable, tolerating current regimen. Medications refilled. Labs planned with cardiology.   Urge incontinence - Plan: oxybutynin (DITROPAN) 5 MG tablet  - controlled with oxybutynin - continue same.   Pure hypercholesterolemia - Plan: pravastatin (PRAVACHOL) 20 MG tablet  -  Stable, tolerating current regimen. Medications refilled.    Meds ordered this encounter  Medications  . amLODipine (NORVASC) 2.5 MG tablet    Sig: Take 1 tablet (2.5 mg total) by mouth daily.    Dispense:  90 tablet    Refill:  1  . oxybutynin (DITROPAN) 5 MG tablet    Sig: Take 1 tablet (5 mg total) by mouth 2 (two) times daily.    Dispense:  180 tablet    Refill:  1  . pravastatin (PRAVACHOL) 20 MG tablet    Sig: Take 1 tablet (20 mg total) by mouth every evening.    Dispense:  90 tablet    Refill:  3  . telmisartan-hydrochlorothiazide (MICARDIS HCT) 80-25 MG tablet    Sig: Take 1 tablet by mouth daily.    Dispense:  90 tablet     Refill:  1   There are no Patient Instructions on file for this visit.    Signed, Merri Ray, MD [facility]

## 2020-11-01 ENCOUNTER — Other Ambulatory Visit (HOSPITAL_COMMUNITY)
Admission: RE | Admit: 2020-11-01 | Discharge: 2020-11-01 | Disposition: A | Discharge status: Home / Self Care | Payer: Medicare HMO | Source: Ambulatory Visit | Attending: Cardiology | Admitting: Cardiology

## 2020-11-01 DIAGNOSIS — E785 Hyperlipidemia, unspecified: Secondary | ICD-10-CM | POA: Diagnosis not present

## 2020-11-01 DIAGNOSIS — I10 Essential (primary) hypertension: Secondary | ICD-10-CM | POA: Insufficient documentation

## 2020-11-01 LAB — LIPID PANEL
Cholesterol: 181 mg/dL (ref 0–200)
HDL: 63 mg/dL (ref >40)
LDL Cholesterol: 102 mg/dL — ABNORMAL HIGH (ref 0–99)
Total CHOL/HDL Ratio: 2.9 RATIO
Triglycerides: 82 mg/dL (ref <150)
VLDL: 16 mg/dL (ref 0–40)

## 2020-11-01 LAB — HEPATIC FUNCTION PANEL
ALT: 21 U/L (ref 0–44)
AST: 24 U/L (ref 15–41)
Albumin: 3.9 g/dL (ref 3.5–5.0)
Alkaline Phosphatase: 55 U/L (ref 38–126)
Bilirubin, Direct: 0.1 mg/dL (ref 0.0–0.2)
Indirect Bilirubin: 0.9 mg/dL (ref 0.3–0.9)
Total Bilirubin: 1 mg/dL (ref 0.3–1.2)
Total Protein: 7.4 g/dL (ref 6.5–8.1)

## 2020-11-01 LAB — BASIC METABOLIC PANEL
Anion gap: 6 (ref 5–15)
BUN: 12 mg/dL (ref 8–23)
CO2: 29 mmol/L (ref 22–32)
Calcium: 9.2 mg/dL (ref 8.9–10.3)
Chloride: 102 mmol/L (ref 98–111)
Creatinine, Ser: 0.71 mg/dL (ref 0.44–1.00)
GFR, Estimated: >60 mL/min (ref >60)
Glucose, Bld: 105 mg/dL — ABNORMAL HIGH (ref 70–99)
Potassium: 3.5 mmol/L (ref 3.5–5.1)
Sodium: 137 mmol/L (ref 135–145)

## 2020-11-18 ENCOUNTER — Other Ambulatory Visit: Payer: Self-pay

## 2020-11-18 ENCOUNTER — Telehealth: Payer: Self-pay | Admitting: Family Medicine

## 2020-11-18 MED ORDER — POTASSIUM CHLORIDE CRYS ER 20 MEQ PO TBCR: 20.0000 meq | EXTENDED_RELEASE_TABLET | Freq: Two times a day (BID) | ORAL | 1 refills | Status: DC

## 2020-11-18 NOTE — Telephone Encounter (Signed)
Medication sent to pharmacy  

## 2020-11-18 NOTE — Telephone Encounter (Signed)
..  Medication Refills  Last OV:  Medication:  Klor-conm20 tablets  [facility] [place] [address]  Let patient know to contact pharmacy at the end of the day to make sure medication is ready.   Please notify patient to allow 48-72 hours to process.  Encourage patient to contact the pharmacy for refills or they can request refills through [facility] out below:   Last refill:  QTY:  Refill Date:    Other Comments:   Okay for refill?  Please advise.

## 2020-12-06 ENCOUNTER — Ambulatory Visit: Payer: Medicare HMO | Admitting: Obstetrics and Gynecology

## 2020-12-06 DIAGNOSIS — Z0289 Encounter for other administrative examinations: Secondary | ICD-10-CM

## 2020-12-06 NOTE — Progress Notes (Deleted)
70 y.o. G38P1001 Married [place] American female here for annual exam.    PCP:     Patient's last menstrual period was 08/30/1998 (approximate).           Sexually active: {yes no:314532}  The current method of family planning is tubal ligation/postmenopausal.    Exercising: {yes no:314532}  {types:19826} Smoker:  no  Health Maintenance: Pap:  11-24-18 Neg, 06-08-16 Neg:Neg HR HPV History of abnormal Pap:  yes, 1990 had abnormal pap but normal on repeat--no colposcopy.  Paps normal since. MMG: 01-15-20 3D/Neg/BiRads1 Colonoscopy:  03/20/17 Normal repeat 10 years BMD:  02-02-19  Result :Normal TDaP:  10-02-13 Gardasil:   no HIV: 05-17-16 NR Hep C: 03-10-16 Neg Screening Labs:  Hb today: ***, Urine today: ***   reports that she has never smoked. She has never used smokeless tobacco. She reports that she does not drink alcohol and does not use drugs.  Past Medical History:  Diagnosis Date   Abdominal hernia    RLQ   Allergy    Arthritis    Arthritis    Borderline diabetes    GERD (gastroesophageal reflux disease)    HYPERCHOLESTEROLEMIA 04/25/2007   takes Pravastatin daily   HYPERTENSION 01/03/2007   takes Losartan-HCTZ  and Amlodipine daily   Hypokalemia    takes Potassium daily   Joint pain    Nocturia    Pneumonia 2 yrs ago   Primary osteoarthritis of shoulder    Right    Past Surgical History:  Procedure Laterality Date   CESAREAN SECTION  25 years ago   [place]  35 years ago   [place]  02/21/2012   Procedure: LAPAROSCOPIC INCISIONAL HERNIA;  Surgeon: Harl Bowie, MD;  Location: [place];  Service: General;  Laterality: N/A;  laparoscopic incisional hernia repair with mesh   JOINT REPLACEMENT     bilateral knee replacements   REPLACEMENT TOTAL KNEE BILATERAL  2010   TOTAL HIP ARTHROPLASTY Right 03/19/2016   Procedure: RIGHT TOTAL HIP ARTHROPLASTY ANTERIOR APPROACH;   Surgeon: Frederik Pear, MD;  Location: [facility];  Service: Orthopedics;  Laterality: Right;   TOTAL HIP REVISION Right 10/02/2017   Procedure: TOTAL HIP REVISION POSTERIOR;  Surgeon: Frederik Pear, MD;  Location: [facility];  Service: Orthopedics;  Laterality: Right;   TOTAL HIP REVISION Right 05/23/2020   Procedure: RIGHT TOTAL POSTERIOR HIP REVISION;  Surgeon: Frederik Pear, MD;  Location: [facility];  Service: Orthopedics;  Laterality: Right;   TOTAL SHOULDER ARTHROPLASTY Right 12/19/2017   Procedure: RIGHT TOTAL SHOULDER ARTHROPLASTY;  Surgeon: Tania Ade, MD;  Location: [facility];  Service: Orthopedics;  Laterality: Right;   TUBAL LIGATION      Current Outpatient Medications  Medication Sig Dispense Refill   amLODipine (NORVASC) 2.5 MG tablet Take 1 tablet (2.5 mg total) by mouth daily. 90 tablet 1   Apoaequorin (PREVAGEN) 10 MG CAPS Take 10 mg by mouth daily.     aspirin EC 81 MG tablet Take 1 tablet (81 mg total) by mouth 2 (two) times daily. 60 tablet 0   Carboxymethylcellul-Glycerin (LUBRICATING EYE DROPS OP) Place 1 drop into both eyes daily as needed (dry eyes).     cetirizine-pseudoephedrine (ZYRTEC-D) 5-120 MG tablet Take 1 tablet by mouth daily as needed for allergies.     famotidine (PEPCID) 20 MG tablet TAKE 1 TABLET BY MOUTH TWICE A DAY 180 tablet  1   metoprolol succinate (TOPROL-XL) 25 MG 24 hr tablet Take 25 mg by mouth daily.     Multiple Vitamin (MULTIVITAMIN WITH MINERALS) TABS tablet Take 1 tablet by mouth daily.     oxybutynin (DITROPAN) 5 MG tablet Take 1 tablet (5 mg total) by mouth 2 (two) times daily. 180 tablet 1   potassium chloride SA (KLOR-CON M20) 20 MEQ tablet Take 1 tablet (20 mEq total) by mouth 2 (two) times daily. 180 tablet 1   pravastatin (PRAVACHOL) 20 MG tablet Take 1 tablet (20 mg total) by mouth every evening. 90 tablet 3   telmisartan-hydrochlorothiazide (MICARDIS HCT) 80-25 MG tablet Take 1 tablet by mouth daily. 90 tablet 1   No current facility-administered  medications for this visit.    Family History  Problem Relation Age of Onset   Diabetes Mother    Heart disease Mother    Hypertension Mother    Stroke Mother    Diabetes Father    Heart disease Father        Triple Bypass   Hypertension Father    Heart attack Brother    Congestive Heart Failure Brother    Hypertension Brother    Heart disease Brother    Hypertension Sister    Hypertension Sister    Cancer Sister        uterine   Hypertension Sister    Hypertension Sister    Hypertension Sister    Hypertension Sister    Cancer Sister        uterine   Colon cancer Neg Hx     Review of Systems  Exam:   LMP 08/30/1998 (Approximate)     General appearance: alert, cooperative and appears stated age Head: normocephalic, without obvious abnormality, atraumatic Neck: no adenopathy, supple, symmetrical, trachea midline and thyroid normal to inspection and palpation Lungs: clear to auscultation bilaterally Breasts: normal appearance, no masses or tenderness, No nipple retraction or dimpling, No nipple discharge or bleeding, No axillary adenopathy Heart: regular rate and rhythm Abdomen: soft, non-tender; no masses, no organomegaly Extremities: extremities normal, atraumatic, no cyanosis or edema Skin: skin color, texture, turgor normal. No rashes or lesions Lymph nodes: cervical, supraclavicular, and axillary nodes normal. Neurologic: grossly normal  Pelvic: External genitalia:  no lesions              No abnormal inguinal nodes palpated.              Urethra:  normal appearing urethra with no masses, tenderness or lesions              Bartholins and Skenes: normal                 Vagina: normal appearing vagina with normal color and discharge, no lesions              Cervix: no lesions              Pap taken: {yes no:314532} Bimanual Exam:  Uterus:  normal size, contour, position, consistency, mobility, non-tender              Adnexa: no mass, fullness, tenderness               Rectal exam: {yes no:314532}.  Confirms.              Anus:  normal sphincter tone, no lesions  Chaperone was present for exam.  Assessment:   Well woman visit with normal exam.   Plan: Mammogram screening discussed.  Self breast awareness reviewed. Pap and HR HPV as above. Guidelines for Calcium, Vitamin D, regular exercise program including cardiovascular and weight bearing exercise.   Follow up annually and prn.   Additional counseling given.  {yes [id number]. _______ minutes face to face time of which over 50% was spent in counseling.    After visit summary provided.

## 2021-01-01 ENCOUNTER — Other Ambulatory Visit: Payer: Self-pay | Admitting: Registered Nurse

## 2021-01-03 ENCOUNTER — Other Ambulatory Visit: Payer: Self-pay | Admitting: Obstetrics and Gynecology

## 2021-01-03 DIAGNOSIS — Z1231 Encounter for screening mammogram for malignant neoplasm of breast: Secondary | ICD-10-CM

## 2021-01-17 ENCOUNTER — Ambulatory Visit
Admission: RE | Admit: 2021-01-17 | Discharge: 2021-01-17 | Disposition: A | Discharge status: Home / Self Care | Payer: Medicare HMO | Source: Ambulatory Visit | Attending: Obstetrics and Gynecology | Admitting: Obstetrics and Gynecology

## 2021-01-17 ENCOUNTER — Other Ambulatory Visit: Payer: Self-pay

## 2021-01-17 DIAGNOSIS — Z1231 Encounter for screening mammogram for malignant neoplasm of breast: Secondary | ICD-10-CM | POA: Diagnosis not present

## 2021-01-30 DIAGNOSIS — E785 Hyperlipidemia, unspecified: Secondary | ICD-10-CM | POA: Diagnosis not present

## 2021-01-30 DIAGNOSIS — I10 Essential (primary) hypertension: Secondary | ICD-10-CM | POA: Diagnosis not present

## 2021-01-30 DIAGNOSIS — R7303 Prediabetes: Secondary | ICD-10-CM | POA: Diagnosis not present

## 2021-01-30 DIAGNOSIS — M1990 Unspecified osteoarthritis, unspecified site: Secondary | ICD-10-CM | POA: Diagnosis not present

## 2021-02-19 ENCOUNTER — Telehealth: Payer: Self-pay

## 2021-02-19 NOTE — Telephone Encounter (Signed)
Left message on voicemail req call back to schedule appt with health advisor I am available Fri 9/16 2-5, 9/17 9-4, 9/18 10-3, 9/23 2-5

## 2021-03-13 DIAGNOSIS — H5203 Hypermetropia, bilateral: Secondary | ICD-10-CM | POA: Diagnosis not present

## 2021-03-13 DIAGNOSIS — H524 Presbyopia: Secondary | ICD-10-CM | POA: Diagnosis not present

## 2021-03-13 DIAGNOSIS — H52202 Unspecified astigmatism, left eye: Secondary | ICD-10-CM | POA: Diagnosis not present

## 2021-03-13 DIAGNOSIS — H2513 Age-related nuclear cataract, bilateral: Secondary | ICD-10-CM | POA: Diagnosis not present

## 2021-03-27 ENCOUNTER — Ambulatory Visit (INDEPENDENT_AMBULATORY_CARE_PROVIDER_SITE_OTHER): Payer: Medicare HMO | Admitting: *Deleted

## 2021-03-27 DIAGNOSIS — Z0000 Encounter for general adult medical examination without abnormal findings: Secondary | ICD-10-CM

## 2021-03-27 NOTE — Patient Instructions (Signed)
Angela Estrada , Thank you for taking time to come for your Medicare Wellness Visit. I appreciate your ongoing commitment to your health goals. Please review the following plan we discussed and let me know if I can assist you in the future.   Screening recommendations/referrals: Colonoscopy: up to date Mammogram: up to date Bone Density: Education provided Recommended yearly ophthalmology/optometry visit for glaucoma screening and checkup Recommended yearly dental visit for hygiene and checkup  Vaccinations: Influenza vaccine: Education provided Pneumococcal vaccine: up to date Tdap vaccine: up to date Shingles vaccine: up to date    Advanced directives: Education provided  Conditions/risks identified:   Next appointment: 05-03-2021 @ 10:40  Dr. Carlota Raspberry   Preventive Care 68 Years and Older, Female Preventive care refers to lifestyle choices and visits with your health care provider that can promote health and wellness. What does preventive care include? A yearly physical exam. This is also called an annual well check. Dental exams once or twice a year. Routine eye exams. Ask your health care provider how often you should have your eyes checked. Personal lifestyle choices, including: Daily care of your teeth and gums. Regular physical activity. Eating a healthy diet. Avoiding tobacco and drug use. Limiting alcohol use. Practicing safe sex. Taking low-dose aspirin every day. Taking vitamin and mineral supplements as recommended by your health care provider. What happens during an annual well check? The services and screenings done by your health care provider during your annual well check will depend on your age, overall health, lifestyle risk factors, and family history of disease. Counseling  Your health care provider may ask you questions about your: Alcohol use. Tobacco use. Drug use. Emotional well-being. Home and relationship well-being. Sexual activity. Eating  habits. History of falls. Memory and ability to understand (cognition). Work and work Statistician. Reproductive health. Screening  You may have the following tests or measurements: Height, weight, and BMI. Blood pressure. Lipid and cholesterol levels. These may be checked every 5 years, or more frequently if you are over 16 years old. Skin check. Lung cancer screening. You may have this screening every year starting at age 52 if you have a 30-pack-year history of smoking and currently smoke or have quit within the past 15 years. Fecal occult blood test (FOBT) of the stool. You may have this test every year starting at age 51. Flexible sigmoidoscopy or colonoscopy. You may have a sigmoidoscopy every 5 years or a colonoscopy every 10 years starting at age 36. Hepatitis C blood test. Hepatitis B blood test. Sexually transmitted disease (STD) testing. Diabetes screening. This is done by checking your blood sugar (glucose) after you have not eaten for a while (fasting). You may have this done every 1-3 years. Bone density scan. This is done to screen for osteoporosis. You may have this done starting at age 43. Mammogram. This may be done every 1-2 years. Talk to your health care provider about how often you should have regular mammograms. Talk with your health care provider about your test results, treatment options, and if necessary, the need for more tests. Vaccines  Your health care provider may recommend certain vaccines, such as: Influenza vaccine. This is recommended every year. Tetanus, diphtheria, and acellular pertussis (Tdap, Td) vaccine. You may need a Td booster every 10 years. Zoster vaccine. You may need this after age 8. Pneumococcal 13-valent conjugate (PCV13) vaccine. One dose is recommended after age 71. Pneumococcal polysaccharide (PPSV23) vaccine. One dose is recommended after age 42. Talk to your health care  provider about which screenings and vaccines you need and how  often you need them. This information is not intended to replace advice given to you by your health care provider. Make sure you discuss any questions you have with your health care provider. Document Released: 06/24/2015 Document Revised: 02/15/2016 Document Reviewed: 03/29/2015 Elsevier Interactive Patient Education  2017 [employer] Prevention in the Home Falls can cause injuries. They can happen to people of all ages. There are many things you can do to make your home safe and to help prevent falls. What can I do on the outside of my home? Regularly fix the edges of walkways and driveways and fix any cracks. Remove anything that might make you trip as you walk through a door, such as a raised step or threshold. Trim any bushes or trees on the path to your home. Use bright outdoor lighting. Clear any walking paths of anything that might make someone trip, such as rocks or tools. Regularly check to see if handrails are loose or broken. Make sure that both sides of any steps have handrails. Any raised decks and porches should have guardrails on the edges. Have any leaves, snow, or ice cleared regularly. Use sand or salt on walking paths during winter. Clean up any spills in your garage right away. This includes oil or grease spills. What can I do in the bathroom? Use night lights. Install grab bars by the toilet and in the tub and shower. Do not use towel bars as grab bars. Use non-skid mats or decals in the tub or shower. If you need to sit down in the shower, use a plastic, non-slip stool. Keep the floor dry. Clean up any water that spills on the floor as soon as it happens. Remove soap buildup in the tub or shower regularly. Attach bath mats securely with double-sided non-slip rug tape. Do not have throw rugs and other things on the floor that can make you trip. What can I do in the bedroom? Use night lights. Make sure that you have a light by your bed that is easy to  reach. Do not use any sheets or blankets that are too big for your bed. They should not hang down onto the floor. Have a firm chair that has side arms. You can use this for support while you get dressed. Do not have throw rugs and other things on the floor that can make you trip. What can I do in the kitchen? Clean up any spills right away. Avoid walking on wet floors. Keep items that you use a lot in easy-to-reach places. If you need to reach something above you, use a strong step stool that has a grab bar. Keep electrical cords out of the way. Do not use floor polish or wax that makes floors slippery. If you must use wax, use non-skid floor wax. Do not have throw rugs and other things on the floor that can make you trip. What can I do with my stairs? Do not leave any items on the stairs. Make sure that there are handrails on both sides of the stairs and use them. Fix handrails that are broken or loose. Make sure that handrails are as long as the stairways. Check any carpeting to make sure that it is firmly attached to the stairs. Fix any carpet that is loose or worn. Avoid having throw rugs at the top or bottom of the stairs. If you do have throw rugs, attach them to the  floor with carpet tape. Make sure that you have a light switch at the top of the stairs and the bottom of the stairs. If you do not have them, ask someone to add them for you. What else can I do to help prevent falls? Wear shoes that: Do not have high heels. Have rubber bottoms. Are comfortable and fit you well. Are closed at the toe. Do not wear sandals. If you use a stepladder: Make sure that it is fully opened. Do not climb a closed stepladder. Make sure that both sides of the stepladder are locked into place. Ask someone to hold it for you, if possible. Clearly mark and make sure that you can see: Any grab bars or handrails. First and last steps. Where the edge of each step is. Use tools that help you move  around (mobility aids) if they are needed. These include: Canes. Walkers. Scooters. Crutches. Turn on the lights when you go into a dark area. Replace any light bulbs as soon as they burn out. Set up your furniture so you have a clear path. Avoid moving your furniture around. If any of your floors are uneven, fix them. If there are any pets around you, be aware of where they are. Review your medicines with your doctor. Some medicines can make you feel dizzy. This can increase your chance of falling. Ask your doctor what other things that you can do to help prevent falls. This information is not intended to replace advice given to you by your health care provider. Make sure you discuss any questions you have with your health care provider. Document Released: 03/24/2009 Document Revised: 11/03/2015 Document Reviewed: 07/02/2014 Elsevier Interactive Patient Education  2017 [employer].

## 2021-03-27 NOTE — Progress Notes (Signed)
Subjective:   Angela Estrada is a 70 y.o. female who presents for Medicare Annual (Subsequent) preventive examination.I connected with  [name] on 03/27/21 by a telephone enabled telemedicine application and verified that I am speaking with the correct person using two identifiers.   I discussed the limitations of evaluation and management by telemedicine. The patient expressed understanding and agreed to proceed.   Review of Systems     Cardiac Risk Factors include: advanced age (>91men, >77 women);hypertension;obesity (BMI >30kg/m2)     Objective:    Today's Vitals   There is no height or weight on file to calculate BMI.  Advanced Directives 03/27/2021 07/27/2020 06/15/2020 05/23/2020 05/19/2020 02/10/2020 02/03/2019  Does Patient Have a Medical Advance Directive? No No Yes Yes Yes No No  Type of Advance Directive - Scientist, water quality of [place];Living will [place];Living will - -  Does patient want to make changes to medical advance directive? - - No - Patient declined No - Patient declined - - -  Copy of [place] in Chart? - - No - copy requested No - copy requested - - -  Would patient like information on creating a medical advance directive? No - Patient declined No - Patient declined - - - No - Patient declined No - Patient declined  Pre-existing out of facility DNR order (yellow form or pink MOST form) - - - - - - -    Current Medications (verified) Outpatient Encounter Medications as of 03/27/2021  Medication Sig   amLODipine (NORVASC) 2.5 MG tablet Take 1 tablet (2.5 mg total) by mouth daily.   Apoaequorin (PREVAGEN) 10 MG CAPS Take 10 mg by mouth daily.   aspirin EC 81 MG tablet Take 1 tablet (81 mg total) by mouth 2 (two) times daily.   Carboxymethylcellul-Glycerin (LUBRICATING EYE DROPS OP) Place 1 drop into both eyes daily as needed (dry eyes).   cetirizine-pseudoephedrine (ZYRTEC-D)  5-120 MG tablet Take 1 tablet by mouth daily as needed for allergies.   famotidine (PEPCID) 20 MG tablet TAKE 1 TABLET BY MOUTH TWICE A DAY   metoprolol succinate (TOPROL-XL) 25 MG 24 hr tablet Take 25 mg by mouth daily.   Multiple Vitamin (MULTIVITAMIN WITH MINERALS) TABS tablet Take 1 tablet by mouth daily.   oxybutynin (DITROPAN) 5 MG tablet Take 1 tablet (5 mg total) by mouth 2 (two) times daily.   potassium chloride SA (KLOR-CON M20) 20 MEQ tablet Take 1 tablet (20 mEq total) by mouth 2 (two) times daily.   pravastatin (PRAVACHOL) 20 MG tablet Take 1 tablet (20 mg total) by mouth every evening.   telmisartan-hydrochlorothiazide (MICARDIS HCT) 80-25 MG tablet Take 1 tablet by mouth daily.   No facility-administered encounter medications on file as of 03/27/2021.    Allergies (verified) Lisinopril and Oxycodone   History: Past Medical History:  Diagnosis Date   Abdominal hernia    RLQ   Allergy    Arthritis    Arthritis    Borderline diabetes    GERD (gastroesophageal reflux disease)    HYPERCHOLESTEROLEMIA 04/25/2007   takes Pravastatin daily   HYPERTENSION 01/03/2007   takes Losartan-HCTZ  and Amlodipine daily   Hypokalemia    takes Potassium daily   Joint pain    Nocturia    Pneumonia 2 yrs ago   Primary osteoarthritis of shoulder    Right   Past Surgical History:  Procedure Laterality Date   CESAREAN SECTION  25 years ago   [place]  35 years ago   [place]  02/21/2012   Procedure: LAPAROSCOPIC INCISIONAL HERNIA;  Surgeon: Harl Bowie, MD;  Location: [place];  Service: General;  Laterality: N/A;  laparoscopic incisional hernia repair with mesh   JOINT REPLACEMENT     bilateral knee replacements   REPLACEMENT TOTAL KNEE BILATERAL  2010   TOTAL HIP ARTHROPLASTY Right 03/19/2016   Procedure: RIGHT TOTAL HIP ARTHROPLASTY ANTERIOR APPROACH;  Surgeon: Frederik Pear, MD;   Location: [place];  Service: Orthopedics;  Laterality: Right;   TOTAL HIP REVISION Right 10/02/2017   Procedure: TOTAL HIP REVISION POSTERIOR;  Surgeon: Frederik Pear, MD;  Location: [place];  Service: Orthopedics;  Laterality: Right;   TOTAL HIP REVISION Right 05/23/2020   Procedure: RIGHT TOTAL POSTERIOR HIP REVISION;  Surgeon: Frederik Pear, MD;  Location: [facility];  Service: Orthopedics;  Laterality: Right;   TOTAL SHOULDER ARTHROPLASTY Right 12/19/2017   Procedure: RIGHT TOTAL SHOULDER ARTHROPLASTY;  Surgeon: Tania Ade, MD;  Location: [facility];  Service: Orthopedics;  Laterality: Right;   TUBAL LIGATION     Family History  Problem Relation Age of Onset   Diabetes Mother    Heart disease Mother    Hypertension Mother    Stroke Mother    Diabetes Father    Heart disease Father        Triple Bypass   Hypertension Father    Heart attack Brother    Congestive Heart Failure Brother    Hypertension Brother    Heart disease Brother    Hypertension Sister    Hypertension Sister    Cancer Sister        uterine   Hypertension Sister    Hypertension Sister    Hypertension Sister    Hypertension Sister    Cancer Sister        uterine   Colon cancer Neg Hx    Social History   Socioeconomic History   Marital status: Married    Spouse name: Not on file   Number of children: 1   Years of education: 12th grade +   Highest education level: Not on file  Occupational History   Occupation: Beautician  Tobacco Use   Smoking status: Never   Smokeless tobacco: Never  Vaping Use   Vaping Use: Never used  Substance and Sexual Activity   Alcohol use: No    Alcohol/week: 0.0 standard drinks   Drug use: No   Sexual activity: Not Currently    Partners: Male    Birth control/protection: Post-menopausal  Other Topics Concern   Not on file  Social History Narrative   Lives with her husband.   Adult daughter lives nearby.   Social Determinants of Health   Financial Resource Strain: Low  Risk    Difficulty of Paying Living Expenses: Not hard at all  Food Insecurity: No Food Insecurity   Worried About Charity fundraiser in the Last Year: Never true   [place] in the Last Year: Never true  Transportation Needs: No Transportation Needs   Lack of Transportation (Medical): No   Lack of Transportation (Non-Medical): No  Physical Activity: Insufficiently Active   Days of Exercise per Week: 4 days   Minutes of Exercise per Session: 30 min  Stress: No Stress Concern Present   Feeling of Stress :  Not at all  Social Connections: Socially Integrated   Frequency of Communication with Friends and Family: More than three times a week   Frequency of Social Gatherings with Friends and Family: More than three times a week   Attends Religious Services: More than 4 times per year   Active Member of [employer] or Organizations: Yes   Attends Archivist Meetings: 1 to 4 times per year   Marital Status: Married    Tobacco Counseling Counseling given: Not Answered   Clinical Intake:  Pre-visit preparation completed: Yes  Pain : No/denies pain     Nutritional Risks: None Diabetes: No  How often do you need to have someone help you when you read instructions, pamphlets, or other written materials from your doctor or pharmacy?: 1 - Never  Diabetic?no  Interpreter Needed?: No  Information entered by :: Leroy Kennedy LPN   Activities of Daily Living In your present state of health, do you have any difficulty performing the following activities: 03/27/2021 05/23/2020  Hearing? N N  Vision? N N  Difficulty concentrating or making decisions? N Y  Walking or climbing stairs? N Y  Dressing or bathing? N N  Doing errands, shopping? N N  Preparing Food and eating ? N -  Using the Toilet? N -  In the past six months, have you accidently leaked urine? N -  Do you have problems with loss of bowel control? N -  Managing your Medications? N -  Managing your Finances? N -   Housekeeping or managing your Housekeeping? N -  Some recent data might be hidden    Patient Care Team: Wendie Agreste, MD as PCP - General (Family Medicine) Nunzio Cobbs, MD as Attending Physician (Obstetrics and Gynecology) Tania Ade, MD as Consulting Physician (Orthopedic Surgery) Frederik Pear, MD as Consulting Physician (Orthopedic Surgery)  Indicate any recent Medical Services you may have received from other than [facility] providers in the past year (date may be approximate).     Assessment:   This is a routine wellness examination for Angela Estrada.  Hearing/Vision screen Hearing Screening - Comments:: No trouble hearing Vision Screening - Comments:: Up to date Dr. Nicole Cella  Dietary issues and exercise activities discussed: Current Exercise Habits: Home exercise routine, Type of exercise: strength training/weights;walking, Time (Minutes): 30, Frequency (Times/Week): 3, Weekly Exercise (Minutes/Week): 90, Intensity: Mild   Goals Addressed             This Visit's Progress    Patient Stated   Not on track    Loose some weight.     Patient Stated       Loose some wieght       Depression Screen PHQ 2/9 Scores 03/27/2021 10/31/2020 08/12/2020 08/03/2020 07/27/2020 05/02/2020 03/14/2020  PHQ - 2 Score 0 0 0 0 0 0 0  PHQ- 9 Score - 0 - - - - -    Fall Risk Fall Risk  10/31/2020 08/12/2020 08/03/2020 07/27/2020 05/02/2020  Falls in the past year? 0 0 0 0 0  Number falls in past yr: - 0 - - -  Injury with Fall? - 0 - - -  Risk for fall due to : - - - - -  Follow up Falls evaluation completed Falls evaluation completed Falls evaluation completed Falls evaluation completed Falls evaluation completed    [place]:  Any stairs in or around the home? Yes  If so, are there any without handrails? No  Home free of loose throw rugs in walkways, pet beds, electrical cords, etc? Yes  Adequate lighting in your home to reduce risk of  falls? Yes   ASSISTIVE DEVICES UTILIZED TO PREVENT FALLS:  Life alert? No  Use of a cane, walker or w/c? Yes  Grab bars in the bathroom? Yes  Shower chair or bench in shower? Yes  Elevated toilet seat or a handicapped toilet? Yes   TIMED UP AND GO:  Was the test performed? No .    Cognitive Function:  Normal cognitive status assessed by direct observation by this Nurse Health Advisor. No abnormalities found.       6CIT Screen 02/10/2020 02/03/2019  What Year? 0 points 0 points  What month? 0 points 0 points  What time? 0 points 0 points  Count back from 20 0 points 0 points  Months in reverse 0 points 0 points  Repeat phrase 0 points 0 points  Total Score 0 0    Immunizations Immunization History  Administered Date(s) Administered   Fluad Quad(high Dose 65+) 02/10/2019, 03/14/2020   Influenza Split 05/20/2012   Influenza, High Dose Seasonal PF 03/13/2018   Influenza,inj,Quad PF,6+ Mos 02/11/2014, 03/01/2015, 03/21/2016, 04/02/2017   PFIZER(Purple Top)SARS-COV-2 Vaccination 08/04/2019, 08/25/2019, 05/14/2020   Pneumococcal Conjugate-13 11/01/2017   Pneumococcal Polysaccharide-23 03/21/2016   Td 08/04/2001   Tdap 10/02/2013   Zoster Recombinat (Shingrix) 01/21/2019, 09/17/2019   Zoster, Live 03/29/2014    TDAP status: Up to date  Flu Vaccine status: Due, Education has been provided regarding the importance of this vaccine. Advised may receive this vaccine at local pharmacy or Health Dept. Aware to provide a copy of the vaccination record if obtained from local pharmacy or Health Dept. Verbalized acceptance and understanding.  Pneumococcal vaccine status: Up to date  Covid-19 vaccine status: Information provided on how to obtain vaccines.   Qualifies for Shingles Vaccine? No   Zostavax completed Yes   Shingrix Completed?: Yes  Screening Tests Health Maintenance  Topic Date Due   COVID-19 Vaccine (4 - Booster for Pfizer series) 08/06/2020   INFLUENZA VACCINE   01/09/2021   MAMMOGRAM  01/18/2023   TETANUS/TDAP  10/03/2023   COLONOSCOPY (Pts 45-16yrs Insurance coverage will need to be confirmed)  03/21/2027   DEXA SCAN  Completed   Hepatitis C Screening  Completed   Zoster Vaccines- Shingrix  Completed   HPV VACCINES  Aged Out    Health Maintenance  Health Maintenance Due  Topic Date Due   COVID-19 Vaccine (4 - Booster for [place] series) 08/06/2020   INFLUENZA VACCINE  01/09/2021    Colorectal cancer screening: Type of screening: Colonoscopy. Completed 2018. Repeat every 10 years  Mammogram status: Completed  . Repeat every year  Bone Density status: Completed  . Results reflect: Bone density results: NORMAL. Repeat every 5 years.  Lung Cancer Screening: (Low Dose CT Chest recommended if Age 43-80 years, 30 pack-year currently smoking OR have quit w/in 15years.) does not qualify.  Lung Cancer Screening Referral:   Additional Screening:  Hepatitis C Screening: does not qualify; Completed 2014  Vision Screening: Recommended annual ophthalmology exams for early detection of glaucoma and other disorders of the eye. Is the patient up to date with their annual eye exam?  Yes  Who is the provider or what is the name of the office in which the patient attends annual eye exams? Dr. Nicole Cella If pt is not established with a provider, would they like to be referred to a provider to establish  care? No .   Dental Screening: Recommended annual dental exams for proper oral hygiene  Community Resource Referral / Chronic Care Management: CRR required this visit?  No   CCM required this visit?  No      Plan:     I have personally reviewed and noted the following in the patient's chart:   Medical and social history Use of alcohol, tobacco or illicit drugs  Current medications and supplements including opioid prescriptions.  Functional ability and status Nutritional status Physical activity Advanced directives List of other  physicians Hospitalizations, surgeries, and ER visits in previous 12 months Vitals Screenings to include cognitive, depression, and falls Referrals and appointments  In addition, I have reviewed and discussed with patient certain preventive protocols, quality metrics, and best practice recommendations. A written personalized care plan for preventive services as well as general preventive health recommendations were provided to patient.     Leroy Kennedy, LPN   [id number]   Nurse Notes:

## 2021-04-04 DIAGNOSIS — M25551 Pain in right hip: Secondary | ICD-10-CM | POA: Diagnosis not present

## 2021-05-01 DIAGNOSIS — I10 Essential (primary) hypertension: Secondary | ICD-10-CM | POA: Diagnosis not present

## 2021-05-01 DIAGNOSIS — R7303 Prediabetes: Secondary | ICD-10-CM | POA: Diagnosis not present

## 2021-05-01 DIAGNOSIS — E785 Hyperlipidemia, unspecified: Secondary | ICD-10-CM | POA: Diagnosis not present

## 2021-05-03 ENCOUNTER — Ambulatory Visit: Payer: Medicare HMO | Admitting: Family Medicine

## 2021-05-10 ENCOUNTER — Other Ambulatory Visit: Payer: Self-pay | Admitting: Family Medicine

## 2021-05-10 DIAGNOSIS — N3941 Urge incontinence: Secondary | ICD-10-CM

## 2021-05-18 ENCOUNTER — Ambulatory Visit: Payer: Medicare HMO | Admitting: Obstetrics and Gynecology

## 2021-05-24 ENCOUNTER — Ambulatory Visit (INDEPENDENT_AMBULATORY_CARE_PROVIDER_SITE_OTHER): Payer: Medicare HMO | Admitting: Family Medicine

## 2021-05-24 VITALS — BP 128/64 | HR 61 | Temp 98.2°F | Resp 17 | Ht 62.0 in | Wt 222.8 lb

## 2021-05-24 DIAGNOSIS — I10 Essential (primary) hypertension: Secondary | ICD-10-CM | POA: Diagnosis not present

## 2021-05-24 DIAGNOSIS — Z23 Encounter for immunization: Secondary | ICD-10-CM | POA: Diagnosis not present

## 2021-05-24 DIAGNOSIS — E7800 Pure hypercholesterolemia, unspecified: Secondary | ICD-10-CM | POA: Diagnosis not present

## 2021-05-24 MED ORDER — AMLODIPINE BESYLATE 2.5 MG PO TABS: 2.5000 mg | ORAL_TABLET | Freq: Every day | ORAL | 1 refills | Status: DC

## 2021-05-24 MED ORDER — FAMOTIDINE 20 MG PO TABS: 20.0000 mg | ORAL_TABLET | Freq: Two times a day (BID) | ORAL | 1 refills | Status: DC

## 2021-05-24 MED ORDER — TELMISARTAN-HCTZ 80-25 MG PO TABS: 1.0000 | ORAL_TABLET | Freq: Every day | ORAL | 1 refills | Status: DC

## 2021-05-24 NOTE — Progress Notes (Signed)
Subjective:  Patient ID: Angela Estrada, female    DOB: 1950-11-04  Age: 70 y.o. MRN: [id number]  CC:  Chief Complaint  Patient presents with   Hyperlipidemia    Here for recheck, no concerns    Hypertension    HPI Angela Estrada presents for   Hypertension: Amlodpine 2.5mg  qd, micardis hct 80/25mg  qd, potassium 77meq BID, toprol XL 25mg  qd. Cardiology - Dr. Terrence Dupont. - requests labs sent to him as well.  No new side effects.  Home readings:120/60.  No CP, dizziness, dyspnea.  BP Readings from Last 3 Encounters:  05/24/21 128/64  10/31/20 132/74  08/12/20 130/65   Lab Results  Component Value Date   CREATININE 0.71 11/01/2020    Hyperlipidemia: Pravastatin 20mg  qd.  No new myalgias.   Lab Results  Component Value Date   CHOL 181 11/01/2020   HDL 63 11/01/2020   LDLCALC 102 (H) 11/01/2020   TRIG 82 11/01/2020   CHOLHDL 2.9 11/01/2020   Lab Results  Component Value Date   ALT 21 11/01/2020   AST 24 11/01/2020   ALKPHOS 55 11/01/2020   BILITOT 1.0 11/01/2020   HM:  Immunization History  Administered Date(s) Administered   Fluad Quad(high Dose 65+) 02/10/2019, 03/14/2020, 05/24/2021   Influenza Split 05/20/2012   Influenza, High Dose Seasonal PF 03/13/2018   Influenza,inj,Quad PF,6+ Mos 02/11/2014, 03/01/2015, 03/21/2016, 04/02/2017   PFIZER(Purple Top)SARS-COV-2 Vaccination 08/04/2019, 08/25/2019, 05/14/2020   Pneumococcal Conjugate-13 11/01/2017   Pneumococcal Polysaccharide-23 03/21/2016   Td 08/04/2001   Tdap 10/02/2013   Zoster Recombinat (Shingrix) 01/21/2019, 09/17/2019   Zoster, Live 03/29/2014  Has received bivalent booster.  Due for updated pneumonia vaccine - pneumovax 23.   GERD: Stable with pepcid daily - 1-2 times based on sx's.    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   Arthritis of right hip 03/19/2016   Primary osteoarthritis of right hip 03/18/2016   Hypokalemia 03/29/2014   Arthralgia 02/11/2014   Routine general medical examination at a health care facility 03/28/2013   Nonspecific abnormal findings on radiological and examination of lung field 03/27/2013   Cough 03/11/2013   Unspecified disorder of liver 03/09/2013   Pyuria 03/09/2013   Arthritis    Ventral hernia 01/18/2012   Encounter for long-term (current) use of other medications 07/05/2011   HYPERGLYCEMIA 08/12/2008   HYPERCHOLESTEROLEMIA 04/25/2007   DIZZINESS AND GIDDINESS 04/25/2007   Essential hypertension 01/03/2007   ALLERGIC RHINITIS 01/03/2007   Past Medical History:  Diagnosis Date   Abdominal hernia    RLQ   Allergy    Arthritis    Arthritis    Borderline diabetes    GERD (gastroesophageal reflux disease)    HYPERCHOLESTEROLEMIA 04/25/2007   takes Pravastatin daily   HYPERTENSION 01/03/2007   takes Losartan-HCTZ  and Amlodipine daily   Hypokalemia    takes Potassium daily   Joint pain    Nocturia    Pneumonia 2 yrs ago   Primary osteoarthritis of shoulder    Right   Past Surgical History:  Procedure Laterality Date   CESAREAN SECTION  25 years ago   [place]  35 years ago   [place]  HERNIA REPAIR  02/21/2012   Procedure: LAPAROSCOPIC INCISIONAL HERNIA;  Surgeon: Harl Bowie, MD;  Location: [facility];  Service: General;  Laterality: N/A;  laparoscopic incisional hernia repair with mesh   JOINT REPLACEMENT     bilateral knee replacements   REPLACEMENT TOTAL KNEE BILATERAL  2010   TOTAL HIP ARTHROPLASTY Right 03/19/2016   Procedure: RIGHT TOTAL HIP ARTHROPLASTY ANTERIOR APPROACH;  Surgeon: Frederik Pear, MD;  Location: [facility];  Service: Orthopedics;  Laterality: Right;   TOTAL HIP REVISION Right  10/02/2017   Procedure: TOTAL HIP REVISION POSTERIOR;  Surgeon: Frederik Pear, MD;  Location: [facility];  Service: Orthopedics;  Laterality: Right;   TOTAL HIP REVISION Right 05/23/2020   Procedure: RIGHT TOTAL POSTERIOR HIP REVISION;  Surgeon: Frederik Pear, MD;  Location: [facility];  Service: Orthopedics;  Laterality: Right;   TOTAL SHOULDER ARTHROPLASTY Right 12/19/2017   Procedure: RIGHT TOTAL SHOULDER ARTHROPLASTY;  Surgeon: Tania Ade, MD;  Location: [facility];  Service: Orthopedics;  Laterality: Right;   TUBAL LIGATION     Allergies  Allergen Reactions   Lisinopril Cough   Oxycodone Other (See Comments)    Hallucinations   Prior to Admission medications   Medication Sig Start Date End Date Taking? Authorizing Provider  amLODipine (NORVASC) 2.5 MG tablet Take 1 tablet (2.5 mg total) by mouth daily. 10/31/20  Yes Wendie Agreste, MD  Apoaequorin (PREVAGEN) 10 MG CAPS Take 10 mg by mouth daily.   Yes [provider]  aspirin EC 81 MG tablet Take 1 tablet (81 mg total) by mouth 2 (two) times daily. 05/23/20  Yes Leighton Parody, PA-C  Carboxymethylcellul-Glycerin (LUBRICATING EYE DROPS OP) Place 1 drop into both eyes daily as needed (dry eyes).   Yes [provider]  cetirizine-pseudoephedrine (ZYRTEC-D) 5-120 MG tablet Take 1 tablet by mouth daily as needed for allergies.   Yes [provider]  famotidine (PEPCID) 20 MG tablet TAKE 1 TABLET BY MOUTH TWICE A DAY 01/02/21  Yes Wendie Agreste, MD  metoprolol succinate (TOPROL-XL) 25 MG 24 hr tablet Take 25 mg by mouth daily. 08/03/20  Yes [provider]  Multiple Vitamin (MULTIVITAMIN WITH MINERALS) TABS tablet Take 1 tablet by mouth daily.   Yes [provider]  oxybutynin (DITROPAN) 5 MG tablet TAKE 1 TABLET BY MOUTH TWICE A DAY 05/10/21  Yes Wendie Agreste, MD  potassium chloride SA (KLOR-CON M20) 20 MEQ tablet Take 1 tablet (20 mEq total) by mouth 2 (two) times daily. 11/18/20  Yes Wendie Agreste, MD  pravastatin (PRAVACHOL) 20 MG tablet Take 1 tablet (20 mg total) by mouth every evening. 10/31/20  Yes Wendie Agreste, MD  telmisartan-hydrochlorothiazide (MICARDIS HCT) 80-25 MG tablet Take 1 tablet by mouth daily. 10/31/20  Yes Wendie Agreste, MD   Social History   Socioeconomic History   Marital status: Married    Spouse name: Not on file   Number of children: 1   Years of education: 12th grade +   Highest education level: Not on file  Occupational History   Occupation: Beautician  Tobacco Use   Smoking status: Never   Smokeless tobacco: Never  Vaping Use   Vaping Use: Never used  Substance and Sexual Activity   Alcohol use: No    Alcohol/week: 0.0 standard drinks   Drug use: No   Sexual activity: Not Currently    Partners: Male    Birth control/protection: Post-menopausal  Other Topics Concern   Not on file  Social History Narrative   Lives with her husband.   Adult daughter lives nearby.   Social Determinants of Health   Financial Resource Strain: Low Risk    Difficulty of Paying Living Expenses: Not hard at all  Food Insecurity: No Food Insecurity   Worried About Charity fundraiser in the Last Year: Never true   [place] in the Last Year: Never true  Transportation Needs: No Transportation Needs   Lack of Transportation (Medical): No   Lack of Transportation (Non-Medical): No  Physical Activity: Insufficiently Active   Days of Exercise per Week: 4 days   Minutes of Exercise per Session: 30 min  Stress: No Stress Concern Present   Feeling of Stress : Not at all  Social Connections: Socially Integrated   Frequency of Communication with Friends and Family: More than three times a week   Frequency of Social Gatherings with Friends and Family: More than three times a week   Attends Religious Services: More than 4 times per year   Active Member of [employer] or Organizations: Yes   Attends Archivist Meetings: 1 to 4 times per year    Marital Status: Married  Human resources officer Violence: Not At Risk   Fear of Current or Ex-Partner: No   Emotionally Abused: No   Physically Abused: No   Sexually Abused: No    Review of Systems  Constitutional:  Negative for fatigue and unexpected weight change.  Respiratory:  Negative for chest tightness and shortness of breath.   Cardiovascular:  Negative for chest pain, palpitations and leg swelling.  Gastrointestinal:  Negative for abdominal pain and blood in stool.  Neurological:  Negative for dizziness, syncope, light-headedness and headaches.    Objective:   Vitals:   05/24/21 1143  BP: 128/64  Pulse: 61  Resp: 17  Temp: 98.2 F (36.8 C)  TempSrc: Temporal  SpO2: 97%  Weight: 222 lb 12.8 oz (101.1 kg)  Height: 5\' 2"  (1.575 m)     Physical Exam Vitals reviewed.  Constitutional:      Appearance: Normal appearance. She is well-developed.  HENT:     Head: Normocephalic and atraumatic.  Eyes:     Conjunctiva/sclera: Conjunctivae normal.     Pupils: Pupils are equal, round, and reactive to light.  Neck:     Vascular: No carotid bruit.  Cardiovascular:     Rate and Rhythm: Normal rate and regular rhythm.     Heart sounds: Normal heart sounds.  Pulmonary:     Effort: Pulmonary effort is normal.     Breath sounds: Normal breath sounds.  Abdominal:     Palpations: Abdomen is soft. There is no pulsatile mass.     Tenderness: There is no abdominal tenderness.  Musculoskeletal:     Right lower leg: No edema.     Left lower leg: No edema.  Skin:    General: Skin is warm and dry.  Neurological:     Mental Status: She is alert and oriented to person, place, and time.  Psychiatric:        Mood and Affect: Mood normal.        Behavior: Behavior normal.       Assessment & Plan:  Angela Estrada is a 70 y.o. female . Pure hypercholesterolemia - Plan: Comprehensive metabolic panel, Lipid panel  -  Stable, tolerating current regimen.  Labs pending as above.   Can send copy to her cardiologist.  Essential hypertension - Plan: amLODipine (NORVASC)  2.5 MG tablet, telmisartan-hydrochlorothiazide (MICARDIS HCT) 80-25 MG tablet  -Stable here and on home readings, tolerating current regimen.  Few refills of meds needed as above.  No changes.  Need for influenza vaccination - Plan: Flu Vaccine QUAD High Dose(Fluad)  Need for vaccination for Strep pneumoniae - Plan: Pneumococcal polysaccharide vaccine 23-valent greater than or equal to 2yo subcutaneous/IM  Heartburn stable with Pepcid, continue same. Meds ordered this encounter  Medications   amLODipine (NORVASC) 2.5 MG tablet    Sig: Take 1 tablet (2.5 mg total) by mouth daily.    Dispense:  90 tablet    Refill:  1   famotidine (PEPCID) 20 MG tablet    Sig: Take 1 tablet (20 mg total) by mouth 2 (two) times daily.    Dispense:  180 tablet    Refill:  1   telmisartan-hydrochlorothiazide (MICARDIS HCT) 80-25 MG tablet    Sig: Take 1 tablet by mouth daily.    Dispense:  90 tablet    Refill:  1   Patient Instructions  Thanks for coming in today.  I will check some lab work.  Should be able to forward that to your cardiologist once results have returned.  Updated pneumonia shot given today, should not need further ones after today's dose.  Take care.  I will see you in 6 months.  Let me know if there are questions sooner    Signed,   Merri Ray, MD [facility], [facility] 05/24/21 12:38 PM

## 2021-05-24 NOTE — Patient Instructions (Signed)
Thanks for coming in today.  I will check some lab work.  Should be able to forward that to your cardiologist once results have returned.  Updated pneumonia shot given today, should not need further ones after today's dose.  Take care.  I will see you in 6 months.  Let me know if there are questions sooner

## 2021-05-25 LAB — COMPREHENSIVE METABOLIC PANEL
ALT: 17 U/L (ref 0–35)
AST: 21 U/L (ref 0–37)
Albumin: 4.2 g/dL (ref 3.5–5.2)
Alkaline Phosphatase: 64 U/L (ref 39–117)
BUN: 12 mg/dL (ref 6–23)
CO2: 30 mEq/L (ref 19–32)
Calcium: 10.5 mg/dL (ref 8.4–10.5)
Chloride: 100 mEq/L (ref 96–112)
Creatinine, Ser: 0.79 mg/dL (ref 0.40–1.20)
GFR: 76.63 mL/min (ref >60.00)
Glucose, Bld: 88 mg/dL (ref 70–99)
Potassium: 4 mEq/L (ref 3.5–5.1)
Sodium: 138 mEq/L (ref 135–145)
Total Bilirubin: 1 mg/dL (ref 0.2–1.2)
Total Protein: 7.6 g/dL (ref 6.0–8.3)

## 2021-05-25 LAB — LIPID PANEL
Cholesterol: 173 mg/dL (ref 0–200)
HDL: 66.5 mg/dL (ref >39.00)
LDL Cholesterol: 92 mg/dL (ref 0–99)
NonHDL: 106.3
Total CHOL/HDL Ratio: 3
Triglycerides: 74 mg/dL (ref 0.0–149.0)
VLDL: 14.8 mg/dL (ref 0.0–40.0)

## 2021-06-15 NOTE — Progress Notes (Signed)
71 y.o. G49P1001 Married [place] American female here for annual exam.    No GYN concerns.  No bleeding or pain.   Still taking Ditropan XL for overactive bladder.  PCP prescribing.   PCP:  Merri Ray, MD  Patient's last menstrual period was 08/30/1998 (approximate).           Sexually active: No.  The current method of family planning is tubal ligation.    Exercising: Yes.     Walking, squats Smoker:  no  Health Maintenance: Pap:   11-24-18 Neg, 06-08-16 Neg:Neg HR HPV History of abnormal Pap:  yes,  1990 had abnormal pap but normal on repeat--no colposcopy.  Paps normal since. MMG:  01-17-21 Neg/BiRads1 Colonoscopy:  03/20/17 Normal repeat 10 years BMD:  02-02-19  Result :Normal TDaP:  10-02-13 Gardasil:   no HIV: 05-17-16 NR Hep C: 03-10-16 Neg Screening Labs: PCP. Flu vaccine:  completed.  Pneumonia vaccine:  completed.  Covid:  completed bivalent booster.  Shingrix:  completed.    reports that she has never smoked. She has never used smokeless tobacco. She reports that she does not drink alcohol and does not use drugs.  Past Medical History:  Diagnosis Date   Abdominal hernia    RLQ   Allergy    Arthritis    Arthritis    Borderline diabetes    GERD (gastroesophageal reflux disease)    HYPERCHOLESTEROLEMIA 04/25/2007   takes Pravastatin daily   HYPERTENSION 01/03/2007   takes Losartan-HCTZ  and Amlodipine daily   Hypokalemia    takes Potassium daily   Joint pain    Nocturia    Pneumonia 2 yrs ago   Primary osteoarthritis of shoulder    Right    Past Surgical History:  Procedure Laterality Date   CESAREAN SECTION  25 years ago   [place]  35 years ago   [place]  02/21/2012   Procedure: LAPAROSCOPIC INCISIONAL HERNIA;  Surgeon: Harl Bowie, MD;  Location: [facility];  Service: General;  Laterality: N/A;  laparoscopic incisional hernia repair with mesh   JOINT  REPLACEMENT     bilateral knee replacements   REPLACEMENT TOTAL KNEE BILATERAL  2010   TOTAL HIP ARTHROPLASTY Right 03/19/2016   Procedure: RIGHT TOTAL HIP ARTHROPLASTY ANTERIOR APPROACH;  Surgeon: Frederik Pear, MD;  Location: [facility];  Service: Orthopedics;  Laterality: Right;   TOTAL HIP REVISION Right 10/02/2017   Procedure: TOTAL HIP REVISION POSTERIOR;  Surgeon: Frederik Pear, MD;  Location: [facility];  Service: Orthopedics;  Laterality: Right;   TOTAL HIP REVISION Right 05/23/2020   Procedure: RIGHT TOTAL POSTERIOR HIP REVISION;  Surgeon: Frederik Pear, MD;  Location: [facility];  Service: Orthopedics;  Laterality: Right;   TOTAL SHOULDER ARTHROPLASTY Right 12/19/2017   Procedure: RIGHT TOTAL SHOULDER ARTHROPLASTY;  Surgeon: Tania Ade, MD;  Location: [facility];  Service: Orthopedics;  Laterality: Right;   TUBAL LIGATION      Current Outpatient Medications  Medication Sig Dispense Refill   amLODipine (NORVASC) 2.5 MG tablet Take 1 tablet (2.5 mg total) by mouth daily. 90 tablet 1   Apoaequorin (PREVAGEN) 10 MG CAPS Take 10 mg by mouth daily.     aspirin EC 81 MG tablet Take 1 tablet (81 mg total) by mouth 2 (two) times daily. 60 tablet 0   Carboxymethylcellul-Glycerin (LUBRICATING EYE DROPS OP) Place 1 drop into both eyes daily  as needed (dry eyes).     cetirizine-pseudoephedrine (ZYRTEC-D) 5-120 MG tablet Take 1 tablet by mouth daily as needed for allergies.     famotidine (PEPCID) 20 MG tablet Take 1 tablet (20 mg total) by mouth 2 (two) times daily. 180 tablet 1   metoprolol succinate (TOPROL-XL) 25 MG 24 hr tablet Take 25 mg by mouth daily.     Multiple Vitamin (MULTIVITAMIN WITH MINERALS) TABS tablet Take 1 tablet by mouth daily.     oxybutynin (DITROPAN) 5 MG tablet TAKE 1 TABLET BY MOUTH TWICE A DAY 180 tablet 1   potassium chloride SA (KLOR-CON M20) 20 MEQ tablet Take 1 tablet (20 mEq total) by mouth 2 (two) times daily. 180 tablet 1   pravastatin (PRAVACHOL) 20 MG tablet Take 1 tablet (20  mg total) by mouth every evening. 90 tablet 3   telmisartan-hydrochlorothiazide (MICARDIS HCT) 80-25 MG tablet Take 1 tablet by mouth daily. 90 tablet 1   No current facility-administered medications for this visit.    Family History  Problem Relation Age of Onset   Diabetes Mother    Heart disease Mother    Hypertension Mother    Stroke Mother    Diabetes Father    Heart disease Father        Triple Bypass   Hypertension Father    Heart attack Brother    Congestive Heart Failure Brother    Hypertension Brother    Heart disease Brother    Hypertension Sister    Hypertension Sister    Cancer Sister        uterine   Hypertension Sister    Hypertension Sister    Hypertension Sister    Hypertension Sister    Cancer Sister        uterine   Colon cancer Neg Hx     Review of Systems  All other systems reviewed and are negative.  Exam:   BP 130/66    Pulse 75    Ht 5\' 2"  (1.575 m)    Wt 224 lb (101.6 kg)    LMP 08/30/1998 (Approximate)    SpO2 97%    BMI 40.97 kg/m     General appearance: alert, cooperative and appears stated age Head: normocephalic, without obvious abnormality, atraumatic Neck: no adenopathy, supple, symmetrical, trachea midline and thyroid normal to inspection and palpation Lungs: clear to auscultation bilaterally Breasts: normal appearance, no masses or tenderness, No nipple retraction or dimpling, No nipple discharge or bleeding, No axillary adenopathy Heart: regular rate and rhythm Abdomen: soft, non-tender; no masses, no organomegaly Extremities: extremities normal, atraumatic, no cyanosis or edema Skin: skin color, texture, turgor normal. No rashes or lesions Lymph nodes: cervical, supraclavicular, and axillary nodes normal. Neurologic: grossly normal  Pelvic: External genitalia:  no lesions              No abnormal inguinal nodes palpated.              Urethra:  normal appearing urethra with no masses, tenderness or lesions               Bartholins and Skenes: normal                 Vagina: normal appearing vagina with normal color and discharge, no lesions              Cervix: no lesions              Pap taken: yes Bimanual Exam:  Uterus:  normal size, contour, position, consistency, mobility, non-tender              Adnexa: no mass, fullness, tenderness              Rectal exam: yes.  Confirms.              Anus:  normal sphincter tone, no lesions  Chaperone was present for exam:  Estill Bamberg, CMA  Assessment:   Well woman visit with gynecologic exam. Fibroids including 4.8 cm right pedunculated fibroids.  FH gynecologic cancer of unknown type. Overactive bladder. PCP managing.  Plan: Mammogram screening discussed. Self breast awareness reviewed. Pap and HR HPV collected.  Guidelines for Calcium, Vitamin D, regular exercise program including cardiovascular and weight bearing exercise. Follow up in 2 years and prn.  After visit summary provided.

## 2021-06-19 ENCOUNTER — Encounter: Payer: Self-pay | Admitting: Obstetrics and Gynecology

## 2021-06-19 ENCOUNTER — Other Ambulatory Visit (HOSPITAL_COMMUNITY)
Admission: RE | Admit: 2021-06-19 | Discharge: 2021-06-19 | Disposition: A | Discharge status: Home / Self Care | Payer: Medicare HMO | Source: Ambulatory Visit | Attending: Obstetrics and Gynecology | Admitting: Obstetrics and Gynecology

## 2021-06-19 ENCOUNTER — Other Ambulatory Visit: Payer: Self-pay

## 2021-06-19 ENCOUNTER — Ambulatory Visit (INDEPENDENT_AMBULATORY_CARE_PROVIDER_SITE_OTHER): Payer: Medicare HMO | Admitting: Obstetrics and Gynecology

## 2021-06-19 VITALS — BP 130/66 | HR 75 | Ht 62.0 in | Wt 224.0 lb

## 2021-06-19 DIAGNOSIS — Z124 Encounter for screening for malignant neoplasm of cervix: Secondary | ICD-10-CM | POA: Diagnosis not present

## 2021-06-19 DIAGNOSIS — Z01419 Encounter for gynecological examination (general) (routine) without abnormal findings: Secondary | ICD-10-CM | POA: Diagnosis not present

## 2021-06-19 NOTE — Patient Instructions (Signed)

## 2021-06-20 LAB — CYTOLOGY - PAP: Diagnosis: NEGATIVE

## 2021-07-10 ENCOUNTER — Other Ambulatory Visit: Payer: Self-pay | Admitting: Family Medicine

## 2021-07-31 DIAGNOSIS — R7303 Prediabetes: Secondary | ICD-10-CM | POA: Diagnosis not present

## 2021-07-31 DIAGNOSIS — E785 Hyperlipidemia, unspecified: Secondary | ICD-10-CM | POA: Diagnosis not present

## 2021-07-31 DIAGNOSIS — I10 Essential (primary) hypertension: Secondary | ICD-10-CM | POA: Diagnosis not present

## 2021-07-31 DIAGNOSIS — K219 Gastro-esophageal reflux disease without esophagitis: Secondary | ICD-10-CM | POA: Diagnosis not present

## 2021-09-13 DIAGNOSIS — I10 Essential (primary) hypertension: Secondary | ICD-10-CM | POA: Diagnosis not present

## 2021-09-13 DIAGNOSIS — N3281 Overactive bladder: Secondary | ICD-10-CM | POA: Diagnosis not present

## 2021-09-13 DIAGNOSIS — K219 Gastro-esophageal reflux disease without esophagitis: Secondary | ICD-10-CM | POA: Diagnosis not present

## 2021-09-13 DIAGNOSIS — Z96649 Presence of unspecified artificial hip joint: Secondary | ICD-10-CM | POA: Diagnosis not present

## 2021-09-13 DIAGNOSIS — Z7982 Long term (current) use of aspirin: Secondary | ICD-10-CM | POA: Diagnosis not present

## 2021-09-13 DIAGNOSIS — E785 Hyperlipidemia, unspecified: Secondary | ICD-10-CM | POA: Diagnosis not present

## 2021-09-27 IMAGING — DX DG CHEST 2V
2 series · 2 of 2 positions shown · non-contrast
Comparison: 05/13/2020

CLINICAL DATA: Chest, neck and jaw pain.

EXAM:
CHEST - 2 VIEW

[chest pa]
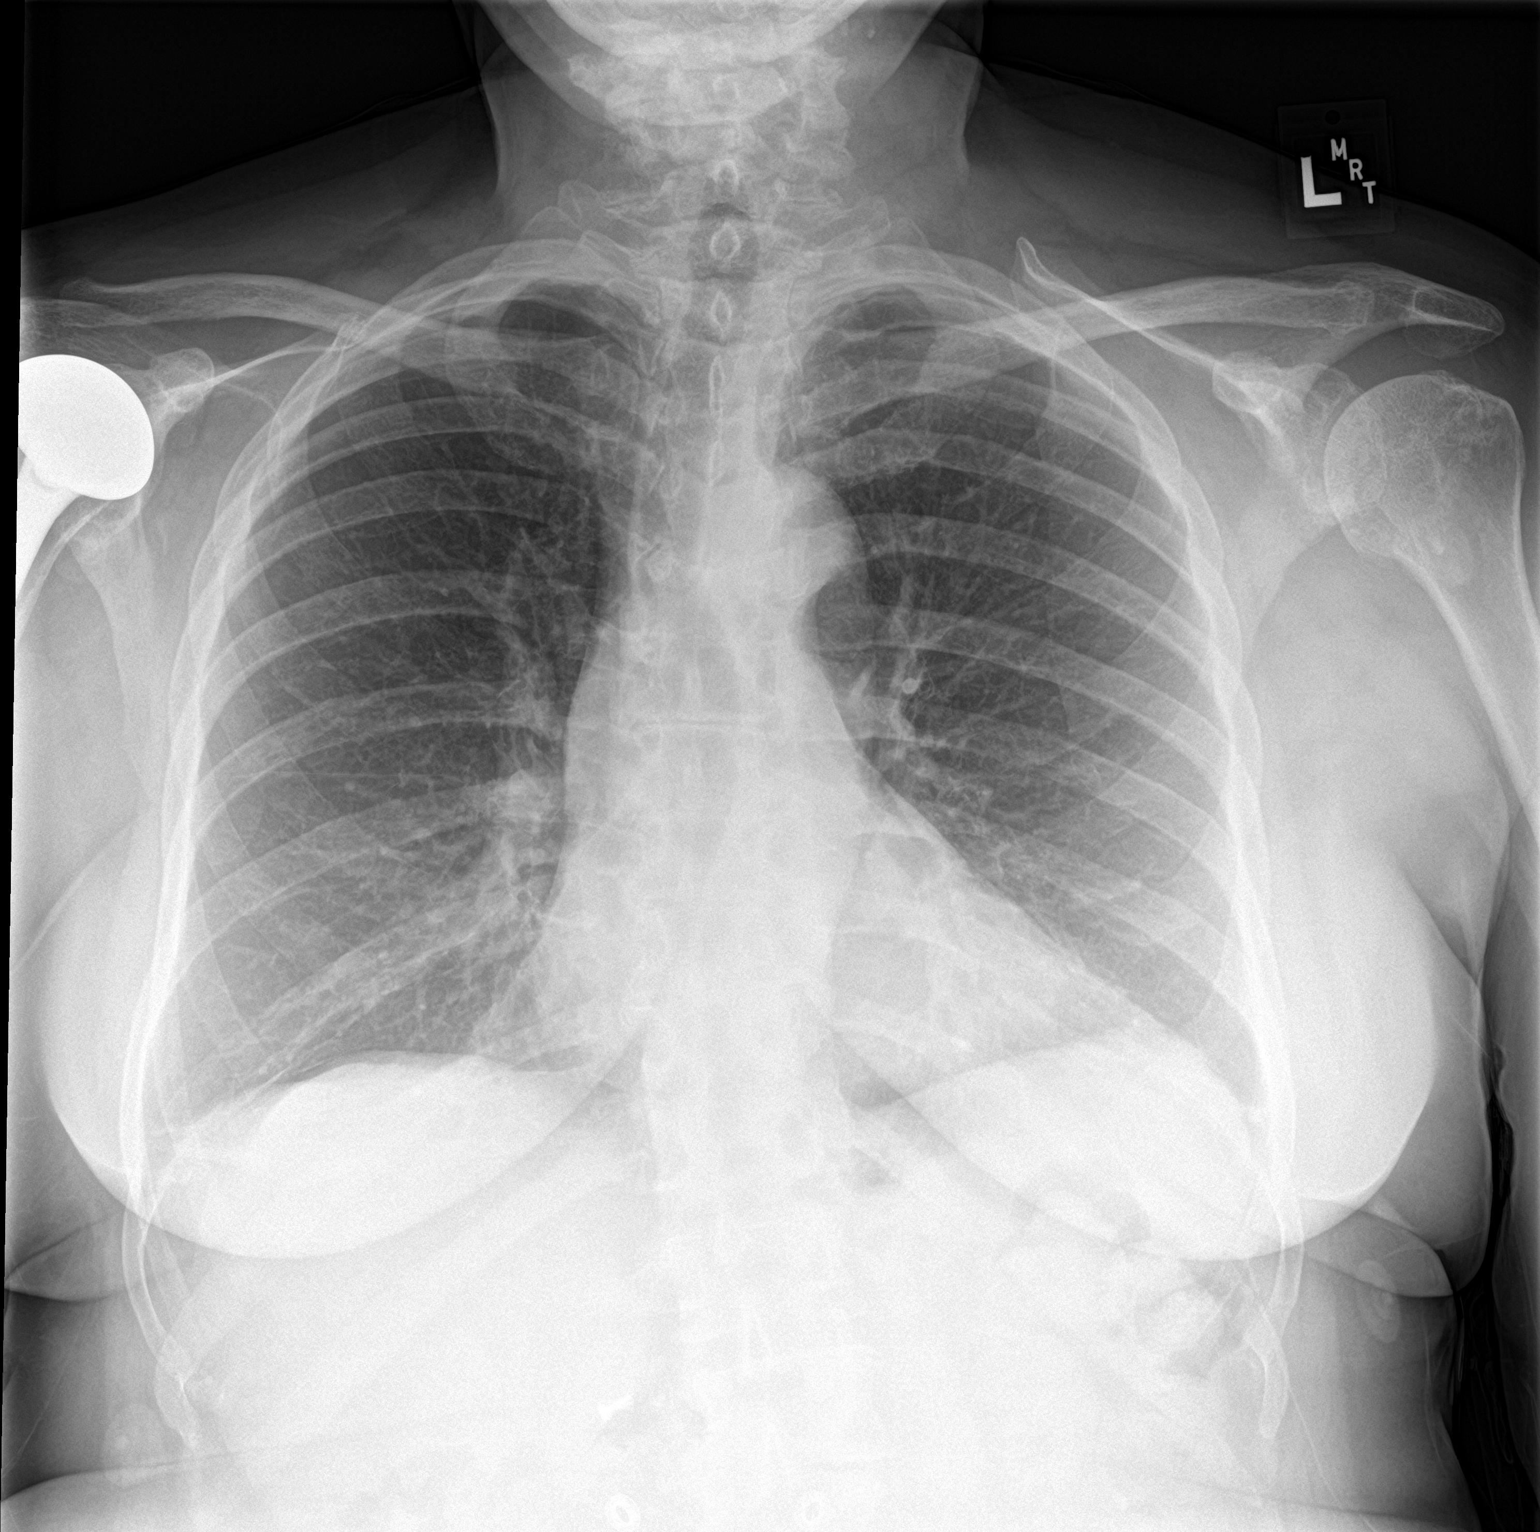

[chest lat]
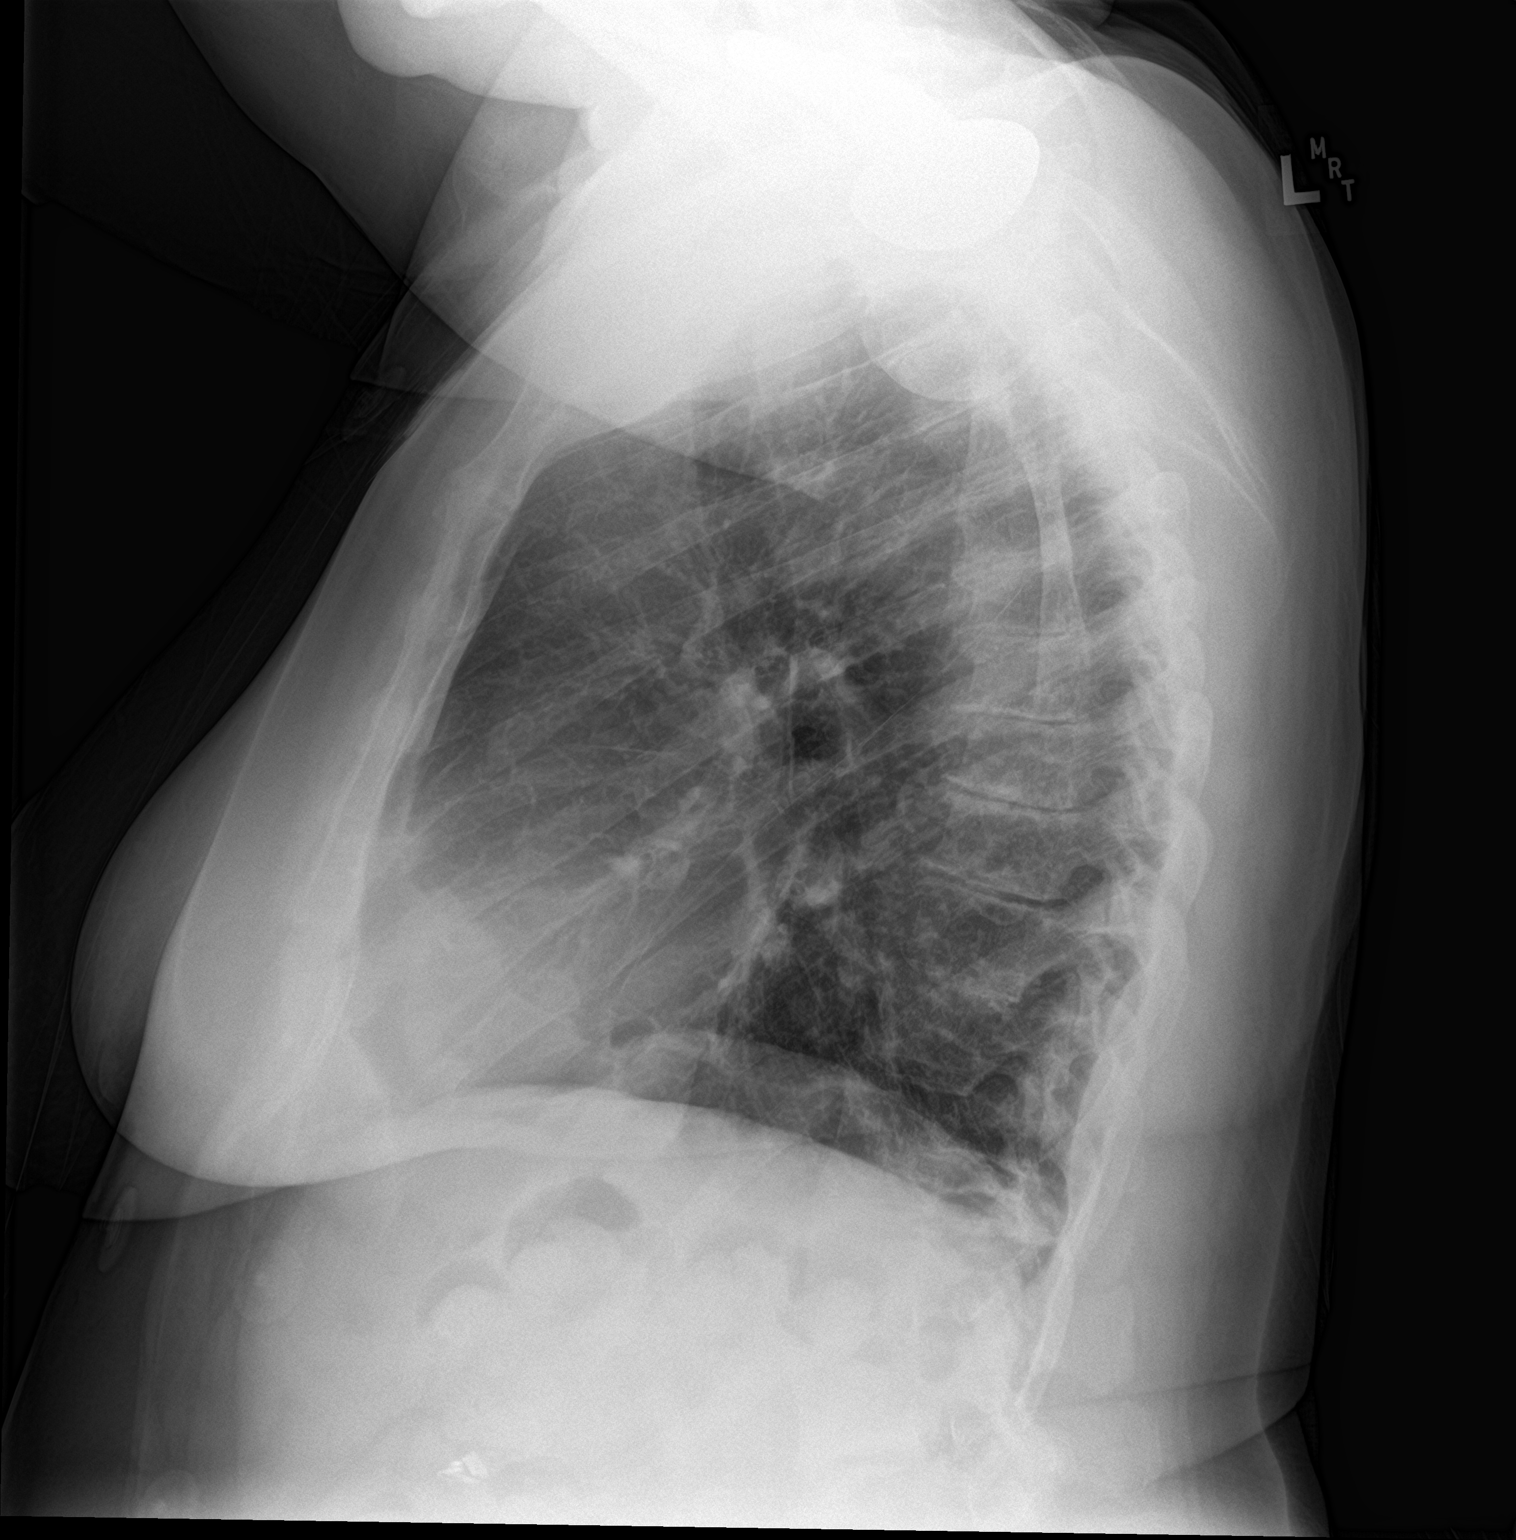

[2 of 2 positions shown; findings below may reference images not displayed]

FINDINGS: Heart size is normal. There is tortuosity of the aorta. There is
mild linear scarring at the lung bases as seen previously. No sign
of active infiltrate, mass, effusion or collapse. No pulmonary
edema.
IMPRESSION: No active disease. Mild linear scarring at the lung bases.

## 2021-10-18 ENCOUNTER — Ambulatory Visit (INDEPENDENT_AMBULATORY_CARE_PROVIDER_SITE_OTHER): Payer: Medicare HMO | Admitting: Family Medicine

## 2021-10-18 ENCOUNTER — Encounter: Payer: Self-pay | Admitting: Family Medicine

## 2021-10-18 VITALS — BP 128/76 | HR 75 | Temp 98.4°F | Resp 17 | Ht 62.0 in | Wt 226.4 lb

## 2021-10-18 DIAGNOSIS — R351 Nocturia: Secondary | ICD-10-CM

## 2021-10-18 DIAGNOSIS — N3941 Urge incontinence: Secondary | ICD-10-CM

## 2021-10-18 LAB — POCT URINALYSIS DIP (MANUAL ENTRY)
Bilirubin, UA: NEGATIVE
Blood, UA: NEGATIVE
Glucose, UA: NEGATIVE mg/dL
Ketones, POC UA: NEGATIVE mg/dL
Leukocytes, UA: NEGATIVE
Nitrite, UA: NEGATIVE
Protein Ur, POC: NEGATIVE mg/dL
Spec Grav, UA: 1.01 (ref 1.010–1.025)
Urobilinogen, UA: 0.2 E.U./dL
pH, UA: 6 (ref 5.0–8.0)

## 2021-10-18 MED ORDER — OXYBUTYNIN CHLORIDE 5 MG PO TABS: 5.0000 mg | ORAL_TABLET | Freq: Three times a day (TID) | ORAL | 2 refills | Status: DC

## 2021-10-18 NOTE — Patient Instructions (Addendum)
Increase oxybutynin to 3 times per day for now. If not helping, I can refer you to urology to discuss other options. Myrbetriq may be another option.  ?If any concerns on labs I will let you know.  ?Return to the clinic or go to the nearest emergency room if any of your symptoms worsen or new symptoms occur. ? ?

## 2021-10-18 NOTE — Progress Notes (Signed)
? ?Subjective:  ?Patient ID: Angela Estrada, female    DOB: 16-Jun-1950  Age: 71 y.o. MRN: [id number] ? ?CC:  ?Chief Complaint  ?Patient presents with  ? Urinary Urgency  ?  Pt here for discussion about urgency when needs to urinate, notes this was an issue previously did better but notes now worsening again   ? ? ?HPI ?Angela Estrada presents for  ? ?Urinary urgency  ?Prior similar symptoms that have improved with time, now recent worsening. ?History of urge incontinence treated with Ditropan '5mg'$  BID.  ?Similar sx's but seems to be worsening past month. Sense to urinate happens quickly, some episodes on occasion with incontinence.  ?Slight dysuria past week, no fever/abd/back pain.  ?No hematuria.  ?Nocturia 2-3 times per night. Better for awhile, now worse past month.  ?GYN exam, Pap on 06/19/2021 noted.  Normal Pap test at that time ?Last urine culture noted from 2021 with less than 10,000 CFU's of bacteria. ?No home treatments.  ?Min dry mouth with ditropan.no dizziness.  ? ? ? ?History ?Patient Active Problem List  ? Diagnosis Date Noted  ? History of revision of total replacement of right hip joint 05/23/2020  ? Prediabetes 07/21/2019  ? Status post total shoulder arthroplasty, right 12/19/2017  ? 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 Arthritis of right hip 03/19/2016  ? Primary osteoarthritis of right hip 03/18/2016  ? Hypokalemia 03/29/2014  ? Arthralgia 02/11/2014  ? Routine general medical examination at a health care facility 03/28/2013  ? Nonspecific abnormal findings on radiological and examination of lung field 03/27/2013  ? Cough 03/11/2013  ? Unspecified disorder of liver 03/09/2013  ? Pyuria 03/09/2013  ? Arthritis   ? Ventral hernia 01/18/2012  ? Encounter for long-term (current) use of other medications 07/05/2011  ? HYPERGLYCEMIA 08/12/2008  ? HYPERCHOLESTEROLEMIA 04/25/2007  ? DIZZINESS AND GIDDINESS 04/25/2007  ? Essential hypertension 01/03/2007  ? ALLERGIC RHINITIS  01/03/2007  ? ?Past Medical History:  ?Diagnosis Date  ? Abdominal hernia   ? RLQ  ? Allergy   ? Arthritis   ? Arthritis   ? Borderline diabetes   ? GERD (gastroesophageal reflux disease)   ? HYPERCHOLESTEROLEMIA 04/25/2007  ? takes Pravastatin daily  ? HYPERTENSION 01/03/2007  ? takes Losartan-HCTZ  and Amlodipine daily  ? Hypokalemia   ? takes Potassium daily  ? Joint pain   ? Nocturia   ? Pneumonia 2 yrs ago  ? Primary osteoarthritis of shoulder   ? Right  ? ?Past Surgical History:  ?Procedure Laterality Date  ? CESAREAN SECTION  25 years ago  ? CHOLECYSTECTOMY    ? COLONOSCOPY    ? DILATION AND CURETTAGE OF UTERUS  35 years ago  ? HERNIA REPAIR    ? INCISIONAL HERNIA REPAIR  02/21/2012  ? Procedure: LAPAROSCOPIC INCISIONAL HERNIA;  Surgeon: Harl Bowie, MD;  Location: [facility];  Service: General;  Laterality: N/A;  laparoscopic incisional hernia repair with mesh  ? JOINT REPLACEMENT    ? bilateral knee replacements  ? REPLACEMENT TOTAL KNEE BILATERAL  2010  ? TOTAL HIP ARTHROPLASTY Right 03/19/2016  ? Procedure: RIGHT TOTAL HIP ARTHROPLASTY ANTERIOR APPROACH;  Surgeon: Frederik Pear, MD;  Location: [facility];  Service: Orthopedics;  Laterality: Right;  ? TOTAL HIP REVISION Right 10/02/2017  ? Procedure: TOTAL HIP REVISION POSTERIOR;  Surgeon: Frederik Pear, MD;  Location: [facility];  Service: Orthopedics;  Laterality: Right;  ? TOTAL HIP REVISION Right 05/23/2020  ? Procedure: RIGHT TOTAL POSTERIOR HIP  REVISION;  Surgeon: Frederik Pear, MD;  Location: [facility];  Service: Orthopedics;  Laterality: Right;  ? TOTAL SHOULDER ARTHROPLASTY Right 12/19/2017  ? Procedure: RIGHT TOTAL SHOULDER ARTHROPLASTY;  Surgeon: Tania Ade, MD;  Location: [facility];  Service: Orthopedics;  Laterality: Right;  ? TUBAL LIGATION    ? ?Allergies  ?Allergen Reactions  ? Lisinopril Cough  ? Oxycodone Other (See Comments)  ?  Hallucinations  ? ?Prior to Admission medications   ?Medication Sig Start Date End Date Taking? Authorizing Provider   ?amLODipine (NORVASC) 2.5 MG tablet Take 1 tablet (2.5 mg total) by mouth daily. 05/24/21  Yes Wendie Agreste, MD  ?Apoaequorin (PREVAGEN) 10 MG CAPS Take 10 mg by mouth daily.   Yes [provider]  ?aspirin EC 81 MG tablet Take 1 tablet (81 mg total) by mouth 2 (two) times daily. 05/23/20  Yes Leighton Parody, PA-C  ?Carboxymethylcellul-Glycerin (LUBRICATING EYE DROPS OP) Place 1 drop into both eyes daily as needed (dry eyes).   Yes [provider]  ?cetirizine-pseudoephedrine (ZYRTEC-D) 5-120 MG tablet Take 1 tablet by mouth daily as needed for allergies.   Yes [provider]  ?famotidine (PEPCID) 20 MG tablet Take 1 tablet (20 mg total) by mouth 2 (two) times daily. 05/24/21  Yes Wendie Agreste, MD  ?KLOR-CON M20 20 MEQ tablet TAKE 1 TABLET BY MOUTH TWICE A DAY 07/11/21  Yes Wendie Agreste, MD  ?metoprolol succinate (TOPROL-XL) 25 MG 24 hr tablet Take 25 mg by mouth daily. 08/03/20  Yes [provider]  ?Multiple Vitamin (MULTIVITAMIN WITH MINERALS) TABS tablet Take 1 tablet by mouth daily.   Yes [provider]  ?oxybutynin (DITROPAN) 5 MG tablet TAKE 1 TABLET BY MOUTH TWICE A DAY 05/10/21  Yes Wendie Agreste, MD  ?pravastatin (PRAVACHOL) 20 MG tablet Take 1 tablet (20 mg total) by mouth every evening. 10/31/20  Yes Wendie Agreste, MD  ?telmisartan-hydrochlorothiazide (MICARDIS HCT) 80-25 MG tablet Take 1 tablet by mouth daily. 05/24/21  Yes Wendie Agreste, MD  ? ?Social History  ? ?Socioeconomic History  ? Marital status: Married  ?  Spouse name: Not on file  ? Number of children: 1  ? Years of education: 12th grade +  ? Highest education level: Not on file  ?Occupational History  ? Occupation: Beautician  ?Tobacco Use  ? Smoking status: Never  ? Smokeless tobacco: Never  ?Vaping Use  ? Vaping Use: Never used  ?Substance and Sexual Activity  ? Alcohol use: No  ?  Alcohol/week: 0.0 standard drinks  ? Drug use: No  ? Sexual activity: Not Currently  ?   Partners: Male  ?  Birth control/protection: Post-menopausal  ?  Comment: first intercourse >9 y.o  ?Other Topics Concern  ? Not on file  ?Social History Narrative  ? Lives with her husband.  ? Adult daughter lives nearby.  ? ?Social Determinants of Health  ? ?Financial Resource Strain: Low Risk   ? Difficulty of Paying Living Expenses: Not hard at all  ?Food Insecurity: No Food Insecurity  ? Worried About Charity fundraiser in the Last Year: Never true  ? Ran Out of Food in the Last Year: Never true  ?Transportation Needs: No Transportation Needs  ? Lack of Transportation (Medical): No  ? Lack of Transportation (Non-Medical): No  ?Physical Activity: Insufficiently Active  ? Days of Exercise per Week: 4 days  ? Minutes of Exercise per Session: 30 min  ?Stress: No Stress Concern  Present  ? Feeling of Stress : Not at all  ?Social Connections: Socially Integrated  ? Frequency of Communication with Friends and Family: More than three times a week  ? Frequency of Social Gatherings with Friends and Family: More than three times a week  ? Attends Religious Services: More than 4 times per year  ? Active Member of Clubs or Organizations: Yes  ? Attends Archivist Meetings: 1 to 4 times per year  ? Marital Status: Married  ?Intimate Partner Violence: Not At Risk  ? Fear of Current or Ex-Partner: No  ? Emotionally Abused: No  ? Physically Abused: No  ? Sexually Abused: No  ? ? ?Review of Systems ?Per HPI ? ?Objective:  ? ?Vitals:  ? 10/18/21 1342  ?BP: 128/76  ?Pulse: 75  ?Resp: 17  ?Temp: 98.4 ?F (36.9 ?C)  ?TempSrc: Temporal  ?SpO2: 96%  ?Weight: 226 lb 6.4 oz (102.7 kg)  ?Height: '5\' 2"'$  (1.575 m)  ? ? ? ?Physical Exam ?Vitals reviewed.  ?Constitutional:   ?   General: She is not in acute distress. ?   Appearance: Normal appearance. She is well-developed.  ?HENT:  ?   Head: Normocephalic and atraumatic.  ?Cardiovascular:  ?   Rate and Rhythm: Normal rate.  ?Pulmonary:  ?   Effort: Pulmonary effort is normal.   ?Abdominal:  ?   General: Abdomen is flat. Bowel sounds are normal. There is no distension.  ?   Tenderness: There is no abdominal tenderness. There is no right CVA tenderness, left CVA tenderness, guarding or rebound.  ?

## 2021-10-19 LAB — URINE CULTURE
MICRO NUMBER:: 13377623
SPECIMEN QUALITY:: ADEQUATE

## 2021-10-30 ENCOUNTER — Other Ambulatory Visit: Payer: Self-pay | Admitting: Family Medicine

## 2021-10-30 DIAGNOSIS — I10 Essential (primary) hypertension: Secondary | ICD-10-CM | POA: Diagnosis not present

## 2021-10-30 DIAGNOSIS — E785 Hyperlipidemia, unspecified: Secondary | ICD-10-CM | POA: Diagnosis not present

## 2021-10-30 DIAGNOSIS — N3941 Urge incontinence: Secondary | ICD-10-CM

## 2021-10-30 DIAGNOSIS — R7303 Prediabetes: Secondary | ICD-10-CM | POA: Diagnosis not present

## 2021-10-30 DIAGNOSIS — D649 Anemia, unspecified: Secondary | ICD-10-CM | POA: Diagnosis not present

## 2021-11-22 ENCOUNTER — Ambulatory Visit: Payer: Medicare HMO | Admitting: Family Medicine

## 2021-11-22 ENCOUNTER — Other Ambulatory Visit: Payer: Self-pay | Admitting: Family Medicine

## 2021-11-22 DIAGNOSIS — I10 Essential (primary) hypertension: Secondary | ICD-10-CM

## 2021-12-24 ENCOUNTER — Other Ambulatory Visit: Payer: Self-pay | Admitting: Family Medicine

## 2022-01-05 ENCOUNTER — Other Ambulatory Visit: Payer: Self-pay | Admitting: Family Medicine

## 2022-01-05 ENCOUNTER — Other Ambulatory Visit: Payer: Self-pay | Admitting: Obstetrics and Gynecology

## 2022-01-05 DIAGNOSIS — Z1231 Encounter for screening mammogram for malignant neoplasm of breast: Secondary | ICD-10-CM

## 2022-01-13 ENCOUNTER — Other Ambulatory Visit: Payer: Self-pay | Admitting: Family Medicine

## 2022-01-13 DIAGNOSIS — N3941 Urge incontinence: Secondary | ICD-10-CM

## 2022-01-20 ENCOUNTER — Other Ambulatory Visit: Payer: Self-pay | Admitting: Family Medicine

## 2022-01-20 DIAGNOSIS — E7800 Pure hypercholesterolemia, unspecified: Secondary | ICD-10-CM

## 2022-01-23 ENCOUNTER — Other Ambulatory Visit (HOSPITAL_COMMUNITY)
Admission: RE | Admit: 2022-01-23 | Discharge: 2022-01-23 | Disposition: A | Discharge status: Home / Self Care | Payer: Medicare HMO | Source: Ambulatory Visit | Attending: Cardiology | Admitting: Cardiology

## 2022-01-23 DIAGNOSIS — R7303 Prediabetes: Secondary | ICD-10-CM | POA: Diagnosis not present

## 2022-01-23 DIAGNOSIS — I10 Essential (primary) hypertension: Secondary | ICD-10-CM | POA: Diagnosis not present

## 2022-01-23 DIAGNOSIS — E785 Hyperlipidemia, unspecified: Secondary | ICD-10-CM | POA: Insufficient documentation

## 2022-01-23 LAB — BASIC METABOLIC PANEL
Anion gap: 6 (ref 5–15)
BUN: 13 mg/dL (ref 8–23)
CO2: 28 mmol/L (ref 22–32)
Calcium: 9.3 mg/dL (ref 8.9–10.3)
Chloride: 104 mmol/L (ref 98–111)
Creatinine, Ser: 0.73 mg/dL (ref 0.44–1.00)
GFR, Estimated: >60 mL/min (ref >60)
Glucose, Bld: 105 mg/dL — ABNORMAL HIGH (ref 70–99)
Potassium: 3.6 mmol/L (ref 3.5–5.1)
Sodium: 138 mmol/L (ref 135–145)

## 2022-01-23 LAB — HEPATIC FUNCTION PANEL
ALT: 20 U/L (ref 0–44)
AST: 21 U/L (ref 15–41)
Albumin: 3.7 g/dL (ref 3.5–5.0)
Alkaline Phosphatase: 54 U/L (ref 38–126)
Bilirubin, Direct: 0.2 mg/dL (ref 0.0–0.2)
Indirect Bilirubin: 0.6 mg/dL (ref 0.3–0.9)
Total Bilirubin: 0.8 mg/dL (ref 0.3–1.2)
Total Protein: 7.2 g/dL (ref 6.5–8.1)

## 2022-01-23 LAB — LIPID PANEL
Cholesterol: 162 mg/dL (ref 0–200)
HDL: 57 mg/dL (ref >40)
LDL Cholesterol: 91 mg/dL (ref 0–99)
Total CHOL/HDL Ratio: 2.8 RATIO
Triglycerides: 69 mg/dL (ref <150)
VLDL: 14 mg/dL (ref 0–40)

## 2022-01-23 LAB — HEMOGLOBIN A1C
Hgb A1c MFr Bld: 5.7 % — ABNORMAL HIGH (ref 4.8–5.6)
Mean Plasma Glucose: 116.89 mg/dL

## 2022-01-29 DIAGNOSIS — E785 Hyperlipidemia, unspecified: Secondary | ICD-10-CM | POA: Diagnosis not present

## 2022-01-29 DIAGNOSIS — R7303 Prediabetes: Secondary | ICD-10-CM | POA: Diagnosis not present

## 2022-01-29 DIAGNOSIS — I10 Essential (primary) hypertension: Secondary | ICD-10-CM | POA: Diagnosis not present

## 2022-01-31 ENCOUNTER — Ambulatory Visit
Admission: RE | Admit: 2022-01-31 | Discharge: 2022-01-31 | Disposition: A | Discharge status: Home / Self Care | Payer: Medicare HMO | Source: Ambulatory Visit | Attending: Obstetrics and Gynecology | Admitting: Obstetrics and Gynecology

## 2022-01-31 DIAGNOSIS — Z1231 Encounter for screening mammogram for malignant neoplasm of breast: Secondary | ICD-10-CM | POA: Diagnosis not present

## 2022-03-15 ENCOUNTER — Encounter: Payer: Self-pay | Admitting: Family Medicine

## 2022-03-15 ENCOUNTER — Ambulatory Visit (INDEPENDENT_AMBULATORY_CARE_PROVIDER_SITE_OTHER): Payer: Medicare HMO | Admitting: Family Medicine

## 2022-03-15 VITALS — BP 130/72 | HR 71 | Temp 98.0°F | Ht 62.0 in | Wt 228.2 lb

## 2022-03-15 DIAGNOSIS — I10 Essential (primary) hypertension: Secondary | ICD-10-CM | POA: Diagnosis not present

## 2022-03-15 DIAGNOSIS — E7800 Pure hypercholesterolemia, unspecified: Secondary | ICD-10-CM

## 2022-03-15 DIAGNOSIS — R351 Nocturia: Secondary | ICD-10-CM | POA: Diagnosis not present

## 2022-03-15 LAB — COMPREHENSIVE METABOLIC PANEL
ALT: 20 U/L (ref 0–35)
AST: 24 U/L (ref 0–37)
Albumin: 4.4 g/dL (ref 3.5–5.2)
Alkaline Phosphatase: 60 U/L (ref 39–117)
BUN: 13 mg/dL (ref 6–23)
CO2: 33 mEq/L — ABNORMAL HIGH (ref 19–32)
Calcium: 10.1 mg/dL (ref 8.4–10.5)
Chloride: 99 mEq/L (ref 96–112)
Creatinine, Ser: 0.75 mg/dL (ref 0.40–1.20)
GFR: 81.1 mL/min (ref >60.00)
Glucose, Bld: 102 mg/dL — ABNORMAL HIGH (ref 70–99)
Potassium: 3.7 mEq/L (ref 3.5–5.1)
Sodium: 137 mEq/L (ref 135–145)
Total Bilirubin: 0.7 mg/dL (ref 0.2–1.2)
Total Protein: 8.1 g/dL (ref 6.0–8.3)

## 2022-03-15 LAB — LIPID PANEL
Cholesterol: 159 mg/dL (ref 0–200)
HDL: 64.5 mg/dL (ref >39.00)
LDL Cholesterol: 70 mg/dL (ref 0–99)
NonHDL: 94.73
Total CHOL/HDL Ratio: 2
Triglycerides: 124 mg/dL (ref 0.0–149.0)
VLDL: 24.8 mg/dL (ref 0.0–40.0)

## 2022-03-15 MED ORDER — TELMISARTAN-HCTZ 80-25 MG PO TABS: 1.0000 | ORAL_TABLET | Freq: Every day | ORAL | 1 refills | Status: DC

## 2022-03-15 MED ORDER — FAMOTIDINE 20 MG PO TABS: 20.0000 mg | ORAL_TABLET | Freq: Two times a day (BID) | ORAL | 1 refills | Status: DC

## 2022-03-15 MED ORDER — POTASSIUM CHLORIDE CRYS ER 20 MEQ PO TBCR: 20.0000 meq | EXTENDED_RELEASE_TABLET | Freq: Two times a day (BID) | ORAL | 1 refills | Status: DC

## 2022-03-15 MED ORDER — AMLODIPINE BESYLATE 2.5 MG PO TABS: 2.5000 mg | ORAL_TABLET | Freq: Every day | ORAL | 1 refills | Status: DC

## 2022-03-15 NOTE — Progress Notes (Signed)
Subjective:  Patient ID: Angela Estrada, female    DOB: 08-19-1950  Age: 71 y.o. MRN: [id number]  CC:  Chief Complaint  Patient presents with   Nocturia   Hypertension   Hyperlipidemia    HPI [name] presents for   Hypertension: Micardis HCT 80/25 mg daily, potassium 20 mEq daily, Norvasc 2.5 mg daily.  Toprol-XL 25 mg daily. No new side effects.  Home readings: none.  BP Readings from Last 3 Encounters:  03/15/22 130/72  10/18/21 128/76  06/19/21 130/66   Lab Results  Component Value Date   CREATININE 0.73 01/23/2022    Hyperlipidemia: Pravastatin 20 mg nightly.no myalgias/side effects.  Not fasting - ate few hrs ago.  Lab Results  Component Value Date   CHOL 162 01/23/2022   HDL 57 01/23/2022   LDLCALC 91 01/23/2022   TRIG 69 01/23/2022   CHOLHDL 2.8 01/23/2022   Lab Results  Component Value Date   ALT 20 01/23/2022   AST 21 01/23/2022   ALKPHOS 54 01/23/2022   BILITOT 0.8 01/23/2022   Nocturia with urge incontinence Worsening of symptoms in May.  Increased oxybutynin to 5 mg 3 times daily.  Potential side effects discussed.  Option of Myrbetriq if needed.  Mixed flora on urine culture at that time. Using oxybutynin BID usually, occasionally 3rd dose if needed. Doing better.  No lightheadedness or other side effects at higher dosing.   Prediabetes: No meds for blood sugar. Recent A1c borderline. Improved from prior readings.  Lab Results  Component Value Date   HGBA1C 5.7 (H) 01/23/2022   Wt Readings from Last 3 Encounters:  03/15/22 228 lb 3.2 oz (103.5 kg)  10/18/21 226 lb 6.4 oz (102.7 kg)  06/19/21 224 lb (101.6 kg)   HM: Plan for high dose flu, covid vaccine at pharmacy.    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   Failed total hip arthroplasty  ([place]) 10/01/2017   Arthritis of right hip 03/19/2016   Primary osteoarthritis of right hip 03/18/2016   Hypokalemia 03/29/2014   Arthralgia 02/11/2014   Routine general medical examination at a health care facility 03/28/2013   Nonspecific abnormal findings on radiological and examination of lung field 03/27/2013   Cough 03/11/2013   Unspecified disorder of liver 03/09/2013   Pyuria 03/09/2013   Arthritis    Ventral hernia 01/18/2012   Encounter for long-term (current) use of other medications 07/05/2011   HYPERGLYCEMIA 08/12/2008   HYPERCHOLESTEROLEMIA 04/25/2007   DIZZINESS AND GIDDINESS 04/25/2007   Essential hypertension 01/03/2007   ALLERGIC RHINITIS 01/03/2007   Past Medical History:  Diagnosis Date   Abdominal hernia    RLQ   Allergy    Arthritis    Arthritis    Borderline diabetes    GERD (gastroesophageal reflux disease)    HYPERCHOLESTEROLEMIA 04/25/2007   takes Pravastatin daily   HYPERTENSION 01/03/2007   takes Losartan-HCTZ  and Amlodipine daily   Hypokalemia    takes Potassium daily   Joint pain    Nocturia    Pneumonia 2 yrs ago   Primary osteoarthritis of shoulder    Right   Past Surgical History:  Procedure Laterality Date   CESAREAN SECTION  25 years ago   CHOLECYSTECTOMY     COLONOSCOPY     DILATION AND CURETTAGE OF UTERUS  35  years ago   [place]  02/21/2012   Procedure: LAPAROSCOPIC INCISIONAL HERNIA;  Surgeon: Harl Bowie, MD;  Location: [facility];  Service: General;  Laterality: N/A;  laparoscopic incisional hernia repair with mesh   JOINT REPLACEMENT     bilateral knee replacements   REPLACEMENT TOTAL KNEE BILATERAL  2010   TOTAL HIP ARTHROPLASTY Right 03/19/2016   Procedure: RIGHT TOTAL HIP ARTHROPLASTY ANTERIOR APPROACH;  Surgeon: Frederik Pear, MD;  Location: [facility];  Service: Orthopedics;  Laterality: Right;   TOTAL HIP REVISION Right 10/02/2017   Procedure: TOTAL HIP REVISION POSTERIOR;  Surgeon: Frederik Pear, MD;  Location: [facility];  Service: Orthopedics;  Laterality: Right;   TOTAL HIP REVISION Right 05/23/2020   Procedure: RIGHT TOTAL POSTERIOR HIP REVISION;  Surgeon: Frederik Pear, MD;  Location: [facility];  Service: Orthopedics;  Laterality: Right;   TOTAL SHOULDER ARTHROPLASTY Right 12/19/2017   Procedure: RIGHT TOTAL SHOULDER ARTHROPLASTY;  Surgeon: Tania Ade, MD;  Location[facility];  Service: Orthopedics;  Laterality: Right;   TUBAL LIGATION     Allergies  Allergen Reactions   Lisinopril Cough   Oxycodone Other (See Comments)    Hallucinations   Prior to Admission medications   Medication Sig Start Date End Date Taking? Authorizing Provider  amLODipine (NORVASC) 2.5 MG tablet TAKE 1 TABLET BY MOUTH EVERY DAY 11/22/21  Yes Wendie Agreste, MD  Apoaequorin (PREVAGEN) 10 MG CAPS Take 10 mg by mouth daily.   Yes [provider]  aspirin EC 81 MG tablet Take 1 tablet (81 mg total) by mouth 2 (two) times daily. 05/23/20  Yes Leighton Parody, PA-C  Carboxymethylcellul-Glycerin (LUBRICATING EYE DROPS OP) Place 1 drop into both eyes daily as needed (dry eyes).   Yes [provider]  cetirizine-pseudoephedrine (ZYRTEC-D) 5-120 MG tablet Take 1 tablet by mouth daily as needed for allergies.   Yes [provider]  famotidine (PEPCID) 20 MG tablet TAKE 1 TABLET BY MOUTH TWICE A DAY 12/24/21  Yes Wendie Agreste, MD  KLOR-CON M20 20 MEQ tablet TAKE 1 TABLET BY MOUTH TWICE A DAY 01/05/22  Yes Wendie Agreste, MD  metoprolol succinate (TOPROL-XL) 25 MG 24 hr tablet Take 25 mg by mouth daily. 08/03/20  Yes [provider]  Multiple Vitamin (MULTIVITAMIN WITH MINERALS) TABS tablet Take 1 tablet by mouth daily.   Yes [provider]  oxybutynin (DITROPAN) 5 MG tablet TAKE 1 TABLET BY MOUTH THREE TIMES A DAY 01/15/22  Yes Wendie Agreste, MD  pravastatin (PRAVACHOL) 20 MG tablet TAKE 1 TABLET BY MOUTH EVERY DAY IN THE EVENING 01/22/22  Yes Wendie Agreste, MD  telmisartan-hydrochlorothiazide (MICARDIS HCT) 80-25 MG tablet TAKE 1 TABLET BY MOUTH EVERY DAY 11/22/21  Yes Wendie Agreste, MD   Social History   Socioeconomic History   Marital status: Married    Spouse name: Not on file   Number of children: 1   Years of education: 12th grade +   Highest education level: Not on file  Occupational History   Occupation: Beautician  Tobacco Use   Smoking status: Never   Smokeless tobacco: Never  Vaping Use   Vaping Use: Never used  Substance and Sexual Activity   Alcohol use: No    Alcohol/week: 0.0 standard drinks of alcohol   Drug use: No   Sexual activity: Not Currently    Partners: Male    Birth control/protection: Post-menopausal  Comment: first intercourse >25 y.o  Other Topics Concern   Not on file  Social History Narrative   Lives with her husband.   Adult daughter lives nearby.   Social Determinants of Health   Financial Resource Strain: Low Risk  (03/27/2021)   Overall Financial Resource Strain (CARDIA)    Difficulty of Paying Living Expenses: Not hard at all  Food Insecurity: No Food Insecurity (03/27/2021)   Hunger Vital Sign    Worried About Running Out of Food in the Last Year: Never true    Ran Out of Food in the Last Year: Never true  Transportation Needs: No Transportation Needs (03/27/2021)   PRAPARE - Hydrologist (Medical): No    Lack of Transportation (Non-Medical): No  Physical Activity: Insufficiently Active (03/27/2021)   Exercise Vital Sign    Days of Exercise per Week: 4 days    Minutes of Exercise per Session: 30 min  Stress: No Stress Concern Present (03/27/2021)   [place]    Feeling of Stress : Not at all  Social Connections: [place] (03/27/2021)   Social Connection and Isolation Panel [NHANES]    Frequency of Communication with Friends and Family: More than three times a week     Frequency of Social Gatherings with Friends and Family: More than three times a week    Attends Religious Services: More than 4 times per year    Active Member of [employer] or Organizations: Yes    Attends Archivist Meetings: 1 to 4 times per year    Marital Status: Married  Human resources officer Violence: Not At Risk (03/27/2021)   Humiliation, Afraid, Rape, and Kick questionnaire    Fear of Current or Ex-Partner: No    Emotionally Abused: No    Physically Abused: No    Sexually Abused: No    Review of Systems  Constitutional:  Negative for fatigue and unexpected weight change.  Respiratory:  Negative for chest tightness and shortness of breath.   Cardiovascular:  Negative for chest pain, palpitations and leg swelling.  Gastrointestinal:  Negative for abdominal pain and blood in stool.  Neurological:  Negative for dizziness, syncope, light-headedness and headaches.     Objective:   Vitals:   03/15/22 1032  BP: 130/72  Pulse: 71  Temp: 98 F (36.7 C)  SpO2: 98%  Weight: 228 lb 3.2 oz (103.5 kg)  Height: '5\' 2"'$  (1.575 m)     Physical Exam Vitals reviewed.  Constitutional:      Appearance: Normal appearance. She is well-developed.  HENT:     Head: Normocephalic and atraumatic.  Eyes:     Conjunctiva/sclera: Conjunctivae normal.     Pupils: Pupils are equal, round, and reactive to light.  Neck:     Vascular: No carotid bruit.  Cardiovascular:     Rate and Rhythm: Normal rate and regular rhythm.     Heart sounds: Normal heart sounds.  Pulmonary:     Effort: Pulmonary effort is normal.     Breath sounds: Normal breath sounds.  Abdominal:     Palpations: Abdomen is soft. There is no pulsatile mass.     Tenderness: There is no abdominal tenderness.  Musculoskeletal:     Right lower leg: No edema.     Left lower leg: No edema.  Skin:    General: Skin is warm and dry.  Neurological:     Mental Status: She is alert and oriented  to person, place, and time.   Psychiatric:        Mood and Affect: Mood normal.        Behavior: Behavior normal.        Assessment & Plan:  SHARAH FINNELL is a 71 y.o. female . Nocturia  -Stable with oxybutynin, continue lowest effective dose with potential side effects discussed.  Essential hypertension - Plan: amLODipine (NORVASC) 2.5 MG tablet, potassium chloride SA (KLOR-CON M20) 20 MEQ tablet, telmisartan-hydrochlorothiazide (MICARDIS HCT) 80-25 MG tablet  -Stable with current regimen, check labs, medication adjustments accordingly.  Pure hypercholesterolemia - Plan: Comprehensive metabolic panel, Lipid panel  -Tolerating pravastatin, continue same.  Discussed vaccines at pharmacy including flu, COVID booster and option of RSV vaccination.  Meds ordered this encounter  Medications   amLODipine (NORVASC) 2.5 MG tablet    Sig: Take 1 tablet (2.5 mg total) by mouth daily.    Dispense:  90 tablet    Refill:  1   famotidine (PEPCID) 20 MG tablet    Sig: Take 1 tablet (20 mg total) by mouth 2 (two) times daily.    Dispense:  180 tablet    Refill:  1   potassium chloride SA (KLOR-CON M20) 20 MEQ tablet    Sig: Take 1 tablet (20 mEq total) by mouth 2 (two) times daily.    Dispense:  180 tablet    Refill:  1   telmisartan-hydrochlorothiazide (MICARDIS HCT) 80-25 MG tablet    Sig: Take 1 tablet by mouth daily.    Dispense:  90 tablet    Refill:  1   Patient Instructions  Thanks for coming in today.   Unfortunately we do not have any high-dose flu vaccines at this time.  You can check back with [place] either tomorrow or early next week, or can have that given at your pharmacy.  COVID booster can also be given at your pharmacy as well as RSV vaccine if you would like.  Here is some information on that vaccine.  Let me know if there are questions.  No change in meds for now.  I will let you know if there are concerns on your  labs.  [email or web address]     Signed,   Merri Ray, MD [place] 03/15/22 11:21 AM

## 2022-03-15 NOTE — Patient Instructions (Addendum)
Thanks for coming in today.   Unfortunately we do not have any high-dose flu vaccines at this time.  You can check back with [place] either tomorrow or early next week, or can have that given at your pharmacy.  COVID booster can also be given at your pharmacy as well as RSV vaccine if you would like.  Here is some information on that vaccine.  Let me know if there are questions.  No change in meds for now.  I will let you know if there are concerns on your labs.  [email or web address]

## 2022-03-21 ENCOUNTER — Ambulatory Visit (INDEPENDENT_AMBULATORY_CARE_PROVIDER_SITE_OTHER): Payer: Medicare HMO | Admitting: Family Medicine

## 2022-03-21 DIAGNOSIS — Z23 Encounter for immunization: Secondary | ICD-10-CM

## 2022-03-21 NOTE — Progress Notes (Signed)
Pt received her flu vaccine today in left deltoid . Tolerated injection well

## 2022-03-22 DIAGNOSIS — H524 Presbyopia: Secondary | ICD-10-CM | POA: Diagnosis not present

## 2022-03-22 DIAGNOSIS — H2513 Age-related nuclear cataract, bilateral: Secondary | ICD-10-CM | POA: Diagnosis not present

## 2022-03-22 DIAGNOSIS — H5203 Hypermetropia, bilateral: Secondary | ICD-10-CM | POA: Diagnosis not present

## 2022-03-22 DIAGNOSIS — H52202 Unspecified astigmatism, left eye: Secondary | ICD-10-CM | POA: Diagnosis not present

## 2022-03-29 ENCOUNTER — Ambulatory Visit (INDEPENDENT_AMBULATORY_CARE_PROVIDER_SITE_OTHER): Payer: Medicare HMO

## 2022-03-29 VITALS — Ht 62.0 in | Wt 228.0 lb

## 2022-03-29 DIAGNOSIS — Z0000 Encounter for general adult medical examination without abnormal findings: Secondary | ICD-10-CM | POA: Diagnosis not present

## 2022-03-29 NOTE — Progress Notes (Signed)
Subjective:   Angela Estrada is a 71 y.o. female who presents for Medicare Annual (Subsequent) preventive examination.   I connected with  [name] on 03/29/22 by a audio enabled telemedicine application and verified that I am speaking with the correct person using two identifiers.  Patient Location: Home  Provider Location: Home Office  I discussed the limitations of evaluation and management by telemedicine. The patient expressed understanding and agreed to proceed.  Review of Systems     Cardiac Risk Factors include: advanced age (>87mn, >>20women);hypertension     Objective:    Today's Vitals   03/29/22 1306  Weight: 228 lb (103.4 kg)  Height: '5\' 2"'$  (1.575 m)   Body mass index is 41.7 kg/m.     03/27/2021   12:47 PM 07/27/2020    7:23 PM 06/15/2020   11:29 AM 05/23/2020    3:46 PM 05/19/2020   11:14 AM 02/10/2020   11:04 AM 02/03/2019    2:02 PM  Advanced Directives  Does Patient Have a Medical Advance Directive? No No Yes Yes Yes No No  Type of APersonnel officerof ABuenaLiving will [place] will    Does patient want to make changes to medical advance directive?   No - Patient declined No - Patient declined     Copy of [place] Chart?   No - copy requested No - copy requested     Would patient like information on creating a medical advance directive? No - Patient declined No - Patient declined    No - Patient declined No - Patient declined    Current Medications (verified) Outpatient Encounter Medications as of 03/29/2022  Medication Sig   amLODipine (NORVASC) 2.5 MG tablet Take 1 tablet (2.5 mg total) by mouth daily.   Apoaequorin (PREVAGEN) 10 MG CAPS Take 10 mg by mouth daily.   aspirin EC 81 MG tablet Take 1 tablet (81 mg total) by mouth 2 (two) times daily.   Carboxymethylcellul-Glycerin (LUBRICATING EYE DROPS OP) Place 1 drop into both eyes  daily as needed (dry eyes).   cetirizine-pseudoephedrine (ZYRTEC-D) 5-120 MG tablet Take 1 tablet by mouth daily as needed for allergies.   famotidine (PEPCID) 20 MG tablet Take 1 tablet (20 mg total) by mouth 2 (two) times daily.   metoprolol succinate (TOPROL-XL) 25 MG 24 hr tablet Take 25 mg by mouth daily.   Multiple Vitamin (MULTIVITAMIN WITH MINERALS) TABS tablet Take 1 tablet by mouth daily.   oxybutynin (DITROPAN) 5 MG tablet TAKE 1 TABLET BY MOUTH THREE TIMES A DAY   potassium chloride SA (KLOR-CON M20) 20 MEQ tablet Take 1 tablet (20 mEq total) by mouth 2 (two) times daily.   pravastatin (PRAVACHOL) 20 MG tablet TAKE 1 TABLET BY MOUTH EVERY DAY IN THE EVENING   telmisartan-hydrochlorothiazide (MICARDIS HCT) 80-25 MG tablet Take 1 tablet by mouth daily.   No facility-administered encounter medications on file as of 03/29/2022.    Allergies (verified) Lisinopril and Oxycodone   History: Past Medical History:  Diagnosis Date   Abdominal hernia    RLQ   Allergy    Arthritis    Arthritis    Borderline diabetes    GERD (gastroesophageal reflux disease)    HYPERCHOLESTEROLEMIA 04/25/2007   takes Pravastatin daily   HYPERTENSION 01/03/2007   takes Losartan-HCTZ  and Amlodipine daily   Hypokalemia    takes Potassium daily   Joint pain  Nocturia    Pneumonia 2 yrs ago   Primary osteoarthritis of shoulder    Right   Past Surgical History:  Procedure Laterality Date   CESAREAN SECTION  25 years ago   CHOLECYSTECTOMY     COLONOSCOPY     DILATION AND CURETTAGE OF UTERUS  35 years ago   [place]  02/21/2012   Procedure: LAPAROSCOPIC INCISIONAL HERNIA;  Surgeon: Harl Bowie, MD;  Location: [facility];  Service: General;  Laterality: N/A;  laparoscopic incisional hernia repair with mesh   JOINT REPLACEMENT     bilateral knee replacements   REPLACEMENT TOTAL KNEE BILATERAL  2010   TOTAL HIP ARTHROPLASTY Right 03/19/2016   Procedure: RIGHT  TOTAL HIP ARTHROPLASTY ANTERIOR APPROACH;  Surgeon: Frederik Pear, MD;  Location: [facility];  Service: Orthopedics;  Laterality: Right;   TOTAL HIP REVISION Right 10/02/2017   Procedure: TOTAL HIP REVISION POSTERIOR;  Surgeon: Frederik Pear, MD;  Location: [facility];  Service: Orthopedics;  Laterality: Right;   TOTAL HIP REVISION Right 05/23/2020   Procedure: RIGHT TOTAL POSTERIOR HIP REVISION;  Surgeon: Frederik Pear, MD;  Location: [facility];  Service: Orthopedics;  Laterality: Right;   TOTAL SHOULDER ARTHROPLASTY Right 12/19/2017   Procedure: RIGHT TOTAL SHOULDER ARTHROPLASTY;  Surgeon: Tania Ade, MD;  Location: [facility];  Service: Orthopedics;  Laterality: Right;   TUBAL LIGATION     Family History  Problem Relation Age of Onset   Diabetes Mother    Heart disease Mother    Hypertension Mother    Stroke Mother    Diabetes Father    Heart disease Father        Triple Bypass   Hypertension Father    Heart attack Brother    Congestive Heart Failure Brother    Hypertension Brother    Heart disease Brother    Hypertension Sister    Hypertension Sister    Cancer Sister        uterine   Hypertension Sister    Hypertension Sister    Hypertension Sister    Hypertension Sister    Cancer Sister        uterine   Colon cancer Neg Hx    Social History   Socioeconomic History   Marital status: Married    Spouse name: Not on file   Number of children: 1   Years of education: 12th grade +   Highest education level: Not on file  Occupational History   Occupation: Beautician  Tobacco Use   Smoking status: Never   Smokeless tobacco: Never  Vaping Use   Vaping Use: Never used  Substance and Sexual Activity   Alcohol use: No    Alcohol/week: 0.0 standard drinks of alcohol   Drug use: No   Sexual activity: Not Currently    Partners: Male    Birth control/protection: Post-menopausal    Comment: first intercourse >62 y.o  Other Topics Concern   Not on file  Social History Narrative   Lives  with her husband.   Adult daughter lives nearby.   Social Determinants of Health   Financial Resource Strain: Low Risk  (03/29/2022)   Overall Financial Resource Strain (CARDIA)    Difficulty of Paying Living Expenses: Not hard at all  Food Insecurity: No Food Insecurity (03/29/2022)   Hunger Vital Sign    Worried About Running Out of Food in the Last Year: Never true    Ran Out of Food in the  Last Year: Never true  Transportation Needs: No Transportation Needs (03/27/2021)   PRAPARE - Hydrologist (Medical): No    Lack of Transportation (Non-Medical): No  Physical Activity: Insufficiently Active (03/29/2022)   Exercise Vital Sign    Days of Exercise per Week: 4 days    Minutes of Exercise per Session: 30 min  Stress: No Stress Concern Present (03/29/2022)   [place]    Feeling of Stress : Not at all  Social Connections: Moderately Integrated (03/29/2022)   Social Connection and Isolation Panel [NHANES]    Frequency of Communication with Friends and Family: More than three times a week    Frequency of Social Gatherings with Friends and Family: More than three times a week    Attends Religious Services: More than 4 times per year    Active Member of [employer] or Organizations: No    Attends Music therapist: Never    Marital Status: Married    Tobacco Counseling Counseling given: Not Answered   Clinical Intake:  Pre-visit preparation completed: Yes  Pain : No/denies pain     Nutritional Risks: None Diabetes: No  How often do you need to have someone help you when you read instructions, pamphlets, or other written materials from your doctor or pharmacy?: 1 - Never  Diabetic?no   Interpreter Needed?: No  Information entered by :: Jadene Pierini, LPN   Activities of Daily Living    03/29/2022    1:10 PM  In your present state of health, do you have any  difficulty performing the following activities:  Hearing? 0  Vision? 0  Difficulty concentrating or making decisions? 0  Walking or climbing stairs? 0  Dressing or bathing? 0  Doing errands, shopping? 0  Preparing Food and eating ? N  Using the Toilet? N  In the past six months, have you accidently leaked urine? N  Do you have problems with loss of bowel control? N  Managing your Medications? N  Managing your Finances? N  Housekeeping or managing your Housekeeping? N    Patient Care Team: Wendie Agreste, MD as PCP - General (Family Medicine) Nunzio Cobbs, MD as Attending Physician (Obstetrics and Gynecology) Tania Ade, MD as Consulting Physician (Orthopedic Surgery) Frederik Pear, MD as Consulting Physician (Orthopedic Surgery)  Indicate any recent Medical Services you may have received from other than [facility] providers in the past year (date may be approximate).     Assessment:   This is a routine wellness examination for [place].  Hearing/Vision screen Vision Screening - Comments:: Annual ey exams wear s glasses   Dietary issues and exercise activities discussed: Current Exercise Habits: Home exercise routine, Type of exercise: walking, Time (Minutes): 30, Frequency (Times/Week): 4, Weekly Exercise (Minutes/Week): 120, Intensity: Mild, Exercise limited by: None identified   Goals Addressed             This Visit's Progress    Patient Stated   On track    Loose some weight.       Depression Screen    03/29/2022    1:09 PM 03/15/2022   10:32 AM 10/18/2021    1:46 PM 05/24/2021   11:45 AM 03/27/2021    1:05 PM 10/31/2020    4:21 PM 08/12/2020   10:31 AM  PHQ 2/9 Scores  PHQ - 2 Score 0 0 0 0 0 0 0  PHQ- 9 Score 0 0  0     Fall Risk    03/29/2022    1:07 PM 03/15/2022   10:30 AM 10/18/2021    1:46 PM 05/24/2021   11:45 AM 10/31/2020    3:32 PM  [place] in the past year? 0 0 0 0 0  Number falls in past yr: 0 0 0 0   Injury  with Fall? 0 0 0 0   Risk for fall due to : No Fall Risks No Fall Risks No Fall Risks No Fall Risks   Follow up Falls prevention discussed Falls evaluation completed Falls evaluation completed Falls evaluation completed Falls evaluation completed    [place]:  Any stairs in or around the home? Yes  If so, are there any without handrails? No  Home free of loose throw rugs in walkways, pet beds, electrical cords, etc? Yes  Adequate lighting in your home to reduce risk of falls? Yes   ASSISTIVE DEVICES UTILIZED TO PREVENT FALLS:  Life alert? No  Use of a cane, walker or w/c? Yes  Grab bars in the bathroom? Yes  Shower chair or bench in shower? Yes  Elevated toilet seat or a handicapped toilet? Yes          03/29/2022    1:10 PM 02/10/2020   11:04 AM 02/03/2019    2:03 PM  6CIT Screen  What Year? 0 points 0 points 0 points  What month? 0 points 0 points 0 points  What time? 0 points 0 points 0 points  Count back from 20 0 points 0 points 0 points  Months in reverse 0 points 0 points 0 points  Repeat phrase 0 points 0 points 0 points  Total Score 0 points 0 points 0 points    Immunizations Immunization History  Administered Date(s) Administered   Fluad Quad(high Dose 65+) 02/10/2019, 03/14/2020, 05/24/2021   Influenza Split 05/20/2012   Influenza, High Dose Seasonal PF 03/13/2018, 03/21/2022   Influenza,inj,Quad PF,6+ Mos 02/11/2014, 03/01/2015, 03/21/2016, 04/02/2017   PFIZER(Purple Top)SARS-COV-2 Vaccination 08/04/2019, 08/25/2019, 05/14/2020   Pneumococcal Conjugate-13 11/01/2017   Pneumococcal Polysaccharide-23 03/21/2016, 05/24/2021   Td 08/04/2001   Tdap 10/02/2013   Zoster Recombinat (Shingrix) 01/21/2019, 09/17/2019   Zoster, Live 03/29/2014    TDAP status: Up to date  Flu Vaccine status: Up to date  Pneumococcal vaccine status: Up to date  Covid-19 vaccine status: Completed vaccines  Qualifies for Shingles Vaccine? Yes    Zostavax completed Yes   Shingrix Completed?: Yes  Screening Tests Health Maintenance  Topic Date Due   COVID-19 Vaccine (4 - Pfizer risk series) 03/31/2022 (Originally 07/09/2020)   MAMMOGRAM  02/01/2023   TETANUS/TDAP  10/03/2023   COLONOSCOPY (Pts 45-17yr Insurance coverage will need to be confirmed)  03/21/2027   Pneumonia Vaccine 69 Years old  Completed   INFLUENZA VACCINE  Completed   DEXA SCAN  Completed   Hepatitis C Screening  Completed   Zoster Vaccines- Shingrix  Completed   HPV VACCINES  Aged Out    Health Maintenance  There are no preventive care reminders to display for this patient.  Colorectal cancer screening: Type of screening: Colonoscopy. Completed 03/20/17. Repeat every 10 years  Mammogram status: Completed 01/31/2022. Repeat every year  Bone Density status: Completed 02/02/2019. Results reflect: Bone density results: OSTEOPENIA. Repeat every 5 years.  Lung Cancer Screening: (Low Dose CT Chest recommended if Age 69-80years, 30 pack-year currently smoking OR have quit w/in 15years.) does not qualify.  Lung Cancer Screening Referral: n/a  Additional Screening:  Hepatitis C Screening: does not qualify; Completed   Vision Screening: Recommended annual ophthalmology exams for early detection of glaucoma and other disorders of the eye. Is the patient up to date with their annual eye exam?  Yes  Who is the provider or what is the name of the office in which the patient attends annual eye exams? Dr. Corrin Parker  If pt is not established with a provider, would they like to be referred to a provider to establish care? No .   Dental Screening: Recommended annual dental exams for proper oral hygiene  Community Resource Referral / Chronic Care Management: CRR required this visit?  No   CCM required this visit?  No      Plan:     I have personally reviewed and noted the following in the patient's chart:   Medical and social history Use of alcohol,  tobacco or illicit drugs  Current medications and supplements including opioid prescriptions. Patient is not currently taking opioid prescriptions. Functional ability and status Nutritional status Physical activity Advanced directives List of other physicians Hospitalizations, surgeries, and ER visits in previous 12 months Vitals Screenings to include cognitive, depression, and falls Referrals and appointments  In addition, I have reviewed and discussed with patient certain preventive protocols, quality metrics, and best practice recommendations. A written personalized care plan for preventive services as well as general preventive health recommendations were provided to patient.     Daphane Shepherd, LPN   30/16/0109   Nurse Notes: none

## 2022-03-29 NOTE — Patient Instructions (Signed)
Angela Estrada , Thank you for taking time to come for your Medicare Wellness Visit. I appreciate your ongoing commitment to your health goals. Please review the following plan we discussed and let me know if I can assist you in the future.   These are the goals we discussed:  Goals      Patient Stated     Loose some weight.     Patient Stated     Loose some wieght        This is a list of the screening recommended for you and due dates:  Health Maintenance  Topic Date Due   COVID-19 Vaccine (4 - Pfizer risk series) 03/31/2022*   Mammogram  02/01/2023   Tetanus Vaccine  10/03/2023   Colon Cancer Screening  03/21/2027   Pneumonia Vaccine  Completed   Flu Shot  Completed   DEXA scan (bone density measurement)  Completed   Hepatitis C Screening: USPSTF Recommendation to screen - Ages 26-79 yo.  Completed   Zoster (Shingles) Vaccine  Completed   HPV Vaccine  Aged Out  *Topic was postponed. The date shown is not the original due date.    Advanced directives: Advance directive discussed with you today. I have provided a copy for you to complete at home and have notarized. Once this is complete please bring a copy in to our office so we can scan it into your chart.   Conditions/risks identified: Aim for 30 minutes of exercise or brisk walking, 6-8 glasses of water, and 5 servings of fruits and vegetables each day.   Next appointment: Follow up in one year for your annual wellness visit    Preventive Care 65 Years and Older, Female Preventive care refers to lifestyle choices and visits with your health care provider that can promote health and wellness. What does preventive care include? A yearly physical exam. This is also called an annual well check. Dental exams once or twice a year. Routine eye exams. Ask your health care provider how often you should have your eyes checked. Personal lifestyle choices, including: Daily care of your teeth and gums. Regular physical  activity. Eating a healthy diet. Avoiding tobacco and drug use. Limiting alcohol use. Practicing safe sex. Taking low-dose aspirin every day. Taking vitamin and mineral supplements as recommended by your health care provider. What happens during an annual well check? The services and screenings done by your health care provider during your annual well check will depend on your age, overall health, lifestyle risk factors, and family history of disease. Counseling  Your health care provider may ask you questions about your: Alcohol use. Tobacco use. Drug use. Emotional well-being. Home and relationship well-being. Sexual activity. Eating habits. History of falls. Memory and ability to understand (cognition). Work and work Statistician. Reproductive health. Screening  You may have the following tests or measurements: Height, weight, and BMI. Blood pressure. Lipid and cholesterol levels. These may be checked every 5 years, or more frequently if you are over 16 years old. Skin check. Lung cancer screening. You may have this screening every year starting at age 72 if you have a 30-pack-year history of smoking and currently smoke or have quit within the past 15 years. Fecal occult blood test (FOBT) of the stool. You may have this test every year starting at age 62. Flexible sigmoidoscopy or colonoscopy. You may have a sigmoidoscopy every 5 years or a colonoscopy every 10 years starting at age 7. Hepatitis C blood test. Hepatitis B blood test.  Sexually transmitted disease (STD) testing. Diabetes screening. This is done by checking your blood sugar (glucose) after you have not eaten for a while (fasting). You may have this done every 1-3 years. Bone density scan. This is done to screen for osteoporosis. You may have this done starting at age 61. Mammogram. This may be done every 1-2 years. Talk to your health care provider about how often you should have regular mammograms. Talk with your  health care provider about your test results, treatment options, and if necessary, the need for more tests. Vaccines  Your health care provider may recommend certain vaccines, such as: Influenza vaccine. This is recommended every year. Tetanus, diphtheria, and acellular pertussis (Tdap, Td) vaccine. You may need a Td booster every 10 years. Zoster vaccine. You may need this after age 19. Pneumococcal 13-valent conjugate (PCV13) vaccine. One dose is recommended after age 60. Pneumococcal polysaccharide (PPSV23) vaccine. One dose is recommended after age 66. Talk to your health care provider about which screenings and vaccines you need and how often you need them. This information is not intended to replace advice given to you by your health care provider. Make sure you discuss any questions you have with your health care provider. Document Released: 06/24/2015 Document Revised: 02/15/2016 Document Reviewed: 03/29/2015 Elsevier Interactive Patient Education  2017 [place] Prevention in the Home Falls can cause injuries. They can happen to people of all ages. There are many things you can do to make your home safe and to help prevent falls. What can I do on the outside of my home? Regularly fix the edges of walkways and driveways and fix any cracks. Remove anything that might make you trip as you walk through a door, such as a raised step or threshold. Trim any bushes or trees on the path to your home. Use bright outdoor lighting. Clear any walking paths of anything that might make someone trip, such as rocks or tools. Regularly check to see if handrails are loose or broken. Make sure that both sides of any steps have handrails. Any raised decks and porches should have guardrails on the edges. Have any leaves, snow, or ice cleared regularly. Use sand or salt on walking paths during winter. Clean up any spills in your garage right away. This includes oil or grease spills. What can I  do in the bathroom? Use night lights. Install grab bars by the toilet and in the tub and shower. Do not use towel bars as grab bars. Use non-skid mats or decals in the tub or shower. If you need to sit down in the shower, use a plastic, non-slip stool. Keep the floor dry. Clean up any water that spills on the floor as soon as it happens. Remove soap buildup in the tub or shower regularly. Attach bath mats securely with double-sided non-slip rug tape. Do not have throw rugs and other things on the floor that can make you trip. What can I do in the bedroom? Use night lights. Make sure that you have a light by your bed that is easy to reach. Do not use any sheets or blankets that are too big for your bed. They should not hang down onto the floor. Have a firm chair that has side arms. You can use this for support while you get dressed. Do not have throw rugs and other things on the floor that can make you trip. What can I do in the kitchen? Clean up any spills right away.  Avoid walking on wet floors. Keep items that you use a lot in easy-to-reach places. If you need to reach something above you, use a strong step stool that has a grab bar. Keep electrical cords out of the way. Do not use floor polish or wax that makes floors slippery. If you must use wax, use non-skid floor wax. Do not have throw rugs and other things on the floor that can make you trip. What can I do with my stairs? Do not leave any items on the stairs. Make sure that there are handrails on both sides of the stairs and use them. Fix handrails that are broken or loose. Make sure that handrails are as long as the stairways. Check any carpeting to make sure that it is firmly attached to the stairs. Fix any carpet that is loose or worn. Avoid having throw rugs at the top or bottom of the stairs. If you do have throw rugs, attach them to the floor with carpet tape. Make sure that you have a light switch at the top of the stairs  and the bottom of the stairs. If you do not have them, ask someone to add them for you. What else can I do to help prevent falls? Wear shoes that: Do not have high heels. Have rubber bottoms. Are comfortable and fit you well. Are closed at the toe. Do not wear sandals. If you use a stepladder: Make sure that it is fully opened. Do not climb a closed stepladder. Make sure that both sides of the stepladder are locked into place. Ask someone to hold it for you, if possible. Clearly mark and make sure that you can see: Any grab bars or handrails. First and last steps. Where the edge of each step is. Use tools that help you move around (mobility aids) if they are needed. These include: Canes. Walkers. Scooters. Crutches. Turn on the lights when you go into a dark area. Replace any light bulbs as soon as they burn out. Set up your furniture so you have a clear path. Avoid moving your furniture around. If any of your floors are uneven, fix them. If there are any pets around you, be aware of where they are. Review your medicines with your doctor. Some medicines can make you feel dizzy. This can increase your chance of falling. Ask your doctor what other things that you can do to help prevent falls. This information is not intended to replace advice given to you by your health care provider. Make sure you discuss any questions you have with your health care provider. Document Released: 03/24/2009 Document Revised: 11/03/2015 Document Reviewed: 07/02/2014 Elsevier Interactive Patient Education  2017 [employer].

## 2022-04-19 DIAGNOSIS — M25551 Pain in right hip: Secondary | ICD-10-CM | POA: Diagnosis not present

## 2022-04-26 ENCOUNTER — Telehealth: Payer: Self-pay | Admitting: *Deleted

## 2022-04-26 ENCOUNTER — Encounter: Payer: Self-pay | Admitting: *Deleted

## 2022-04-26 NOTE — Patient Instructions (Signed)
Visit Information  Thank you for taking time to visit with me today. Please don't hesitate to contact me if I can be of assistance to you.   Following are the goals we discussed today:   Goals Addressed               This Visit's Progress     COMPLETED: "Nothing needed today" (pt-stated)        Care Coordination Interventions: Reviewed medications with patient and discussed Adherent with all medications with no needed refills Reviewed scheduled/upcoming provider appointments including sufficient transportation source Screening for signs and symptoms of depression related to chronic disease state  Assessed social determinant of health barriers         Please call the care guide team at [phone] if you need to cancel or reschedule your appointment.   If you are experiencing a Mental Health or [place] or need someone to talk to, please call the Suicide and Crisis Lifeline: 988  Patient verbalizes understanding of instructions and care plan provided today and agrees to view in [place]. Active MyChart status and patient understanding of how to access instructions and care plan via MyChart confirmed with patient.     No further follow up required: No needs presented  Raina Mina, RN Care Management Coordinator [place] Office [phone]

## 2022-04-26 NOTE — Patient Outreach (Signed)
  Care Coordination   Initial Visit Note   04/26/2022 Name: Angela Estrada MRN: [id number] DOB: Mar 20, 1951  Angela Estrada is a 72 y.o. year old female who sees Wendie Agreste, MD for primary care. I spoke with  [name] by phone today.  What matters to the patients health and wellness today?  No needs    Goals Addressed               This Visit's Progress     COMPLETED: "Nothing needed today" (pt-stated)        Care Coordination Interventions: Reviewed medications with patient and discussed Adherent with all medications with no needed refills Reviewed scheduled/upcoming provider appointments including sufficient transportation source Screening for signs and symptoms of depression related to chronic disease state  Assessed social determinant of health barriers         SDOH assessments and interventions completed:  Yes  SDOH Interventions Today    Flowsheet Row Most Recent Value  SDOH Interventions   Food Insecurity Interventions Intervention Not Indicated  Housing Interventions Intervention Not Indicated  Transportation Interventions Intervention Not Indicated  Utilities Interventions Intervention Not Indicated        Care Coordination Interventions Activated:  Yes  Care Coordination Interventions:  Yes, provided   Follow up plan: No further intervention required.   Encounter Outcome:  Pt. Visit Completed   Raina Mina, RN Care Management Coordinator [place] Office [phone]

## 2022-05-28 DIAGNOSIS — I10 Essential (primary) hypertension: Secondary | ICD-10-CM | POA: Diagnosis not present

## 2022-05-28 DIAGNOSIS — R7303 Prediabetes: Secondary | ICD-10-CM | POA: Diagnosis not present

## 2022-05-28 DIAGNOSIS — E785 Hyperlipidemia, unspecified: Secondary | ICD-10-CM | POA: Diagnosis not present

## 2022-05-28 DIAGNOSIS — K219 Gastro-esophageal reflux disease without esophagitis: Secondary | ICD-10-CM | POA: Diagnosis not present

## 2022-06-06 ENCOUNTER — Ambulatory Visit (INDEPENDENT_AMBULATORY_CARE_PROVIDER_SITE_OTHER): Payer: Medicare HMO

## 2022-06-06 ENCOUNTER — Encounter: Payer: Self-pay | Admitting: Podiatry

## 2022-06-06 ENCOUNTER — Other Ambulatory Visit: Payer: Self-pay | Admitting: Podiatry

## 2022-06-06 ENCOUNTER — Ambulatory Visit: Payer: Medicare HMO | Admitting: Podiatry

## 2022-06-06 DIAGNOSIS — M7752 Other enthesopathy of left foot: Secondary | ICD-10-CM | POA: Diagnosis not present

## 2022-06-06 DIAGNOSIS — M779 Enthesopathy, unspecified: Secondary | ICD-10-CM

## 2022-06-06 MED ORDER — TRIAMCINOLONE ACETONIDE 10 MG/ML IJ SUSP
10.0000 mg | Freq: Once | INTRAMUSCULAR | Status: AC
  Administered 2022-06-06: 10 mg

## 2022-06-06 NOTE — Progress Notes (Signed)
Subjective:   Patient ID: Angela Estrada, female   DOB: 71 y.o.   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

## 2022-07-13 ENCOUNTER — Other Ambulatory Visit: Payer: Self-pay | Admitting: Family Medicine

## 2022-07-13 DIAGNOSIS — N3941 Urge incontinence: Secondary | ICD-10-CM

## 2022-08-03 DIAGNOSIS — N3281 Overactive bladder: Secondary | ICD-10-CM | POA: Diagnosis not present

## 2022-08-03 DIAGNOSIS — K219 Gastro-esophageal reflux disease without esophagitis: Secondary | ICD-10-CM | POA: Diagnosis not present

## 2022-08-03 DIAGNOSIS — I10 Essential (primary) hypertension: Secondary | ICD-10-CM | POA: Diagnosis not present

## 2022-08-03 DIAGNOSIS — E876 Hypokalemia: Secondary | ICD-10-CM | POA: Diagnosis not present

## 2022-08-03 DIAGNOSIS — Z823 Family history of stroke: Secondary | ICD-10-CM | POA: Diagnosis not present

## 2022-08-03 DIAGNOSIS — Z803 Family history of malignant neoplasm of breast: Secondary | ICD-10-CM | POA: Diagnosis not present

## 2022-08-03 DIAGNOSIS — E785 Hyperlipidemia, unspecified: Secondary | ICD-10-CM | POA: Diagnosis not present

## 2022-08-03 DIAGNOSIS — R269 Unspecified abnormalities of gait and mobility: Secondary | ICD-10-CM | POA: Diagnosis not present

## 2022-08-03 DIAGNOSIS — Z6841 Body Mass Index (BMI) 40.0 and over, adult: Secondary | ICD-10-CM | POA: Diagnosis not present

## 2022-08-03 DIAGNOSIS — Z8249 Family history of ischemic heart disease and other diseases of the circulatory system: Secondary | ICD-10-CM | POA: Diagnosis not present

## 2022-08-27 ENCOUNTER — Telehealth: Payer: Self-pay | Admitting: Family Medicine

## 2022-08-27 NOTE — Telephone Encounter (Signed)
[facility] patient information mail placed at front desk in bin.

## 2022-08-28 NOTE — Telephone Encounter (Signed)
Placed in your review folder  

## 2022-08-29 NOTE — Telephone Encounter (Signed)
Paperwork reviewed - placed in scan bin.

## 2022-09-17 ENCOUNTER — Encounter: Payer: Self-pay | Admitting: Family Medicine

## 2022-09-17 ENCOUNTER — Ambulatory Visit (INDEPENDENT_AMBULATORY_CARE_PROVIDER_SITE_OTHER): Payer: Medicare HMO | Admitting: Family Medicine

## 2022-09-17 VITALS — BP 102/60 | HR 74 | Temp 98.8°F | Ht 62.0 in | Wt 226.2 lb

## 2022-09-17 DIAGNOSIS — I10 Essential (primary) hypertension: Secondary | ICD-10-CM | POA: Diagnosis not present

## 2022-09-17 DIAGNOSIS — R1011 Right upper quadrant pain: Secondary | ICD-10-CM

## 2022-09-17 DIAGNOSIS — R7303 Prediabetes: Secondary | ICD-10-CM

## 2022-09-17 DIAGNOSIS — R0789 Other chest pain: Secondary | ICD-10-CM

## 2022-09-17 DIAGNOSIS — E7800 Pure hypercholesterolemia, unspecified: Secondary | ICD-10-CM | POA: Diagnosis not present

## 2022-09-17 DIAGNOSIS — R1310 Dysphagia, unspecified: Secondary | ICD-10-CM | POA: Diagnosis not present

## 2022-09-17 DIAGNOSIS — R12 Heartburn: Secondary | ICD-10-CM | POA: Diagnosis not present

## 2022-09-17 DIAGNOSIS — R1110 Vomiting, unspecified: Secondary | ICD-10-CM | POA: Diagnosis not present

## 2022-09-17 LAB — LIPID PANEL
Cholesterol: 160 mg/dL (ref 0–200)
HDL: 59.2 mg/dL (ref >39.00)
LDL Cholesterol: 83 mg/dL (ref 0–99)
NonHDL: 100.9
Total CHOL/HDL Ratio: 3
Triglycerides: 90 mg/dL (ref 0.0–149.0)
VLDL: 18 mg/dL (ref 0.0–40.0)

## 2022-09-17 LAB — CBC
HCT: 43.5 % (ref 36.0–46.0)
Hemoglobin: 14.7 g/dL (ref 12.0–15.0)
MCHC: 33.8 g/dL (ref 30.0–36.0)
MCV: 89.5 fl (ref 78.0–100.0)
Platelets: 257 10*3/uL (ref 150.0–400.0)
RBC: 4.86 Mil/uL (ref 3.87–5.11)
RDW: 13.4 % (ref 11.5–15.5)
WBC: 6.2 10*3/uL (ref 4.0–10.5)

## 2022-09-17 LAB — COMPREHENSIVE METABOLIC PANEL
ALT: 21 U/L (ref 0–35)
AST: 26 U/L (ref 0–37)
Albumin: 4.1 g/dL (ref 3.5–5.2)
Alkaline Phosphatase: 58 U/L (ref 39–117)
BUN: 10 mg/dL (ref 6–23)
CO2: 32 mEq/L (ref 19–32)
Calcium: 9.8 mg/dL (ref 8.4–10.5)
Chloride: 99 mEq/L (ref 96–112)
Creatinine, Ser: 0.71 mg/dL (ref 0.40–1.20)
GFR: 86.31 mL/min (ref >60.00)
Glucose, Bld: 92 mg/dL (ref 70–99)
Potassium: 3.5 mEq/L (ref 3.5–5.1)
Sodium: 137 mEq/L (ref 135–145)
Total Bilirubin: 0.8 mg/dL (ref 0.2–1.2)
Total Protein: 7.4 g/dL (ref 6.0–8.3)

## 2022-09-17 LAB — LIPASE: Lipase: 14 U/L (ref 11.0–59.0)

## 2022-09-17 LAB — HEMOGLOBIN A1C: Hgb A1c MFr Bld: 6.2 % (ref 4.6–6.5)

## 2022-09-17 MED ORDER — PANTOPRAZOLE SODIUM 40 MG PO TBEC: 40.0000 mg | DELAYED_RELEASE_TABLET | Freq: Every day | ORAL | 3 refills | Status: DC

## 2022-09-17 MED ORDER — SUCRALFATE 1 G PO TABS: 1.0000 g | ORAL_TABLET | Freq: Three times a day (TID) | ORAL | 0 refills | Status: DC

## 2022-09-17 NOTE — Patient Instructions (Signed)
I will check the routine labs for your medications and let me know if any changes needed.  I am checking some lab work for the abdominal pain and chest symptoms.  I suspect this is heartburn related so can try adding Protonix once per day as well as Carafate with meals and at bedtime if that is helpful.  Recheck with me next week but if any worsening in symptoms between now and then, be seen right away.  Hope you feel better soon.  Return to the clinic or go to the nearest emergency room if any of your symptoms worsen or new symptoms occur.   Abdominal Pain, Adult Pain in the abdomen (abdominal pain) can be caused by many things. Often, abdominal pain is not serious and it gets better with no treatment or by being treated at home. However, sometimes abdominal pain is serious. Your health care provider will ask questions about your medical history and do a physical exam to try to determine the cause of your abdominal pain. Follow these instructions at home: Medicines Take over-the-counter and prescription medicines only as told by your health care provider. Do not take a laxative unless told by your health care provider. General instructions  Watch your condition for any changes. Drink enough fluid to keep your urine pale yellow. Keep all follow-up visits as told by your health care provider. This is important. Contact a health care provider if: Your abdominal pain changes or gets worse. You are not hungry or you lose weight without trying. You are constipated or have diarrhea for more than 2-3 days. You have pain when you urinate or have a bowel movement. Your abdominal pain wakes you up at night. Your pain gets worse with meals, after eating, or with certain foods. You are vomiting and cannot keep anything down. You have a fever. You have blood in your urine. Get help right away if: Your pain does not go away as soon as your health care provider told you to expect. You cannot stop  vomiting. Your pain is only in areas of the abdomen, such as the right side or the left lower portion of the abdomen. Pain on the right side could be caused by appendicitis. You have bloody or black stools, or stools that look like tar. You have severe pain, cramping, or bloating in your abdomen. You have signs of dehydration, such as: Dark urine, very little urine, or no urine. Cracked lips. Dry mouth. Sunken eyes. Sleepiness. Weakness. You have trouble breathing or chest pain. Summary Often, abdominal pain is not serious and it gets better with no treatment or by being treated at home. However, sometimes abdominal pain is serious. Watch your condition for any changes. Take over-the-counter and prescription medicines only as told by your health care provider. Contact a health care provider if your abdominal pain changes or gets worse. Get help right away if you have severe pain, cramping, or bloating in your abdomen. This information is not intended to replace advice given to you by your health care provider. Make sure you discuss any questions you have with your health care provider. Document Revised: 07/17/2019 Document Reviewed: 10/06/2018 Elsevier Patient Education  2023 [employer].

## 2022-09-17 NOTE — Progress Notes (Unsigned)
Subjective:  Patient ID: Angela Estrada, female    DOB: September 30, 1950  Age: 72 y.o. MRN: [id number]  CC:  Chief Complaint  Patient presents with   Hypertension   Hyperlipidemia   Emesis    Pt states when she woke up Wednesday night she vomited twice throughout the night and has since had a pain in her right abdomin going up the middle of her chest. Hipp cuts when she drinks fluids since as well.     HPI Angela Estrada presents for   Abdominal pain, emesis As above, started last Wednesday.  Started all of a sudden. Woke up at midnight - vomited twice overnight, no diarrhea. No measured fever. Hot and col d next day,  resolved 3 days ago. had pain in the right abdomen, moving to R chest since last week. Still dull pain in area, sharp or burning at times, no pressure/squeeze.  less but still there.  No new cough or dyspnea.  Some hiccups since last week. Not back to 100% but better.  History of cholecystectomy. Pepcid 20 mg twice daily for GERD. Some breakthrough heartburn at times, no change recently.  Able to eat and drink, but feeling of food getting stuck - harder to pass, but no regurgitation.  No melena/hematochezia and normal bowel movements -no diarrhea/constipation.   Prediabetes: Borderline prior. No current meds.  Weight down 2 pounds. Lab Results  Component Value Date   HGBA1C 5.7 (H) 01/23/2022   Wt Readings from Last 3 Encounters:  09/17/22 226 lb 3.2 oz (102.6 kg)  03/29/22 228 lb (103.4 kg)  03/15/22 228 lb 3.2 oz (103.5 kg)   Hyperlipidemia: Pravastatin 20 mg daily, no new side effects or myalgias.  Lab Results  Component Value Date   CHOL 159 03/15/2022   HDL 64.50 03/15/2022   LDLCALC 70 03/15/2022   TRIG 124.0 03/15/2022   CHOLHDL 2 03/15/2022   Lab Results  Component Value Date   ALT 20 03/15/2022   AST 24 03/15/2022   ALKPHOS 60 03/15/2022   BILITOT 0.7 03/15/2022    Hypertension: Amlodipine 2.5 mg daily, Toprol-XL 25 mg daily,   telmisartan HCT 80/25 mg daily and potassium 20 mEq daily. No new side effects. No new chest pain other than symptoms above.  Home readings:none.  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   Arthritis of right hip 03/19/2016   Primary osteoarthritis of right hip 03/18/2016   Hypokalemia 03/29/2014   Arthralgia 02/11/2014   Routine general medical examination at a health care facility 03/28/2013   Nonspecific abnormal findings on radiological and examination of lung field 03/27/2013   Cough 03/11/2013   Unspecified disorder of liver 03/09/2013   Pyuria 03/09/2013   Arthritis    Ventral hernia 01/18/2012   Encounter for long-term (current) use of other medications 07/05/2011   HYPERGLYCEMIA 08/12/2008   HYPERCHOLESTEROLEMIA 04/25/2007   DIZZINESS AND GIDDINESS 04/25/2007   Essential hypertension 01/03/2007   ALLERGIC RHINITIS 01/03/2007   Past Medical History:  Diagnosis Date   Abdominal hernia    RLQ   Allergy    Arthritis  Arthritis    Borderline diabetes    GERD (gastroesophageal reflux disease)    HYPERCHOLESTEROLEMIA 04/25/2007   takes Pravastatin daily   HYPERTENSION 01/03/2007   takes Losartan-HCTZ  and Amlodipine daily   Hypokalemia    takes Potassium daily   Joint pain    Nocturia    Pneumonia 2 yrs ago   Primary osteoarthritis of shoulder    Right   Past Surgical History:  Procedure Laterality Date   CESAREAN SECTION  25 years ago   CHOLECYSTECTOMY     COLONOSCOPY     DILATION AND CURETTAGE OF UTERUS  35 years ago   HERNIA REPAIR     INCISIONAL HERNIA REPAIR  02/21/2012    Procedure: LAPAROSCOPIC INCISIONAL HERNIA;  Surgeon: Shelly Rubenstein, MD;  Location: [facility] OR;  Service: General;  Laterality: N/A;  laparoscopic incisional hernia repair with mesh   JOINT REPLACEMENT     bilateral knee replacements   REPLACEMENT TOTAL KNEE BILATERAL  2010   TOTAL HIP ARTHROPLASTY Right 03/19/2016   Procedure: RIGHT TOTAL HIP ARTHROPLASTY ANTERIOR APPROACH;  Surgeon: Gean Birchwood, MD;  Location: [facility] OR;  Service: Orthopedics;  Laterality: Right;   TOTAL HIP REVISION Right 10/02/2017   Procedure: TOTAL HIP REVISION POSTERIOR;  Surgeon: Gean Birchwood, MD;  Location: [facility] OR;  Service: Orthopedics;  Laterality: Right;   TOTAL HIP REVISION Right 05/23/2020   Procedure: RIGHT TOTAL POSTERIOR HIP REVISION;  Surgeon: Gean Birchwood, MD;  Location: [facility] OR[facility];  Service: Orthopedics;  Laterality: Right;   TOTAL SHOULDER ARTHROPLASTY Right 12/19/2017   Procedure: RIGHT TOTAL SHOULDER ARTHROPLASTY;  Surgeon: Jones Broom, MD;  Location: [facility];  Service: Orthopedics;  Laterality: Right;   TUBAL LIGATION     Allergies  Allergen Reactions   Lisinopril Cough   Oxycodone Other (See Comments)    Hallucinations   Prior to Admission medications   Medication Sig Start Date End Date Taking? Authorizing Provider  amLODipine (NORVASC) 2.5 MG tablet Take 1 tablet (2.5 mg total) by mouth daily. 03/15/22  Yes Shade Flood, MD  Apoaequorin (PREVAGEN) 10 MG CAPS Take 10 mg by mouth daily.   Yes [provider]  aspirin EC 81 MG tablet Take 1 tablet (81 mg total) by mouth 2 (two) times daily. 05/23/20  Yes Allena Katz, PA-C  Carboxymethylcellul-Glycerin (LUBRICATING EYE DROPS OP) Place 1 drop into both eyes daily as needed (dry eyes).   Yes [provider]  cetirizine-pseudoephedrine (ZYRTEC-D) 5-120 MG tablet Take 1 tablet by mouth daily as needed for allergies.   Yes [provider]  famotidine (PEPCID) 20 MG tablet Take 1 tablet (20 mg total) by mouth 2 (two) times daily.  03/15/22  Yes Shade Flood, MD  metoprolol succinate (TOPROL-XL) 25 MG 24 hr tablet Take 25 mg by mouth daily. 08/03/20  Yes [provider]  Multiple Vitamin (MULTIVITAMIN WITH MINERALS) TABS tablet Take 1 tablet by mouth daily.   Yes [provider]  oxybutynin (DITROPAN) 5 MG tablet TAKE 1 TABLET BY MOUTH THREE TIMES A DAY 07/13/22  Yes Shade Flood, MD  potassium chloride SA (KLOR-CON M20) 20 MEQ tablet Take 1 tablet (20 mEq total) by mouth 2 (two) times daily. 03/15/22  Yes Shade Flood, MD  pravastatin (PRAVACHOL) 20 MG tablet TAKE 1 TABLET BY MOUTH EVERY DAY IN THE EVENING 01/22/22  Yes Shade Flood, MD  telmisartan-hydrochlorothiazide (MICARDIS HCT) 80-25 MG tablet Take 1 tablet by mouth daily. 03/15/22  Yes Shade Flood,  MD   Social History   Socioeconomic History   Marital status: Married    Spouse name: Not on file   Number of children: 1   Years of education: 12th grade +   Highest education level: Not on file  Occupational History   Occupation: Beautician  Tobacco Use   Smoking status: Never   Smokeless tobacco: Never  Vaping Use   Vaping Use: Never used  Substance and Sexual Activity   Alcohol use: No    Alcohol/week: 0.0 standard drinks of alcohol   Drug use: No   Sexual activity: Not Currently    Partners: Male    Birth control/protection: Post-menopausal    Comment: first intercourse >6 y.o  Other Topics Concern   Not on file  Social History Narrative   Lives with her husband.   Adult daughter lives nearby.   Social Determinants of Health   Financial Resource Strain: Low Risk  (03/29/2022)   Overall Financial Resource Strain (CARDIA)    Difficulty of Paying Living Expenses: Not hard at all  Food Insecurity: No Food Insecurity (04/26/2022)   Hunger Vital Sign    Worried About Running Out of Food in the Last Year: Never true    Ran Out of Food in the Last Year: Never true  Transportation Needs: No Transportation Needs  (04/26/2022)   PRAPARE - Administrator, Civil Service (Medical): No    Lack of Transportation (Non-Medical): No  Physical Activity: Insufficiently Active (03/29/2022)   Exercise Vital Sign    Days of Exercise per Week: 4 days    Minutes of Exercise per Session: 30 min  Stress: No Stress Concern Present (03/29/2022)   [employer] of Occupational Health - Occupational Stress Questionnaire    Feeling of Stress : Not at all  Social Connections: Moderately Integrated (03/29/2022)   Social Connection and Isolation Panel [NHANES]    Frequency of Communication with Friends and Family: More than three times a week    Frequency of Social Gatherings with Friends and Family: More than three times a week    Attends Religious Services: More than 4 times per year    Active Member of [employer] or Organizations: No    Attends Banker Meetings: Never    Marital Status: Married  Catering manager Violence: Not At Risk (03/29/2022)   Humiliation, Afraid, Rape, and Kick questionnaire    Fear of Current or Ex-Partner: No    Emotionally Abused: No    Physically Abused: No    Sexually Abused: No    Review of Systems Per HPI.   Objective:   Vitals:   09/17/22 1105  BP: 102/60  Pulse: 74  Temp: 98.8 F (37.1 C)  TempSrc: Temporal  SpO2: 100%  Weight: 226 lb 3.2 oz (102.6 kg)  Height: 5\' 2"  (1.575 m)     Physical Exam Vitals reviewed.  Constitutional:      Appearance: Normal appearance. She is well-developed.  HENT:     Head: Normocephalic and atraumatic.  Eyes:     Conjunctiva/sclera: Conjunctivae normal.     Pupils: Pupils are equal, round, and reactive to light.  Neck:     Vascular: No carotid bruit.  Cardiovascular:     Rate and Rhythm: Normal rate and regular rhythm.     Heart sounds: Normal heart sounds.  Pulmonary:     Effort: Pulmonary effort is normal.     Breath sounds: Normal breath sounds.     Comments: Chest wall nontender  Abdominal:      General: There is no distension.     Palpations: Abdomen is soft. There is no pulsatile mass.     Tenderness: There is abdominal tenderness (Right upper quadrant without guarding.). There is no guarding or rebound.  Musculoskeletal:     Right lower leg: No edema.     Left lower leg: No edema.  Skin:    General: Skin is warm and dry.  Neurological:     Mental Status: She is alert and oriented to person, place, and time.  Psychiatric:        Mood and Affect: Mood normal.        Behavior: Behavior normal.    EKG, sinus rhythm, rate 65.  Minimal voltage criteria for LVH, no apparent acute ST or T wave changes although some baseline artifact.  No apparent significant changes from previous EKG on 07/27/2020.   Assessment & Plan:  JIRAH RIDER is a 72 y.o. female . RUQ abdominal pain - Plan: Comprehensive metabolic panel, CBC Heartburn - Plan: pantoprazole (PROTONIX) 40 MG tablet, sucralfate (CARAFATE) 1 g tablet Dysphagia, unspecified type - Plan: pantoprazole (PROTONIX) 40 MG tablet, sucralfate (CARAFATE) 1 g tablet Chest discomfort - Plan: EKG 12-Lead Vomiting, unspecified vomiting type, unspecified whether nausea present - Plan: CBC, Lipase  -Initial vomiting episodes improved, persistent episodic discomfort radiating from right upper quadrant to chest but heartburn symptoms.  Dysphagia symptoms but able to clear fluids and food.  Reassuring EKG.  Possible foodborne illness versus esophagitis, GERD flare.  Check labs including lipase, CBC, CMP.  Add PPI with Protonix daily, continue Pepcid.  Add Carafate.  ER precautions given.  Recheck 1 week  Essential hypertension - Plan: Comprehensive metabolic panel  -Tolerating current regimen, check updated labs, follow-up next week.  Prediabetes - Plan: Hemoglobin A1c  -Check updated labs, plan adjustment accordingly, follow-up planned above.  Pure hypercholesterolemia - Plan: Comprehensive metabolic panel, Lipid panel Tolerating statin,  continue same, check updated labs with plan adjustment accordingly   Meds ordered this encounter  Medications   pantoprazole (PROTONIX) 40 MG tablet    Sig: Take 1 tablet (40 mg total) by mouth daily.    Dispense:  30 tablet    Refill:  3   sucralfate (CARAFATE) 1 g tablet    Sig: Take 1 tablet (1 g total) by mouth 4 (four) times daily -  with meals and at bedtime.    Dispense:  40 tablet    Refill:  0   Patient Instructions  I will check the routine labs for your medications and let me know if any changes needed.  I am checking some lab work for the abdominal pain and chest symptoms.  I suspect this is heartburn related so can try adding Protonix once per day as well as Carafate with meals and at bedtime if that is helpful.  Recheck with me next week but if any worsening in symptoms between now and then, be seen right away.  Hope you feel better soon.  Return to the clinic or go to the nearest emergency room if any of your symptoms worsen or new symptoms occur.   Abdominal Pain, Adult Pain in the abdomen (abdominal pain) can be caused by many things. Often, abdominal pain is not serious and it gets better with no treatment or by being treated at home. However, sometimes abdominal pain is serious. Your health care provider will ask questions about your medical history and do a physical exam to try  to determine the cause of your abdominal pain. Follow these instructions at home: Medicines Take over-the-counter and prescription medicines only as told by your health care provider. Do not take a laxative unless told by your health care provider. General instructions  Watch your condition for any changes. Drink enough fluid to keep your urine pale yellow. Keep all follow-up visits as told by your health care provider. This is important. Contact a health care provider if: Your abdominal pain changes or gets worse. You are not hungry or you lose weight without trying. You are  constipated or have diarrhea for more than 2-3 days. You have pain when you urinate or have a bowel movement. Your abdominal pain wakes you up at night. Your pain gets worse with meals, after eating, or with certain foods. You are vomiting and cannot keep anything down. You have a fever. You have blood in your urine. Get help right away if: Your pain does not go away as soon as your health care provider told you to expect. You cannot stop vomiting. Your pain is only in areas of the abdomen, such as the right side or the left lower portion of the abdomen. Pain on the right side could be caused by appendicitis. You have bloody or black stools, or stools that look like tar. You have severe pain, cramping, or bloating in your abdomen. You have signs of dehydration, such as: Dark urine, very little urine, or no urine. Cracked lips. Dry mouth. Sunken eyes. Sleepiness. Weakness. You have trouble breathing or chest pain. Summary Often, abdominal pain is not serious and it gets better with no treatment or by being treated at home. However, sometimes abdominal pain is serious. Watch your condition for any changes. Take over-the-counter and prescription medicines only as told by your health care provider. Contact a health care provider if your abdominal pain changes or gets worse. Get help right away if you have severe pain, cramping, or bloating in your abdomen. This information is not intended to replace advice given to you by your health care provider. Make sure you discuss any questions you have with your health care provider. Document Revised: 07/17/2019 Document Reviewed: 10/06/2018 Elsevier Patient Education  2023 [employer]     Signed,   Meredith StaggersJeffrey Rachyl Wuebker, MD  Primary Care, [facility] 09/17/22 12:07 PM

## 2022-09-18 ENCOUNTER — Encounter: Payer: Self-pay | Admitting: Family Medicine

## 2022-09-24 DIAGNOSIS — R7303 Prediabetes: Secondary | ICD-10-CM | POA: Diagnosis not present

## 2022-09-24 DIAGNOSIS — I10 Essential (primary) hypertension: Secondary | ICD-10-CM | POA: Diagnosis not present

## 2022-09-24 DIAGNOSIS — E782 Mixed hyperlipidemia: Secondary | ICD-10-CM | POA: Diagnosis not present

## 2022-09-27 ENCOUNTER — Encounter: Payer: Self-pay | Admitting: Family Medicine

## 2022-09-27 ENCOUNTER — Ambulatory Visit (INDEPENDENT_AMBULATORY_CARE_PROVIDER_SITE_OTHER): Payer: Medicare HMO | Admitting: Family Medicine

## 2022-09-27 ENCOUNTER — Ambulatory Visit: Payer: Medicare HMO | Admitting: Family Medicine

## 2022-09-27 VITALS — BP 130/64 | HR 75 | Temp 98.9°F | Ht 62.0 in | Wt 226.2 lb

## 2022-09-27 DIAGNOSIS — R1011 Right upper quadrant pain: Secondary | ICD-10-CM

## 2022-09-27 DIAGNOSIS — R12 Heartburn: Secondary | ICD-10-CM

## 2022-09-27 NOTE — Patient Instructions (Signed)
Glad to hear that you are doing better.  Since symptoms improved so quickly, I think it is reasonable to stop the Carafate at this time.  If you continue to feel well in 1 week then can stop the pantoprazole.   If pain comes back coming off the Carafate, restart Carafate and let me know.   If pain comes back after stopping the pantoprazole, restart it and let me know.  Take care!

## 2022-09-27 NOTE — Progress Notes (Signed)
Subjective:  Patient ID: Angela Estrada, female    DOB: [birth year]  Age: 72 y.o. MRN: [id number]  CC:  Chief Complaint  Patient presents with   Abdominal Pain    Pt states that pain has been a lot better since taking medication prescribed     HPI Angela Estrada presents for   Right upper quadrant abdominal pain, with initial emesis.  Follow-up from April 8.  Possible foodborne illness versus esophagitis, GERD flare.  Lipase, CBC, CMP overall reassuring, added PPI with Protonix daily and continued on Pepcid as well as Carafate for some possible esophagitis/dysphagia symptoms.  Feeling much better. Still using carafate with meals. Protonix daily and pepcid daily.  No further n/v abd pain or difficulty swallowing.  Improved 2 days after meds.      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   Arthritis of right hip 03/19/2016   Primary osteoarthritis of right hip 03/18/2016   Hypokalemia 03/29/2014   Arthralgia 02/11/2014   Routine general medical examination at a health care facility 03/28/2013   Nonspecific abnormal findings on radiological and examination of lung field 03/27/2013   Cough 03/11/2013   Unspecified disorder of liver 03/09/2013   Pyuria 03/09/2013   Arthritis    Ventral hernia 01/18/2012   Encounter for long-term (current) use of other medications 07/05/2011   HYPERGLYCEMIA 08/12/2008   HYPERCHOLESTEROLEMIA 04/25/2007   DIZZINESS AND GIDDINESS 04/25/2007   Essential hypertension 01/03/2007   ALLERGIC RHINITIS 01/03/2007   Past Medical History:  Diagnosis Date   Abdominal hernia    RLQ   Allergy    Arthritis    Arthritis    Borderline diabetes    GERD (gastroesophageal reflux disease)    HYPERCHOLESTEROLEMIA 04/25/2007    takes Pravastatin daily   HYPERTENSION 01/03/2007   takes Losartan-HCTZ  and Amlodipine daily   Hypokalemia    takes Potassium daily   Joint pain    Nocturia    Pneumonia 2 yrs ago   Primary osteoarthritis of shoulder    Right   Past Surgical History:  Procedure Laterality Date   CESAREAN SECTION  25 years ago   CHOLECYSTECTOMY     COLONOSCOPY     DILATION AND CURETTAGE OF UTERUS  35 years ago   HERNIA REPAIR     INCISIONAL HERNIA REPAIR  02/21/2012   Procedure: LAPAROSCOPIC INCISIONAL HERNIA;  Surgeon: Shelly Rubenstein, MD;  Location: [facility] OR;  Service: General;  Laterality: N/A;  laparoscopic incisional hernia repair with mesh   JOINT REPLACEMENT     bilateral knee replacements   REPLACEMENT TOTAL KNEE BILATERAL  2010   TOTAL HIP ARTHROPLASTY Right 03/19/2016   Procedure: RIGHT TOTAL HIP ARTHROPLASTY ANTERIOR APPROACH;  Surgeon: Gean Birchwood, MD;  Location: [facility] OR;  Service: Orthopedics;  Laterality: Right;   TOTAL HIP REVISION Right 10/02/2017   Procedure: TOTAL HIP REVISION POSTERIOR;  Surgeon: Gean Birchwood, MD;  Location: [facility] OR;  Service: Orthopedics;  Laterality: Right;   TOTAL HIP REVISION Right 05/23/2020   Procedure: RIGHT TOTAL POSTERIOR HIP REVISION;  Surgeon: Gean Birchwood, MD;  Location: [facility];  Service: Orthopedics;  Laterality: Right;   TOTAL SHOULDER ARTHROPLASTY Right 12/19/2017   Procedure: RIGHT TOTAL SHOULDER ARTHROPLASTY;  Surgeon: Jones Broom, MD;  Location: [facility];  Service:  Orthopedics;  Laterality: Right;   TUBAL LIGATION     Allergies  Allergen Reactions   Lisinopril Cough   Oxycodone Other (See Comments)    Hallucinations   Prior to Admission medications   Medication Sig Start Date End Date Taking? Authorizing Provider  amLODipine (NORVASC) 2.5 MG tablet Take 1 tablet (2.5 mg total) by mouth daily. 03/15/22  Yes Shade Flood, MD  Apoaequorin (PREVAGEN) 10 MG CAPS Take 10 mg by mouth daily.   Yes [provider]  aspirin EC 81 MG tablet  Take 1 tablet (81 mg total) by mouth 2 (two) times daily. 05/23/20  Yes Allena Katz, PA-C  Carboxymethylcellul-Glycerin (LUBRICATING EYE DROPS OP) Place 1 drop into both eyes daily as needed (dry eyes).   Yes [provider]  cetirizine-pseudoephedrine (ZYRTEC-D) 5-120 MG tablet Take 1 tablet by mouth daily as needed for allergies.   Yes [provider]  famotidine (PEPCID) 20 MG tablet Take 1 tablet (20 mg total) by mouth 2 (two) times daily. 03/15/22  Yes Shade Flood, MD  metoprolol succinate (TOPROL-XL) 25 MG 24 hr tablet Take 25 mg by mouth daily. 08/03/20  Yes [provider]  Multiple Vitamin (MULTIVITAMIN WITH MINERALS) TABS tablet Take 1 tablet by mouth daily.   Yes [provider]  oxybutynin (DITROPAN) 5 MG tablet TAKE 1 TABLET BY MOUTH THREE TIMES A DAY 07/13/22  Yes Shade Flood, MD  pantoprazole (PROTONIX) 40 MG tablet Take 1 tablet (40 mg total) by mouth daily. 09/17/22  Yes Shade Flood, MD  potassium chloride SA (KLOR-CON M20) 20 MEQ tablet Take 1 tablet (20 mEq total) by mouth 2 (two) times daily. 03/15/22  Yes Shade Flood, MD  pravastatin (PRAVACHOL) 20 MG tablet TAKE 1 TABLET BY MOUTH EVERY DAY IN THE EVENING 01/22/22  Yes Shade Flood, MD  sucralfate (CARAFATE) 1 g tablet Take 1 tablet (1 g total) by mouth 4 (four) times daily -  with meals and at bedtime. 09/17/22  Yes Shade Flood, MD  telmisartan-hydrochlorothiazide (MICARDIS HCT) 80-25 MG tablet Take 1 tablet by mouth daily. 03/15/22  Yes Shade Flood, MD   Social History   Socioeconomic History   Marital status: Married    Spouse name: Not on file   Number of children: 1   Years of education: 12th grade +   Highest education level: Not on file  Occupational History   Occupation: Beautician  Tobacco Use   Smoking status: Never   Smokeless tobacco: Never  Vaping Use   Vaping Use: Never used  Substance and Sexual Activity   Alcohol use: No     Alcohol/week: 0.0 standard drinks of alcohol   Drug use: No   Sexual activity: Not Currently    Partners: Male    Birth control/protection: Post-menopausal    Comment: first intercourse >57 y.o  Other Topics Concern   Not on file  Social History Narrative   Lives with her husband.   Adult daughter lives nearby.   Social Determinants of Health   Financial Resource Strain: Low Risk  (03/29/2022)   Overall Financial Resource Strain (CARDIA)    Difficulty of Paying Living Expenses: Not hard at all  Food Insecurity: No Food Insecurity (04/26/2022)   Hunger Vital Sign    Worried About Running Out of Food in the Last Year: Never true    Ran Out of Food in the Last Year: Never true  Transportation Needs: No Transportation Needs (04/26/2022)  PRAPARE - Administrator, Civil Service (Medical): No    Lack of Transportation (Non-Medical): No  Physical Activity: Insufficiently Active (03/29/2022)   Exercise Vital Sign    Days of Exercise per Week: 4 days    Minutes of Exercise per Session: 30 min  Stress: No Stress Concern Present (03/29/2022)   [employer] of Occupational Health - Occupational Stress Questionnaire    Feeling of Stress : Not at all  Social Connections: Moderately Integrated (03/29/2022)   Social Connection and Isolation Panel [NHANES]    Frequency of Communication with Friends and Family: More than three times a week    Frequency of Social Gatherings with Friends and Family: More than three times a week    Attends Religious Services: More than 4 times per year    Active Member of [employer] or Organizations: No    Attends Banker Meetings: Never    Marital Status: Married  Catering manager Violence: Not At Risk (03/29/2022)   Humiliation, Afraid, Rape, and Kick questionnaire    Fear of Current or Ex-Partner: No    Emotionally Abused: No    Physically Abused: No    Sexually Abused: No    Review of Systems   Objective:   Vitals:    09/27/22 1547  BP: 130/64  Pulse: 75  Temp: 98.9 F (37.2 C)  TempSrc: Temporal  SpO2: 97%  Weight: 226 lb 3.2 oz (102.6 kg)  Height:  (1.575 m)     Physical Exam Vitals reviewed.  Constitutional:      Appearance: Normal appearance. She is well-developed.  HENT:     Head: Normocephalic and atraumatic.  Eyes:     Conjunctiva/sclera: Conjunctivae normal.     Pupils: Pupils are equal, round, and reactive to light.  Neck:     Vascular: No carotid bruit.  Cardiovascular:     Rate and Rhythm: Normal rate and regular rhythm.     Heart sounds: Normal heart sounds.  Pulmonary:     Effort: Pulmonary effort is normal.     Breath sounds: Normal breath sounds.  Abdominal:     General: Abdomen is flat. There is no distension.     Palpations: Abdomen is soft. There is no pulsatile mass.     Tenderness: There is no abdominal tenderness. There is no guarding.  Musculoskeletal:     Right lower leg: No edema.     Left lower leg: No edema.  Skin:    General: Skin is warm and dry.  Neurological:     Mental Status: She is alert and oriented to person, place, and time.  Psychiatric:        Mood and Affect: Mood normal.        Behavior: Behavior normal.     Assessment & Plan:  Angela Estrada is a 72 y.o. female . RUQ abdominal pain  Heartburn Abdominal pain has improved, quickly resolved with start of meds above.  Possible esophagitis versus gastritis, temporary symptoms from infection versus inflammation.  Labs are reassuring.  As she has done so well, with quick resolution of symptoms we will try coming off Carafate at this time, continue PPI and H2 blocker.  If symptoms return, restart Carafate and let me know as I would want her to see GI at that time.  Additionally can continue PPI for 1 more week, then discontinue PPI as well, continuing only her Pepcid that she had taken previously if symptoms are still stable.  If return  of abdominal pain, heartburn, or other symptoms off  PPI, restart PPI and again let me know as I would want her to see GI.  RTC precautions given.  69-month follow-up for chronic med follow-up, labs reviewed.  No orders of the defined types were placed in this encounter.  Patient Instructions  Glad to hear that you are doing better.  Since symptoms improved so quickly, I think it is reasonable to stop the Carafate at this time.  If you continue to feel well in 1 week then can stop the pantoprazole.   If pain comes back coming off the Carafate, restart Carafate and let me know.   If pain comes back after stopping the pantoprazole, restart it and let me know.  Take care!    Signed,   Meredith Staggers, MD [facility], [facility] 09/27/22 4:53 PM

## 2022-09-30 ENCOUNTER — Other Ambulatory Visit: Payer: Self-pay | Admitting: Family Medicine

## 2022-09-30 DIAGNOSIS — I10 Essential (primary) hypertension: Secondary | ICD-10-CM

## 2022-11-08 ENCOUNTER — Other Ambulatory Visit: Payer: Self-pay | Admitting: Family Medicine

## 2022-11-08 DIAGNOSIS — I10 Essential (primary) hypertension: Secondary | ICD-10-CM

## 2022-12-14 ENCOUNTER — Other Ambulatory Visit: Payer: Self-pay | Admitting: Family Medicine

## 2022-12-14 DIAGNOSIS — R1310 Dysphagia, unspecified: Secondary | ICD-10-CM

## 2022-12-14 DIAGNOSIS — R12 Heartburn: Secondary | ICD-10-CM

## 2022-12-19 ENCOUNTER — Ambulatory Visit (INDEPENDENT_AMBULATORY_CARE_PROVIDER_SITE_OTHER): Payer: Medicare HMO | Admitting: *Deleted

## 2022-12-19 DIAGNOSIS — Z0000 Encounter for general adult medical examination without abnormal findings: Secondary | ICD-10-CM

## 2022-12-19 NOTE — Patient Instructions (Signed)
Angela Estrada , Thank you for taking time to come for your Medicare Wellness Visit. I appreciate your ongoing commitment to your health goals. Please review the following plan we discussed and let me know if I can assist you in the future.   Screening recommendations/referrals: Colonoscopy: up to date Mammogram: up to date Bone Density: up to date Recommended yearly ophthalmology/optometry visit for glaucoma screening and checkup Recommended yearly dental visit for hygiene and checkup  Vaccinations: Influenza vaccine: up to date Pneumococcal vaccine: up to date Tdap vaccine: up to date Shingles vaccine: up to date    Advanced directives: Education provided    Preventive Care 65 Years and Older, Female Preventive care refers to lifestyle choices and visits with your health care provider that can promote health and wellness. What does preventive care include? A yearly physical exam. This is also called an annual well check. Dental exams once or twice a year. Routine eye exams. Ask your health care provider how often you should have your eyes checked. Personal lifestyle choices, including: Daily care of your teeth and gums. Regular physical activity. Eating a healthy diet. Avoiding tobacco and drug use. Limiting alcohol use. Practicing safe sex. Taking low-dose aspirin every day. Taking vitamin and mineral supplements as recommended by your health care provider. What happens during an annual well check? The services and screenings done by your health care provider during your annual well check will depend on your age, overall health, lifestyle risk factors, and family history of disease. Counseling  Your health care provider may ask you questions about your: Alcohol use. Tobacco use. Drug use. Emotional well-being. Home and relationship well-being. Sexual activity. Eating habits. History of falls. Memory and ability to understand (cognition). Work and work  Astronomer. Reproductive health. Screening  You may have the following tests or measurements: Height, weight, and BMI. Blood pressure. Lipid and cholesterol levels. These may be checked every 5 years, or more frequently if you are over 54 years old. Skin check. Lung cancer screening. You may have this screening every year starting at age 61 if you have a 30-pack-year history of smoking and currently smoke or have quit within the past 15 years. Fecal occult blood test (FOBT) of the stool. You may have this test every year starting at age 59. Flexible sigmoidoscopy or colonoscopy. You may have a sigmoidoscopy every 5 years or a colonoscopy every 10 years starting at age 40. Hepatitis C blood test. Hepatitis B blood test. Sexually transmitted disease (STD) testing. Diabetes screening. This is done by checking your blood sugar (glucose) after you have not eaten for a while (fasting). You may have this done every 1-3 years. Bone density scan. This is done to screen for osteoporosis. You may have this done starting at age 73. Mammogram. This may be done every 1-2 years. Talk to your health care provider about how often you should have regular mammograms. Talk with your health care provider about your test results, treatment options, and if necessary, the need for more tests. Vaccines  Your health care provider may recommend certain vaccines, such as: Influenza vaccine. This is recommended every year. Tetanus, diphtheria, and acellular pertussis (Tdap, Td) vaccine. You may need a Td booster every 10 years. Zoster vaccine. You may need this after age 77. Pneumococcal 13-valent conjugate (PCV13) vaccine. One dose is recommended after age 13. Pneumococcal polysaccharide (PPSV23) vaccine. One dose is recommended after age 86. Talk to your health care provider about which screenings and vaccines you need and how  often you need them. This information is not intended to replace advice given to you by  your health care provider. Make sure you discuss any questions you have with your health care provider. Document Released: 06/24/2015 Document Revised: 02/15/2016 Document Reviewed: 03/29/2015 Elsevier Interactive Patient Education  2017 [employer].  Fall Prevention in the Home Falls can cause injuries. They can happen to people of all ages. There are many things you can do to make your home safe and to help prevent falls. What can I do on the outside of my home? Regularly fix the edges of walkways and driveways and fix any cracks. Remove anything that might make you trip as you walk through a door, such as a raised step or threshold. Trim any bushes or trees on the path to your home. Use bright outdoor lighting. Clear any walking paths of anything that might make someone trip, such as rocks or tools. Regularly check to see if handrails are loose or broken. Make sure that both sides of any steps have handrails. Any raised decks and porches should have guardrails on the edges. Have any leaves, snow, or ice cleared regularly. Use sand or salt on walking paths during winter. Clean up any spills in your garage right away. This includes oil or grease spills. What can I do in the bathroom? Use night lights. Install grab bars by the toilet and in the tub and shower. Do not use towel bars as grab bars. Use non-skid mats or decals in the tub or shower. If you need to sit down in the shower, use a plastic, non-slip stool. Keep the floor dry. Clean up any water that spills on the floor as soon as it happens. Remove soap buildup in the tub or shower regularly. Attach bath mats securely with double-sided non-slip rug tape. Do not have throw rugs and other things on the floor that can make you trip. What can I do in the bedroom? Use night lights. Make sure that you have a light by your bed that is easy to reach. Do not use any sheets or blankets that are too big for your bed. They should not hang  down onto the floor. Have a firm chair that has side arms. You can use this for support while you get dressed. Do not have throw rugs and other things on the floor that can make you trip. What can I do in the kitchen? Clean up any spills right away. Avoid walking on wet floors. Keep items that you use a lot in easy-to-reach places. If you need to reach something above you, use a strong step stool that has a grab bar. Keep electrical cords out of the way. Do not use floor polish or wax that makes floors slippery. If you must use wax, use non-skid floor wax. Do not have throw rugs and other things on the floor that can make you trip. What can I do with my stairs? Do not leave any items on the stairs. Make sure that there are handrails on both sides of the stairs and use them. Fix handrails that are broken or loose. Make sure that handrails are as long as the stairways. Check any carpeting to make sure that it is firmly attached to the stairs. Fix any carpet that is loose or worn. Avoid having throw rugs at the top or bottom of the stairs. If you do have throw rugs, attach them to the floor with carpet tape. Make sure that you have a  light switch at the top of the stairs and the bottom of the stairs. If you do not have them, ask someone to add them for you. What else can I do to help prevent falls? Wear shoes that: Do not have high heels. Have rubber bottoms. Are comfortable and fit you well. Are closed at the toe. Do not wear sandals. If you use a stepladder: Make sure that it is fully opened. Do not climb a closed stepladder. Make sure that both sides of the stepladder are locked into place. Ask someone to hold it for you, if possible. Clearly mark and make sure that you can see: Any grab bars or handrails. First and last steps. Where the edge of each step is. Use tools that help you move around (mobility aids) if they are needed. These  include: Canes. Walkers. Scooters. Crutches. Turn on the lights when you go into a dark area. Replace any light bulbs as soon as they burn out. Set up your furniture so you have a clear path. Avoid moving your furniture around. If any of your floors are uneven, fix them. If there are any pets around you, be aware of where they are. Review your medicines with your doctor. Some medicines can make you feel dizzy. This can increase your chance of falling. Ask your doctor what other things that you can do to help prevent falls. This information is not intended to replace advice given to you by your health care provider. Make sure you discuss any questions you have with your health care provider. Document Released: 03/24/2009 Document Revised: 11/03/2015 Document Reviewed: 07/02/2014 Elsevier Interactive Patient Education  2017 [employer].

## 2022-12-19 NOTE — Progress Notes (Signed)
Subjective:   Angela Estrada is a 72 y.o. female who presents for Medicare Annual (Subsequent) preventive examination.  Visit Complete: Virtual  I connected with  Angela Estrada on 12/19/22 by a audio enabled telemedicine application and verified that I am speaking with the correct person using two identifiers.  Patient Location: Home  Provider Location: Home Office  I discussed the limitations of evaluation and management by telemedicine. The patient expressed understanding and agreed to proceed.   Review of Systems     Cardiac Risk Factors include: advanced age (>28men, >61 women);hypertension;family history of premature cardiovascular disease;obesity (BMI >30kg/m2)     Objective:    Today's Vitals   There is no height or weight on file to calculate BMI.     12/19/2022    3:40 PM 03/27/2021   12:47 PM 07/27/2020    7:23 PM 06/15/2020   11:29 AM 05/23/2020    3:46 PM 05/19/2020   11:14 AM 02/10/2020   11:04 AM  Advanced Directives  Does Patient Have a Medical Advance Directive? No No No Yes Yes Yes No  Type of [employer] [place];Living will Healthcare Power of [place];Living will   Does patient want to make changes to medical advance directive?    No - Patient declined No - Patient declined    Copy of Healthcare Power of Attorney in Chart?    No - copy requested No - copy requested    Would patient like information on creating a medical advance directive? No - Patient declined No - Patient declined No - Patient declined    No - Patient declined    Current Medications (verified) Outpatient Encounter Medications as of 12/19/2022  Medication Sig   amLODipine (NORVASC) 2.5 MG tablet TAKE 1 TABLET BY MOUTH EVERY DAY   Apoaequorin (PREVAGEN) 10 MG CAPS Take 10 mg by mouth daily.   aspirin EC 81 MG tablet Take 1 tablet (81 mg total) by mouth 2 (two) times daily.   Carboxymethylcellul-Glycerin (LUBRICATING EYE  DROPS OP) Place 1 drop into both eyes daily as needed (dry eyes).   cetirizine-pseudoephedrine (ZYRTEC-D) 5-120 MG tablet Take 1 tablet by mouth daily as needed for allergies.   famotidine (PEPCID) 20 MG tablet TAKE 1 TABLET BY MOUTH TWICE A DAY   KLOR-CON M20 20 MEQ tablet TAKE 1 TABLET BY MOUTH TWICE A DAY   metoprolol succinate (TOPROL-XL) 25 MG 24 hr tablet Take 25 mg by mouth daily.   Multiple Vitamin (MULTIVITAMIN WITH MINERALS) TABS tablet Take 1 tablet by mouth daily.   oxybutynin (DITROPAN) 5 MG tablet TAKE 1 TABLET BY MOUTH THREE TIMES A DAY   pantoprazole (PROTONIX) 40 MG tablet TAKE 1 TABLET BY MOUTH EVERY DAY   pravastatin (PRAVACHOL) 20 MG tablet TAKE 1 TABLET BY MOUTH EVERY DAY IN THE EVENING   sucralfate (CARAFATE) 1 g tablet Take 1 tablet (1 g total) by mouth 4 (four) times daily -  with meals and at bedtime.   telmisartan-hydrochlorothiazide (MICARDIS HCT) 80-25 MG tablet TAKE 1 TABLET BY MOUTH EVERY DAY   No facility-administered encounter medications on file as of 12/19/2022.    Allergies (verified) Lisinopril and Oxycodone   History: Past Medical History:  Diagnosis Date   Abdominal hernia    RLQ   Allergy    Arthritis    Arthritis    Borderline diabetes    GERD (gastroesophageal reflux disease)    HYPERCHOLESTEROLEMIA 04/25/2007  takes Pravastatin daily   HYPERTENSION 01/03/2007   takes Losartan-HCTZ  and Amlodipine daily   Hypokalemia    takes Potassium daily   Joint pain    Nocturia    Pneumonia 2 yrs ago   Primary osteoarthritis of shoulder    Right   Past Surgical History:  Procedure Laterality Date   CESAREAN SECTION  25 years ago   CHOLECYSTECTOMY     COLONOSCOPY     DILATION AND CURETTAGE OF UTERUS  35 years ago   HERNIA REPAIR     INCISIONAL HERNIA REPAIR  02/21/2012   Procedure: LAPAROSCOPIC INCISIONAL HERNIA;  Surgeon: Shelly Rubenstein, MD;  Location: [facility] OR;  Service: General;  Laterality: N/A;  laparoscopic incisional hernia repair with  mesh   JOINT REPLACEMENT     bilateral knee replacements   REPLACEMENT TOTAL KNEE BILATERAL  2010   TOTAL HIP ARTHROPLASTY Right 03/19/2016   Procedure: RIGHT TOTAL HIP ARTHROPLASTY ANTERIOR APPROACH;  Surgeon: Gean Birchwood, MD;  Location: [facility] OR;  Service: Orthopedics;  Laterality: Right;   TOTAL HIP REVISION Right 10/02/2017   Procedure: TOTAL HIP REVISION POSTERIOR;  Surgeon: Gean Birchwood, MD;  Location: [facility] OR;  Service: Orthopedics;  Laterality: Right;   TOTAL HIP REVISION Right 05/23/2020   Procedure: RIGHT TOTAL POSTERIOR HIP REVISION;  Surgeon: Gean Birchwood, MD;  Location: [facility];  Service: Orthopedics;  Laterality: Right;   TOTAL SHOULDER ARTHROPLASTY Right 12/19/2017   Procedure: RIGHT TOTAL SHOULDER ARTHROPLASTY;  Surgeon: Jones Broom, MD;  Location: [facility];  Service: Orthopedics;  Laterality: Right;   TUBAL LIGATION     Family History  Problem Relation Age of Onset   Diabetes Mother    Heart disease Mother    Hypertension Mother    Stroke Mother    Diabetes Father    Heart disease Father        Triple Bypass   Hypertension Father    Heart attack Brother    Congestive Heart Failure Brother    Hypertension Brother    Heart disease Brother    Hypertension Sister    Hypertension Sister    Cancer Sister        uterine   Hypertension Sister    Hypertension Sister    Hypertension Sister    Hypertension Sister    Cancer Sister        uterine   Colon cancer Neg Hx    Social History   Socioeconomic History   Marital status: Married    Spouse name: Not on file   Number of children: 1   Years of education: 12th grade +   Highest education level: Not on file  Occupational History   Occupation: Beautician  Tobacco Use   Smoking status: Never   Smokeless tobacco: Never  Vaping Use   Vaping Use: Never used  Substance and Sexual Activity   Alcohol use: No    Alcohol/week: 0.0 standard drinks of alcohol   Drug use: No   Sexual activity: Not Currently    Partners:  Male    Birth control/protection: Post-menopausal    Comment: first intercourse >76 y.o  Other Topics Concern   Not on file  Social History Narrative   Lives with her husband.   Adult daughter lives nearby.   Social Determinants of Health   Financial Resource Strain: Low Risk  (12/19/2022)   Overall Financial Resource Strain (CARDIA)    Difficulty of Paying Living Expenses: Not hard at all  Food Insecurity: No Food  Insecurity (12/19/2022)   Hunger Vital Sign    Worried About Running Out of Food in the Last Year: Never true    Ran Out of Food in the Last Year: Never true  Transportation Needs: No Transportation Needs (12/19/2022)   PRAPARE - Administrator, Civil Service (Medical): No    Lack of Transportation (Non-Medical): No  Physical Activity: Sufficiently Active (12/19/2022)   Exercise Vital Sign    Days of Exercise per Week: 4 days    Minutes of Exercise per Session: 40 min  Stress: No Stress Concern Present (12/19/2022)   [employer] of Occupational Health - Occupational Stress Questionnaire    Feeling of Stress : Not at all  Social Connections: Socially Integrated (12/19/2022)   Social Connection and Isolation Panel [NHANES]    Frequency of Communication with Friends and Family: More than three times a week    Frequency of Social Gatherings with Friends and Family: Never    Attends Religious Services: More than 4 times per year    Active Member of [employer] or Organizations: Yes    Attends Engineer, structural: More than 4 times per year    Marital Status: Married    Tobacco Counseling Counseling given: Not Answered   Clinical Intake:  Pre-visit preparation completed: Yes  Pain : No/denies pain     Diabetes: No     Interpreter Needed?: No  Information entered by :: Remi Haggard LPN   Activities of Daily Living    12/19/2022    3:41 PM 03/29/2022    1:10 PM  In your present state of health, do you have any difficulty performing the  following activities:  Hearing? 0 0  Vision? 0 0  Difficulty concentrating or making decisions?  0  Walking or climbing stairs? 0 0  Dressing or bathing? 0 0  Doing errands, shopping? 0 0  Preparing Food and eating ? N N  Using the Toilet? N N  In the past six months, have you accidently leaked urine? N N  Do you have problems with loss of bowel control? N N  Managing your Medications? N N  Managing your Finances? N N  Housekeeping or managing your Housekeeping? N N    Patient Care Team: Shade Flood, MD as PCP - General (Family Medicine) Patton Salles, MD as Attending Physician (Obstetrics and Gynecology) Jones Broom, MD as Consulting Physician (Orthopedic Surgery) Gean Birchwood, MD as Consulting Physician (Orthopedic Surgery)  Indicate any recent Medical Services you may have received from other than [facility] providers in the past year (date may be approximate).     Assessment:   This is a routine wellness examination for [place].  Hearing/Vision screen Hearing Screening - Comments:: No trouble hearing Vision Screening - Comments:: Up to date Looking for a new one  Dietary issues and exercise activities discussed:     Goals Addressed             This Visit's Progress    Weight (lb) < 200 lb (90.7 kg)         Depression Screen    12/19/2022    3:45 PM 09/27/2022    3:51 PM 09/17/2022   11:05 AM 04/26/2022    9:56 AM 03/29/2022    1:09 PM 03/15/2022   10:32 AM 10/18/2021    1:46 PM  PHQ 2/9 Scores  PHQ - 2 Score 0 0 0 0 0 0 0  PHQ- 9 Score 0 0  0 0     Fall Risk    12/19/2022    3:40 PM 09/27/2022    3:51 PM 09/17/2022   11:05 AM 03/29/2022    1:07 PM 03/15/2022   10:30 AM  Fall Risk   Falls in the past year? 0 0 0 0 0  Number falls in past yr: 0  0 0 0  Injury with Fall? 0  0 0 0  Risk for fall due to :  No Fall Risks No Fall Risks No Fall Risks No Fall Risks  Follow up Falls evaluation completed;Education provided;Falls prevention  discussed   Falls prevention discussed Falls evaluation completed    MEDICARE RISK AT HOME:  Medicare Risk at Home - 12/19/22 1540     Any stairs in or around the home? Yes    If so, are there any without handrails? No    Home free of loose throw rugs in walkways, pet beds, electrical cords, etc? Yes    Adequate lighting in your home to reduce risk of falls? Yes    Life alert? No    Use of a cane, walker or w/c? Yes    Grab bars in the bathroom? Yes    Shower chair or bench in shower? Yes    Elevated toilet seat or a handicapped toilet? Yes             TIMED UP AND GO:  Was the test performed?  No    Cognitive Function:        12/19/2022    3:43 PM 03/29/2022    1:10 PM 02/10/2020   11:04 AM 02/03/2019    2:03 PM  6CIT Screen  What Year? 0 points 0 points 0 points 0 points  What month? 0 points 0 points 0 points 0 points  What time? 0 points 0 points 0 points 0 points  Count back from 20 0 points 0 points 0 points 0 points  Months in reverse 0 points 0 points 0 points 0 points  Repeat phrase 0 points 0 points 0 points 0 points  Total Score 0 points 0 points 0 points 0 points    Immunizations Immunization History  Administered Date(s) Administered   Fluad Quad(high Dose 65+) 02/10/2019, 03/14/2020, 05/24/2021   Influenza Split 05/20/2012   Influenza, High Dose Seasonal PF 03/13/2018, 03/21/2022   Influenza,inj,Quad PF,6+ Mos 02/11/2014, 03/01/2015, 03/21/2016, 04/02/2017   PFIZER(Purple Top)SARS-COV-2 Vaccination 08/04/2019, 08/25/2019, 05/14/2020   Pneumococcal Conjugate-13 11/01/2017   Pneumococcal Polysaccharide-23 03/21/2016, 05/24/2021   Td 08/04/2001   Tdap 10/02/2013   Zoster Recombinant(Shingrix) 01/21/2019, 09/17/2019   Zoster, Live 03/29/2014    TDAP status: Up to date  Flu Vaccine status: Up to date  Pneumococcal vaccine status: Up to date  Covid-19 vaccine status: Information provided on how to obtain vaccines.   Qualifies for Shingles  Vaccine? No   Zostavax completed Yes   Shingrix Completed?: Yes  Screening Tests Health Maintenance  Topic Date Due   COVID-19 Vaccine (4 - 2023-24 season) 01/04/2023 (Originally 02/09/2022)   INFLUENZA VACCINE  01/10/2023   MAMMOGRAM  02/01/2023   DTaP/Tdap/Td (3 - Td or Tdap) 10/03/2023   Medicare Annual Wellness (AWV)  12/19/2023   Colonoscopy  03/21/2027   Pneumonia Vaccine 71+ Years old  Completed   DEXA SCAN  Completed   Hepatitis C Screening  Completed   Zoster Vaccines- Shingrix  Completed   HPV VACCINES  Aged Out    Health Maintenance  There are no preventive care  reminders to display for this patient.   Colorectal cancer screening: Type of screening: Colonoscopy. Completed 2018. Repeat every 10 years  Mammogram status: Completed  . Repeat every year  Bone Density status: Completed 2020. Results reflect: Bone density results: NORMAL. Repeat every 3-5 years.  Lung Cancer Screening: (Low Dose CT Chest recommended if Age 70-80 years, 20 pack-year currently smoking OR have quit w/in 15years.) does not qualify.   Lung Cancer Screening Referral:   Additional Screening:  Hepatitis C Screening: does not qualify; Completed 2014   Vision Screening: Recommended annual ophthalmology exams for early detection of glaucoma and other disorders of the eye. Is the patient up to date with their annual eye exam?  Yes  Who is the provider or what is the name of the office in which the patient attends annual eye exams? Was shapario looking for a new Ophthalmology MD If pt is not established with a provider, would they like to be referred to a provider to establish care? No .   Dental Screening: Recommended annual dental exams for proper oral hygiene    Community Resource Referral / Chronic Care Management: CRR required this visit?  No   CCM required this visit?  No     Plan:     I have personally reviewed and noted the following in the patient's chart:   Medical and  social history Use of alcohol, tobacco or illicit drugs  Current medications and supplements including opioid prescriptions. Patient is not currently taking opioid prescriptions. Functional ability and status Nutritional status Physical activity Advanced directives List of other physicians Hospitalizations, surgeries, and ER visits in previous 12 months Vitals Screenings to include cognitive, depression, and falls Referrals and appointments  In addition, I have reviewed and discussed with patient certain preventive protocols, quality metrics, and best practice recommendations. A written personalized care plan for preventive services as well as general preventive health recommendations were provided to patient.     Remi Haggard, LPN   09/17/8117   After Visit Summary:  Nurse Notes:

## 2023-01-06 ENCOUNTER — Other Ambulatory Visit: Payer: Self-pay | Admitting: Family Medicine

## 2023-01-06 DIAGNOSIS — N3941 Urge incontinence: Secondary | ICD-10-CM

## 2023-01-11 ENCOUNTER — Other Ambulatory Visit: Payer: Self-pay | Admitting: Family Medicine

## 2023-01-11 DIAGNOSIS — I10 Essential (primary) hypertension: Secondary | ICD-10-CM

## 2023-01-21 DIAGNOSIS — R7303 Prediabetes: Secondary | ICD-10-CM | POA: Diagnosis not present

## 2023-01-21 DIAGNOSIS — E782 Mixed hyperlipidemia: Secondary | ICD-10-CM | POA: Diagnosis not present

## 2023-01-21 DIAGNOSIS — I10 Essential (primary) hypertension: Secondary | ICD-10-CM | POA: Diagnosis not present

## 2023-01-21 DIAGNOSIS — K219 Gastro-esophageal reflux disease without esophagitis: Secondary | ICD-10-CM | POA: Diagnosis not present

## 2023-01-25 ENCOUNTER — Other Ambulatory Visit: Payer: Self-pay | Admitting: Obstetrics and Gynecology

## 2023-01-25 DIAGNOSIS — Z1231 Encounter for screening mammogram for malignant neoplasm of breast: Secondary | ICD-10-CM

## 2023-01-30 ENCOUNTER — Ambulatory Visit: Payer: Medicare HMO | Admitting: Podiatry

## 2023-01-30 ENCOUNTER — Encounter: Payer: Self-pay | Admitting: Podiatry

## 2023-01-30 DIAGNOSIS — M76822 Posterior tibial tendinitis, left leg: Secondary | ICD-10-CM

## 2023-01-30 DIAGNOSIS — M7752 Other enthesopathy of left foot: Secondary | ICD-10-CM | POA: Diagnosis not present

## 2023-01-30 MED ORDER — TRIAMCINOLONE ACETONIDE 10 MG/ML IJ SUSP
10.0000 mg | Freq: Once | INTRAMUSCULAR | Status: AC
  Administered 2023-01-30: 10 mg via INTRA_ARTICULAR

## 2023-01-30 NOTE — Progress Notes (Signed)
Subjective:   Patient ID: Angela Estrada, female   DOB: 72 y.o.   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  I did do sterile prep and injected the sinus tarsi 3 mg Kenalog 5 mg Xylocaine to try to reduce inflammation

## 2023-02-08 ENCOUNTER — Ambulatory Visit
Admission: RE | Admit: 2023-02-08 | Discharge: 2023-02-08 | Disposition: A | Discharge status: Home / Self Care | Payer: Medicare HMO | Source: Ambulatory Visit | Attending: Obstetrics and Gynecology | Admitting: Obstetrics and Gynecology

## 2023-02-08 DIAGNOSIS — Z1231 Encounter for screening mammogram for malignant neoplasm of breast: Secondary | ICD-10-CM | POA: Diagnosis not present

## 2023-02-18 ENCOUNTER — Other Ambulatory Visit: Payer: Medicare HMO

## 2023-03-06 ENCOUNTER — Ambulatory Visit: Payer: Medicare HMO

## 2023-03-06 DIAGNOSIS — M76822 Posterior tibial tendinitis, left leg: Secondary | ICD-10-CM

## 2023-03-06 DIAGNOSIS — M7752 Other enthesopathy of left foot: Secondary | ICD-10-CM

## 2023-03-08 NOTE — Progress Notes (Signed)
9/25.2024 Patient was present and casted for new [place] AFO brace  Patient has a brace currently that is over 38years old is worn out and now causing pain as condition has worsened and brace is now ill-fitting  Patient has PTTD with severe over-pronation edema present as well  We will need to obtain prior auth before placing order for brace  Brace will increase support, reduce overpronation and help restore activities of daily living  Once we hear from INS I will let patient know  Angela Estrada Cped, CFo, CFm

## 2023-03-18 ENCOUNTER — Encounter: Payer: Self-pay | Admitting: Podiatry

## 2023-03-18 ENCOUNTER — Ambulatory Visit: Payer: Medicare HMO | Admitting: Podiatry

## 2023-03-18 DIAGNOSIS — M76822 Posterior tibial tendinitis, left leg: Secondary | ICD-10-CM | POA: Diagnosis not present

## 2023-03-18 DIAGNOSIS — B351 Tinea unguium: Secondary | ICD-10-CM | POA: Diagnosis not present

## 2023-03-18 DIAGNOSIS — M79674 Pain in right toe(s): Secondary | ICD-10-CM | POA: Diagnosis not present

## 2023-03-18 DIAGNOSIS — M79675 Pain in left toe(s): Secondary | ICD-10-CM | POA: Diagnosis not present

## 2023-03-18 NOTE — Progress Notes (Signed)
Subjective:   Patient ID: Angela Estrada, female   DOB: 72 y.o.   MRN: [id number]   HPI Patient presents with concerns about toenail disease with pain with the second right bothering her the most but all nails thickened and hard for them to cut and does have severe collapse medial longitudinal arch left has had a brace casting done in the last few weeks   ROS      Objective:  Physical Exam  Neurovascular status found to be intact muscle strength found to be adequate severe collapse medial longitudinal arch left with dysfunctioning posterior tibial tendon with patient found to have thick yellow brittle nailbeds 1-5 both feet that are painful when pressed     Assessment:  Mycotic nail infection with pain 1-5 both feet with collapse medial longitudinal arch left with posterior tibial tendinitis     Plan:  H&P reviewed both conditions.  I debrided nailbeds 1-5 both feet no angiogenic bleeding and for the left I discussed the collapse of the arch we will continue conservative until the brace comes back and she will wear good support shoes all questions answered today

## 2023-03-29 ENCOUNTER — Ambulatory Visit (INDEPENDENT_AMBULATORY_CARE_PROVIDER_SITE_OTHER): Payer: Medicare HMO | Admitting: Family Medicine

## 2023-03-29 ENCOUNTER — Encounter: Payer: Self-pay | Admitting: Family Medicine

## 2023-03-29 VITALS — BP 122/74 | HR 81 | Temp 97.6°F | Ht 62.0 in | Wt 227.4 lb

## 2023-03-29 DIAGNOSIS — R12 Heartburn: Secondary | ICD-10-CM

## 2023-03-29 DIAGNOSIS — N3941 Urge incontinence: Secondary | ICD-10-CM | POA: Diagnosis not present

## 2023-03-29 DIAGNOSIS — E7800 Pure hypercholesterolemia, unspecified: Secondary | ICD-10-CM

## 2023-03-29 DIAGNOSIS — R7303 Prediabetes: Secondary | ICD-10-CM

## 2023-03-29 DIAGNOSIS — I10 Essential (primary) hypertension: Secondary | ICD-10-CM | POA: Diagnosis not present

## 2023-03-29 LAB — COMPREHENSIVE METABOLIC PANEL
ALT: 21 U/L (ref 0–35)
AST: 29 U/L (ref 0–37)
Albumin: 4.4 g/dL (ref 3.5–5.2)
Alkaline Phosphatase: 62 U/L (ref 39–117)
BUN: 13 mg/dL (ref 6–23)
CO2: 32 meq/L (ref 19–32)
Calcium: 10.7 mg/dL — ABNORMAL HIGH (ref 8.4–10.5)
Chloride: 98 meq/L (ref 96–112)
Creatinine, Ser: 0.68 mg/dL (ref 0.40–1.20)
GFR: 88.08 mL/min (ref >60.00)
Glucose, Bld: 93 mg/dL (ref 70–99)
Potassium: 3.7 meq/L (ref 3.5–5.1)
Sodium: 139 meq/L (ref 135–145)
Total Bilirubin: 1 mg/dL (ref 0.2–1.2)
Total Protein: 8 g/dL (ref 6.0–8.3)

## 2023-03-29 LAB — LIPID PANEL
Cholesterol: 165 mg/dL (ref 0–200)
HDL: 69.3 mg/dL (ref >39.00)
LDL Cholesterol: 85 mg/dL (ref 0–99)
NonHDL: 96.17
Total CHOL/HDL Ratio: 2
Triglycerides: 58 mg/dL (ref 0.0–149.0)
VLDL: 11.6 mg/dL (ref 0.0–40.0)

## 2023-03-29 LAB — HEMOGLOBIN A1C: Hgb A1c MFr Bld: 6.3 % (ref 4.6–6.5)

## 2023-03-29 MED ORDER — AMLODIPINE BESYLATE 2.5 MG PO TABS: 2.5000 mg | ORAL_TABLET | Freq: Every day | ORAL | 1 refills | Status: DC

## 2023-03-29 MED ORDER — PRAVASTATIN SODIUM 20 MG PO TABS
20.0000 mg | ORAL_TABLET | Freq: Every day | ORAL | 3 refills | Status: DC
Start: 2023-03-29 — End: 2023-03-29

## 2023-03-29 MED ORDER — TELMISARTAN-HCTZ 80-25 MG PO TABS
1.0000 | ORAL_TABLET | Freq: Every day | ORAL | 1 refills | Status: DC
Start: 2023-03-29 — End: 2023-09-30

## 2023-03-29 NOTE — Patient Instructions (Signed)
Glad you are doing well.  No med changes at this time.  If any concerns on labs I will let you know.  I will make a note to send those to Dr. Sharyn Lull as well.  Let me know if you have questions.  Take care!

## 2023-03-29 NOTE — Progress Notes (Signed)
Subjective:  Patient ID: Angela Estrada, female    DOB: Jul 10, 1950  Age: 71 y.o. MRN: [id number]  CC:  Chief Complaint  Patient presents with   6 month f/u    HPI Angela Estrada presents for   Heartburn/abdominal pain Discussed in April, improved at that time.  Possible esophagitis versus gastritis, reassuring labs.  Quick resolution of symptoms, discontinued Carafate at that time with option of continuing of PPI and H2 blocker with tapering of those meds as tolerated. Pepcid, 1-2 times per day. Tried off for a few weeks, then heartburn returned, better back on meds. Stopped protonix. Doing ok with just pepcid.   Urge incontinence Stable with BID dosing ditropan. Tid if going out of town. No SE's, no dizziness. Slight dry mouth at times.   Prediabetes: Diet/exercise approach.  A1c did increase from 5.7-6.2 in April. Cutting back on tea. Few sodas per week. Avoiding fast food.  Exercise - [employer] walking, and strength training 5d/week.  Lab Results  Component Value Date   HGBA1C 6.2 09/17/2022  Body mass index is 41.59 kg/m. Wt Readings from Last 3 Encounters:  03/29/23 227 lb 6.4 oz (103.1 kg)  09/27/22 226 lb 3.2 oz (102.6 kg)  09/17/22 226 lb 3.2 oz (102.6 kg)   Hyperlipidemia: Pravastatin 20 mg daily without new side effects or myalgias.  Has raised to 40mg  at cardiology. Cardiology requested labs from today - Dr.  Sharyn Lull.  Lab Results  Component Value Date   CHOL 160 09/17/2022   HDL 59.20 09/17/2022   LDLCALC 83 09/17/2022   TRIG 90.0 09/17/2022   CHOLHDL 3 09/17/2022   Lab Results  Component Value Date   ALT 21 09/17/2022   AST 26 09/17/2022   ALKPHOS 58 09/17/2022   BILITOT 0.8 09/17/2022   Hypertension: Amlodipine 2.5 mg daily, Toprol-XL 25 mg daily, telmisartan HCTZ 80/25 mg daily with potassium 20 mill equivalents daily without new side effects, no missed doses. Home readings: None Some refills with cardiology - Dr. Sharyn Lull.  BP Readings from  Last 3 Encounters:  03/29/23 122/74  09/27/22 130/64  09/17/22 102/60   Lab Results  Component Value Date   CREATININE 0.71 09/17/2022   Health maintenance COVID booster last month at [facility].     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   Arthritis of right hip 03/19/2016   Primary osteoarthritis of right hip 03/18/2016   Hypokalemia 03/29/2014   Arthralgia 02/11/2014   Routine general medical examination at a health care facility 03/28/2013   Nonspecific abnormal findings on radiological and examination of lung field 03/27/2013   Cough 03/11/2013   Disorder of liver 03/09/2013   Pyuria 03/09/2013   Arthritis    Ventral hernia 01/18/2012   Encounter for long-term (current) use of other medications 07/05/2011   HYPERGLYCEMIA 08/12/2008   HYPERCHOLESTEROLEMIA 04/25/2007   DIZZINESS AND GIDDINESS 04/25/2007   Essential hypertension 01/03/2007   Allergic rhinitis 01/03/2007   Past Medical History:  Diagnosis Date   Abdominal hernia    RLQ   Allergy    Arthritis    Arthritis    Borderline diabetes    GERD (gastroesophageal reflux disease)    HYPERCHOLESTEROLEMIA 04/25/2007   takes Pravastatin daily   HYPERTENSION 01/03/2007   takes Losartan-HCTZ  and Amlodipine  daily   Hypokalemia    takes Potassium daily   Joint pain    Nocturia    Pneumonia 2 yrs ago   Primary osteoarthritis of shoulder    Right   Past Surgical History:  Procedure Laterality Date   CESAREAN SECTION  25 years ago   CHOLECYSTECTOMY     COLONOSCOPY     DILATION AND CURETTAGE OF UTERUS  35 years ago   HERNIA REPAIR     INCISIONAL HERNIA REPAIR  02/21/2012   Procedure: LAPAROSCOPIC INCISIONAL HERNIA;  Surgeon: Shelly Rubenstein, MD;  Location: [facility] OR;  Service: General;   Laterality: N/A;  laparoscopic incisional hernia repair with mesh   JOINT REPLACEMENT     bilateral knee replacements   REPLACEMENT TOTAL KNEE BILATERAL  2010   TOTAL HIP ARTHROPLASTY Right 03/19/2016   Procedure: RIGHT TOTAL HIP ARTHROPLASTY ANTERIOR APPROACH;  Surgeon: Gean Birchwood, MD;  Location: [facility] OR;  Service: Orthopedics;  Laterality: Right;   TOTAL HIP REVISION Right 10/02/2017   Procedure: TOTAL HIP REVISION POSTERIOR;  Surgeon: Gean Birchwood, MD;  Location: [facility] OR;  Service: Orthopedics;  Laterality: Right;   TOTAL HIP REVISION Right 05/23/2020   Procedure: RIGHT TOTAL POSTERIOR HIP REVISION;  Surgeon: Gean Birchwood, MD;  Location: [facility];  Service: Orthopedics;  Laterality: Right;   TOTAL SHOULDER ARTHROPLASTY Right 12/19/2017   Procedure: RIGHT TOTAL SHOULDER ARTHROPLASTY;  Surgeon: Jones Broom, MD;  Location: [facility];  Service: Orthopedics;  Laterality: Right;   TUBAL LIGATION     Allergies  Allergen Reactions   Lisinopril Cough   Oxycodone Other (See Comments)    Hallucinations   Prior to Admission medications   Medication Sig Start Date End Date Taking? Authorizing Provider  amLODipine (NORVASC) 2.5 MG tablet TAKE 1 TABLET BY MOUTH EVERY DAY 11/08/22  Yes Shade Flood, MD  Apoaequorin (PREVAGEN) 10 MG CAPS Take 10 mg by mouth daily.   Yes [provider]  aspirin EC 81 MG tablet Take 1 tablet (81 mg total) by mouth 2 (two) times daily. 05/23/20  Yes Allena Katz, PA-C  Carboxymethylcellul-Glycerin (LUBRICATING EYE DROPS OP) Place 1 drop into both eyes daily as needed (dry eyes).   Yes [provider]  cetirizine-pseudoephedrine (ZYRTEC-D) 5-120 MG tablet Take 1 tablet by mouth daily as needed for allergies.   Yes [provider]  famotidine (PEPCID) 20 MG tablet TAKE 1 TABLET BY MOUTH TWICE A DAY 01/14/23  Yes Shade Flood, MD  KLOR-CON M20 20 MEQ tablet TAKE 1 TABLET BY MOUTH TWICE A DAY 01/14/23  Yes Shade Flood, MD  metoprolol  succinate (TOPROL-XL) 25 MG 24 hr tablet Take 25 mg by mouth daily. 08/03/20  Yes [provider]  Multiple Vitamin (MULTIVITAMIN WITH MINERALS) TABS tablet Take 1 tablet by mouth daily.   Yes [provider]  oxybutynin (DITROPAN) 5 MG tablet TAKE 1 TABLET BY MOUTH THREE TIMES A DAY 01/07/23  Yes Shade Flood, MD  pantoprazole (PROTONIX) 40 MG tablet TAKE 1 TABLET BY MOUTH EVERY DAY 12/14/22  Yes Shade Flood, MD  pravastatin (PRAVACHOL) 20 MG tablet TAKE 1 TABLET BY MOUTH EVERY DAY IN THE EVENING 01/22/22  Yes Shade Flood, MD  sucralfate (CARAFATE) 1 g tablet Take 1 tablet (1 g total) by mouth 4 (four) times daily -  with meals and at bedtime. 09/17/22  Yes Shade Flood, MD  telmisartan-hydrochlorothiazide (MICARDIS HCT) 80-25 MG tablet TAKE 1 TABLET BY MOUTH EVERY DAY  11/08/22  Yes Shade Flood, MD   Social History   Socioeconomic History   Marital status: Married    Spouse name: Not on file   Number of children: 1   Years of education: 12th grade +   Highest education level: Not on file  Occupational History   Occupation: Beautician  Tobacco Use   Smoking status: Never   Smokeless tobacco: Never  Vaping Use   Vaping status: Never Used  Substance and Sexual Activity   Alcohol use: No    Alcohol/week: 0.0 standard drinks of alcohol   Drug use: No   Sexual activity: Not Currently    Partners: Male    Birth control/protection: Post-menopausal    Comment: first intercourse >57 y.o  Other Topics Concern   Not on file  Social History Narrative   Lives with her husband.   Adult daughter lives nearby.   Social Determinants of Health   Financial Resource Strain: Low Risk  (12/19/2022)   Overall Financial Resource Strain (CARDIA)    Difficulty of Paying Living Expenses: Not hard at all  Food Insecurity: No Food Insecurity (12/19/2022)   Hunger Vital Sign    Worried About Running Out of Food in the Last Year: Never true    Ran Out of Food in the  Last Year: Never true  Transportation Needs: No Transportation Needs (12/19/2022)   PRAPARE - Administrator, Civil Service (Medical): No    Lack of Transportation (Non-Medical): No  Physical Activity: Sufficiently Active (12/19/2022)   Exercise Vital Sign    Days of Exercise per Week: 4 days    Minutes of Exercise per Session: 40 min  Stress: No Stress Concern Present (12/19/2022)   [employer] of Occupational Health - Occupational Stress Questionnaire    Feeling of Stress : Not at all  Social Connections: Socially Integrated (12/19/2022)   Social Connection and Isolation Panel [NHANES]    Frequency of Communication with Friends and Family: More than three times a week    Frequency of Social Gatherings with Friends and Family: Never    Attends Religious Services: More than 4 times per year    Active Member of [employer] or Organizations: Yes    Attends Engineer, structural: More than 4 times per year    Marital Status: Married  Catering manager Violence: Not At Risk (12/19/2022)   Humiliation, Afraid, Rape, and Kick questionnaire    Fear of Current or Ex-Partner: No    Emotionally Abused: No    Physically Abused: No    Sexually Abused: No    Review of Systems  Constitutional:  Negative for fatigue and unexpected weight change.  Respiratory:  Negative for chest tightness and shortness of breath.   Cardiovascular:  Negative for chest pain, palpitations and leg swelling.  Gastrointestinal:  Negative for abdominal pain and blood in stool.  Neurological:  Negative for dizziness, syncope, light-headedness and headaches.     Objective:   Vitals:   03/29/23 1037  BP: 122/74  Pulse: 81  Temp: 97.6 F (36.4 C)  SpO2: 99%  Weight: 227 lb 6.4 oz (103.1 kg)  Height: 5\' 2"  (1.575 m)     Physical Exam Vitals reviewed.  Constitutional:      Appearance: Normal appearance. She is well-developed.  HENT:     Head: Normocephalic and atraumatic.  Eyes:      Conjunctiva/sclera: Conjunctivae normal.     Pupils: Pupils are equal, round, and reactive to light.  Neck:  Vascular: No carotid bruit.  Cardiovascular:     Rate and Rhythm: Normal rate and regular rhythm.     Heart sounds: Normal heart sounds.  Pulmonary:     Effort: Pulmonary effort is normal.     Breath sounds: Normal breath sounds.  Abdominal:     Palpations: Abdomen is soft. There is no pulsatile mass.     Tenderness: There is no abdominal tenderness.  Musculoskeletal:     Right lower leg: No edema.     Left lower leg: No edema.  Skin:    General: Skin is warm and dry.  Neurological:     Mental Status: She is alert and oriented to person, place, and time.  Psychiatric:        Mood and Affect: Mood normal.        Behavior: Behavior normal.        Assessment & Plan:  Angela Estrada is a 72 y.o. female . Prediabetes - Plan: Hemoglobin A1c  -Commended on exercise, continue to avoid sugar-containing beverages, check updated A1c and adjust plan accordingly.  Urge incontinence  -Stable with Ditropan, twice daily to 3 times daily dosing when needed.  Continue same.  RTC precautions if new side effects or incomplete control.  Essential hypertension - Plan: telmisartan-hydrochlorothiazide (MICARDIS HCT) 80-25 MG tablet, amLODipine (NORVASC) 2.5 MG tablet, Comprehensive metabolic panel  -Stable with current regimen, followed by cardiology as above.  Check updated labs and send labs to her cardiologist.  Heartburn  -Stable with Pepcid alone at this time with RTC precautions, option to restart PPI if needed.  Pure hypercholesterolemia - Plan: pravastatin (PRAVACHOL) 20 MG tablet, Lipid panel  -Now on higher dose pravastatin per cardiology, 40 mg, check updated labs and copy of labs to her cardiologist.  Meds ordered this encounter  Medications   telmisartan-hydrochlorothiazide (MICARDIS HCT) 80-25 MG tablet    Sig: Take 1 tablet by mouth daily.    Dispense:  90  tablet    Refill:  1   pravastatin (PRAVACHOL) 20 MG tablet    Sig: Take 1 tablet (20 mg total) by mouth daily.    Dispense:  90 tablet    Refill:  3   amLODipine (NORVASC) 2.5 MG tablet    Sig: Take 1 tablet (2.5 mg total) by mouth daily.    Dispense:  90 tablet    Refill:  1   Patient Instructions  Glad you are doing well.  No med changes at this time.  If any concerns on labs I will let you know.  I will make a note to send those to Dr. Sharyn Lull as well.  Let me know if you have questions.  Take care!    Signed,   Meredith Staggers, MD [facility], [facility], [facility] 03/29/23 11:37 AM

## 2023-04-08 NOTE — Progress Notes (Signed)
Prior auth received approval for [employer] number / [id number] Scanned cast today and placed order  Addison Bailey Cped, CFo, CFm

## 2023-04-18 ENCOUNTER — Ambulatory Visit: Payer: Medicare HMO

## 2023-04-18 ENCOUNTER — Encounter: Payer: Self-pay | Admitting: Podiatry

## 2023-04-18 ENCOUNTER — Ambulatory Visit: Payer: Medicare HMO | Admitting: Podiatry

## 2023-04-18 VITALS — Ht 62.0 in | Wt 227.4 lb

## 2023-04-18 DIAGNOSIS — M7752 Other enthesopathy of left foot: Secondary | ICD-10-CM

## 2023-04-18 DIAGNOSIS — M779 Enthesopathy, unspecified: Secondary | ICD-10-CM | POA: Diagnosis not present

## 2023-04-18 MED ORDER — TRIAMCINOLONE ACETONIDE 10 MG/ML IJ SUSP
10.0000 mg | Freq: Once | INTRAMUSCULAR | Status: AC
  Administered 2023-04-18: 10 mg via INTRA_ARTICULAR

## 2023-04-19 NOTE — Progress Notes (Signed)
Subjective:   Patient ID: Angela Estrada, female   DOB: 72 y.o.   MRN: [id number]   HPI Patient states has developed pain in the outside of the left ankle stating it was improved for quite a while and is just reoccurred   ROS      Objective:  Physical Exam  Neurovascular status intact inflammation around the sinus tarsi left fluid buildup pain     Assessment:  Capsulitis of the left sinus tarsi fluid buildup     Plan:  H&P reviewed I did go ahead today I did sterile prep injected the sinus tarsi left 3 mg Kenalog 5 mg Xylocaine applied sterile dressing reappoint to recheck

## 2023-04-30 ENCOUNTER — Ambulatory Visit (INDEPENDENT_AMBULATORY_CARE_PROVIDER_SITE_OTHER): Payer: Medicare HMO

## 2023-04-30 DIAGNOSIS — M76822 Posterior tibial tendinitis, left leg: Secondary | ICD-10-CM | POA: Diagnosis not present

## 2023-04-30 DIAGNOSIS — M7752 Other enthesopathy of left foot: Secondary | ICD-10-CM | POA: Diagnosis not present

## 2023-04-30 DIAGNOSIS — M779 Enthesopathy, unspecified: Secondary | ICD-10-CM

## 2023-04-30 NOTE — Progress Notes (Signed)
Patient presents today to pick up custom molded AFO, diagnosed with Over pronation and PTTD, OA Left foot / Ankle   Orthosis was dispensed and fit was good patient was very happy with function. Brace adds support and stability to ankle and will also help to slow progression of ankle deformity.  Patient reviewed with me instructions for break-in and wear. Written instructions given to patient.  Patient will follow up as needed. Addison Bailey Cped, CFo, CFm

## 2023-05-27 DIAGNOSIS — R7303 Prediabetes: Secondary | ICD-10-CM | POA: Diagnosis not present

## 2023-05-27 DIAGNOSIS — E782 Mixed hyperlipidemia: Secondary | ICD-10-CM | POA: Diagnosis not present

## 2023-05-27 DIAGNOSIS — K219 Gastro-esophageal reflux disease without esophagitis: Secondary | ICD-10-CM | POA: Diagnosis not present

## 2023-05-27 DIAGNOSIS — I10 Essential (primary) hypertension: Secondary | ICD-10-CM | POA: Diagnosis not present

## 2023-06-22 ENCOUNTER — Other Ambulatory Visit: Payer: Self-pay | Admitting: Family Medicine

## 2023-06-22 DIAGNOSIS — R1310 Dysphagia, unspecified: Secondary | ICD-10-CM

## 2023-06-22 DIAGNOSIS — R12 Heartburn: Secondary | ICD-10-CM

## 2023-07-06 ENCOUNTER — Other Ambulatory Visit: Payer: Self-pay | Admitting: Family Medicine

## 2023-07-06 DIAGNOSIS — N3941 Urge incontinence: Secondary | ICD-10-CM

## 2023-08-09 NOTE — Progress Notes (Signed)
 73 y.o. G33P1001 Married [place] American female here for annual exam.    No vaginal bleeding or vaginal discharge.   Taking oxybutynin for overactive bladder.  Works well.   Regular bowel function.   Not sexually active.   Using a cane today.   PCP: Shade Flood, MD   Patient's last menstrual period was 08/30/1998 (approximate).           Sexually active: No.  The current method of family planning is post menopausal status.    Menopausal hormone therapy:  none Exercising: Yes.     [employer] 3 times a week Smoker:  no  OB History  Gravida Para Term Preterm AB Living  1 1 1   1   SAB IAB Ectopic Multiple Live Births      1    # Outcome Date GA Lbr Len/2nd Weight Sex Type Anes PTL Lv  1 Term 01/05/88 [redacted]w[redacted]d  7 lb 11 oz (3.487 kg) F CS-Unspec   LIV     HEALTH MAINTENANCE: Last 2 paps:  06-19-21 neg, 11-24-18 neg, transformation zone absent History of abnormal Pap or positive HPV:  yes Mammogram:   02-08-23 category b density birads 1:neg Colonoscopy:  03-20-17 - due in 2028.  Bone Density:  02-02-19  Result  normal   Immunization History  Administered Date(s) Administered   Fluad Quad(high Dose 65+) 02/10/2019, 03/14/2020, 05/24/2021   Influenza Split 05/20/2012   Influenza, High Dose Seasonal PF 03/13/2018, 03/21/2022, 02/24/2023   Influenza,inj,Quad PF,6+ Mos 02/11/2014, 03/01/2015, 03/21/2016, 04/02/2017   PFIZER(Purple Top)SARS-COV-2 Vaccination 08/04/2019, 08/25/2019, 05/14/2020   Pneumococcal Conjugate-13 11/01/2017   Pneumococcal Polysaccharide-23 03/21/2016, 05/24/2021   Td 08/04/2001   Tdap 10/02/2013   Zoster Recombinant(Shingrix) 01/21/2019, 09/17/2019   Zoster, Live 03/29/2014      reports that she has never smoked. She has never used smokeless tobacco. She reports that she does not drink alcohol and does not use drugs.  Past Medical History:  Diagnosis Date   Abdominal hernia    RLQ   Allergy    Arthritis    Arthritis    Borderline diabetes     GERD (gastroesophageal reflux disease)    HYPERCHOLESTEROLEMIA 04/25/2007   takes Pravastatin daily   HYPERTENSION 01/03/2007   takes Losartan-HCTZ  and Amlodipine daily   Hypokalemia    takes Potassium daily   Joint pain    Nocturia    Pneumonia 2 yrs ago   Primary osteoarthritis of shoulder    Right    Past Surgical History:  Procedure Laterality Date   CESAREAN SECTION  25 years ago   CHOLECYSTECTOMY     COLONOSCOPY     DILATION AND CURETTAGE OF UTERUS  35 years ago   HERNIA REPAIR     INCISIONAL HERNIA REPAIR  02/21/2012   Procedure: LAPAROSCOPIC INCISIONAL HERNIA;  Surgeon: Shelly Rubenstein, MD;  Location: [facility] OR;  Service: General;  Laterality: N/A;  laparoscopic incisional hernia repair with mesh   JOINT REPLACEMENT     bilateral knee replacements   REPLACEMENT TOTAL KNEE BILATERAL  2010   TOTAL HIP ARTHROPLASTY Right 03/19/2016   Procedure: RIGHT TOTAL HIP ARTHROPLASTY ANTERIOR APPROACH;  Surgeon: Gean Birchwood, MD;  Location: [facility];  Service: Orthopedics;  Laterality: Right;   TOTAL HIP REVISION Right 10/02/2017   Procedure: TOTAL HIP REVISION POSTERIOR;  Surgeon: Gean Birchwood, MD;  Location: [facility];  Service: Orthopedics;  Laterality: Right;   TOTAL HIP REVISION Right 05/23/2020   Procedure: RIGHT TOTAL POSTERIOR HIP  REVISION;  Surgeon: Gean Birchwood, MD;  Location: [facility];  Service: Orthopedics;  Laterality: Right;   TOTAL SHOULDER ARTHROPLASTY Right 12/19/2017   Procedure: RIGHT TOTAL SHOULDER ARTHROPLASTY;  Surgeon: Jones Broom, MD;  Location: [facility];  Service: Orthopedics;  Laterality: Right;   TUBAL LIGATION      Current Outpatient Medications  Medication Sig Dispense Refill   amLODipine (NORVASC) 2.5 MG tablet Take 1 tablet (2.5 mg total) by mouth daily. 90 tablet 1   Apoaequorin (PREVAGEN) 10 MG CAPS Take 10 mg by mouth daily.     aspirin EC 81 MG tablet Take 1 tablet (81 mg total) by mouth 2 (two) times daily. 60 tablet 0   Carboxymethylcellul-Glycerin  (LUBRICATING EYE DROPS OP) Place 1 drop into both eyes daily as needed (dry eyes).     cetirizine-pseudoephedrine (ZYRTEC-D) 5-120 MG tablet Take 1 tablet by mouth daily as needed for allergies.     famotidine (PEPCID) 20 MG tablet TAKE 1 TABLET BY MOUTH TWICE A DAY 180 tablet 1   KLOR-CON M20 20 MEQ tablet TAKE 1 TABLET BY MOUTH TWICE A DAY 180 tablet 1   metoprolol succinate (TOPROL-XL) 25 MG 24 hr tablet Take 25 mg by mouth daily.     Multiple Vitamin (MULTIVITAMIN WITH MINERALS) TABS tablet Take 1 tablet by mouth daily.     oxybutynin (DITROPAN) 5 MG tablet TAKE 1 TABLET BY MOUTH THREE TIMES A DAY 270 tablet 1   pravastatin (PRAVACHOL) 40 MG tablet Take 40 mg by mouth at bedtime.     sucralfate (CARAFATE) 1 g tablet Take 1 tablet (1 g total) by mouth 4 (four) times daily -  with meals and at bedtime. 40 tablet 0   telmisartan-hydrochlorothiazide (MICARDIS HCT) 80-25 MG tablet Take 1 tablet by mouth daily. 90 tablet 1   No current facility-administered medications for this visit.    ALLERGIES: Lisinopril and Oxycodone  Family History  Problem Relation Age of Onset   Diabetes Mother    Heart disease Mother    Hypertension Mother    Stroke Mother    Diabetes Father    Heart disease Father        Triple Bypass   Hypertension Father    Hypertension Sister    Hypertension Sister    Cancer Sister        uterine   Hypertension Sister    Hypertension Sister    Hypertension Sister    Hypertension Sister    Cancer Sister        uterine   Heart attack Brother    Congestive Heart Failure Brother    Hypertension Brother    Heart disease Brother    Colon cancer Neg Hx    Breast cancer Neg Hx     Review of Systems  Constitutional: Negative.   HENT: Negative.    Eyes: Negative.   Respiratory: Negative.    Cardiovascular: Negative.   Gastrointestinal: Negative.   Endocrine: Negative.   Genitourinary: Negative.   Musculoskeletal: Negative.   Skin: Negative.    Allergic/Immunologic: Negative.   Neurological: Negative.   Hematological: Negative.   Psychiatric/Behavioral: Negative.      PHYSICAL EXAM:  BP 118/68   Pulse 76   Ht 5' 1.25" (1.556 m)   Wt 221 lb (100.2 kg)   LMP 08/30/1998 (Approximate)   SpO2 98%   BMI 41.42 kg/m     General appearance: alert, cooperative and appears stated age Head: normocephalic, without obvious abnormality, atraumatic Neck: no adenopathy, supple,  symmetrical, trachea midline and thyroid normal to inspection and palpation Lungs: clear to auscultation bilaterally Breasts: normal appearance, no masses or tenderness, No nipple retraction or dimpling, No nipple discharge or bleeding, No axillary adenopathy Heart: regular rate and rhythm Abdomen: soft, non-tender; no masses, no organomegaly Extremities: extremities normal, atraumatic, no cyanosis or edema Skin: skin color, texture, turgor normal. No rashes or lesions Lymph nodes: cervical, supraclavicular, and axillary nodes normal. Neurologic: grossly normal  Pelvic: External genitalia:  no lesions              No abnormal inguinal nodes palpated.              Urethra:  normal appearing urethra with no masses, tenderness or lesions              Bartholins and Skenes: normal                 Vagina: normal appearing vagina with normal color and discharge, no lesions              Cervix: no lesions              Pap taken: yes Bimanual Exam:  Uterus:  normal size, contour, position, consistency, mobility, non-tender              Adnexa: no mass, fullness, tenderness              Rectal exam: yes.  Confirms.              Anus:  normal sphincter tone, no lesions  Chaperone was present for exam:  Renee Rival, CMA  ASSESSMENT: Encounter for breast and pelvic exam.  Personal history of other risk factors, not elsewhere classified.  Fibroids including 4.8 cm right pedunculated fibroids.  FH gynecologic cancer of unknown type. Overactive bladder. PCP  managing.  PLAN: Mammogram screening discussed. Self breast awareness reviewed. Pap and reflex HRV collected:  yes Guidelines for Calcium, Vitamin D, regular exercise program including cardiovascular and weight bearing exercise. Medication refills:  NA Labs with PCP.  Follow up:   in 2 years per patient preference.  She will also follow up as needed.

## 2023-08-14 ENCOUNTER — Other Ambulatory Visit (HOSPITAL_COMMUNITY)
Admission: RE | Admit: 2023-08-14 | Discharge: 2023-08-14 | Disposition: A | Discharge status: Home / Self Care | Source: Ambulatory Visit | Attending: Obstetrics and Gynecology | Admitting: Obstetrics and Gynecology

## 2023-08-14 ENCOUNTER — Encounter: Payer: Self-pay | Admitting: Obstetrics and Gynecology

## 2023-08-14 ENCOUNTER — Ambulatory Visit (INDEPENDENT_AMBULATORY_CARE_PROVIDER_SITE_OTHER): Payer: Medicare HMO | Admitting: Obstetrics and Gynecology

## 2023-08-14 VITALS — BP 118/68 | HR 76 | Ht 61.25 in | Wt 221.0 lb

## 2023-08-14 DIAGNOSIS — Z124 Encounter for screening for malignant neoplasm of cervix: Secondary | ICD-10-CM | POA: Insufficient documentation

## 2023-08-14 DIAGNOSIS — Z9189 Other specified personal risk factors, not elsewhere classified: Secondary | ICD-10-CM | POA: Diagnosis not present

## 2023-08-14 DIAGNOSIS — D219 Benign neoplasm of connective and other soft tissue, unspecified: Secondary | ICD-10-CM | POA: Diagnosis not present

## 2023-08-14 DIAGNOSIS — Z01419 Encounter for gynecological examination (general) (routine) without abnormal findings: Secondary | ICD-10-CM

## 2023-08-14 NOTE — Patient Instructions (Signed)

## 2023-08-20 ENCOUNTER — Encounter: Payer: Self-pay | Admitting: Obstetrics and Gynecology

## 2023-08-20 LAB — CYTOLOGY - PAP: Diagnosis: NEGATIVE

## 2023-09-23 DIAGNOSIS — I10 Essential (primary) hypertension: Secondary | ICD-10-CM | POA: Diagnosis not present

## 2023-09-23 DIAGNOSIS — K219 Gastro-esophageal reflux disease without esophagitis: Secondary | ICD-10-CM | POA: Diagnosis not present

## 2023-09-23 DIAGNOSIS — R7303 Prediabetes: Secondary | ICD-10-CM | POA: Diagnosis not present

## 2023-09-23 DIAGNOSIS — E782 Mixed hyperlipidemia: Secondary | ICD-10-CM | POA: Diagnosis not present

## 2023-09-29 ENCOUNTER — Other Ambulatory Visit: Payer: Self-pay | Admitting: Family Medicine

## 2023-09-29 DIAGNOSIS — I10 Essential (primary) hypertension: Secondary | ICD-10-CM

## 2023-09-30 ENCOUNTER — Encounter: Payer: Self-pay | Admitting: Family Medicine

## 2023-09-30 ENCOUNTER — Ambulatory Visit (INDEPENDENT_AMBULATORY_CARE_PROVIDER_SITE_OTHER): Payer: Medicare HMO | Admitting: Family Medicine

## 2023-09-30 VITALS — BP 128/74 | HR 73 | Temp 97.8°F | Ht 62.0 in | Wt 220.2 lb

## 2023-09-30 DIAGNOSIS — Z0000 Encounter for general adult medical examination without abnormal findings: Secondary | ICD-10-CM

## 2023-09-30 DIAGNOSIS — N3941 Urge incontinence: Secondary | ICD-10-CM | POA: Diagnosis not present

## 2023-09-30 DIAGNOSIS — E7800 Pure hypercholesterolemia, unspecified: Secondary | ICD-10-CM

## 2023-09-30 DIAGNOSIS — R7303 Prediabetes: Secondary | ICD-10-CM | POA: Diagnosis not present

## 2023-09-30 DIAGNOSIS — I10 Essential (primary) hypertension: Secondary | ICD-10-CM

## 2023-09-30 LAB — LIPID PANEL
Cholesterol: 139 mg/dL (ref 0–200)
HDL: 57.8 mg/dL (ref >39.00)
LDL Cholesterol: 69 mg/dL (ref 0–99)
NonHDL: 81.27
Total CHOL/HDL Ratio: 2
Triglycerides: 60 mg/dL (ref 0.0–149.0)
VLDL: 12 mg/dL (ref 0.0–40.0)

## 2023-09-30 LAB — COMPREHENSIVE METABOLIC PANEL WITH GFR
ALT: 19 U/L (ref 0–35)
AST: 22 U/L (ref 0–37)
Albumin: 4.2 g/dL (ref 3.5–5.2)
Alkaline Phosphatase: 60 U/L (ref 39–117)
BUN: 12 mg/dL (ref 6–23)
CO2: 31 meq/L (ref 19–32)
Calcium: 10.1 mg/dL (ref 8.4–10.5)
Chloride: 98 meq/L (ref 96–112)
Creatinine, Ser: 0.69 mg/dL (ref 0.40–1.20)
GFR: 87.46 mL/min (ref >60.00)
Glucose, Bld: 106 mg/dL — ABNORMAL HIGH (ref 70–99)
Potassium: 4.1 meq/L (ref 3.5–5.1)
Sodium: 136 meq/L (ref 135–145)
Total Bilirubin: 0.7 mg/dL (ref 0.2–1.2)
Total Protein: 8.4 g/dL — ABNORMAL HIGH (ref 6.0–8.3)

## 2023-09-30 LAB — HEMOGLOBIN A1C: Hgb A1c MFr Bld: 6 % (ref 4.6–6.5)

## 2023-09-30 MED ORDER — AMLODIPINE BESYLATE 2.5 MG PO TABS
2.5000 mg | ORAL_TABLET | Freq: Every day | ORAL | 1 refills | Status: DC
Start: 2023-09-30 — End: 2024-04-01

## 2023-09-30 MED ORDER — OXYBUTYNIN CHLORIDE 5 MG PO TABS: 5.0000 mg | ORAL_TABLET | Freq: Three times a day (TID) | ORAL | 1 refills | Status: DC

## 2023-09-30 MED ORDER — TELMISARTAN-HCTZ 80-25 MG PO TABS: 1.0000 | ORAL_TABLET | Freq: Every day | ORAL | 1 refills | Status: DC

## 2023-09-30 NOTE — Progress Notes (Signed)
 Subjective:  Patient ID: Angela Estrada, female    DOB: 1951-04-20  Age: 73 y.o. MRN: [id number]  CC:  Chief Complaint  Patient presents with   Annual Exam    Pt is feeling well, notes she is not fasting     HPI [place] presents for Annual Exam  PCP, me Podiatry, Dr. 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 GYN, Dr. Colvin Dec, appointment March 5.  Pap negative - plan to repeat in few years.  Cardiology, Dr. Glena Landau, recent visit.   GERD Previous treatment for heartburn/abdominal pain, discussed in October, possible prior esophagitis versus gastritis, treated with PPI, Carafate , H2 blocker, ultimately on Pepcid  1-2 times per day and stable on that med in October. Pepcid  still working well.   Urge incontinence Treated with Ditropan  twice per day, 3 times per day if traveling.  Denies dizziness, or other side effects other than some slight dry mouth previously. Same doses, no new side effects.   Prediabetes: Diet/exercise approach with slight increase in A1c from 6.2-6.3 in October.  She was cutting back on sodas, avoiding fast food, cutting back on tea.  Strength training with exercise at the [facility] 5 days/week.  Weight has improved since last year. Still trying to avoid sodas and tea, still exercising.  Lab Results  Component Value Date   HGBA1C 6.3 03/29/2023   Wt Readings from Last 3 Encounters:  09/30/23 220 lb 3.2 oz (99.9 kg)  08/14/23 221 lb (100.2 kg)  04/18/23 227 lb 6.4 oz (103.1 kg)     Hyperlipidemia: Pravastatin  40 mg daily that was increased from 20 mg by cardiology, recent visit was changed to Crestor at recent visit last week. No recent labs. Denies any side effects or myalgias.  Lab Results  Component Value Date   CHOL 165 03/29/2023   HDL 69.30 03/29/2023   LDLCALC 85 03/29/2023   TRIG 58.0 03/29/2023   CHOLHDL 2 03/29/2023   Lab Results  Component Value Date   ALT 21 03/29/2023   AST 29  03/29/2023   ALKPHOS 62 03/29/2023   BILITOT 1.0 03/29/2023    Hypertension: Treated with amlodipine  2.5 mg daily, Toprol-XL 25 mg daily, telmisartan  HCTZ 80/25 mg daily and potassium 20 mEq BID , followed by cardiology as above, denies chest pain, dyspnea on exertion or new medication side effects. Home readings: none.  BP Readings from Last 3 Encounters:  09/30/23 128/74  08/14/23 118/68  03/29/23 122/74   Lab Results  Component Value Date   CREATININE 0.68 03/29/2023       09/30/2023    9:57 AM 12/19/2022    3:45 PM 09/27/2022    3:51 PM 09/17/2022   11:05 AM 04/26/2022    9:56 AM  Depression screen PHQ 2/9  Decreased Interest 0 0 0 0 0  Down, Depressed, Hopeless 0 0 0 0 0  PHQ - 2 Score 0 0 0 0 0  Altered sleeping 0 0 0    Tired, decreased energy 0 0 0    Change in appetite 0 0 0    Feeling bad or failure about yourself  0 0 0    Trouble concentrating 0 0 0    Moving slowly or fidgety/restless 0 0 0    Suicidal thoughts 0 0 0    PHQ-9 Score 0 0 0    Difficult doing work/chores  Not difficult at all Not difficult at all      Health Maintenance  Topic  Date Due   COVID-19 Vaccine (4 - 2024-25 season) 02/10/2023   DTaP/Tdap/Td (3 - Td or Tdap) 10/03/2023   Medicare Annual Wellness (AWV)  12/19/2023   INFLUENZA VACCINE  01/10/2024   MAMMOGRAM  02/08/2024   Colonoscopy  03/21/2027   Pneumonia Vaccine 60+ Years old  Completed   DEXA SCAN  Completed   Hepatitis C Screening  Completed   Zoster Vaccines- Shingrix  Completed   HPV VACCINES  Aged Out   Meningococcal B Vaccine  Aged Out  Colonoscopy 2018 - repeat 10 years.  Recent pap testing as above.  Mammogram 02/08/23. Repeat 1 year.  Immunization History  Administered Date(s) Administered   Fluad Quad(high Dose 65+) 02/10/2019, 03/14/2020, 05/24/2021   Influenza Split 05/20/2012   Influenza, High Dose Seasonal PF 03/13/2018, 03/21/2022, 02/24/2023   Influenza,inj,Quad PF,6+ Mos 02/11/2014, 03/01/2015, 03/21/2016,  04/02/2017   PFIZER(Purple Top)SARS-COV-2 Vaccination 08/04/2019, 08/25/2019, 05/14/2020   Pneumococcal Conjugate-13 11/01/2017   Pneumococcal Polysaccharide-23 03/21/2016, 05/24/2021   Td 08/04/2001   Tdap 10/02/2013   Zoster Recombinant(Shingrix) 01/21/2019, 09/17/2019   Zoster, Live 03/29/2014    No results found. Optho appt in May.   Dental: unknown last visit - full dentures. No gum/fit issues. Follow up with dentist discussed.   Alcohol : none.   Tobacco: none  Exercise: 5 days per week usually at [facility]. 1.5 hrs. Resistance and cardio.    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   Arthritis of right hip 03/19/2016   Primary osteoarthritis of right hip 03/18/2016   Hypokalemia 03/29/2014   Arthralgia 02/11/2014   Routine general medical examination at a health care facility 03/28/2013   Nonspecific abnormal findings on radiological and examination of lung field 03/27/2013   Cough 03/11/2013   Disorder of liver 03/09/2013   Pyuria 03/09/2013   Arthritis    Ventral hernia 01/18/2012   Encounter for long-term (current) use of other medications 07/05/2011   HYPERGLYCEMIA 08/12/2008   HYPERCHOLESTEROLEMIA 04/25/2007   DIZZINESS AND GIDDINESS 04/25/2007   Essential hypertension 01/03/2007   Allergic rhinitis 01/03/2007   Past Medical History:  Diagnosis Date   Abdominal hernia    RLQ   Allergy    Arthritis    Arthritis    Borderline diabetes    GERD (gastroesophageal reflux disease)    HYPERCHOLESTEROLEMIA 04/25/2007   takes Pravastatin  daily   HYPERTENSION 01/03/2007   takes Losartan -HCTZ  and Amlodipine  daily   Hypokalemia    takes Potassium daily   Joint pain    Nocturia    Pneumonia 2 yrs ago   Primary osteoarthritis of shoulder     Right   Past Surgical History:  Procedure Laterality Date   CESAREAN SECTION  25 years ago   CHOLECYSTECTOMY     COLONOSCOPY     DILATION AND CURETTAGE OF UTERUS  35 years ago   HERNIA REPAIR     INCISIONAL HERNIA REPAIR  02/21/2012   Procedure: LAPAROSCOPIC INCISIONAL HERNIA;  Surgeon: Rogena Class, MD;  Location: [facility] OR;  Service: General;  Laterality: N/A;  laparoscopic incisional hernia repair with mesh   JOINT REPLACEMENT     bilateral knee replacements   REPLACEMENT TOTAL KNEE BILATERAL  2010   TOTAL HIP ARTHROPLASTY Right 03/19/2016   Procedure: RIGHT TOTAL HIP ARTHROPLASTY ANTERIOR APPROACH;  Surgeon: Wendolyn Hamburger, MD;  Location[facility];  Service: Orthopedics;  Laterality: Right;   TOTAL HIP REVISION Right 10/02/2017   Procedure: TOTAL HIP REVISION POSTERIOR;  Surgeon: Wendolyn Hamburger, MD;  Location: [facility] OR;  Service: Orthopedics;  Laterality: Right;   TOTAL HIP REVISION Right 05/23/2020   Procedure: RIGHT TOTAL POSTERIOR HIP REVISION;  Surgeon: Wendolyn Hamburger, MD;  Location: [facility] ORS;  Service: Orthopedics;  Laterality: Right;   TOTAL SHOULDER ARTHROPLASTY Right 12/19/2017   Procedure: RIGHT TOTAL SHOULDER ARTHROPLASTY;  Surgeon: Sammye Cristal, MD;  Location: [facility] ORS;  Service: Orthopedics;  Laterality: Right;   TUBAL LIGATION     Allergies  Allergen Reactions   Lisinopril Cough   Oxycodone  Other (See Comments)    Hallucinations   Prior to Admission medications   Medication Sig Start Date End Date Taking? Authorizing Provider  amLODipine  (NORVASC ) 2.5 MG tablet Take 1 tablet (2.5 mg total) by mouth daily. 03/29/23  Yes Benjiman Bras, MD  Apoaequorin (PREVAGEN) 10 MG CAPS Take 10 mg by mouth daily.   Yes [provider]  aspirin  EC 81 MG tablet Take 1 tablet (81 mg total) by mouth 2 (two) times daily. 05/23/20  Yes Sheron Dixons, PA-C  Carboxymethylcellul-Glycerin  (LUBRICATING EYE DROPS OP) Place 1 drop into both eyes daily as needed (dry eyes).   Yes [provider]  cetirizine-pseudoephedrine (ZYRTEC-D) 5-120 MG tablet Take 1 tablet by mouth daily as needed for allergies.   Yes [provider]  famotidine  (PEPCID ) 20 MG tablet TAKE 1 TABLET BY MOUTH TWICE A DAY 09/30/23  Yes Benjiman Bras, MD  KLOR-CON  M20 20 MEQ tablet TAKE 1 TABLET BY MOUTH TWICE A DAY 09/30/23  Yes Benjiman Bras, MD  metoprolol succinate (TOPROL-XL) 25 MG 24 hr tablet Take 25 mg by mouth daily. 08/03/20  Yes [provider]  Multiple Vitamin (MULTIVITAMIN WITH MINERALS) TABS tablet Take 1 tablet by mouth daily.   Yes [provider]  oxybutynin  (DITROPAN ) 5 MG tablet TAKE 1 TABLET BY MOUTH THREE TIMES A DAY 07/08/23  Yes Benjiman Bras, MD  rosuvastatin (CRESTOR) 20 MG tablet Take 20 mg by mouth daily.   Yes [provider]  sucralfate  (CARAFATE ) 1 g tablet Take 1 tablet (1 g total) by mouth 4 (four) times daily -  with meals and at bedtime. 09/17/22  Yes Benjiman Bras, MD  telmisartan -hydrochlorothiazide  (MICARDIS  HCT) 80-25 MG tablet Take 1 tablet by mouth daily. 03/29/23  Yes Benjiman Bras, MD   Social History   Socioeconomic History   Marital status: Married    Spouse name: Not on file   Number of children: 1   Years of education: 12th grade +   Highest education level: Not on file  Occupational History   Occupation: Beautician  Tobacco Use   Smoking status: Never   Smokeless tobacco: Never  Vaping Use   Vaping status: Never Used  Substance and Sexual Activity   Alcohol  use: No    Alcohol /week: 0.0 standard drinks of alcohol    Drug use: No   Sexual activity: Not Currently    Partners: Male    Birth control/protection: Post-menopausal    Comment: first intercourse >61 y.o, more than 5  Other Topics Concern   Not on file  Social History Narrative   Lives with her husband.   Adult daughter lives nearby.   Social Drivers of Health   Financial Resource Strain: Low Risk  (12/19/2022)   Overall Financial  Resource Strain (CARDIA)    Difficulty of Paying Living Expenses: Not hard at all  Food Insecurity: No Food Insecurity (12/19/2022)   Hunger Vital Sign    Worried About Running Out of Food in the Last Year: Never true    Ran Out of Food in the Last Year: Never true  Transportation Needs: No Transportation Needs (12/19/2022)   PRAPARE - Administrator, Civil Service (Medical): No    Lack of Transportation (Non-Medical): No  Physical Activity: Sufficiently Active (12/19/2022)   Exercise Vital Sign    Days of Exercise per Week: 4 days    Minutes of Exercise per Session: 40 min  Stress: No Stress Concern Present (12/19/2022)   [employer] of Occupational Health - Occupational Stress Questionnaire    Feeling of Stress : Not at all  Social Connections: Socially Integrated (12/19/2022)   Social Connection and Isolation Panel [NHANES]    Frequency of Communication with Friends and Family: More than three times a week    Frequency of Social Gatherings with Friends and Family: Never    Attends Religious Services: More than 4 times per year    Active Member of [employer] or Organizations: Yes    Attends Engineer, structural: More than 4 times per year    Marital Status: Married  Catering manager Violence: Not At Risk (12/19/2022)   Humiliation, Afraid, Rape, and Kick questionnaire    Fear of Current or Ex-Partner: No    Emotionally Abused: No    Physically Abused: No    Sexually Abused: No    Review of Systems 13 point review of systems per patient health survey noted.  Negative other than as indicated above or in HPI.    Objective:   Vitals:   09/30/23 0957  BP: 128/74  Pulse: 73  Temp: 97.8 F (36.6 C)  TempSrc: Temporal  SpO2: 96%  Weight: 220 lb 3.2 oz (99.9 kg)  Height: 5\' 2"  (1.575 m)     Physical Exam Vitals reviewed.  Constitutional:      Appearance: She is well-developed.  HENT:     Head: Normocephalic and atraumatic.     Right Ear: External ear  normal.     Left Ear: External ear normal.  Eyes:     Conjunctiva/sclera: Conjunctivae normal.     Pupils: Pupils are equal, round, and reactive to light.  Neck:     Thyroid : No thyromegaly.  Cardiovascular:     Rate and Rhythm: Normal rate and regular rhythm.     Heart sounds: Normal heart sounds. No murmur heard. Pulmonary:     Effort: Pulmonary effort is normal. No respiratory distress.     Breath sounds: Normal breath sounds. No wheezing.  Abdominal:     General: Bowel sounds are normal.     Palpations: Abdomen is soft.     Tenderness: There is no abdominal tenderness.  Musculoskeletal:        General: No tenderness. Normal range of motion.     Cervical back: Normal range of motion and neck supple.  Lymphadenopathy:     Cervical: No cervical adenopathy.  Skin:    General: Skin is warm and dry.     Findings: No rash.  Neurological:     Mental Status: She is alert and oriented to person, place, and time.  Psychiatric:        Behavior: Behavior normal.        Thought Content: Thought content normal.  Assessment & Plan:  TENEE WISH is a 73 y.o. female . Annual physical exam  -anticipatory guidance as below in AVS, screening labs above. Health maintenance items as above in HPI discussed/recommended as applicable.   Prediabetes - Plan: Hemoglobin A1c  - Commended on diet, exercise.  Check A1c.  No new meds for now.  Essential hypertension - Plan: telmisartan -hydrochlorothiazide  (MICARDIS  HCT) 80-25 MG tablet, amLODipine  (NORVASC ) 2.5 MG tablet  Stable with current regimen including Toprol, has refills.  Other meds refilled as above, continue follow-up with cardiology.  Pure hypercholesterolemia - Plan: Comprehensive metabolic panel with GFR, Lipid panel  Recently changed to Crestor, will check labs as above, continue follow-up with cardiology for repeat labs in the next few months.  Tolerating Crestor, no changes.  Urge incontinence - Plan: oxybutynin   (DITROPAN ) 5 MG tablet  - Stable with Ditropan , continue same.  Monitor for new side effects.  We did discuss the very low risk of ulcerogenic properties of potassium supplement with Ditropan , continue to monitor.  Meds ordered this encounter  Medications   telmisartan -hydrochlorothiazide  (MICARDIS  HCT) 80-25 MG tablet    Sig: Take 1 tablet by mouth daily.    Dispense:  90 tablet    Refill:  1   amLODipine  (NORVASC ) 2.5 MG tablet    Sig: Take 1 tablet (2.5 mg total) by mouth daily.    Dispense:  90 tablet    Refill:  1   oxybutynin  (DITROPAN ) 5 MG tablet    Sig: Take 1 tablet (5 mg total) by mouth 3 (three) times daily.    Dispense:  270 tablet    Refill:  1   Patient Instructions  Thank you for coming in today.  No medication changes at this time.  I am checking labs including cholesterol today but it may not be enough time to see the effect of the recent change to Crestor.  Keep follow-up with cardiology as planned.  I will see you in 6 months.  Keep up the good work with exercise and watch your diet.  I expect your numbers to look better today.  Take care!  Preventive Care 91 Years and Older, Female Preventive care refers to lifestyle choices and visits with your health care provider that can promote health and wellness. Preventive care visits are also called wellness exams. What can I expect for my preventive care visit? Counseling Your health care provider may ask you questions about your: Medical history, including: Past medical problems. Family medical history. Pregnancy and menstrual history. History of falls. Current health, including: Memory and ability to understand (cognition). Emotional well-being. Home life and relationship well-being. Sexual activity and sexual health. Lifestyle, including: Alcohol , nicotine or tobacco, and drug use. Access to firearms. Diet, exercise, and sleep habits. Work and work Astronomer. Sunscreen use. Safety issues such as seatbelt  and bike helmet use. Physical exam Your health care provider will check your: Height and weight. These may be used to calculate your BMI (body mass index). BMI is a measurement that tells if you are at a healthy weight. Waist circumference. This measures the distance around your waistline. This measurement also tells if you are at a healthy weight and may help predict your risk of certain diseases, such as type 2 diabetes and high blood pressure. Heart rate and blood pressure. Body temperature. Skin for abnormal spots. What immunizations do I need?  Vaccines are usually given at various ages, according to a schedule. Your health care provider will recommend vaccines for you  based on your age, medical history, and lifestyle or other factors, such as travel or where you work. What tests do I need? Screening Your health care provider may recommend screening tests for certain conditions. This may include: Lipid and cholesterol levels. Hepatitis C test. Hepatitis B test. HIV (human immunodeficiency virus) test. STI (sexually transmitted infection) testing, if you are at risk. Lung cancer screening. Colorectal cancer screening. Diabetes screening. This is done by checking your blood sugar (glucose) after you have not eaten for a while (fasting). Mammogram. Talk with your health care provider about how often you should have regular mammograms. BRCA-related cancer screening. This may be done if you have a family history of breast, ovarian, tubal, or peritoneal cancers. Bone density scan. This is done to screen for osteoporosis. Talk with your health care provider about your test results, treatment options, and if necessary, the need for more tests. Follow these instructions at home: Eating and drinking  Eat a diet that includes fresh fruits and vegetables, whole grains, lean protein, and low-fat dairy products. Limit your intake of foods with high amounts of sugar, saturated fats, and  salt. Take vitamin and mineral supplements as recommended by your health care provider. Do not drink alcohol  if your health care provider tells you not to drink. If you drink alcohol : Limit how much you have to 0-1 drink a day. Know how much alcohol  is in your drink. In the [place] one drink equals one 12 oz bottle of beer (355 mL), one 5 oz glass of wine (148 mL), or one 1 oz glass of hard liquor (44 mL). Lifestyle Brush your teeth every morning and night with fluoride toothpaste. Floss one time each day. Exercise for at least 30 minutes 5 or more days each week. Do not use any products that contain nicotine or tobacco. These products include cigarettes, chewing tobacco, and vaping devices, such as e-cigarettes. If you need help quitting, ask your health care provider. Do not use drugs. If you are sexually active, practice safe sex. Use a condom or other form of protection in order to prevent STIs. Take aspirin  only as told by your health care provider. Make sure that you understand how much to take and what form to take. Work with your health care provider to find out whether it is safe and beneficial for you to take aspirin  daily. Ask your health care provider if you need to take a cholesterol-lowering medicine (statin). Find healthy ways to manage stress, such as: Meditation, yoga, or listening to music. Journaling. Talking to a trusted person. Spending time with friends and family. Minimize exposure to UV radiation to reduce your risk of skin cancer. Safety Always wear your seat belt while driving or riding in a vehicle. Do not drive: If you have been drinking alcohol . Do not ride with someone who has been drinking. When you are tired or distracted. While texting. If you have been using any mind-altering substances or drugs. Wear a helmet and other protective equipment during sports activities. If you have firearms in your house, make sure you follow all gun safety procedures. What's  next? Visit your health care provider once a year for an annual wellness visit. Ask your health care provider how often you should have your eyes and teeth checked. Stay up to date on all vaccines. This information is not intended to replace advice given to you by your health care provider. Make sure you discuss any questions you have with your health care provider. Document Revised:  11/23/2020 Document Reviewed: 11/23/2020 Elsevier Patient Education  2024 Elsevier Inc.    Signed,   Caro Christmas, MD [facility], [facility] 09/30/23 10:47 AM

## 2023-09-30 NOTE — Patient Instructions (Signed)
 Thank you for coming in today.  No medication changes at this time.  I am checking labs including cholesterol today but it may not be enough time to see the effect of the recent change to Crestor.  Keep follow-up with cardiology as planned.  I will see you in 6 months.  Keep up the good work with exercise and watch your diet.  I expect your numbers to look better today.  Take care!  Preventive Care 49 Years and Older, Female Preventive care refers to lifestyle choices and visits with your health care provider that can promote health and wellness. Preventive care visits are also called wellness exams. What can I expect for my preventive care visit? Counseling Your health care provider may ask you questions about your: Medical history, including: Past medical problems. Family medical history. Pregnancy and menstrual history. History of falls. Current health, including: Memory and ability to understand (cognition). Emotional well-being. Home life and relationship well-being. Sexual activity and sexual health. Lifestyle, including: Alcohol , nicotine or tobacco, and drug use. Access to firearms. Diet, exercise, and sleep habits. Work and work Astronomer. Sunscreen use. Safety issues such as seatbelt and bike helmet use. Physical exam Your health care provider will check your: Height and weight. These may be used to calculate your BMI (body mass index). BMI is a measurement that tells if you are at a healthy weight. Waist circumference. This measures the distance around your waistline. This measurement also tells if you are at a healthy weight and may help predict your risk of certain diseases, such as type 2 diabetes and high blood pressure. Heart rate and blood pressure. Body temperature. Skin for abnormal spots. What immunizations do I need?  Vaccines are usually given at various ages, according to a schedule. Your health care provider will recommend vaccines for you based on your age,  medical history, and lifestyle or other factors, such as travel or where you work. What tests do I need? Screening Your health care provider may recommend screening tests for certain conditions. This may include: Lipid and cholesterol levels. Hepatitis C test. Hepatitis B test. HIV (human immunodeficiency virus) test. STI (sexually transmitted infection) testing, if you are at risk. Lung cancer screening. Colorectal cancer screening. Diabetes screening. This is done by checking your blood sugar (glucose) after you have not eaten for a while (fasting). Mammogram. Talk with your health care provider about how often you should have regular mammograms. BRCA-related cancer screening. This may be done if you have a family history of breast, ovarian, tubal, or peritoneal cancers. Bone density scan. This is done to screen for osteoporosis. Talk with your health care provider about your test results, treatment options, and if necessary, the need for more tests. Follow these instructions at home: Eating and drinking  Eat a diet that includes fresh fruits and vegetables, whole grains, lean protein, and low-fat dairy products. Limit your intake of foods with high amounts of sugar, saturated fats, and salt. Take vitamin and mineral supplements as recommended by your health care provider. Do not drink alcohol  if your health care provider tells you not to drink. If you drink alcohol : Limit how much you have to 0-1 drink a day. Know how much alcohol  is in your drink. In the [place] one drink equals one 12 oz bottle of beer (355 mL), one 5 oz glass of wine (148 mL), or one 1 oz glass of hard liquor (44 mL). Lifestyle Brush your teeth every morning and night with fluoride toothpaste. Floss one  time each day. Exercise for at least 30 minutes 5 or more days each week. Do not use any products that contain nicotine or tobacco. These products include cigarettes, chewing tobacco, and vaping devices, such as  e-cigarettes. If you need help quitting, ask your health care provider. Do not use drugs. If you are sexually active, practice safe sex. Use a condom or other form of protection in order to prevent STIs. Take aspirin  only as told by your health care provider. Make sure that you understand how much to take and what form to take. Work with your health care provider to find out whether it is safe and beneficial for you to take aspirin  daily. Ask your health care provider if you need to take a cholesterol-lowering medicine (statin). Find healthy ways to manage stress, such as: Meditation, yoga, or listening to music. Journaling. Talking to a trusted person. Spending time with friends and family. Minimize exposure to UV radiation to reduce your risk of skin cancer. Safety Always wear your seat belt while driving or riding in a vehicle. Do not drive: If you have been drinking alcohol . Do not ride with someone who has been drinking. When you are tired or distracted. While texting. If you have been using any mind-altering substances or drugs. Wear a helmet and other protective equipment during sports activities. If you have firearms in your house, make sure you follow all gun safety procedures. What's next? Visit your health care provider once a year for an annual wellness visit. Ask your health care provider how often you should have your eyes and teeth checked. Stay up to date on all vaccines. This information is not intended to replace advice given to you by your health care provider. Make sure you discuss any questions you have with your health care provider. Document Revised: 11/23/2020 Document Reviewed: 11/23/2020 Elsevier Patient Education  2024 [employer].

## 2023-10-01 ENCOUNTER — Encounter: Payer: Self-pay | Admitting: Family Medicine

## 2023-10-15 DIAGNOSIS — H2513 Age-related nuclear cataract, bilateral: Secondary | ICD-10-CM | POA: Diagnosis not present

## 2023-11-06 DIAGNOSIS — M25511 Pain in right shoulder: Secondary | ICD-10-CM | POA: Diagnosis not present

## 2023-12-23 DIAGNOSIS — E782 Mixed hyperlipidemia: Secondary | ICD-10-CM | POA: Diagnosis not present

## 2023-12-23 DIAGNOSIS — M150 Primary generalized (osteo)arthritis: Secondary | ICD-10-CM | POA: Diagnosis not present

## 2023-12-23 DIAGNOSIS — R7303 Prediabetes: Secondary | ICD-10-CM | POA: Diagnosis not present

## 2023-12-23 DIAGNOSIS — I10 Essential (primary) hypertension: Secondary | ICD-10-CM | POA: Diagnosis not present

## 2024-01-13 ENCOUNTER — Other Ambulatory Visit: Payer: Self-pay | Admitting: Obstetrics and Gynecology

## 2024-01-13 DIAGNOSIS — Z1231 Encounter for screening mammogram for malignant neoplasm of breast: Secondary | ICD-10-CM

## 2024-02-02 ENCOUNTER — Other Ambulatory Visit: Payer: Self-pay | Admitting: Family Medicine

## 2024-02-02 DIAGNOSIS — E7800 Pure hypercholesterolemia, unspecified: Secondary | ICD-10-CM

## 2024-02-11 ENCOUNTER — Ambulatory Visit
Admission: RE | Admit: 2024-02-11 | Discharge: 2024-02-11 | Disposition: A | Discharge status: Home / Self Care | Source: Ambulatory Visit | Attending: Obstetrics and Gynecology | Admitting: Obstetrics and Gynecology

## 2024-02-11 DIAGNOSIS — Z1231 Encounter for screening mammogram for malignant neoplasm of breast: Secondary | ICD-10-CM

## 2024-02-18 ENCOUNTER — Ambulatory Visit: Payer: Self-pay | Admitting: Obstetrics and Gynecology

## 2024-03-04 ENCOUNTER — Encounter (HOSPITAL_COMMUNITY): Payer: Self-pay

## 2024-03-04 ENCOUNTER — Other Ambulatory Visit: Payer: Self-pay

## 2024-03-04 ENCOUNTER — Emergency Department (HOSPITAL_COMMUNITY)

## 2024-03-04 ENCOUNTER — Emergency Department (HOSPITAL_COMMUNITY)
Admission: EM | Admit: 2024-03-04 | Discharge: 2024-03-04 | Disposition: A | Discharge status: Home / Self Care | Attending: Emergency Medicine | Admitting: Emergency Medicine

## 2024-03-04 DIAGNOSIS — Z96641 Presence of right artificial hip joint: Secondary | ICD-10-CM | POA: Diagnosis not present

## 2024-03-04 DIAGNOSIS — Y792 Prosthetic and other implants, materials and accessory orthopedic devices associated with adverse incidents: Secondary | ICD-10-CM | POA: Insufficient documentation

## 2024-03-04 DIAGNOSIS — S79911A Unspecified injury of right hip, initial encounter: Secondary | ICD-10-CM | POA: Diagnosis not present

## 2024-03-04 DIAGNOSIS — Z79899 Other long term (current) drug therapy: Secondary | ICD-10-CM | POA: Diagnosis not present

## 2024-03-04 DIAGNOSIS — Y93E2 Activity, laundry: Secondary | ICD-10-CM | POA: Insufficient documentation

## 2024-03-04 DIAGNOSIS — T84020A Dislocation of internal right hip prosthesis, initial encounter: Secondary | ICD-10-CM | POA: Diagnosis not present

## 2024-03-04 DIAGNOSIS — S73004A Unspecified dislocation of right hip, initial encounter: Secondary | ICD-10-CM

## 2024-03-04 DIAGNOSIS — Z7982 Long term (current) use of aspirin: Secondary | ICD-10-CM | POA: Insufficient documentation

## 2024-03-04 MED ORDER — PROPOFOL 10 MG/ML IV BOLUS
0.5000 mg/kg | Freq: Once | INTRAVENOUS | Status: AC
  Administered 2024-03-04: 49.9 mg via INTRAVENOUS
  Filled 2024-03-04: qty 20

## 2024-03-04 MED ORDER — MORPHINE SULFATE (PF) 4 MG/ML IV SOLN
4.0000 mg | Freq: Once | INTRAVENOUS | Status: AC
  Administered 2024-03-04: 4 mg via INTRAVENOUS
  Filled 2024-03-04: qty 1

## 2024-03-04 MED ORDER — ONDANSETRON HCL 4 MG/2ML IJ SOLN
4.0000 mg | Freq: Once | INTRAMUSCULAR | Status: AC
  Administered 2024-03-04: 4 mg via INTRAVENOUS
  Filled 2024-03-04: qty 2

## 2024-03-04 MED ORDER — TRAMADOL HCL 50 MG PO TABS: 50.0000 mg | ORAL_TABLET | Freq: Four times a day (QID) | ORAL | 0 refills | Status: AC | PRN

## 2024-03-04 NOTE — ED Triage Notes (Signed)
 Pt arrives via POV. Pt reports when she was standing and doing laundry, her right hip began hurting. She thinks it could be out of place. Pt denies any other symptoms. She is AxOx4.

## 2024-03-04 NOTE — Progress Notes (Signed)
 RT called regarding conscious sedation. Code cart outside of room, AMBU bag ready if needed, and suction set up.  Pt did well throughout. Vitals signs HR 71 RR 18 O2 100% ETCO2 39.

## 2024-03-04 NOTE — Discharge Instructions (Signed)
 As we discussed, you can bear weight on the right leg but use your assistive devices (cane, walker) at home to prevent fall. Keep the knee immobilizer on, even while in bed, to prevent recurrent dislocation.   Dr. Minette office should reach out to you regarding a follow up appointment this week. If you do not hear from them tomorrow, try calling the office to schedule a time to be seen.   Return to the ED as needed.

## 2024-03-04 NOTE — ED Provider Notes (Signed)
 [place] [facility] AT [facility] Provider Note   CSN: [id number] Arrival date & time: 03/04/24  1738     Patient presents with: Hip Injury   Angela Estrada is a 73 y.o. female.   Patient to ED with right hip pain that started suddenly while standing and bending getting clothes out of the dryer just prior to arrival. No fall. History of ORIF of the hip x 2, the last one 3-4 years ago by Dr. Liam. Not anticoagulated.   The history is provided by the patient and the spouse. No language interpreter was used.       Prior to Admission medications   Medication Sig Start Date End Date Taking? Authorizing Provider  amLODipine  (NORVASC ) 2.5 MG tablet Take 1 tablet (2.5 mg total) by mouth daily. 09/30/23   Levora Reyes SAUNDERS, MD  Apoaequorin (PREVAGEN) 10 MG CAPS Take 10 mg by mouth daily.    [provider]  aspirin  EC 81 MG tablet Take 1 tablet (81 mg total) by mouth 2 (two) times daily. 05/23/20   Orlando Camellia POUR, PA-C  Carboxymethylcellul-Glycerin  (LUBRICATING EYE DROPS OP) Place 1 drop into both eyes daily as needed (dry eyes).    [provider]  cetirizine-pseudoephedrine (ZYRTEC-D) 5-120 MG tablet Take 1 tablet by mouth daily as needed for allergies.    [provider]  famotidine  (PEPCID ) 20 MG tablet TAKE 1 TABLET BY MOUTH TWICE A DAY 09/30/23   Levora Reyes SAUNDERS, MD  KLOR-CON  M20 20 MEQ tablet TAKE 1 TABLET BY MOUTH TWICE A DAY 09/30/23   Levora Reyes SAUNDERS, MD  metoprolol succinate (TOPROL-XL) 25 MG 24 hr tablet Take 25 mg by mouth daily. 08/03/20   [provider]  Multiple Vitamin (MULTIVITAMIN WITH MINERALS) TABS tablet Take 1 tablet by mouth daily.    [provider]  oxybutynin  (DITROPAN ) 5 MG tablet Take 1 tablet (5 mg total) by mouth 3 (three) times daily. 09/30/23   Levora Reyes SAUNDERS, MD  rosuvastatin (CRESTOR) 20 MG tablet Take 20 mg by mouth daily.    [provider]   telmisartan -hydrochlorothiazide  (MICARDIS  HCT) 80-25 MG tablet Take 1 tablet by mouth daily. 09/30/23   Levora Reyes SAUNDERS, MD    Allergies: Lisinopril and Oxycodone     Review of Systems  Updated Vital Signs BP (!) 135/58 (BP Location: Left Arm)   Pulse 70   Temp 98.1 F (36.7 C) (Oral)   Resp 16   Wt 99.8 kg   LMP 08/30/1998 (Approximate)   SpO2 98%   BMI 40.24 kg/m   Physical Exam Vitals and nursing note reviewed.  Musculoskeletal:     Comments: Right leg is shortened and internally rotated. Pulses present.   Neurological:     Mental Status: She is alert.     Sensory: No sensory deficit.     (all labs ordered are listed, but only abnormal results are displayed) Labs Reviewed - No data to display   EKG: None  Radiology: DG Hip Unilat W or Wo Pelvis 1 View Right Result Date: 03/04/2024 CLINICAL DATA:  Postreduction EXAM: DG HIP (WITH OR WITHOUT PELVIS) 1V RIGHT COMPARISON:  03/04/2024 FINDINGS: Interval reduction of the previously seen dislocated right hip replacement. Normal AP alignment. No visible complicating feature scar stat no acute bony abnormality. IMPRESSION: Interval reduction of the previously seen dislocated right hip replacement with normal AP alignment. Electronically Signed   By: Franky Crease M.D.   On: 03/04/2024 20:15   DG Hip  Unilat  With Pelvis 2-3 Views Right Result Date: 03/04/2024 CLINICAL DATA:  Pain EXAM: DG HIP (WITH OR WITHOUT PELVIS) 2-3V RIGHT COMPARISON:  Right hip x-ray 10/03/2017 FINDINGS: Right hip arthroplasty is present. There is a superior dislocation of the femoral head component. No fracture identified. There are mild degenerative changes of the left hip. 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 Electronically Signed   By: Greig Pique M.D.   On: 03/04/2024 18:59     Procedures   Medications Ordered in the ED  propofol  (DIPRIVAN ) 10 mg/mL bolus/IV push 49.9 mg (49.9 mg Intravenous Given 03/04/24 1948)   morphine  (PF) 4 MG/ML injection 4 mg (4 mg Intravenous Given 03/04/24 1919)  ondansetron  (ZOFRAN ) injection 4 mg (4 mg Intravenous Given 03/04/24 1920)    Clinical Course as of 03/04/24 2141  Wed Mar 04, 2024  1905 Imaging consistent with dislocated right hip joint without visualized fracture. Hardware appears intact. Extremity is neurovascularly intact.  [SU]  2011 Patient sedated under Dr. Serene orders/supervision. Hip dislocation successfully reduced by Dr. Dean. Knee immobilizer placed Will consult ortho to arrange for follow up.  [SU]  2140 Discussed with Jesse [place], PA-C, with [facility] who advises he will arrange office follow and contact patient with appointment date and time.  [SU]    Clinical Course User Index [SU] Odell Balls, PA-C                                 Medical Decision Making Amount and/or Complexity of Data Reviewed Labs: ordered. Radiology: ordered.  Risk Prescription drug management.        Final diagnoses:  Dislocation of right hip, initial encounter [facility])    ED Discharge Orders     None          Odell Balls RIGGERS 03/04/24 2141    Dean Clarity, MD 03/04/24 2145

## 2024-03-04 NOTE — ED Provider Notes (Signed)
 Physical Exam  BP (!) 135/58 (BP Location: Left Arm)   Pulse 70   Temp 98.1 F (36.7 C) (Oral)   Resp 16   Wt 99.8 kg   LMP 08/30/1998 (Approximate)   SpO2 98%   BMI 40.24 kg/m   Physical Exam  Procedures  .Sedation  Date/Time: 03/04/2024 8:09 PM  Performed by: Dean Clarity, MD Authorized by: Dean Clarity, MD   Consent:    Consent obtained:  Written   Consent given by:  Patient   Risks discussed:  Allergic reaction, dysrhythmia, inadequate sedation, nausea, prolonged hypoxia resulting in organ damage, prolonged sedation necessitating reversal, respiratory compromise necessitating ventilatory assistance and intubation and vomiting   Alternatives discussed:  Analgesia without sedation, anxiolysis and regional anesthesia Universal protocol:    Procedure explained and questions answered to patient or proxy's satisfaction: yes     Relevant documents present and verified: yes     Test results available: yes     Imaging studies available: yes     Required blood products, implants, devices, and special equipment available: yes     Site/side marked: yes     Immediately prior to procedure, a time out was called: yes     Patient identity confirmed:  Verbally with patient Indications:    Procedure performed:  Dislocation reduction   Procedure necessitating sedation performed by:  Physician performing sedation Pre-sedation assessment:    Time since last food or drink:  5   ASA classification: class 2 - patient with mild systemic disease     Mouth opening:  3 or more finger widths   Thyromental distance:  4 finger widths   Mallampati score:  I - soft palate, uvula, fauces, pillars visible   Neck mobility: normal     Pre-sedation assessments completed and reviewed: airway patency, cardiovascular function, hydration status, mental status, nausea/vomiting, pain level, respiratory function and temperature   A pre-sedation assessment was completed prior to the start of the  procedure Immediate pre-procedure details:    Reassessment: Patient reassessed immediately prior to procedure     Reviewed: vital signs, relevant labs/tests and NPO status     Verified: bag valve mask available, emergency equipment available, intubation equipment available, IV patency confirmed, oxygen available and suction available   Procedure details (see MAR for exact dosages):    Preoxygenation:  Nasal cannula   Sedation:  Propofol    Intended level of sedation: deep   Intra-procedure monitoring:  Blood pressure monitoring, cardiac monitor, continuous pulse oximetry, frequent LOC assessments, frequent vital sign checks and continuous capnometry   Intra-procedure events: none     Total Provider sedation time (minutes):  30 Post-procedure details:   A post-sedation assessment was completed following the completion of the procedure.   Attendance: Constant attendance by certified staff until patient recovered     Recovery: Patient returned to pre-procedure baseline     Post-sedation assessments completed and reviewed: airway patency, cardiovascular function, hydration status, mental status, nausea/vomiting, pain level, respiratory function and temperature     Patient is stable for discharge or admission: yes     Procedure completion:  Tolerated well, no immediate complications .Reduction of dislocation  Date/Time: 03/04/2024 8:10 PM  Performed by: Dean Clarity, MD Authorized by: Dean Clarity, MD  Consent: Written consent obtained Consent given by: patient Patient understanding: patient states understanding of the procedure being performed Patient consent: the patient's understanding of the procedure matches consent given Procedure consent: procedure consent matches procedure scheduled Relevant documents: relevant documents present and verified  Test results: test results available and properly labeled Imaging studies: imaging studies available Patient identity confirmed: verbally  with patient Time out: Immediately prior to procedure a time out was called to verify the correct patient, procedure, equipment, support staff and site/side marked as required.  Sedation: Patient sedated: yes Sedatives: propofol  Analgesia: morphine  Vitals: Vital signs were monitored during sedation.  Patient tolerance: patient tolerated the procedure well with no immediate complications Comments: Right hip reduced     ED Course / MDM   Clinical Course as of 03/04/24 2009  Wed Mar 04, 2024  1905 Imaging consistent with dislocated right hip joint without visualized fracture. Hardware appears intact. Extremity is neurovascularly intact.  [SU]    Clinical Course User Index [SU] Odell Balls, PA-C   Medical Decision Making Amount and/or Complexity of Data Reviewed Labs: ordered. Radiology: ordered.  Risk Prescription drug management.          Dean Clarity, MD 03/04/24 2012

## 2024-03-04 NOTE — Progress Notes (Signed)
 Orthopedic Tech Progress Note Patient Details:  Angela Estrada 1950-07-03 [id number]  Ortho Devices Type of Ortho Device: Knee Immobilizer Ortho Device/Splint Location: right Ortho Device/Splint Interventions: Ordered, Application, Adjustment   Post Interventions Patient Tolerated: Well Instructions Provided: Adjustment of device, Care of device  Waylan Thom Loving 03/04/2024, 8:01 PM

## 2024-03-05 DIAGNOSIS — S73034A Other anterior dislocation of right hip, initial encounter: Secondary | ICD-10-CM | POA: Diagnosis not present

## 2024-03-12 DIAGNOSIS — M25551 Pain in right hip: Secondary | ICD-10-CM | POA: Diagnosis not present

## 2024-03-13 DIAGNOSIS — T84028A Dislocation of other internal joint prosthesis, initial encounter: Secondary | ICD-10-CM | POA: Diagnosis not present

## 2024-03-18 ENCOUNTER — Ambulatory Visit

## 2024-03-18 VITALS — Ht 61.5 in | Wt 220.0 lb

## 2024-03-18 DIAGNOSIS — Z0000 Encounter for general adult medical examination without abnormal findings: Secondary | ICD-10-CM | POA: Diagnosis not present

## 2024-03-18 NOTE — Progress Notes (Signed)
 Subjective:   Angela Estrada is a 73 y.o. who presents for a Medicare Wellness preventive visit.  As a reminder, Annual Wellness Visits don't include a physical exam, and some assessments may be limited, especially if this visit is performed virtually. We may recommend an in-person follow-up visit with your provider if needed.  Visit Complete: Virtual I connected with  Angela Estrada on 03/18/24 by a audio enabled telemedicine application and verified that I am speaking with the correct person using two identifiers.  Patient Location: Home  Provider Location: Office/Clinic  I discussed the limitations of evaluation and management by telemedicine. The patient expressed understanding and agreed to proceed.  Vital Signs: Because this visit was a virtual/telehealth visit, some criteria may be missing or patient reported. Any vitals not documented were not able to be obtained and vitals that have been documented are patient reported.  VideoDeclined- This patient declined Librarian, academic. Therefore the visit was completed with audio only.  Persons Participating in Visit: Patient.  AWV Questionnaire: No: Patient Medicare AWV questionnaire was not completed prior to this visit.  Cardiac Risk Factors include: advanced age (>51men, >70 women);dyslipidemia;hypertension;obesity (BMI >30kg/m2)     Objective:    Today's Vitals   03/18/24 1340  Weight: 220 lb (99.8 kg)  Height: 5' 1.5 (1.562 m)   Body mass index is 40.9 kg/m.     03/18/2024    1:39 PM 03/04/2024    5:45 PM 12/19/2022    3:40 PM 03/27/2021   12:47 PM 07/27/2020    7:23 PM 06/15/2020   11:29 AM 05/23/2020    3:46 PM  Advanced Directives  Does Patient Have a Medical Advance Directive? No No No No No Yes Yes  Type of Engineer, drilling of [place];Living will  Does patient want to make changes to medical advance directive?      No -  Patient declined No - Patient declined  Copy of Healthcare Power of Attorney in Chart?      No - copy requested No - copy requested  Would patient like information on creating a medical advance directive? No - Patient declined  No - Patient declined No - Patient declined No - Patient declined      Current Medications (verified) Outpatient Encounter Medications as of 03/18/2024  Medication Sig   amLODipine  (NORVASC ) 2.5 MG tablet Take 1 tablet (2.5 mg total) by mouth daily.   Apoaequorin (PREVAGEN) 10 MG CAPS Take 10 mg by mouth daily.   aspirin  EC 81 MG tablet Take 1 tablet (81 mg total) by mouth 2 (two) times daily.   Carboxymethylcellul-Glycerin  (LUBRICATING EYE DROPS OP) Place 1 drop into both eyes daily as needed (dry eyes).   cetirizine-pseudoephedrine (ZYRTEC-D) 5-120 MG tablet Take 1 tablet by mouth daily as needed for allergies.   famotidine  (PEPCID ) 20 MG tablet TAKE 1 TABLET BY MOUTH TWICE A DAY   KLOR-CON  M20 20 MEQ tablet TAKE 1 TABLET BY MOUTH TWICE A DAY   metoprolol succinate (TOPROL-XL) 25 MG 24 hr tablet Take 25 mg by mouth daily.   Multiple Vitamin (MULTIVITAMIN WITH MINERALS) TABS tablet Take 1 tablet by mouth daily.   oxybutynin  (DITROPAN ) 5 MG tablet Take 1 tablet (5 mg total) by mouth 3 (three) times daily.   rosuvastatin (CRESTOR) 20 MG tablet Take 20 mg by mouth daily.   telmisartan -hydrochlorothiazide  (MICARDIS  HCT) 80-25 MG tablet Take 1 tablet by mouth daily.  traMADol  (ULTRAM ) 50 MG tablet Take 1 tablet (50 mg total) by mouth every 6 (six) hours as needed.   No facility-administered encounter medications on file as of 03/18/2024.    Allergies (verified) Lisinopril and Oxycodone    History: Past Medical History:  Diagnosis Date   Abdominal hernia    RLQ   Allergy    Arthritis    Arthritis    Borderline diabetes    GERD (gastroesophageal reflux disease)    HYPERCHOLESTEROLEMIA 04/25/2007   takes Pravastatin  daily   HYPERTENSION 01/03/2007   takes  Losartan -HCTZ  and Amlodipine  daily   Hypokalemia    takes Potassium daily   Joint pain    Nocturia    Pneumonia 2 yrs ago   Primary osteoarthritis of shoulder    Right   Past Surgical History:  Procedure Laterality Date   CESAREAN SECTION  25 years ago   CHOLECYSTECTOMY     COLONOSCOPY     DILATION AND CURETTAGE OF UTERUS  35 years ago   HERNIA REPAIR     INCISIONAL HERNIA REPAIR  02/21/2012   Procedure: LAPAROSCOPIC INCISIONAL HERNIA;  Surgeon: Vicenta DELENA Poli, MD;  Location: [facility] OR;  Service: General;  Laterality: N/A;  laparoscopic incisional hernia repair with mesh   JOINT REPLACEMENT     bilateral knee replacements   REPLACEMENT TOTAL KNEE BILATERAL  2010   TOTAL HIP ARTHROPLASTY Right 03/19/2016   Procedure: RIGHT TOTAL HIP ARTHROPLASTY ANTERIOR APPROACH;  Surgeon: Dempsey Sensor, MD;  Location: [facility] OR;  Service: Orthopedics;  Laterality: Right;   TOTAL HIP REVISION Right 10/02/2017   Procedure: TOTAL HIP REVISION POSTERIOR;  Surgeon: Sensor Dempsey, MD;  Location: [facility] OR;  Service: Orthopedics;  Laterality: Right;   TOTAL HIP REVISION Right 05/23/2020   Procedure: RIGHT TOTAL POSTERIOR HIP REVISION;  Surgeon: Sensor Dempsey, MD;  Location: [facility];  Service: Orthopedics;  Laterality: Right;   TOTAL SHOULDER ARTHROPLASTY Right 12/19/2017   Procedure: RIGHT TOTAL SHOULDER ARTHROPLASTY;  Surgeon: Dozier Soulier, MD;  Location: [facility];  Service: Orthopedics;  Laterality: Right;   TUBAL LIGATION     Family History  Problem Relation Age of Onset   Diabetes Mother    Heart disease Mother    Hypertension Mother    Stroke Mother    Diabetes Father    Heart disease Father        Triple Bypass   Hypertension Father    Hypertension Sister    Hypertension Sister    Uterine cancer Sister    Hypertension Sister    Hypertension Sister    Hypertension Sister    Uterine cancer Sister    Hypertension Sister    Heart attack Brother    Congestive Heart Failure Brother    Hypertension  Brother    Heart disease Brother    Colon cancer Neg Hx    Breast cancer Neg Hx    BRCA 1/2 Neg Hx    Social History   Socioeconomic History   Marital status: Married    Spouse name: Not on file   Number of children: 1   Years of education: 12th grade +   Highest education level: Not on file  Occupational History   Occupation: Beautician  Tobacco Use   Smoking status: Never   Smokeless tobacco: Never  Vaping Use   Vaping status: Never Used  Substance and Sexual Activity   Alcohol  use: No    Alcohol /week: 0.0 standard drinks of alcohol    Drug use: No  Sexual activity: Not Currently    Partners: Male    Birth control/protection: Post-menopausal    Comment: first intercourse >28 y.o, more than 5  Other Topics Concern   Not on file  Social History Narrative   Lives with her husband.   Adult daughter lives nearby.   Social Drivers of Corporate investment banker Strain: Low Risk  (03/18/2024)   Overall Financial Resource Strain (CARDIA)    Difficulty of Paying Living Expenses: Not hard at all  Food Insecurity: No Food Insecurity (03/18/2024)   Hunger Vital Sign    Worried About Running Out of Food in the Last Year: Never true    Ran Out of Food in the Last Year: Never true  Transportation Needs: No Transportation Needs (03/18/2024)   PRAPARE - Administrator, Civil Service (Medical): No    Lack of Transportation (Non-Medical): No  Physical Activity: Sufficiently Active (03/18/2024)   Exercise Vital Sign    Days of Exercise per Week: 3 days    Minutes of Exercise per Session: 60 min  Stress: No Stress Concern Present (03/18/2024)   [employer] of Occupational Health - Occupational Stress Questionnaire    Feeling of Stress: Not at all  Social Connections: Socially Integrated (03/18/2024)   Social Connection and Isolation Panel    Frequency of Communication with Friends and Family: More than three times a week    Frequency of Social Gatherings with Friends  and Family: Never    Attends Religious Services: More than 4 times per year    Active Member of [employer] or Organizations: Yes    Attends Engineer, structural: More than 4 times per year    Marital Status: Married    Tobacco Counseling Counseling given: Not Answered    Clinical Intake:  Pre-visit preparation completed: Yes  Pain : No/denies pain     BMI - recorded: 40.9 Nutritional Status: BMI > 30  Obese Nutritional Risks: None Diabetes: No  Lab Results  Component Value Date   HGBA1C 6.0 09/30/2023   HGBA1C 6.3 03/29/2023   HGBA1C 6.2 09/17/2022     How often do you need to have someone help you when you read instructions, pamphlets, or other written materials from your doctor or pharmacy?: 1 - Never  Interpreter Needed?: No  Information entered by :: Verdie Saba, CMA   Activities of Daily Living     03/18/2024    1:42 PM  In your present state of health, do you have any difficulty performing the following activities:  Hearing? 0  Vision? 0  Difficulty concentrating or making decisions? 0  Walking or climbing stairs? 0  Dressing or bathing? 0  Doing errands, shopping? 0  Preparing Food and eating ? N  Using the Toilet? N  In the past six months, have you accidently leaked urine? N  Do you have problems with loss of bowel control? N  Managing your Medications? N  Managing your Finances? N  Housekeeping or managing your Housekeeping? N    Patient Care Team: Levora Reyes SAUNDERS, MD as PCP - General (Family Medicine) Cathlyn JAYSON Nikki Bobie FORBES, MD as Attending Physician (Obstetrics and Gynecology) Dozier Soulier, MD as Consulting Physician (Orthopedic Surgery) Liam Lerner, MD as Consulting Physician (Orthopedic Surgery) Octavia Bruckner, MD as Consulting Physician (Ophthalmology)  I have updated your Care Teams any recent Medical Services you may have received from other providers in the past year.     Assessment:   This is a  routine  wellness examination for [place].  Hearing/Vision screen Hearing Screening - Comments:: Denies hearing difficulties   Vision Screening - Comments:: Wears rx glasses - up to date with routine eye exams with [facility]   Goals Addressed               This Visit's Progress     Patient Stated (pt-stated)        Patient stated she plans to continue walking and watching her diet       Depression Screen     03/18/2024    1:44 PM 09/30/2023    9:57 AM 12/19/2022    3:45 PM 09/27/2022    3:51 PM 09/17/2022   11:05 AM 04/26/2022    9:56 AM 03/29/2022    1:09 PM  PHQ 2/9 Scores  PHQ - 2 Score 0 0 0 0 0 0 0  PHQ- 9 Score 0 0 0 0   0    Fall Risk     03/18/2024    1:43 PM 09/30/2023    9:57 AM 08/14/2023   12:17 PM 12/19/2022    3:40 PM 09/27/2022    3:51 PM  Fall Risk   Falls in the past year? 0 0 0 0 0  Number falls in past yr: 0 0 0 0   Injury with Fall? 0 0 0 0   Risk for fall due to : No Fall Risks No Fall Risks No Fall Risks  No Fall Risks  Follow up Falls evaluation completed;Falls prevention discussed Falls evaluation completed Falls evaluation completed Falls evaluation completed;Education provided;Falls prevention discussed     MEDICARE RISK AT HOME:  Medicare Risk at Home Any stairs in or around the home?: Yes If so, are there any without handrails?: No Home free of loose throw rugs in walkways, pet beds, electrical cords, etc?: Yes Adequate lighting in your home to reduce risk of falls?: Yes Life alert?: No Use of a cane, walker or w/c?: Yes (cane) Grab bars in the bathroom?: Yes Shower chair or bench in shower?: Yes Elevated toilet seat or a handicapped toilet?: Yes  TIMED UP AND GO:  Was the test performed?  No  Cognitive Function: 6CIT completed        03/18/2024    1:46 PM 12/19/2022    3:43 PM 03/29/2022    1:10 PM 02/10/2020   11:04 AM 02/03/2019    2:03 PM  6CIT Screen  What Year? 0 points 0 points 0 points 0 points 0 points  What month? 0  points 0 points 0 points 0 points 0 points  What time? 0 points 0 points 0 points 0 points 0 points  Count back from 20 0 points 0 points 0 points 0 points 0 points  Months in reverse 0 points 0 points 0 points 0 points 0 points  Repeat phrase 0 points 0 points 0 points 0 points 0 points  Total Score 0 points 0 points 0 points 0 points 0 points    Immunizations Immunization History  Administered Date(s) Administered   Fluad Quad(high Dose 65+) 02/10/2019, 03/14/2020, 05/24/2021   INFLUENZA, HIGH DOSE SEASONAL PF 03/13/2018, 03/21/2022, 02/24/2023   Influenza Split 05/20/2012   Influenza,inj,Quad PF,6+ Mos 02/11/2014, 03/01/2015, 03/21/2016, 04/02/2017   PFIZER(Purple Top)SARS-COV-2 Vaccination 08/04/2019, 08/25/2019, 05/14/2020   Pneumococcal Conjugate-13 11/01/2017   Pneumococcal Polysaccharide-23 03/21/2016, 05/24/2021   Td 08/04/2001   Tdap 10/02/2013   Zoster Recombinant(Shingrix) 01/21/2019, 09/17/2019   Zoster, Live 03/29/2014    Screening Tests  Health Maintenance  Topic Date Due   DTaP/Tdap/Td (3 - Td or Tdap) 10/03/2023   Influenza Vaccine  01/10/2024   COVID-19 Vaccine (4 - 2025-26 season) 02/10/2024   Mammogram  02/10/2025   Medicare Annual Wellness (AWV)  03/18/2025   Colonoscopy  03/21/2027   Pneumococcal Vaccine: 50+ Years  Completed   DEXA SCAN  Completed   Hepatitis C Screening  Completed   Zoster Vaccines- Shingrix  Completed   Meningococcal B Vaccine  Aged Out    Health Maintenance Items Addressed: 03/18/2024  Additional Screening:  Vision Screening: Recommended annual ophthalmology exams for early detection of glaucoma and other disorders of the eye. Is the patient up to date with their annual eye exam?  Yes  Who is the provider or what is the name of the office in which the patient attends annual eye exams? [facility]  Dental Screening: Recommended annual dental exams for proper oral hygiene  Community Resource Referral / Chronic Care  Management: CRR required this visit?  No   CCM required this visit?  No   Plan:    I have personally reviewed and noted the following in the patient's chart:   Medical and social history Use of alcohol , tobacco or illicit drugs  Current medications and supplements including opioid prescriptions. Patient is currently taking opioid prescriptions. Information provided to patient regarding non-opioid alternatives. Patient advised to discuss non-opioid treatment plan with their provider. Functional ability and status Nutritional status Physical activity Advanced directives List of other physicians Hospitalizations, surgeries, and ER visits in previous 12 months Vitals Screenings to include cognitive, depression, and falls Referrals and appointments  In addition, I have reviewed and discussed with patient certain preventive protocols, quality metrics, and best practice recommendations. A written personalized care plan for preventive services as well as general preventive health recommendations were provided to patient.   Verdie CHRISTELLA Saba, CMA   03/18/2024   After Visit Summary: (MyChart) Due to this being a telephonic visit, the after visit summary with patients personalized plan was offered to patient via MyChart   Notes: Nothing significant to report at this time.

## 2024-03-18 NOTE — Patient Instructions (Signed)
 [name],  Thank you for taking the time for your Medicare Wellness Visit. I appreciate your continued commitment to your health goals. Please review the care plan we discussed, and feel free to reach out if I can assist you further.  Medicare recommends these wellness visits once per year to help you and your care team stay ahead of potential health issues. These visits are designed to focus on prevention, allowing your provider to concentrate on managing your acute and chronic conditions during your regular appointments.  Please note that Annual Wellness Visits do not include a physical exam. Some assessments may be limited, especially if the visit was conducted virtually. If needed, we may recommend a separate in-person follow-up with your provider.  Ongoing Care Seeing your primary care provider every 3 to 6 months helps us  monitor your health and provide consistent, personalized care.   Referrals If a referral was made during today's visit and you haven't received any updates within two weeks, please contact the referred provider directly to check on the status.  Recommended Screenings:  Health Maintenance  Topic Date Due   DTaP/Tdap/Td vaccine (3 - Td or Tdap) 10/03/2023   Flu Shot  01/10/2024   COVID-19 Vaccine (4 - 2025-26 season) 02/10/2024   Breast Cancer Screening  02/10/2025   Medicare Annual Wellness Visit  03/18/2025   Colon Cancer Screening  03/21/2027   Pneumococcal Vaccine for age over 59  Completed   DEXA scan (bone density measurement)  Completed   Hepatitis C Screening  Completed   Zoster (Shingles) Vaccine  Completed   Meningitis B Vaccine  Aged Out       03/18/2024    1:39 PM  Advanced Directives  Does Patient Have a Medical Advance Directive? No  Would patient like information on creating a medical advance directive? No - Patient declined   Advance Care Planning is important because it: Ensures you receive medical care that aligns with your values,  goals, and preferences. Provides guidance to your family and loved ones, reducing the emotional burden of decision-making during critical moments.  Vision: Annual vision screenings are recommended for early detection of glaucoma, cataracts, and diabetic retinopathy. These exams can also reveal signs of chronic conditions such as diabetes and high blood pressure.  Dental: Annual dental screenings help detect early signs of oral cancer, gum disease, and other conditions linked to overall health, including heart disease and diabetes.  Managing Pain Without Opioids Opioids are strong medicines used to treat moderate to severe pain. For some people, especially those who have long-term (chronic) pain, opioids may not be the best choice for pain management due to: Side effects like nausea, constipation, and sleepiness. The risk of addiction (opioid use disorder). The longer you take opioids, the greater your risk of addiction. Pain that lasts for more than 3 months is called chronic pain. Managing chronic pain usually requires more than one approach and is often provided by a team of health care providers working together (multidisciplinary approach). Pain management may be done at a pain management center or pain clinic. How to manage pain without the use of opioids Use non-opioid medicines Non-opioid medicines for pain may include: Over-the-counter or prescription non-steroidal anti-inflammatory drugs (NSAIDs). These may be the first medicines used for pain. They work well for muscle and bone pain, and they reduce swelling. Acetaminophen . This over-the-counter medicine may work well for milder pain but not swelling. Antidepressants. These may be used to treat chronic pain. A certain type of antidepressant (tricyclics)  is often used. These medicines are given in lower doses for pain than when used for depression. Anticonvulsants. These are usually used to treat seizures but may also reduce nerve  (neuropathic) pain. Muscle relaxants. These relieve pain caused by sudden muscle tightening (spasms). You may also use a pain medicine that is applied to the skin as a patch, cream, or gel (topical analgesic), such as a numbing medicine. These may cause fewer side effects than medicines taken by mouth. Do certain therapies as directed Some therapies can help with pain management. They include: Physical therapy. You will do exercises to gain strength and flexibility. A physical therapist may teach you exercises to move and stretch parts of your body that are weak, stiff, or painful. You can learn these exercises at physical therapy visits and practice them at home. Physical therapy may also involve: Massage. Heat wraps or applying heat or cold to affected areas. Electrical signals that interrupt pain signals (transcutaneous electrical nerve stimulation, TENS). Weak lasers that reduce pain and swelling (low-level laser therapy). Signals from your body that help you learn to regulate pain (biofeedback). Occupational therapy. This helps you to learn ways to function at home and work with less pain. Recreational therapy. This involves trying new activities or hobbies, such as a physical activity or drawing. Mental health therapy, including: Cognitive behavioral therapy (CBT). This helps you learn coping skills for dealing with pain. Acceptance and commitment therapy (ACT) to change the way you think and react to pain. Relaxation therapies, including muscle relaxation exercises and mindfulness-based stress reduction. Pain management counseling. This may be individual, family, or group counseling.  Receive medical treatments Medical treatments for pain management include: Nerve block injections. These may include a pain blocker and anti-inflammatory medicines. You may have injections: Near the spine to relieve chronic back or neck pain. Into joints to relieve back or joint pain. Into nerve areas  that supply a painful area to relieve body pain. Into muscles (trigger point injections) to relieve some painful muscle conditions. A medical device placed near your spine to help block pain signals and relieve nerve pain or chronic back pain (spinal cord stimulation device). Acupuncture. Follow these instructions at home Medicines Take over-the-counter and prescription medicines only as told by your health care provider. If you are taking pain medicine, ask your health care providers about possible side effects to watch out for. Do not drive or use heavy machinery while taking prescription opioid pain medicine. Lifestyle  Do not use drugs or alcohol  to reduce pain. If you drink alcohol , limit how much you have to: 0-1 drink a day for women who are not pregnant. 0-2 drinks a day for men. Know how much alcohol  is in a drink. In the [place] one drink equals one 12 oz bottle of beer (355 mL), one 5 oz glass of wine (148 mL), or one 1 oz glass of hard liquor (44 mL). Do not use any products that contain nicotine or tobacco. These products include cigarettes, chewing tobacco, and vaping devices, such as e-cigarettes. If you need help quitting, ask your health care provider. Eat a healthy diet and maintain a healthy weight. Poor diet and excess weight may make pain worse. Eat foods that are high in fiber. These include fresh fruits and vegetables, whole grains, and beans. Limit foods that are high in fat and processed sugars, such as fried and sweet foods. Exercise regularly. Exercise lowers stress and may help relieve pain. Ask your health care provider what activities and exercises  are safe for you. If your health care provider approves, join an exercise class that combines movement and stress reduction. Examples include yoga and tai chi. Get enough sleep. Lack of sleep may make pain worse. Lower stress as much as possible. Practice stress reduction techniques as told by your therapist. General  instructions Work with all your pain management providers to find the treatments that work best for you. You are an important member of your pain management team. There are many things you can do to reduce pain on your own. Consider joining an online or in-person support group for people who have chronic pain. Keep all follow-up visits. This is important. Where to find more information You can find more information about managing pain without opioids from: American Academy of Pain Medicine: painmed.org Institute for Chronic Pain: instituteforchronicpain.org American Chronic Pain Association: theacpa.org Contact a health care provider if: You have side effects from pain medicine. Your pain gets worse or does not get better with treatments or home therapy. You are struggling with anxiety or depression. Summary Many types of pain can be managed without opioids. Chronic pain may respond better to pain management without opioids. Pain is best managed when you and a team of health care providers work together. Pain management without opioids may include non-opioid medicines, medical treatments, physical therapy, mental health therapy, and lifestyle changes. Tell your health care providers if your pain gets worse or is not being managed well enough. This information is not intended to replace advice given to you by your health care provider. Make sure you discuss any questions you have with your health care provider. Document Revised: 09/07/2020 Document Reviewed: 09/07/2020 Elsevier Patient Education  2024 [employer].

## 2024-03-19 ENCOUNTER — Other Ambulatory Visit (HOSPITAL_COMMUNITY)
Admission: RE | Admit: 2024-03-19 | Discharge: 2024-03-19 | Disposition: A | Discharge status: Home / Self Care | Source: Ambulatory Visit | Attending: Cardiology | Admitting: Cardiology

## 2024-03-19 DIAGNOSIS — R7303 Prediabetes: Secondary | ICD-10-CM | POA: Diagnosis not present

## 2024-03-19 DIAGNOSIS — I10 Essential (primary) hypertension: Secondary | ICD-10-CM | POA: Diagnosis not present

## 2024-03-19 DIAGNOSIS — E7849 Other hyperlipidemia: Secondary | ICD-10-CM | POA: Diagnosis not present

## 2024-03-19 LAB — BASIC METABOLIC PANEL WITH GFR
Anion gap: 11 (ref 5–15)
BUN: 11 mg/dL (ref 8–23)
CO2: 29 mmol/L (ref 22–32)
Calcium: 9.9 mg/dL (ref 8.9–10.3)
Chloride: 100 mmol/L (ref 98–111)
Creatinine, Ser: 0.72 mg/dL (ref 0.44–1.00)
GFR, Estimated: >60 mL/min (ref >60)
Glucose, Bld: 105 mg/dL — ABNORMAL HIGH (ref 70–99)
Potassium: 3.7 mmol/L (ref 3.5–5.1)
Sodium: 139 mmol/L (ref 135–145)

## 2024-03-19 LAB — LIPID PANEL
Cholesterol: 135 mg/dL (ref 0–200)
HDL: 61 mg/dL (ref >40)
LDL Cholesterol: 60 mg/dL (ref 0–99)
Total CHOL/HDL Ratio: 2.2 ratio
Triglycerides: 70 mg/dL (ref <150)
VLDL: 14 mg/dL (ref 0–40)

## 2024-03-19 LAB — HEPATIC FUNCTION PANEL
ALT: 17 U/L (ref 0–44)
AST: 25 U/L (ref 15–41)
Albumin: 4.1 g/dL (ref 3.5–5.0)
Alkaline Phosphatase: 70 U/L (ref 38–126)
Bilirubin, Direct: 0.4 mg/dL — ABNORMAL HIGH (ref 0.0–0.2)
Indirect Bilirubin: 0.4 mg/dL (ref 0.3–0.9)
Total Bilirubin: 0.8 mg/dL (ref 0.0–1.2)
Total Protein: 7.8 g/dL (ref 6.5–8.1)

## 2024-03-19 LAB — HEMOGLOBIN A1C
Hgb A1c MFr Bld: 5.6 % (ref 4.8–5.6)
Mean Plasma Glucose: 114.02 mg/dL

## 2024-03-23 DIAGNOSIS — K219 Gastro-esophageal reflux disease without esophagitis: Secondary | ICD-10-CM | POA: Diagnosis not present

## 2024-03-23 DIAGNOSIS — D649 Anemia, unspecified: Secondary | ICD-10-CM | POA: Diagnosis not present

## 2024-03-23 DIAGNOSIS — E782 Mixed hyperlipidemia: Secondary | ICD-10-CM | POA: Diagnosis not present

## 2024-03-23 DIAGNOSIS — I10 Essential (primary) hypertension: Secondary | ICD-10-CM | POA: Diagnosis not present

## 2024-03-27 ENCOUNTER — Other Ambulatory Visit: Payer: Self-pay | Admitting: Family Medicine

## 2024-03-27 DIAGNOSIS — I10 Essential (primary) hypertension: Secondary | ICD-10-CM

## 2024-04-01 ENCOUNTER — Ambulatory Visit (INDEPENDENT_AMBULATORY_CARE_PROVIDER_SITE_OTHER): Admitting: Family Medicine

## 2024-04-01 ENCOUNTER — Encounter: Payer: Self-pay | Admitting: Family Medicine

## 2024-04-01 VITALS — BP 130/62 | HR 58 | Temp 98.1°F | Wt 217.0 lb

## 2024-04-01 DIAGNOSIS — S73004D Unspecified dislocation of right hip, subsequent encounter: Secondary | ICD-10-CM | POA: Diagnosis not present

## 2024-04-01 DIAGNOSIS — R7303 Prediabetes: Secondary | ICD-10-CM | POA: Diagnosis not present

## 2024-04-01 DIAGNOSIS — E7800 Pure hypercholesterolemia, unspecified: Secondary | ICD-10-CM

## 2024-04-01 DIAGNOSIS — I10 Essential (primary) hypertension: Secondary | ICD-10-CM

## 2024-04-01 DIAGNOSIS — N3941 Urge incontinence: Secondary | ICD-10-CM

## 2024-04-01 DIAGNOSIS — R12 Heartburn: Secondary | ICD-10-CM | POA: Diagnosis not present

## 2024-04-01 MED ORDER — OXYBUTYNIN CHLORIDE 5 MG PO TABS
5.0000 mg | ORAL_TABLET | Freq: Three times a day (TID) | ORAL | 1 refills | Status: AC
Start: 2024-04-01 — End: ?

## 2024-04-01 MED ORDER — AMLODIPINE BESYLATE 2.5 MG PO TABS: 2.5000 mg | ORAL_TABLET | Freq: Every day | ORAL | 1 refills | Status: AC

## 2024-04-01 MED ORDER — TELMISARTAN-HCTZ 80-25 MG PO TABS: 1.0000 | ORAL_TABLET | Freq: Every day | ORAL | 1 refills | Status: AC

## 2024-04-01 NOTE — Progress Notes (Unsigned)
 Subjective:  Patient ID: Angela Estrada, female    DOB: Feb 11, 1951  Age: 73 y.o. MRN: [id number]  CC:  Chief Complaint  Patient presents with   Medical Management of Chronic Issues    HPI Angela Estrada presents for   GERD Pepcid  1-2 times per day.  Possible esophagitis versus gastritis treated with PPI, Carafate , H2 blocker.  Stable currently with just Pepcid  every day - occasional BID, controlled.   Urge incontinence Treated with Ditropan  twice per day, up to 3 times per day with traveling in the past.  Still doing well with this current medication dosage, some dry mouth in the past, but no new side effects or dizziness currently, no falls. Currently 2 times per day. No new side effects. Effective with BID dosing.   Prediabetes: Diet/exercise approach with prior elevated A1c 0 had cut back on sodas, fast food, improved A1c today, below prediabetes level.  Weight has also improved by few pounds.  Lab Results  Component Value Date   HGBA1C 5.6 03/19/2024   Wt Readings from Last 3 Encounters:  04/01/24 217 lb (98.4 kg)  03/18/24 220 lb (99.8 kg)  03/04/24 220 lb (99.8 kg)   Hyperlipidemia: Pravastatin  in the past, changed to Crestor 20mg  every day..  Seen by cardiology previously.  Denies any myalgias/arthralgias or side effects with current meds.  Recent lipids noted, doing well. Lab Results  Component Value Date   CHOL 135 03/19/2024   HDL 61 03/19/2024   LDLCALC 60 03/19/2024   TRIG 70 03/19/2024   CHOLHDL 2.2 03/19/2024   Lab Results  Component Value Date   ALT 17 03/19/2024   AST 25 03/19/2024   ALKPHOS 70 03/19/2024   BILITOT 0.8 03/19/2024    Hypertension: Amlodipine  2.5 mg daily, Toprol-XL 25 mg daily, telmisartan  HCTZ 80/25 mg daily and potassium 20 mill close twice daily.  Followed by cardiology, recent visit 10/9 with Dr. Levern. No med changes. No new med side effects. No home readings:  BP Readings from Last 3 Encounters:  04/01/24 130/62   03/04/24 (!) 135/58  09/30/23 128/74   Lab Results  Component Value Date   CREATININE 0.72 03/19/2024    ER visit September 24 with right hip dislocation, reduced with sedation and follow-up with Ortho outpatient.  No fracture on imaging and hardware was intact. Has seen ortho. Doing well. Continued monitoring planned, not needing any pain meds.   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   Arthritis of right hip 03/19/2016   Primary osteoarthritis of right hip 03/18/2016   Hypokalemia 03/29/2014   Arthralgia 02/11/2014   Routine general medical examination at a health care facility 03/28/2013   Nonspecific abnormal findings on radiological and examination of lung field 03/27/2013   Cough 03/11/2013   Disorder of liver 03/09/2013   Pyuria 03/09/2013   Arthritis    Ventral hernia 01/18/2012   Encounter for long-term (current) use of other medications 07/05/2011   HYPERGLYCEMIA 08/12/2008   HYPERCHOLESTEROLEMIA 04/25/2007   DIZZINESS AND GIDDINESS 04/25/2007   Essential hypertension 01/03/2007   Allergic rhinitis 01/03/2007   Past Medical History:  Diagnosis Date   Abdominal hernia    RLQ   Allergy    Arthritis    Arthritis    Borderline diabetes  GERD (gastroesophageal reflux disease)    HYPERCHOLESTEROLEMIA 04/25/2007   takes Pravastatin  daily   HYPERTENSION 01/03/2007   takes Losartan -HCTZ  and Amlodipine  daily   Hypokalemia    takes Potassium daily   Joint pain    Nocturia    Pneumonia 2 yrs ago   Primary osteoarthritis of shoulder    Right   Past Surgical History:  Procedure Laterality Date   CESAREAN SECTION  25 years ago   CHOLECYSTECTOMY     COLONOSCOPY     DILATION AND CURETTAGE OF UTERUS  35 years ago   HERNIA REPAIR      INCISIONAL HERNIA REPAIR  02/21/2012   Procedure: LAPAROSCOPIC INCISIONAL HERNIA;  Surgeon: Vicenta DELENA Poli, MD;  Location: [facility] OR;  Service: General;  Laterality: N/A;  laparoscopic incisional hernia repair with mesh   JOINT REPLACEMENT     bilateral knee replacements   REPLACEMENT TOTAL KNEE BILATERAL  2010   TOTAL HIP ARTHROPLASTY Right 03/19/2016   Procedure: RIGHT TOTAL HIP ARTHROPLASTY ANTERIOR APPROACH;  Surgeon: Dempsey Sensor, MD;  Location: [facility] OR;  Service: Orthopedics;  Laterality: Right;   TOTAL HIP REVISION Right 10/02/2017   Procedure: TOTAL HIP REVISION POSTERIOR;  Surgeon: Sensor Dempsey, MD;  Location: [facility] OR;  Service: Orthopedics;  Laterality: Right;   TOTAL HIP REVISION Right 05/23/2020   Procedure: RIGHT TOTAL POSTERIOR HIP REVISION;  Surgeon: Sensor Dempsey, MD;  Location: [facility];  Service: Orthopedics;  Laterality: Right;   TOTAL SHOULDER ARTHROPLASTY Right 12/19/2017   Procedure: RIGHT TOTAL SHOULDER ARTHROPLASTY;  Surgeon: Dozier Soulier, MD;  Location: [facility];  Service: Orthopedics;  Laterality: Right;   TUBAL LIGATION     Allergies  Allergen Reactions   Lisinopril Cough   Oxycodone  Other (See Comments)    Hallucinations   Prior to Admission medications   Medication Sig Start Date End Date Taking? Authorizing Provider  amLODipine  (NORVASC ) 2.5 MG tablet Take 1 tablet (2.5 mg total) by mouth daily. 09/30/23  Yes Levora Reyes SAUNDERS, MD  Apoaequorin (PREVAGEN) 10 MG CAPS Take 10 mg by mouth daily.   Yes [provider]  aspirin  EC 81 MG tablet Take 1 tablet (81 mg total) by mouth 2 (two) times daily. 05/23/20  Yes Orlando Camellia POUR, PA-C  Carboxymethylcellul-Glycerin  (LUBRICATING EYE DROPS OP) Place 1 drop into both eyes daily as needed (dry eyes).   Yes [provider]  cetirizine-pseudoephedrine (ZYRTEC-D) 5-120 MG tablet Take 1 tablet by mouth daily as needed for allergies.   Yes [provider]  famotidine  (PEPCID ) 20 MG tablet TAKE 1 TABLET BY  MOUTH TWICE A DAY 03/27/24  Yes Levora Reyes SAUNDERS, MD  metoprolol succinate (TOPROL-XL) 25 MG 24 hr tablet Take 25 mg by mouth daily. 08/03/20  Yes [provider]  Multiple Vitamin (MULTIVITAMIN WITH MINERALS) TABS tablet Take 1 tablet by mouth daily.   Yes [provider]  oxybutynin  (DITROPAN ) 5 MG tablet Take 1 tablet (5 mg total) by mouth 3 (three) times daily. 09/30/23  Yes Levora Reyes SAUNDERS, MD  potassium chloride  SA (KLOR-CON  M) 20 MEQ tablet TAKE 1 TABLET BY MOUTH TWICE A DAY 03/27/24  Yes Levora Reyes SAUNDERS, MD  rosuvastatin (CRESTOR) 20 MG tablet Take 20 mg by mouth daily.   Yes [provider]  telmisartan -hydrochlorothiazide  (MICARDIS  HCT) 80-25 MG tablet Take 1 tablet by mouth daily. 09/30/23  Yes Levora Reyes SAUNDERS, MD  traMADol  (ULTRAM ) 50 MG tablet Take 1 tablet (50 mg total) by mouth every 6 (six) hours  as needed. 03/04/24  Yes Odell Balls, PA-C   Social History   Socioeconomic History   Marital status: Married    Spouse name: Not on file   Number of children: 1   Years of education: 12th grade +   Highest education level: Not on file  Occupational History   Occupation: Beautician  Tobacco Use   Smoking status: Never   Smokeless tobacco: Never  Vaping Use   Vaping status: Never Used  Substance and Sexual Activity   Alcohol  use: No    Alcohol /week: 0.0 standard drinks of alcohol    Drug use: No   Sexual activity: Not Currently    Partners: Male    Birth control/protection: Post-menopausal    Comment: first intercourse >18 y.o, more than 5  Other Topics Concern   Not on file  Social History Narrative   Lives with her husband.   Adult daughter lives nearby.   Social Drivers of Corporate investment banker Strain: Low Risk  (03/18/2024)   Overall Financial Resource Strain (CARDIA)    Difficulty of Paying Living Expenses: Not hard at all  Food Insecurity: No Food Insecurity (03/18/2024)   Hunger Vital Sign    Worried About Running Out of  Food in the Last Year: Never true    Ran Out of Food in the Last Year: Never true  Transportation Needs: No Transportation Needs (03/18/2024)   PRAPARE - Administrator, Civil Service (Medical): No    Lack of Transportation (Non-Medical): No  Physical Activity: Sufficiently Active (03/18/2024)   Exercise Vital Sign    Days of Exercise per Week: 3 days    Minutes of Exercise per Session: 60 min  Stress: No Stress Concern Present (03/18/2024)   [employer] of Occupational Health - Occupational Stress Questionnaire    Feeling of Stress: Not at all  Social Connections: Socially Integrated (03/18/2024)   Social Connection and Isolation Panel    Frequency of Communication with Friends and Family: More than three times a week    Frequency of Social Gatherings with Friends and Family: Never    Attends Religious Services: More than 4 times per year    Active Member of [employer] or Organizations: Yes    Attends Engineer, structural: More than 4 times per year    Marital Status: Married  Catering manager Violence: Not At Risk (03/18/2024)   Humiliation, Afraid, Rape, and Kick questionnaire    Fear of Current or Ex-Partner: No    Emotionally Abused: No    Physically Abused: No    Sexually Abused: No    Review of Systems  Constitutional:  Negative for fatigue and unexpected weight change.  Respiratory:  Negative for chest tightness and shortness of breath.   Cardiovascular:  Negative for chest pain, palpitations and leg swelling.  Gastrointestinal:  Negative for abdominal pain and blood in stool.  Neurological:  Negative for dizziness, syncope, light-headedness and headaches.    Objective:   Vitals:   04/01/24 1031  BP: 130/62  Pulse: (!) 58  Temp: 98.1 F (36.7 C)  SpO2: 94%  Weight: 217 lb (98.4 kg)     Physical Exam Vitals reviewed.  Constitutional:      Appearance: Normal appearance. She is well-developed.  HENT:     Head: Normocephalic and atraumatic.   Eyes:     Conjunctiva/sclera: Conjunctivae normal.     Pupils: Pupils are equal, round, and reactive to light.  Neck:     Vascular: No carotid  bruit.  Cardiovascular:     Rate and Rhythm: Normal rate and regular rhythm.     Heart sounds: Normal heart sounds.  Pulmonary:     Effort: Pulmonary effort is normal.     Breath sounds: Normal breath sounds.  Abdominal:     Palpations: Abdomen is soft. There is no pulsatile mass.     Tenderness: There is no abdominal tenderness.  Musculoskeletal:     Right lower leg: Edema (trace bilat) present.     Left lower leg: Edema present.  Skin:    General: Skin is warm and dry.  Neurological:     Mental Status: She is alert and oriented to person, place, and time.  Psychiatric:        Mood and Affect: Mood normal.        Behavior: Behavior normal.     Assessment & Plan:  Angela Estrada is a 73 y.o. female . Prediabetes  Essential hypertension - Plan: amLODipine  (NORVASC ) 2.5 MG tablet, telmisartan -hydrochlorothiazide  (MICARDIS  HCT) 80-25 MG tablet  Urge incontinence - Plan: oxybutynin  (DITROPAN ) 5 MG tablet  Pure hypercholesterolemia  Heartburn   Meds ordered this encounter  Medications   amLODipine  (NORVASC ) 2.5 MG tablet    Sig: Take 1 tablet (2.5 mg total) by mouth daily.    Dispense:  90 tablet    Refill:  1   telmisartan -hydrochlorothiazide  (MICARDIS  HCT) 80-25 MG tablet    Sig: Take 1 tablet by mouth daily.    Dispense:  90 tablet    Refill:  1   oxybutynin  (DITROPAN ) 5 MG tablet    Sig: Take 1 tablet (5 mg total) by mouth 3 (three) times daily.    Dispense:  270 tablet    Refill:  1   Patient Instructions  Thank you for coming in today.  Recent labs look great!  No medication changes at this time.  Keep up the good work.  I will see you in 6 months for a physical but let me know if there are any questions in the meantime.  Take care!    Signed,   Reyes Pines, MD [facility], [facility] 04/01/24 11:33 AM

## 2024-04-01 NOTE — Patient Instructions (Signed)
 Thank you for coming in today.  Recent labs look great!  No medication changes at this time.  Keep up the good work.  I will see you in 6 months for a physical but let me know if there are any questions in the meantime.  Take care!

## 2024-04-02 ENCOUNTER — Other Ambulatory Visit: Payer: Self-pay | Admitting: Family Medicine

## 2024-04-02 DIAGNOSIS — I10 Essential (primary) hypertension: Secondary | ICD-10-CM

## 2024-04-02 NOTE — Telephone Encounter (Signed)
 Copied from CRM [id number]. Topic: Clinical - Medication Refill >> Apr 02, 2024 10:07 AM Nessti S wrote: Medication: amLODipine  (NORVASC ) 2.5 MG tablet  Has the patient contacted their pharmacy? Yes (Agent: If no, request that the patient contact the pharmacy for the refill. If patient does not wish to contact the pharmacy document the reason why and proceed with request.) (Agent: If yes, when and what did the pharmacy advise?)  This is the patient's preferred pharmacy:  [id number] - [place],  - [address] AT [facility] [address] [place] [address] Phone: [phone] Fax: [phone]  Is this the correct pharmacy for this prescription? Yes If no, delete pharmacy and type the correct one.   Has the prescription been filled recently? No  Is the patient out of the medication? Yes  Has the patient been seen for an appointment in the last year OR does the patient have an upcoming appointment? Yes  Can we respond through MyChart? No  Agent: Please be advised that Rx refills may take up to 3 business days. We ask that you follow-up with your pharmacy.

## 2024-07-02 ENCOUNTER — Encounter (HOSPITAL_COMMUNITY): Payer: Self-pay

## 2024-07-02 ENCOUNTER — Other Ambulatory Visit: Payer: Self-pay

## 2024-07-02 ENCOUNTER — Emergency Department (HOSPITAL_COMMUNITY)

## 2024-07-02 ENCOUNTER — Emergency Department (HOSPITAL_COMMUNITY): Admission: EM | Admit: 2024-07-02 | Discharge: 2024-07-02 | Disposition: A | Discharge status: Home / Self Care

## 2024-07-02 DIAGNOSIS — Y792 Prosthetic and other implants, materials and accessory orthopedic devices associated with adverse incidents: Secondary | ICD-10-CM | POA: Insufficient documentation

## 2024-07-02 DIAGNOSIS — Z96641 Presence of right artificial hip joint: Secondary | ICD-10-CM | POA: Diagnosis not present

## 2024-07-02 DIAGNOSIS — Z79899 Other long term (current) drug therapy: Secondary | ICD-10-CM | POA: Diagnosis not present

## 2024-07-02 DIAGNOSIS — S73004A Unspecified dislocation of right hip, initial encounter: Secondary | ICD-10-CM

## 2024-07-02 DIAGNOSIS — S79911A Unspecified injury of right hip, initial encounter: Secondary | ICD-10-CM | POA: Diagnosis present

## 2024-07-02 DIAGNOSIS — T84020A Dislocation of internal right hip prosthesis, initial encounter: Secondary | ICD-10-CM | POA: Insufficient documentation

## 2024-07-02 LAB — I-STAT CHEM 8, ED
BUN: 19 mg/dL (ref 8–23)
Calcium, Ion: 1.13 mmol/L — ABNORMAL LOW (ref 1.15–1.40)
Chloride: 101 mmol/L (ref 98–111)
Creatinine, Ser: 0.8 mg/dL (ref 0.44–1.00)
Glucose, Bld: 99 mg/dL (ref 70–99)
HCT: 47 % — ABNORMAL HIGH (ref 36.0–46.0)
Hemoglobin: 16 g/dL — ABNORMAL HIGH (ref 12.0–15.0)
Potassium: 3.8 mmol/L (ref 3.5–5.1)
Sodium: 137 mmol/L (ref 135–145)
TCO2: 24 mmol/L (ref 22–32)

## 2024-07-02 LAB — CBC WITH DIFFERENTIAL/PLATELET
Abs Immature Granulocytes: 0.02 K/uL (ref 0.00–0.07)
Basophils Absolute: 0 K/uL (ref 0.0–0.1)
Basophils Relative: 1 %
Eosinophils Absolute: 0.1 K/uL (ref 0.0–0.5)
Eosinophils Relative: 2 %
HCT: 42.4 % (ref 36.0–46.0)
Hemoglobin: 14.1 g/dL (ref 12.0–15.0)
Immature Granulocytes: 0 %
Lymphocytes Relative: 21 %
Lymphs Abs: 1.5 K/uL (ref 0.7–4.0)
MCH: 29.9 pg (ref 26.0–34.0)
MCHC: 33.3 g/dL (ref 30.0–36.0)
MCV: 89.8 fL (ref 80.0–100.0)
Monocytes Absolute: 0.7 K/uL (ref 0.1–1.0)
Monocytes Relative: 10 %
Neutro Abs: 4.9 K/uL (ref 1.7–7.7)
Neutrophils Relative %: 66 %
Platelets: 250 K/uL (ref 150–400)
RBC: 4.72 MIL/uL (ref 3.87–5.11)
RDW: 14.3 % (ref 11.5–15.5)
WBC: 7.3 K/uL (ref 4.0–10.5)
nRBC: 0 % (ref 0.0–0.2)

## 2024-07-02 MED ORDER — TRAMADOL HCL 50 MG PO TABS: 50.0000 mg | ORAL_TABLET | Freq: Four times a day (QID) | ORAL | 0 refills | Status: AC | PRN

## 2024-07-02 MED ORDER — MORPHINE SULFATE (PF) 2 MG/ML IV SOLN
2.0000 mg | Freq: Once | INTRAVENOUS | Status: AC
  Administered 2024-07-02: 2 mg via INTRAVENOUS
  Filled 2024-07-02: qty 1

## 2024-07-02 MED ORDER — PROPOFOL 10 MG/ML IV BOLUS
INTRAVENOUS | Status: AC | PRN
  Administered 2024-07-02 (×2): 49.2 mg via INTRAVENOUS

## 2024-07-02 MED ORDER — PROPOFOL 10 MG/ML IV BOLUS
0.5000 mg/kg | Freq: Once | INTRAVENOUS | Status: AC
  Administered 2024-07-02: 49.2 mg via INTRAVENOUS
  Filled 2024-07-02: qty 20

## 2024-07-02 NOTE — ED Provider Notes (Signed)
 " [facility]ERGENCY DEPARTMENT AT [facility] Provider Note   CSN: [id number] Arrival date & time: 07/02/24  1528     Patient presents with: Hip Injury   Angela Estrada is a 74 y.o. female.   HPI   Presents because of right hip pain.  Patient states that she was put in a close whenever she felt a pop and subsequently developed right hip pain.  Did not fall.  Was able to guide her self to the ground.  No loss of conscious.  Did not hit her head.  Endorses history of right hip replacement.  History of dislocation of this right hip in the past.  Denies any numbness or tingling distal to the injury site.  Previous medical history reviewed : Patient's last seen in the ED with hip injury.  Dislocated right hip.  Reduced in the ED.     Prior to Admission medications  Medication Sig Start Date End Date Taking? Authorizing Provider  traMADol  (ULTRAM ) 50 MG tablet Take 1 tablet (50 mg total) by mouth every 6 (six) hours as needed. 07/02/24  Yes Simon Lavonia SAILOR, MD  amLODipine  (NORVASC ) 2.5 MG tablet Take 1 tablet (2.5 mg total) by mouth daily. 04/01/24   Levora Reyes SAUNDERS, MD  Apoaequorin (PREVAGEN) 10 MG CAPS Take 10 mg by mouth daily.    [provider]  aspirin  EC 81 MG tablet Take 1 tablet (81 mg total) by mouth 2 (two) times daily. 05/23/20   Orlando Camellia POUR, PA-C  Carboxymethylcellul-Glycerin  (LUBRICATING EYE DROPS OP) Place 1 drop into both eyes daily as needed (dry eyes).    [provider]  cetirizine-pseudoephedrine (ZYRTEC-D) 5-120 MG tablet Take 1 tablet by mouth daily as needed for allergies.    [provider]  famotidine  (PEPCID ) 20 MG tablet TAKE 1 TABLET BY MOUTH TWICE A DAY 03/27/24   Levora Reyes SAUNDERS, MD  metoprolol succinate (TOPROL-XL) 25 MG 24 hr tablet Take 25 mg by mouth daily. 08/03/20   [provider]  Multiple Vitamin (MULTIVITAMIN WITH MINERALS) TABS tablet Take 1 tablet by mouth daily.    [provider]   oxybutynin  (DITROPAN ) 5 MG tablet Take 1 tablet (5 mg total) by mouth 3 (three) times daily. 04/01/24   Levora Reyes SAUNDERS, MD  potassium chloride  SA (KLOR-CON  M) 20 MEQ tablet TAKE 1 TABLET BY MOUTH TWICE A DAY 03/27/24   Levora Reyes SAUNDERS, MD  rosuvastatin (CRESTOR) 20 MG tablet Take 20 mg by mouth daily.    [provider]  telmisartan -hydrochlorothiazide  (MICARDIS  HCT) 80-25 MG tablet Take 1 tablet by mouth daily. 04/01/24   Levora Reyes SAUNDERS, MD  traMADol  (ULTRAM ) 50 MG tablet Take 1 tablet (50 mg total) by mouth every 6 (six) hours as needed. 03/04/24   Odell Balls, PA-C    Allergies: Lisinopril and Oxycodone     Review of Systems  Constitutional:  Negative for chills and fever.  HENT:  Negative for ear pain and sore throat.   Eyes:  Negative for pain and visual disturbance.  Respiratory:  Negative for cough and shortness of breath.   Cardiovascular:  Negative for chest pain and palpitations.  Gastrointestinal:  Negative for abdominal pain and vomiting.  Genitourinary:  Negative for dysuria and hematuria.  Musculoskeletal:  Negative for arthralgias and back pain.  Skin:  Negative for color change and rash.  Neurological:  Negative for seizures and syncope.  All other systems reviewed and are negative.   Updated Vital Signs BP ROLLEN)  140/75   Pulse 65   Temp 97.6 F (36.4 C) (Oral)   Resp 16   Ht 5' 1.5 (1.562 m)   Wt 98.4 kg   LMP 08/30/1998   SpO2 96%   BMI 40.33 kg/m   Physical Exam Vitals and nursing note reviewed.  Constitutional:      General: She is not in acute distress.    Appearance: She is well-developed.  HENT:     Head: Normocephalic and atraumatic.  Eyes:     Conjunctiva/sclera: Conjunctivae normal.  Cardiovascular:     Rate and Rhythm: Normal rate and regular rhythm.     Heart sounds: No murmur heard. Pulmonary:     Effort: Pulmonary effort is normal. No respiratory distress.     Breath sounds: Normal breath sounds.  Abdominal:      Palpations: Abdomen is soft.     Tenderness: There is no abdominal tenderness.  Musculoskeletal:        General: No swelling.     Cervical back: Neck supple.       Legs:  Skin:    General: Skin is warm and dry.     Capillary Refill: Capillary refill takes less than 2 seconds.  Neurological:     Mental Status: She is alert.  Psychiatric:        Mood and Affect: Mood normal.     (all labs ordered are listed, but only abnormal results are displayed) Labs Reviewed  I-STAT CHEM 8, ED - Abnormal; Notable for the following components:      Result Value   Calcium, Ion 1.13 (*)    Hemoglobin 16.0 (*)    HCT 47.0 (*)    All other components within normal limits  CBC WITH DIFFERENTIAL/PLATELET    EKG: EKG Interpretation Date/Time:  Thursday July 02 2024 16:34:50 EST Ventricular Rate:  70 PR Interval:  203 QRS Duration:  107 QT Interval:  409 QTC Calculation: 442 R Axis:   55  Text Interpretation: Sinus rhythm Abnormal R-wave progression, early transition Probable left ventricular hypertrophy Confirmed by Simon Rea [phone]) on 07/02/2024 5:02:19 PM  Radiology: [facility] Hip [place] or [place] Pelvis 1 View Right Result Date: 07/02/2024 EXAM: 1 VIEW(S) XRAY OF THE RIGHT HIP 07/02/2024 06:10:00 PM COMPARISON: 07/02/2024 CLINICAL HISTORY: Reduction of hip verification. FINDINGS: BONES AND JOINTS: Right total hip arthroplasty in place. No fracture seen. Interval reduction of right hip dislocation. SOFT TISSUES: Unremarkable. IMPRESSION: 1. Right hip arthroplasty is now in anatomic alignment. Electronically signed by: Greig Pique MD 07/02/2024 06:13 PM EST RP Workstation: [id number]   DG Hip Unilat W or Wo Pelvis 2-3 Views Right Result Date: 07/02/2024 CLINICAL DATA:  Right hip injury. EXAM: DG HIP (WITH OR WITHOUT PELVIS) 2-3V RIGHT COMPARISON:  03/04/2024 FINDINGS: Exam demonstrates evidence of right total hip arthroplasty with superior dislocation of the femoral component of the  arthroplasty. No acute fracture. Moderate degenerative change of the spine. IMPRESSION: Superior dislocation of the femoral component of the right hip arthroplasty. No acute fracture. Electronically Signed   By: Toribio Agreste M.D.   On: 07/02/2024 16:58     .Sedation  Date/Time: 07/02/2024 9:04 PM  Performed by: Simon Rea SAILOR, MD Authorized by: Simon Rea SAILOR, MD   Consent:    Consent obtained:  Verbal and written   Consent given by:  Patient   Risks discussed:  Allergic reaction, prolonged hypoxia resulting in organ damage, prolonged sedation necessitating reversal, respiratory compromise necessitating ventilatory assistance and intubation, vomiting, nausea,  inadequate sedation and dysrhythmia   Alternatives discussed:  Analgesia without sedation and anxiolysis Universal protocol:    Immediately prior to procedure, a time out was called: yes     Patient identity confirmed:  Arm band Indications:    Procedure performed:  Dislocation reduction   Procedure necessitating sedation performed by:  Physician performing sedation Pre-sedation assessment:    Time since last food or drink:  6 hours   ASA classification: class 2 - patient with mild systemic disease     Mouth opening:  2 finger widths   Thyromental distance:  3 finger widths   Mallampati score:  II - soft palate, uvula, fauces visible   Neck mobility: normal     Pre-sedation assessments completed and reviewed: airway patency, cardiovascular function, hydration status, mental status, nausea/vomiting, pain level, respiratory function and temperature   A pre-sedation assessment was completed prior to the start of the procedure Immediate pre-procedure details:    Reassessment: Patient reassessed immediately prior to procedure     Reviewed: vital signs, relevant labs/tests and NPO status     Verified: bag valve mask available, emergency equipment available, intubation equipment available, IV patency confirmed, oxygen available and  suction available   Procedure details (see MAR for exact dosages):    Preoxygenation:  Nasal cannula   Sedation:  Propofol    Intended level of sedation: deep   Intra-procedure monitoring:  Blood pressure monitoring, cardiac monitor, frequent vital sign checks, frequent LOC assessments, continuous capnometry and continuous pulse oximetry   Intra-procedure events: none     Total Provider sedation time (minutes):  15 Post-procedure details:   A post-sedation assessment was completed following the completion of the procedure.   Attendance: Constant attendance by certified staff until patient recovered     Recovery: Patient returned to pre-procedure baseline     Patient is stable for discharge or admission: yes     Procedure completion:  Tolerated well, no immediate complications    Medications Ordered in the ED  morphine  (PF) 2 MG/ML injection 2 mg (2 mg Intravenous Given 07/02/24 1649)  propofol  (DIPRIVAN ) 10 mg/mL bolus/IV push 49.2 mg (49.2 mg Intravenous Given 07/02/24 1733)  propofol  (DIPRIVAN ) 10 mg/mL bolus/IV push (49.2 mg Intravenous Given 07/02/24 1801)                                    Medical Decision Making Amount and/or Complexity of Data Reviewed Labs: ordered. Radiology: ordered.  Risk Prescription drug management.     HPI:  Presents because of right hip pain.  Patient states that she was put in a close whenever she felt a pop and subsequently developed right hip pain.  Did not fall.  Was able to guide her self to the ground.  No loss of conscious.  Did not hit her head.  Endorses history of right hip replacement.  History of dislocation of this right hip in the past.  Denies any numbness or tingling distal to the injury site.  Previous medical history reviewed : Patient's last seen in the ED with hip injury.  Dislocated right hip.  Reduced in the ED.     MDM:   Upon examination, patient hemodynamically stable. A&O x 3 with GCS 15.  Shortening of the right  lower extremity as well as internal rotation.  Likely in setting of dislocation.  Will obtain x-ray to confirm.  Neurovascular intact distal to the injury site.  2+ dorsal pedal and posterior tibial pulses.  Sensation intact in this extremity.  No signs of trauma to the head.  No indication for CT imaging.  Will obtain basic laboratory workup as well as EKG for probable sedation.  Reevaluation:   Patient medically clear for sedation.  Labs unremarkable.  EKG normal.  Sedation as above.  Tolerated procedure well.  Reduction of dislocation.  Neurovascular intact distal to injury site after reduction.  Started patient on some small dose pain medication at home.  Straight leg immobilizer applied.  Will follow-up with orthopedic surgery outpatient.   Interventions: sedation/reduction  EKG Interpreted by Me: sinus     I have independently interpreted the XR  images and agree with the radiologist finding      Disposition and Follow Up: ortho      Final diagnoses:  Hip dislocation, right, initial encounter [facility])    ED Discharge Orders          Ordered    traMADol  (ULTRAM ) 50 MG tablet  Every 6 hours PRN        07/02/24 1909               Simon Lavonia SAILOR, MD 07/02/24 2106  "

## 2024-07-02 NOTE — Discharge Instructions (Addendum)
 For pain, you can take 1000 mg of Tylenol  or 1 g of Tylenol  every 6-8 hours.  Do not exceed more than 4000 mg or 4 g in a 24-hour period.  Keep the brace on over the weekend.  I did call in tramadol .  Do not take this other sedative medication.  Do not take this and drive.  You have incontinence or tingling come back to ED.  Follow-up with orthopedic surgeon.

## 2024-07-02 NOTE — ED Triage Notes (Signed)
 Pt arrived via [facility] from home after Pt reports her right hip popped out while she was bending over to put socks in a drawer. Pt reports this has happened once before as well and reports previous hip surgery.

## 2024-07-02 NOTE — ED Notes (Signed)
 Pt provided walker to ambulate independently to restroom. EDP Notified.

## 2024-09-30 ENCOUNTER — Encounter: Admitting: Family Medicine

## 2025-03-23 ENCOUNTER — Ambulatory Visit
